# Patient Record
Sex: Male | Born: 1960 | Race: Black or African American | Hispanic: No | Marital: Single | Smoking: Former smoker
Health system: Southern US, Community
[De-identification: ages and names within clinical notes are randomized; demographics above are authoritative.]

## PROBLEM LIST (undated history)

## (undated) DIAGNOSIS — I739 Peripheral vascular disease, unspecified: Secondary | ICD-10-CM

## (undated) DIAGNOSIS — E1151 Type 2 diabetes mellitus with diabetic peripheral angiopathy without gangrene: Secondary | ICD-10-CM

## (undated) DIAGNOSIS — K219 Gastro-esophageal reflux disease without esophagitis: Secondary | ICD-10-CM

## (undated) DIAGNOSIS — E785 Hyperlipidemia, unspecified: Secondary | ICD-10-CM

## (undated) DIAGNOSIS — E119 Type 2 diabetes mellitus without complications: Secondary | ICD-10-CM

## (undated) DIAGNOSIS — E1159 Type 2 diabetes mellitus with other circulatory complications: Secondary | ICD-10-CM

## (undated) DIAGNOSIS — F17200 Nicotine dependence, unspecified, uncomplicated: Secondary | ICD-10-CM

## (undated) DIAGNOSIS — I70219 Atherosclerosis of native arteries of extremities with intermittent claudication, unspecified extremity: Secondary | ICD-10-CM

## (undated) DIAGNOSIS — I10 Essential (primary) hypertension: Secondary | ICD-10-CM

## (undated) DIAGNOSIS — S78111A Complete traumatic amputation at level between right hip and knee, initial encounter: Secondary | ICD-10-CM

## (undated) DIAGNOSIS — J45909 Unspecified asthma, uncomplicated: Secondary | ICD-10-CM

## (undated) DIAGNOSIS — Z8719 Personal history of other diseases of the digestive system: Secondary | ICD-10-CM

## (undated) HISTORY — DX: Atherosclerosis of native arteries of extremities with intermittent claudication, unspecified extremity: I70.219

## (undated) HISTORY — DX: Type 2 diabetes mellitus with diabetic peripheral angiopathy without gangrene: E11.51

## (undated) HISTORY — PX: COLONOSCOPY: SHX5424

## (undated) HISTORY — DX: Nicotine dependence, unspecified, uncomplicated: F17.200

## (undated) HISTORY — DX: Hyperlipidemia, unspecified: E78.5

## (undated) HISTORY — DX: Peripheral vascular disease, unspecified: I73.9

## (undated) HISTORY — DX: Type 2 diabetes mellitus with other circulatory complications: E11.59

## (undated) HISTORY — DX: Essential (primary) hypertension: I10

## (undated) HISTORY — DX: Type 2 diabetes mellitus without complications: E11.9

---

## 1997-10-16 ENCOUNTER — Emergency Department (HOSPITAL_COMMUNITY): Admission: EM | Admit: 1997-10-16 | Discharge: 1997-10-16 | Payer: Self-pay | Admitting: Emergency Medicine

## 2010-09-22 ENCOUNTER — Emergency Department (HOSPITAL_COMMUNITY): Payer: Self-pay

## 2010-09-22 ENCOUNTER — Emergency Department (HOSPITAL_COMMUNITY)
Admission: EM | Admit: 2010-09-22 | Discharge: 2010-09-22 | Disposition: A | Discharge status: Home / Self Care | Payer: Self-pay | Attending: Emergency Medicine | Admitting: Emergency Medicine

## 2010-09-22 DIAGNOSIS — I10 Essential (primary) hypertension: Secondary | ICD-10-CM | POA: Insufficient documentation

## 2010-09-22 DIAGNOSIS — R05 Cough: Secondary | ICD-10-CM | POA: Insufficient documentation

## 2010-09-22 DIAGNOSIS — R059 Cough, unspecified: Secondary | ICD-10-CM | POA: Insufficient documentation

## 2010-09-22 DIAGNOSIS — J189 Pneumonia, unspecified organism: Secondary | ICD-10-CM | POA: Insufficient documentation

## 2010-09-22 LAB — DIFFERENTIAL
Basophils Absolute: 0 10*3/uL (ref 0.0–0.1)
Basophils Relative: 1 % (ref 0–1)
Eosinophils Absolute: 0.1 10*3/uL (ref 0.0–0.7)
Lymphs Abs: 2 10*3/uL (ref 0.7–4.0)
Neutrophils Relative %: 57 % (ref 43–77)

## 2010-09-22 LAB — BASIC METABOLIC PANEL
CO2: 24 mEq/L (ref 19–32)
Chloride: 101 mEq/L (ref 96–112)
GFR calc Af Amer: >60 mL/min (ref >60)
Potassium: 3.7 mEq/L (ref 3.5–5.1)
Sodium: 136 mEq/L (ref 135–145)

## 2010-09-22 LAB — CBC
Platelets: 206 10*3/uL (ref 150–400)
RBC: 5.04 MIL/uL (ref 4.22–5.81)
WBC: 6.3 10*3/uL (ref 4.0–10.5)

## 2012-03-27 ENCOUNTER — Encounter (HOSPITAL_COMMUNITY): Payer: Self-pay | Admitting: *Deleted

## 2012-03-27 ENCOUNTER — Emergency Department (HOSPITAL_COMMUNITY)
Admission: EM | Admit: 2012-03-27 | Discharge: 2012-03-27 | Disposition: A | Discharge status: Home / Self Care | Payer: Self-pay | Attending: Emergency Medicine | Admitting: Emergency Medicine

## 2012-03-27 DIAGNOSIS — IMO0001 Reserved for inherently not codable concepts without codable children: Secondary | ICD-10-CM | POA: Insufficient documentation

## 2012-03-27 DIAGNOSIS — M79609 Pain in unspecified limb: Secondary | ICD-10-CM | POA: Insufficient documentation

## 2012-03-27 DIAGNOSIS — F17200 Nicotine dependence, unspecified, uncomplicated: Secondary | ICD-10-CM | POA: Insufficient documentation

## 2012-03-27 DIAGNOSIS — R7309 Other abnormal glucose: Secondary | ICD-10-CM | POA: Insufficient documentation

## 2012-03-27 DIAGNOSIS — I10 Essential (primary) hypertension: Secondary | ICD-10-CM | POA: Insufficient documentation

## 2012-03-27 HISTORY — DX: Essential (primary) hypertension: I10

## 2012-03-27 LAB — URINALYSIS, ROUTINE W REFLEX MICROSCOPIC
Glucose, UA: 100 mg/dL — AB
Leukocytes, UA: NEGATIVE
Nitrite: NEGATIVE
pH: 5 (ref 5.0–8.0)

## 2012-03-27 LAB — POCT I-STAT, CHEM 8
BUN: 10 mg/dL (ref 6–23)
Chloride: 103 mEq/L (ref 96–112)
HCT: 49 % (ref 39.0–52.0)
Sodium: 138 mEq/L (ref 135–145)
TCO2: 27 mmol/L (ref 0–100)

## 2012-03-27 MED ORDER — ENALAPRIL MALEATE 5 MG PO TABS: 5.0000 mg | ORAL_TABLET | Freq: Every day | ORAL | Status: DC

## 2012-03-27 MED ORDER — HYDROCHLOROTHIAZIDE 25 MG PO TABS: 12.5000 mg | ORAL_TABLET | Freq: Every day | ORAL | Status: DC

## 2012-03-27 NOTE — ED Notes (Addendum)
Reports pain to bilateral lower legs and feet x 3 weeks. Denies injury to legs or feet, denies swelling. No acute distress noted at triage. Pt hypertensive at triage, bp 217/129, reports being told about htn in past but does not take any meds.

## 2012-03-27 NOTE — ED Provider Notes (Signed)
History     CSN: [id number]  Arrival date & time 03/27/12  1009   First MD Initiated Contact with Patient 03/27/12 1124      Chief Complaint  Patient presents with  . Foot Pain  . Leg Pain  . Hypertension    (Consider location/radiation/quality/duration/timing/severity/associated sxs/prior treatment) HPI Complains of bilateral foot pain for 3 weeks. Pain occurs when he stands on his feet for prolonged period of time. An improves when he takes his weight off of his feet. Pain some times radiates to both distal legs. He treats himself with an aspirin with relief. He is presently asymptomatic. He denies chest pain denies shortness of breath denies abdominal pain or headaches no other associated symptoms.    Past Medical History  Diagnosis Date  . Hypertension     History reviewed. No pertinent past surgical history. Surgical history negative History reviewed. No pertinent family history.  History  Substance Use Topics  . Smoking status: Not on file  . Smokeless tobacco: Not on file  . Alcohol Use: Yes     Comment: occ   Positive smoker positive alcohol denies drug use   Review of Systems  Constitutional: Negative.   HENT: Negative.   Respiratory: Negative.   Cardiovascular: Negative.   Gastrointestinal: Negative.   Musculoskeletal: Positive for myalgias and arthralgias.       Bilateral lower extremity pain  Skin: Negative.   Neurological: Negative.   Psychiatric/Behavioral: Negative.   All other systems reviewed and are negative.    Allergies  Review of patient's allergies indicates no known allergies.  Home Medications  No current outpatient prescriptions on file.  BP 217/129  Pulse 85  Temp(Src) 97.4 F (36.3 C) (Oral)  Resp 16  SpO2 97%  Physical Exam  Nursing note and vitals reviewed. Constitutional: He is oriented to person, place, and time. He appears well-developed and well-nourished.  HENT:  Head: Normocephalic and atraumatic.  Eyes:  Conjunctivae are normal. Pupils are equal, round, and reactive to light.  Neck: Neck supple. No tracheal deviation present. No thyromegaly present.  Cardiovascular: Normal rate and regular rhythm.   No murmur heard. Pulmonary/Chest: Effort normal and breath sounds normal.  Abdominal: Soft. Bowel sounds are normal. He exhibits no distension. There is no tenderness.  OBese  Musculoskeletal: Normal range of motion. He exhibits no edema and no tenderness.  All 4 extremities without redness swelling or tenderness neurovascularly intact  Neurological: He is alert and oriented to person, place, and time. No cranial nerve deficit. Coordination normal.  Skin: Skin is warm and dry. No rash noted.  Psychiatric: He has a normal mood and affect.    ED Course  Procedures (including critical care time)  Labs Reviewed - No data to display No results found.   No diagnosis found. Results for orders placed during the hospital encounter of 03/27/12  URINALYSIS, ROUTINE W REFLEX MICROSCOPIC      Result Value Range   Color, Urine YELLOW  YELLOW   APPearance CLEAR  CLEAR   Specific Gravity, Urine 1.019  1.005 - 1.030   pH 5.0  5.0 - 8.0   Glucose, UA 100 (*) NEGATIVE mg/dL   Hgb urine dipstick NEGATIVE  NEGATIVE   Bilirubin Urine NEGATIVE  NEGATIVE   Ketones, ur NEGATIVE  NEGATIVE mg/dL   Protein, ur NEGATIVE  NEGATIVE mg/dL   Urobilinogen, UA 0.2  0.0 - 1.0 mg/dL   Nitrite NEGATIVE  NEGATIVE   Leukocytes, UA NEGATIVE  NEGATIVE  POCT I-STAT, CHEM  8      Result Value Range   Sodium 138  135 - 145 mEq/L   Potassium 4.0  3.5 - 5.1 mEq/L   Chloride 103  96 - 112 mEq/L   BUN 10  6 - 23 mg/dL   Creatinine, Ser 1.61  0.50 - 1.35 mg/dL   Glucose, Bld 096 (*) 70 - 99 mg/dL   Calcium, Ion 0.45  4.09 - 1.23 mmol/L   TCO2 27  0 - 100 mmol/L   Hemoglobin 16.7  13.0 - 17.0 g/dL   HCT 81.1  91.4 - 78.2 %   No results found.   1:30 PM patient resting comfortably. MDM  There is no evidence of  hypertensive emergency and patient is asymptomatic from his hypertension.  History pain and leg pain this seemed musculoskeletal in etiology. Plan suggest to insert some and podiatry referral Dr. Charlsie Merles Referral resource guide to get a primary care physician Smoking cessation encouraged Spent 5 minutes counseling person on smoking cessation Prescription enalapril, HCTZ Diagnosis #1 bilateral foot pain #2 hypertension #3 hyperglycemia #4 tobacco abuse        Doug Sou, MD 03/27/12 1343

## 2012-04-04 ENCOUNTER — Emergency Department (INDEPENDENT_AMBULATORY_CARE_PROVIDER_SITE_OTHER)
Admission: EM | Admit: 2012-04-04 | Discharge: 2012-04-04 | Disposition: A | Discharge status: Home / Self Care | Payer: Self-pay | Source: Home / Self Care

## 2012-04-04 ENCOUNTER — Encounter (HOSPITAL_COMMUNITY): Payer: Self-pay

## 2012-04-04 DIAGNOSIS — I10 Essential (primary) hypertension: Secondary | ICD-10-CM

## 2012-04-04 NOTE — ED Notes (Signed)
Hospital follow up- hypertension

## 2012-04-04 NOTE — ED Provider Notes (Signed)
History     CSN: [id number]  Arrival date & time 04/04/12  1507   First MD Initiated Contact with Patient 04/04/12 1515      Chief Complaint  Patient presents with  . Follow-up    (Consider location/radiation/quality/duration/timing/severity/associated sxs/prior treatment) HPI Patient is 52 year old male who presents for regular followup and would like to have refill on blood pressure medicine. He explains he has been taking medicine as prescribed and reports checking blood pressure regularly every day. He explains it usually blood pressure is lower than 140/90. He denies chest pain or shortness of breath, no headaches or visual changes, no neck stiffness, no other systemic concerns, no specific focal neurological weakness.  Past Medical History  Diagnosis Date  . Hypertension     No past surgical history on file.  No significant family medical history  History  Substance Use Topics  . Smoking status: Not on file  . Smokeless tobacco: Not on file  . Alcohol Use: Yes     Comment: occ      Review of Systems Constitutional: Negative for fever, chills, diaphoresis, activity change, appetite change and fatigue.  HENT: Negative for ear pain, nosebleeds, congestion, facial swelling, rhinorrhea, neck pain, neck stiffness and ear discharge.   Eyes: Negative for pain, discharge, redness, itching and visual disturbance.  Respiratory: Negative for cough, choking, chest tightness, shortness of breath, wheezing and stridor.   Cardiovascular: Negative for chest pain, palpitations and leg swelling.  Gastrointestinal: Negative for abdominal distention.  Genitourinary: Negative for dysuria, urgency, frequency, hematuria, flank pain, decreased urine volume, difficulty urinating and dyspareunia.  Musculoskeletal: Negative for back pain, joint swelling, arthralgias and gait problem.  Neurological: Negative for dizziness, tremors, seizures, syncope, facial asymmetry, speech difficulty,  weakness, light-headedness, numbness and headaches.  Hematological: Negative for adenopathy. Does not bruise/bleed easily.  Psychiatric/Behavioral: Negative for hallucinations, behavioral problems, confusion, dysphoric mood, decreased concentration and agitation.    Allergies  Review of patient's allergies indicates no known allergies.  Home Medications   Current Outpatient Rx  Name  Route  Sig  Dispense  Refill  . aspirin 325 MG tablet   Oral   Take 650 mg by mouth daily.         . Cyanocobalamin (VITAMIN B 12 PO)   Oral   Take 1 tablet by mouth daily.         . enalapril (VASOTEC) 5 MG tablet   Oral   Take 1 tablet (5 mg total) by mouth daily.   30 tablet   0   . hydrochlorothiazide (HYDRODIURIL) 25 MG tablet   Oral   Take 0.5 tablets (12.5 mg total) by mouth daily.   15 tablet   0    BP = 174/94, HR 74, O2 sat 100%  Physical Exam  Constitutional: Appears well-developed and well-nourished. No distress.  HENT: Normocephalic. External right and left ear normal. Oropharynx is clear and moist.  Eyes: Conjunctivae and EOM are normal. PERRLA, no scleral icterus.  Neck: Normal ROM. Neck supple. No JVD. No tracheal deviation. No thyromegaly.  CVS: RRR, S1/S2 +, no murmurs, no gallops, no carotid bruit.  Pulmonary: Effort and breath sounds normal, no stridor, rhonchi, wheezes, rales.  Abdominal: Soft. BS +,  no distension, tenderness, rebound or guarding.  Musculoskeletal: Normal range of motion. No edema and no tenderness.  Lymphadenopathy: No lymphadenopathy noted, cervical, inguinal. Neuro: Alert. Normal reflexes, muscle tone coordination. No cranial nerve deficit. Skin: Skin is warm and dry. No rash noted. Not diaphoretic. No  erythema. No pallor.  Psychiatric: Normal mood and affect. Behavior, judgment, thought content normal.    ED Course  Procedures (including critical care time)  Labs Reviewed - No data to display No results found.   1. HTN (hypertension)      - appears to be improving since last visit - we have discussed medical compliance  - I have advised patient to check blood pressure regularly and to call us back if the numbers are higher than 150/90, he verbalized understanding  MDM  HTN, uncontrolled         Dorothea Ogle, MD 04/04/12 1525

## 2012-04-21 ENCOUNTER — Encounter (HOSPITAL_COMMUNITY): Payer: Self-pay

## 2012-04-21 ENCOUNTER — Emergency Department (HOSPITAL_COMMUNITY)
Admission: EM | Admit: 2012-04-21 | Discharge: 2012-04-21 | Disposition: A | Discharge status: Home / Self Care | Payer: No Typology Code available for payment source | Source: Home / Self Care

## 2012-04-21 DIAGNOSIS — Z720 Tobacco use: Secondary | ICD-10-CM

## 2012-04-21 DIAGNOSIS — I739 Peripheral vascular disease, unspecified: Secondary | ICD-10-CM

## 2012-04-21 DIAGNOSIS — I10 Essential (primary) hypertension: Secondary | ICD-10-CM

## 2012-04-21 DIAGNOSIS — F17200 Nicotine dependence, unspecified, uncomplicated: Secondary | ICD-10-CM

## 2012-04-21 MED ORDER — HYDROCHLOROTHIAZIDE 25 MG PO TABS: 25.0000 mg | ORAL_TABLET | Freq: Every day | ORAL | Status: DC

## 2012-04-21 MED ORDER — ENALAPRIL MALEATE 5 MG PO TABS: 5.0000 mg | ORAL_TABLET | Freq: Every day | ORAL | Status: DC

## 2012-04-21 MED ORDER — ASPIRIN 81 MG PO TABS: 81.0000 mg | ORAL_TABLET | Freq: Every day | ORAL | Status: DC

## 2012-04-21 MED ORDER — POTASSIUM CHLORIDE ER 10 MEQ PO TBCR: 10.0000 meq | EXTENDED_RELEASE_TABLET | Freq: Every day | ORAL | Status: DC

## 2012-04-21 NOTE — Progress Notes (Signed)
Patient Demographics  Javier Quinn, is a 52 y.o. male  ZOX:[id number]  WJX:[id number]  DOB - 1960-11-14  Chief Complaint  Patient presents with  . Foot Pain        Subjective:   Javier Quinn today is here for a follow up vis. He has been experiencing bilateral lower extremity pain for approximately 2 or 3 months. Pain is exclusively upon walking, he then has to sit down and relax and the pain slowly resolves.  Patient has No headache, No chest pain, No abdominal pain - No Nausea, No new weakness tingling or numbness, No Cough - SOB.   Objective:    Filed Vitals:   04/21/12 1558  BP: 156/92  Pulse: 85  Temp: 97 F (36.1 C)  TempSrc: Oral  Resp: 16  SpO2: 98%     ALLERGIES:  No Known Allergies  PAST MEDICAL HISTORY: Past Medical History  Diagnosis Date  . Hypertension     MEDICATIONS AT HOME: Prior to Admission medications   Medication Sig Start Date End Date Taking? Authorizing Provider  aspirin 81 MG tablet Take 1 tablet (81 mg total) by mouth daily. 04/21/12   Kiwanna Spraker Levora Dredge, MD  Cyanocobalamin (VITAMIN B 12 PO) Take 1 tablet by mouth daily.    Historical Provider, MD  enalapril (VASOTEC) 5 MG tablet Take 1 tablet (5 mg total) by mouth daily. 04/21/12   Chiquita Heckert Levora Dredge, MD  hydrochlorothiazide (HYDRODIURIL) 25 MG tablet Take 1 tablet (25 mg total) by mouth daily. 04/21/12   Ephraim Reichel Levora Dredge, MD  potassium chloride (K-DUR) 10 MEQ tablet Take 1 tablet (10 mEq total) by mouth daily. 04/21/12   Celes Dedic Levora Dredge, MD     Exam  General appearance :Awake, alert, not in any distress. Speech Clear. Not toxic Looking HEENT: Atraumatic and Normocephalic, pupils equally reactive to light and accomodation Neck: supple, no JVD. No cervical lymphadenopathy.  Chest:Good air entry bilaterally, no added sounds  CVS: S1 S2 regular, no murmurs.  Abdomen: Bowel sounds present, Non tender and not distended with no gaurding, rigidity or rebound. Extremities: B/L Lower Ext  shows no edema, both legs are warm to touch Neurology: Awake alert, and oriented X 3, CN II-XII intact, Non focal Skin:No Rash Wounds:N/A    Data Review   CBC No results found for this basename: WBC, HGB, HCT, PLT, MCV, MCH, MCHC, RDW, NEUTRABS, LYMPHSABS, MONOABS, EOSABS, BASOSABS, BANDABS, BANDSABD,  in the last 168 hours  Chemistries   No results found for this basename: NA, K, CL, CO2, GLUCOSE, BUN, CREATININE, GFRCGP, CALCIUM, MG, AST, ALT, ALKPHOS, BILITOT,  in the last 168 hours ------------------------------------------------------------------------------------------------------------------ No results found for this basename: HGBA1C,  in the last 72 hours ------------------------------------------------------------------------------------------------------------------ No results found for this basename: CHOL, HDL, LDLCALC, TRIG, CHOLHDL, LDLDIRECT,  in the last 72 hours ------------------------------------------------------------------------------------------------------------------ No results found for this basename: TSH, T4TOTAL, FREET3, T3FREE, THYROIDAB,  in the last 72 hours ------------------------------------------------------------------------------------------------------------------ No results found for this basename: VITAMINB12, FOLATE, FERRITIN, TIBC, IRON, RETICCTPCT,  in the last 72 hours  Coagulation profile  No results found for this basename: INR, PROTIME,  in the last 168 hours    Assessment & Plan   Active Problems: #1 bilateral lower extremity pain -? Claudication-not sure whether this is vascular or neurogenic (he does claim to have some back pain as well) - Will start by getting ABI and x-ray of his lumbar spine - Check A1c, lipid and electrolytes-and return to clinic in 2 weeks  #2 hypertension -  Uncontrolled - Increase HCTZ to 25 mg, add KCl - Continue with lisinopril - Check chemistries- return visit  2 weeks  #3 tobacco use - Counseled  extensively against further use. He claims understanding    Follow-up Information   Follow up with [facility]. Schedule an appointment as soon as possible for a visit in 2 weeks.

## 2012-04-21 NOTE — ED Notes (Signed)
Patient states both feet and legs have been bothering  Him past couple of months.States can only wallk about a half Of block and has to sit down

## 2012-04-22 ENCOUNTER — Ambulatory Visit (HOSPITAL_COMMUNITY)
Admission: RE | Admit: 2012-04-22 | Discharge: 2012-04-22 | Disposition: A | Discharge status: Home / Self Care | Payer: No Typology Code available for payment source | Source: Ambulatory Visit | Attending: Internal Medicine | Admitting: Internal Medicine

## 2012-04-22 ENCOUNTER — Telehealth (HOSPITAL_COMMUNITY): Payer: Self-pay

## 2012-04-22 DIAGNOSIS — M545 Low back pain, unspecified: Secondary | ICD-10-CM | POA: Insufficient documentation

## 2012-04-22 DIAGNOSIS — M25559 Pain in unspecified hip: Secondary | ICD-10-CM | POA: Insufficient documentation

## 2012-04-22 DIAGNOSIS — I700 Atherosclerosis of aorta: Secondary | ICD-10-CM | POA: Insufficient documentation

## 2012-04-22 NOTE — ED Notes (Signed)
Patient has an appt tues 04/26/12 at vascular lab for ABI Patient was notified to be in admitting at 11 am Procedure is scheduled for 11:30 am Spoke with [facility] in vascular

## 2012-04-26 ENCOUNTER — Emergency Department (HOSPITAL_COMMUNITY)
Admission: EM | Admit: 2012-04-26 | Discharge: 2012-04-26 | Disposition: A | Discharge status: Home / Self Care | Payer: No Typology Code available for payment source | Source: Home / Self Care

## 2012-04-26 ENCOUNTER — Ambulatory Visit (HOSPITAL_COMMUNITY)
Admission: RE | Admit: 2012-04-26 | Discharge: 2012-04-26 | Disposition: A | Discharge status: Home / Self Care | Payer: No Typology Code available for payment source | Source: Ambulatory Visit | Attending: Internal Medicine | Admitting: Internal Medicine

## 2012-04-26 DIAGNOSIS — Z87891 Personal history of nicotine dependence: Secondary | ICD-10-CM | POA: Insufficient documentation

## 2012-04-26 DIAGNOSIS — I70219 Atherosclerosis of native arteries of extremities with intermittent claudication, unspecified extremity: Secondary | ICD-10-CM

## 2012-04-26 NOTE — Progress Notes (Signed)
VASCULAR LAB PRELIMINARY  ARTERIAL  ABI completed:    RIGHT    LEFT    PRESSURE WAVEFORM  PRESSURE WAVEFORM  BRACHIAL 171 Triphasic  BRACHIAL 169 Triphasic   DP 82 Severely dampened DP  Absent   AT   AT    PT  Absent  PT  Severely dampened  PER 85 Severely dampened PER 91 Severely dampened  GREAT TOE  NA GREAT TOE  NA    RIGHT LEFT  ABI 0.5 0.61     Alianah Lofton, RVT 04/26/2012, 4:14 PM

## 2012-04-28 ENCOUNTER — Ambulatory Visit (HOSPITAL_COMMUNITY): Payer: No Typology Code available for payment source

## 2012-04-29 ENCOUNTER — Encounter (HOSPITAL_COMMUNITY): Payer: Self-pay

## 2012-04-29 ENCOUNTER — Emergency Department (INDEPENDENT_AMBULATORY_CARE_PROVIDER_SITE_OTHER)
Admission: EM | Admit: 2012-04-29 | Discharge: 2012-04-29 | Disposition: A | Discharge status: Home Health | Payer: No Typology Code available for payment source | Source: Home / Self Care | Attending: Family Medicine | Admitting: Family Medicine

## 2012-04-29 DIAGNOSIS — R7309 Other abnormal glucose: Secondary | ICD-10-CM

## 2012-04-29 DIAGNOSIS — I739 Peripheral vascular disease, unspecified: Secondary | ICD-10-CM

## 2012-04-29 DIAGNOSIS — R739 Hyperglycemia, unspecified: Secondary | ICD-10-CM | POA: Diagnosis present

## 2012-04-29 LAB — COMPREHENSIVE METABOLIC PANEL
ALT: 32 U/L (ref 0–53)
AST: 26 U/L (ref 0–37)
Alkaline Phosphatase: 93 U/L (ref 39–117)
CO2: 28 mEq/L (ref 19–32)
GFR calc Af Amer: >90 mL/min (ref >90)
Glucose, Bld: 175 mg/dL — ABNORMAL HIGH (ref 70–99)
Potassium: 4.2 mEq/L (ref 3.5–5.1)
Sodium: 137 mEq/L (ref 135–145)
Total Protein: 7.7 g/dL (ref 6.0–8.3)

## 2012-04-29 LAB — LIPID PANEL
HDL: 55 mg/dL (ref >39)
LDL Cholesterol: UNDETERMINED mg/dL (ref 0–99)
Total CHOL/HDL Ratio: 7.3 RATIO

## 2012-04-29 LAB — HEMOGLOBIN A1C
Hgb A1c MFr Bld: 9.2 % — ABNORMAL HIGH (ref <5.7)
Mean Plasma Glucose: 217 mg/dL — ABNORMAL HIGH (ref <117)

## 2012-04-29 NOTE — ED Notes (Signed)
Patient states has been having sever leg pain Has gotten worse

## 2012-04-29 NOTE — ED Provider Notes (Signed)
History     CSN: [id number]  Arrival date & time 04/29/12  1024   First MD Initiated Contact with Patient 04/29/12 1148      Chief Complaint  Patient presents with  . Leg Pain   (Consider location/radiation/quality/duration/timing/severity/associated sxs/prior treatment) HPI Pt says that he is still having significant pain in both lower extremities.  His ABI tests reveal that he has severe PVD.  Pt says that he is having some significant pain and not able to ambulate well.  He can't sleep well because of the pain. Pt says that pain is mostly present in right foot and right leg.    Past Medical History  Diagnosis Date  . Hypertension     History reviewed. No pertinent past surgical history.  No family history on file.  History  Substance Use Topics  . Smoking status: Not on file  . Smokeless tobacco: Not on file  . Alcohol Use: Yes     Comment: occ    Review of Systems Constitutional: Negative.  HENT: Negative.  Respiratory: Negative.  Cardiovascular: Negative.  Gastrointestinal: Negative.  Endocrine: Negative.  Genitourinary: Negative.  Musculoskeletal: pain in right foot and leg with any activity  Skin: Negative.  Allergic/Immunologic: Negative.  Neurological: Negative.  Hematological: Negative.  Psychiatric/Behavioral: Negative.  All other systems reviewed and are negative  Allergies  Review of patient's allergies indicates no known allergies.  Home Medications   Current Outpatient Rx  Name  Route  Sig  Dispense  Refill  . aspirin 81 MG tablet   Oral   Take 1 tablet (81 mg total) by mouth daily.         . Cyanocobalamin (VITAMIN B 12 PO)   Oral   Take 1 tablet by mouth daily.         . enalapril (VASOTEC) 5 MG tablet   Oral   Take 1 tablet (5 mg total) by mouth daily.   30 tablet   0   . hydrochlorothiazide (HYDRODIURIL) 25 MG tablet   Oral   Take 1 tablet (25 mg total) by mouth daily.   30 tablet   0   . potassium chloride (K-DUR) 10  MEQ tablet   Oral   Take 1 tablet (10 mEq total) by mouth daily.   30 tablet   0     BP 161/106  Pulse 81  Temp(Src) 97.9 F (36.6 C) (Oral)  Resp 17  SpO2 99%  Physical Exam Nursing note and vitals reviewed.  Constitutional: He is oriented to person, place, and time. He appears well-developed and well-nourished. No distress.  Eyes: Conjunctivae and EOM are normal. Pupils are equal, round, and reactive to light.  Neck: Normal range of motion. Neck supple. No JVD present. No thyromegaly present.  Cardiovascular: Normal rate, regular rhythm and normal heart sounds.  No murmur heard.  Pulmonary/Chest: Effort normal and breath sounds normal. No respiratory distress.  Abdominal: Soft. Bowel sounds are normal.  Musculoskeletal: diminished pulses in LEs, warm feet and legs but no pulse palpated in right foot  Lymphadenopathy:  He has no cervical adenopathy.  Neurological: He is oriented to person, place, and time. Coordination normal.  Skin: Skin is warm and dry. No rash noted. No erythema. No pallor.  Psychiatric: He has a normal mood and affect. His behavior is normal. Judgment and thought content normal.   ED Course  Procedures (including critical care time)  Labs Reviewed  COMPREHENSIVE METABOLIC PANEL  LIPID PANEL  HEMOGLOBIN A1C  TSH  No results found.   No diagnosis found.  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  I called and spoke with the vascular surgeon on call Dr. Hart Rochester about the patient.  He says that the patient needs to be seen in his office next week and he will call patient and get him set up to be seen in the office.  He took pt's information to get him in next week.  He says that patient will likely need angiogram done next week.  I reviewed ABI results with patient and with Dr. Hart Rochester.  I  strongly advised against continued smoking cessation.  Pt says that he will not start smoking again.  Dr. Hart Rochester says that he can use tylenol for pain control.   Continue HCT and enalapril for now but likely will titrate.   I strongly suspect that pt has untreated diabetes and dyslipidemia. I ordered to check A1c, lipids and TSH,  Will try to get the results and decide about further treatment pending the results.    FOLLOW UP 2 weeks for recheck   The patient was given clear instructions to go to ER or return to medical center if symptoms don't improve, worsen or new problems develop.  The patient verbalized understanding.  The patient was told to call to get lab results if they haven't heard anything in the next week.            Cleora Fleet, MD 04/29/12 1228

## 2012-05-01 ENCOUNTER — Encounter: Payer: Self-pay | Admitting: Family Medicine

## 2012-05-01 DIAGNOSIS — Z87891 Personal history of nicotine dependence: Secondary | ICD-10-CM | POA: Insufficient documentation

## 2012-05-01 DIAGNOSIS — E119 Type 2 diabetes mellitus without complications: Secondary | ICD-10-CM | POA: Insufficient documentation

## 2012-05-01 DIAGNOSIS — E1165 Type 2 diabetes mellitus with hyperglycemia: Secondary | ICD-10-CM | POA: Insufficient documentation

## 2012-05-01 NOTE — Progress Notes (Signed)
Quick Note:  VERY IMPORTANT. Please inform patient that he does have uncontrolled diabetes mellitus. He also has uncontrolled high cholesterol. He needs to start medicine for his diabetes and his cholesterol. Please call him in prescription to start Metformin ER 500 mg tabs, take 1 po daily with breakfast for 1 week, then take 1 po BIDWC for 2 weeks, then take 2 tabs po BIDAC as the final dose, Disp: #120 tabs, RF x 3, Also for his cholesterol, Please call in pravastatin 80 mg po every evening, #30, RFx2. I recommend that he get established with the [employer] so that he can start getting Crestor free from the company. This is a much stronger cholesterol medication but is very expensive. For now, take the pravastatin until he can get the Crestor. Have him follow up to see doctor in next 2 weeks (he should already have appt). Also, please have him make sure to follow up with the vascular surgeon this week.   Rodney Langton, MD, CDE, FAAFP [facility] [place], [place]   ______

## 2012-05-03 ENCOUNTER — Encounter: Payer: No Typology Code available for payment source | Admitting: Vascular Surgery

## 2012-05-03 ENCOUNTER — Telehealth (HOSPITAL_COMMUNITY): Payer: Self-pay

## 2012-05-03 NOTE — ED Notes (Signed)
Tried to call patient with labs Not available left message to return our call So i can get pharmacy and call in his medicatioins

## 2012-05-10 ENCOUNTER — Encounter: Payer: Self-pay | Admitting: Vascular Surgery

## 2012-05-11 ENCOUNTER — Ambulatory Visit (INDEPENDENT_AMBULATORY_CARE_PROVIDER_SITE_OTHER): Payer: No Typology Code available for payment source | Admitting: Vascular Surgery

## 2012-05-11 ENCOUNTER — Encounter: Payer: Self-pay | Admitting: Vascular Surgery

## 2012-05-11 ENCOUNTER — Other Ambulatory Visit: Payer: Self-pay

## 2012-05-11 VITALS — BP 144/101 | HR 94 | Resp 18 | Ht 72.0 in | Wt 224.0 lb

## 2012-05-11 DIAGNOSIS — M79671 Pain in right foot: Secondary | ICD-10-CM

## 2012-05-11 DIAGNOSIS — M79672 Pain in left foot: Secondary | ICD-10-CM | POA: Insufficient documentation

## 2012-05-11 DIAGNOSIS — M7989 Other specified soft tissue disorders: Secondary | ICD-10-CM

## 2012-05-11 DIAGNOSIS — M79609 Pain in unspecified limb: Secondary | ICD-10-CM

## 2012-05-11 DIAGNOSIS — M25579 Pain in unspecified ankle and joints of unspecified foot: Secondary | ICD-10-CM | POA: Insufficient documentation

## 2012-05-11 MED ORDER — ATORVASTATIN CALCIUM 10 MG PO TABS: 10.0000 mg | ORAL_TABLET | Freq: Every day | ORAL | Status: DC

## 2012-05-11 NOTE — Progress Notes (Signed)
[facility] [place]  Referred by:  Nyliah Nierenberg L Latorsha Curling, MD [address] [place], [place] [address]  Reason for referral: severe PAD in both legs  History of Present Illness  Javier Quinn is a 51 y.o. (01/25/1960) male who presents with chief complaint: aching in feet with very short distance ambulation.  Onset of symptom occurred one month ago.  Pain is described as aching in both feet, severity 8-10/10, and associated with any ambulation.  Patient has attempted to treat this pain with rest and narcotics.  The patient has rest pain symptoms also and no leg wounds/ulcers.  Atherosclerotic risk factors include: uncontrolled hyperlipidemia, hypertension, diabetes, and history of tobacco dependence (recently quit).  Past Medical History  Diagnosis Date  . Hypertension   . Diabetes mellitus without complication   . Hyperlipidemia   . Tobacco dependence     PSH Denies any procedures  History   Social History  . Marital Status: Single    Spouse Name: N/A    Number of Children: N/A  . Years of Education: N/A   Occupational History  . Not on file.   Social History Main Topics  . Smoking status: Former Smoker    Quit date: 04/11/2012  . Smokeless tobacco: Never Used  . Alcohol Use: Yes     Comment: occ  . Drug Use: No  . Sexually Active: Not on file   Other Topics Concern  . Not on file   Social History Narrative  . No narrative on file    Family History  Problem Relation Age of Onset  . Cancer Mother     pancreas  . Cancer Father     Current Outpatient Prescriptions on File Prior to Visit  Medication Sig Dispense Refill  . aspirin 81 MG tablet Take 1 tablet (81 mg total) by mouth daily.      . enalapril (VASOTEC) 5 MG tablet Take 1 tablet (5 mg total) by mouth daily.  30 tablet  0  . hydrochlorothiazide (HYDRODIURIL) 25 MG tablet Take 1 tablet (25 mg total) by mouth daily.  30 tablet  0  . potassium chloride (K-DUR) 10 MEQ tablet Take 1 tablet (10 mEq  total) by mouth daily.  30 tablet  0   No current facility-administered medications on file prior to visit.    No Known Allergies  REVIEW OF SYSTEMS:  (Positives checked otherwise negative)  CARDIOVASCULAR:  [] chest pain, [] chest pressure, [] palpitations, [] shortness of breath when laying flat, [] shortness of breath with exertion,  [x] pain in feet when walking, [x] pain in feet when laying flat, [] history of blood clot in veins (DVT), [] history of phlebitis, [] swelling in legs, [] varicose veins  PULMONARY:  [] productive cough, [] asthma, [] wheezing  NEUROLOGIC:  [x] weakness in arms or legs, [x] numbness in arms or legs, [] difficulty speaking or slurred speech, [] temporary loss of vision in one eye, [] dizziness  HEMATOLOGIC:  [] bleeding problems, [] problems with blood clotting too easily  MUSCULOSKEL:  [] joint pain, [] joint swelling  GASTROINTEST:  [] vomiting blood, [] blood in stool     GENITOURINARY:  [] burning with urination, [] blood in urine  PSYCHIATRIC:  [] history of major depression  INTEGUMENTARY:  [] rashes, [] ulcers  CONSTITUTIONAL:  [] fever, [] chills  For VQI Use Only   PRE-ADM LIVING: Home  AMB STATUS: Ambulatory  CAD Sx: None  PRIOR CHF:   None  STRESS TEST: [x] No, [ ] Normal, [ ] + ischemia, [ ] + MI, [ ] Both  Physical Examination  Filed Vitals:   05/11/12 1516  BP: 144/101  Pulse: 94  Resp: 18  Height: 6' (1.829 m)  Weight: 224 lb (101.606 kg)    Body mass index is 30.37 kg/(m^2).  General: A&O x 3, WD, mildly obese  Head: [place]/AT  Ear/Nose/Throat: Hearing grossly intact, nares w/o erythema or drainage, oropharynx w/o Erythema/Exudate, Mallampati score: 3  Eyes: PERRLA, EOMI  Neck: Supple, no nuchal rigidity, no palpable LAD  Pulmonary: Sym exp, good air movt, CTAB, no rales, rhonchi, & wheezing  Cardiac: RRR, Nl S1, S2, no Murmurs, rubs or gallops  Vascular: Vessel Right Left  Radial Palpable Palpable   Ulnar Not Palpable notPalpable  Brachial Palpable Palpable  Carotid Palpable, without bruit Palpable, without bruit  Aorta Not palpable N/A  Femoral Not Palpable Faintly Palpable  Popliteal Not palpable Not palpable  PT Not Palpable Not Palpable  DP Not Palpable Not Palpable   Gastrointestinal: soft, NTND, -G/R, - HSM, - masses, - CVAT B  Musculoskeletal: M/S 5/5 throughout , Extremities without ischemic changes , cool feet, no obvious gangrene  Neurologic: CN 2-12 intact , Pain and light touch intact in extremities except toe tips which have decreased sensation, Motor exam as listed above  Psychiatric: Judgment intact, Mood & affect appropriate for pt's clinical situation  Dermatologic: See M/S exam for extremity exam, no rashes otherwise noted  Lymph : No Cervical, Axillary, or Inguinal lymphadenopathy   Non-Invasive Vascular Imaging  Outside ABI (Date: 04/26/12)  R: 048, Peroneal: severely dampened mono, PT: absent, AT: severely dampened mono  L: 0.62, Peroneal: severely dampened mono, PT: severely dampened mono, AT: absent  Outside Studies/Documentation 4 pages of outside documents were reviewed including: recent ED chart.  Laboratory: CBC:    Component Value Date/Time   WBC 6.3 09/22/2010 1107   RBC 5.04 09/22/2010 1107   HGB 16.7 03/27/2012 1233   HCT 49.0 03/27/2012 1233   PLT 206 09/22/2010 1107   MCV 85.9 09/22/2010 1107   MCH 29.6 09/22/2010 1107   MCHC 34.4 09/22/2010 1107   RDW 13.3 09/22/2010 1107   LYMPHSABS 2.0 09/22/2010 1107   MONOABS 0.6 09/22/2010 1107   EOSABS 0.1 09/22/2010 1107   BASOSABS 0.0 09/22/2010 1107    BMP:    Component Value Date/Time   NA 137 04/29/2012 1157   K 4.2 04/29/2012 1157   CL 99 04/29/2012 1157   CO2 28 04/29/2012 1157   GLUCOSE 175* 04/29/2012 1157   BUN 11 04/29/2012 1157   CREATININE 0.84 04/29/2012 1157   CALCIUM 10.3 04/29/2012 1157   GFRNONAA >90 04/29/2012 1157   GFRAA >90 04/29/2012 1157   Lipid Panel     Component Value  Date/Time   CHOL 400* 04/29/2012 1157   TRIG 608* 04/29/2012 1157   HDL 55 04/29/2012 1157   CHOLHDL 7.3 04/29/2012 1157   VLDL UNABLE TO CALCULATE IF TRIGLYCERIDE OVER 400 mg/dL 04/29/2012 1157   LDLCALC UNABLE TO CALCULATE IF TRIGLYCERIDE OVER 400 mg/dL 04/29/2012 1157   Coagulation: No results found for this basename: INR, PROTIME   No results found for this basename: PTT   Medical Decision Making  Javier Quinn is a 51 y.o. male who presents with: BLE critical limb ischemia.   I discussed with the patient the natural history of critical limb ischemia: 25% require amputation in one year, 50% are able to   maintain their limbs in one year, and 25-30% die in one year due to comorbidities.  Given the limb threatening status of this patient, I recommend an aggressive work up including proceeding with an: Aortogram, Bilateral runoff and intervention. I discussed with the patient the nature of angiographic procedures, especially the limited patencies of any endovascular intervention. The patient is aware of that the risks of an angiographic procedure include but are not limited to: bleeding, infection, access site complications, embolization, rupture of treated vessel, dissection, possible need for emergent surgical intervention, and possible need for surgical procedures to treat the patient's pathology. The patient is aware of the risks and agrees to proceed.  The procedure is scheduled for: Aortogram, bilateral runoff, possible intervention, possible brachial access for tomorrow, 05/12/12.  I discussed in depth with the patient the nature of atherosclerosis, and emphasized the importance of maximal medical management including strict control of blood pressure, blood glucose, and lipid levels, antiplatelet agents, obtaining regular exercise, and cessation of smoking.  The patient is aware that without maximal medical management the underlying atherosclerotic disease process will progress, limiting the  benefit of any interventions. The patient is currently not on a statin.  The patient will be started on Lipitor 10 mg PO daily, to be titrated and managed by their primary care physician.   The patient is currently on an anti-platelet: ASA.  Thank you for allowing us to participate in this patient's care.  Audianna Landgren, MD [facility] Office: [phone] Pager: [phone]  05/11/2012, 4:17 PM     

## 2012-05-12 ENCOUNTER — Encounter (HOSPITAL_COMMUNITY)
Admission: RE | Disposition: A | Discharge status: Home / Self Care | Payer: Self-pay | Source: Ambulatory Visit | Attending: Vascular Surgery

## 2012-05-12 ENCOUNTER — Other Ambulatory Visit: Payer: Self-pay | Admitting: *Deleted

## 2012-05-12 ENCOUNTER — Ambulatory Visit (HOSPITAL_COMMUNITY)
Admission: RE | Admit: 2012-05-12 | Discharge: 2012-05-12 | Disposition: A | Discharge status: Home / Self Care | Payer: No Typology Code available for payment source | Source: Ambulatory Visit | Attending: Vascular Surgery | Admitting: Vascular Surgery

## 2012-05-12 DIAGNOSIS — I7092 Chronic total occlusion of artery of the extremities: Secondary | ICD-10-CM | POA: Insufficient documentation

## 2012-05-12 DIAGNOSIS — E119 Type 2 diabetes mellitus without complications: Secondary | ICD-10-CM | POA: Insufficient documentation

## 2012-05-12 DIAGNOSIS — I10 Essential (primary) hypertension: Secondary | ICD-10-CM | POA: Insufficient documentation

## 2012-05-12 DIAGNOSIS — I70229 Atherosclerosis of native arteries of extremities with rest pain, unspecified extremity: Secondary | ICD-10-CM | POA: Insufficient documentation

## 2012-05-12 DIAGNOSIS — I70219 Atherosclerosis of native arteries of extremities with intermittent claudication, unspecified extremity: Secondary | ICD-10-CM

## 2012-05-12 DIAGNOSIS — E785 Hyperlipidemia, unspecified: Secondary | ICD-10-CM | POA: Insufficient documentation

## 2012-05-12 DIAGNOSIS — E669 Obesity, unspecified: Secondary | ICD-10-CM | POA: Insufficient documentation

## 2012-05-12 DIAGNOSIS — Z7982 Long term (current) use of aspirin: Secondary | ICD-10-CM | POA: Insufficient documentation

## 2012-05-12 DIAGNOSIS — Z6830 Body mass index (BMI) 30.0-30.9, adult: Secondary | ICD-10-CM | POA: Insufficient documentation

## 2012-05-12 DIAGNOSIS — F17200 Nicotine dependence, unspecified, uncomplicated: Secondary | ICD-10-CM | POA: Insufficient documentation

## 2012-05-12 DIAGNOSIS — Z79899 Other long term (current) drug therapy: Secondary | ICD-10-CM | POA: Insufficient documentation

## 2012-05-12 DIAGNOSIS — Z01810 Encounter for preprocedural cardiovascular examination: Secondary | ICD-10-CM

## 2012-05-12 DIAGNOSIS — I739 Peripheral vascular disease, unspecified: Secondary | ICD-10-CM

## 2012-05-12 HISTORY — PX: ABDOMINAL AORTAGRAM: SHX5454

## 2012-05-12 HISTORY — PX: LOWER EXTREMITY ANGIOGRAM: SHX5508

## 2012-05-12 LAB — POCT I-STAT, CHEM 8
BUN: 20 mg/dL (ref 6–23)
Chloride: 104 mEq/L (ref 96–112)
Creatinine, Ser: 1 mg/dL (ref 0.50–1.35)
Hemoglobin: 15.6 g/dL (ref 13.0–17.0)
Potassium: 5.1 mEq/L (ref 3.5–5.1)
Sodium: 132 mEq/L — ABNORMAL LOW (ref 135–145)

## 2012-05-12 SURGERY — ABDOMINAL AORTAGRAM
Anesthesia: LOCAL

## 2012-05-12 MED ORDER — OXYCODONE-ACETAMINOPHEN 5-325 MG PO TABS: 1.0000 | ORAL_TABLET | ORAL | Status: DC | PRN

## 2012-05-12 MED ORDER — LIDOCAINE HCL (PF) 1 % IJ SOLN
INTRAMUSCULAR | Status: AC
  Filled 2012-05-12: qty 30

## 2012-05-12 MED ORDER — SODIUM CHLORIDE 0.9 % IV SOLN
INTRAVENOUS | Status: DC
  Administered 2012-05-12: 12:00:00 via INTRAVENOUS

## 2012-05-12 MED ORDER — SODIUM CHLORIDE 0.9 % IV SOLN: 1.0000 mL/kg/h | INTRAVENOUS | Status: DC

## 2012-05-12 MED ORDER — FENTANYL CITRATE 0.05 MG/ML IJ SOLN
INTRAMUSCULAR | Status: AC
  Filled 2012-05-12: qty 2

## 2012-05-12 MED ORDER — MIDAZOLAM HCL 2 MG/2ML IJ SOLN
INTRAMUSCULAR | Status: AC
  Filled 2012-05-12: qty 2

## 2012-05-12 MED ORDER — MORPHINE SULFATE 2 MG/ML IJ SOLN: 2.0000 mg | INTRAMUSCULAR | Status: DC | PRN

## 2012-05-12 MED ORDER — HEPARIN (PORCINE) IN NACL 2-0.9 UNIT/ML-% IJ SOLN
INTRAMUSCULAR | Status: AC
  Filled 2012-05-12: qty 500

## 2012-05-12 NOTE — Interval H&P Note (Signed)
[facility] [place]  History and Physical Update  The patient was interviewed and re-examined.  The patient's previous History and Physical has been reviewed and is unchanged from my consult yesterday.  There is no change in the plan of care: aortogram, bilateral leg runoff, possible intervention, and possible brachial access.  Leonides Sake, MD [facility] [place] Office: [phone] Pager: [phone]  05/12/2012, 10:20 AM

## 2012-05-12 NOTE — H&P (View-Only) (Signed)
[facility] [place]  Referred by:  Fransisco Hertz, MD [address] [place], [place] [address]  Reason for referral: severe PAD in both legs  History of Present Illness  Javier Quinn is a 52 y.o. (18-Apr-1960) male who presents with chief complaint: aching in feet with very short distance ambulation.  Onset of symptom occurred one month ago.  Pain is described as aching in both feet, severity 8-10/10, and associated with any ambulation.  Patient has attempted to treat this pain with rest and narcotics.  The patient has rest pain symptoms also and no leg wounds/ulcers.  Atherosclerotic risk factors include: uncontrolled hyperlipidemia, hypertension, diabetes, and history of tobacco dependence (recently quit).  Past Medical History  Diagnosis Date  . Hypertension   . Diabetes mellitus without complication   . Hyperlipidemia   . Tobacco dependence     PSH Denies any procedures  History   Social History  . Marital Status: Single    Spouse Name: N/A    Number of Children: N/A  . Years of Education: N/A   Occupational History  . Not on file.   Social History Main Topics  . Smoking status: Former Smoker    Quit date: 04/11/2012  . Smokeless tobacco: Never Used  . Alcohol Use: Yes     Comment: occ  . Drug Use: No  . Sexually Active: Not on file   Other Topics Concern  . Not on file   Social History Narrative  . No narrative on file    Family History  Problem Relation Age of Onset  . Cancer Mother     pancreas  . Cancer Father     Current Outpatient Prescriptions on File Prior to Visit  Medication Sig Dispense Refill  . aspirin 81 MG tablet Take 1 tablet (81 mg total) by mouth daily.      . enalapril (VASOTEC) 5 MG tablet Take 1 tablet (5 mg total) by mouth daily.  30 tablet  0  . hydrochlorothiazide (HYDRODIURIL) 25 MG tablet Take 1 tablet (25 mg total) by mouth daily.  30 tablet  0  . potassium chloride (K-DUR) 10 MEQ tablet Take 1 tablet (10 mEq  total) by mouth daily.  30 tablet  0   No current facility-administered medications on file prior to visit.    No Known Allergies  REVIEW OF SYSTEMS:  (Positives checked otherwise negative)  CARDIOVASCULAR:  []  chest pain, []  chest pressure, []  palpitations, []  shortness of breath when laying flat, []  shortness of breath with exertion,  [x]  pain in feet when walking, [x]  pain in feet when laying flat, []  history of blood clot in veins (DVT), []  history of phlebitis, []  swelling in legs, []  varicose veins  PULMONARY:  []  productive cough, []  asthma, []  wheezing  NEUROLOGIC:  [x]  weakness in arms or legs, [x]  numbness in arms or legs, []  difficulty speaking or slurred speech, []  temporary loss of vision in one eye, []  dizziness  HEMATOLOGIC:  []  bleeding problems, []  problems with blood clotting too easily  MUSCULOSKEL:  []  joint pain, []  joint swelling  GASTROINTEST:  []  vomiting blood, []  blood in stool     GENITOURINARY:  []  burning with urination, []  blood in urine  PSYCHIATRIC:  []  history of major depression  INTEGUMENTARY:  []  rashes, []  ulcers  CONSTITUTIONAL:  []  fever, []  chills  For VQI Use Only   PRE-ADM LIVING: Home  AMB STATUS: Ambulatory  CAD Sx: None  PRIOR CHF:  None  STRESS TEST: [x]  No, [ ]  Normal, [ ]  + ischemia, [ ]  + MI, [ ]  Both  Physical Examination  Filed Vitals:   05/11/12 1516  BP: 144/101  Pulse: 94  Resp: 18  Height: 6' (1.829 m)  Weight: 224 lb (101.606 kg)    Body mass index is 30.37 kg/(m^2).  General: A&O x 3, WD, mildly obese  Head: [place]/AT  Ear/Nose/Throat: Hearing grossly intact, nares w/o erythema or drainage, oropharynx w/o Erythema/Exudate, Mallampati score: 3  Eyes: PERRLA, EOMI  Neck: Supple, no nuchal rigidity, no palpable LAD  Pulmonary: Sym exp, good air movt, CTAB, no rales, rhonchi, & wheezing  Cardiac: RRR, Nl S1, S2, no Murmurs, rubs or gallops  Vascular: Vessel Right Left  Radial Palpable Palpable   Ulnar Not Palpable notPalpable  Brachial Palpable Palpable  Carotid Palpable, without bruit Palpable, without bruit  Aorta Not palpable N/A  Femoral Not Palpable Faintly Palpable  Popliteal Not palpable Not palpable  PT Not Palpable Not Palpable  DP Not Palpable Not Palpable   Gastrointestinal: soft, NTND, -G/R, - HSM, - masses, - CVAT B  Musculoskeletal: M/S 5/5 throughout , Extremities without ischemic changes , cool feet, no obvious gangrene  Neurologic: CN 2-12 intact , Pain and light touch intact in extremities except toe tips which have decreased sensation, Motor exam as listed above  Psychiatric: Judgment intact, Mood & affect appropriate for pt's clinical situation  Dermatologic: See M/S exam for extremity exam, no rashes otherwise noted  Lymph : No Cervical, Axillary, or Inguinal lymphadenopathy   Non-Invasive Vascular Imaging  Outside ABI (Date: 04/26/12)  R: 048, Peroneal: severely dampened mono, PT: absent, AT: severely dampened mono  L: 0.62, Peroneal: severely dampened mono, PT: severely dampened mono, AT: absent  Outside Studies/Documentation 4 pages of outside documents were reviewed including: recent ED chart.  Laboratory: CBC:    Component Value Date/Time   WBC 6.3 09/22/2010 1107   RBC 5.04 09/22/2010 1107   HGB 16.7 03/27/2012 1233   HCT 49.0 03/27/2012 1233   PLT 206 09/22/2010 1107   MCV 85.9 09/22/2010 1107   MCH 29.6 09/22/2010 1107   MCHC 34.4 09/22/2010 1107   RDW 13.3 09/22/2010 1107   LYMPHSABS 2.0 09/22/2010 1107   MONOABS 0.6 09/22/2010 1107   EOSABS 0.1 09/22/2010 1107   BASOSABS 0.0 09/22/2010 1107    BMP:    Component Value Date/Time   NA 137 04/29/2012 1157   K 4.2 04/29/2012 1157   CL 99 04/29/2012 1157   CO2 28 04/29/2012 1157   GLUCOSE 175* 04/29/2012 1157   BUN 11 04/29/2012 1157   CREATININE 0.84 04/29/2012 1157   CALCIUM 10.3 04/29/2012 1157   GFRNONAA >90 04/29/2012 1157   GFRAA >90 04/29/2012 1157   Lipid Panel     Component Value  Date/Time   CHOL 400* 04/29/2012 1157   TRIG 608* 04/29/2012 1157   HDL 55 04/29/2012 1157   CHOLHDL 7.3 04/29/2012 1157   VLDL UNABLE TO CALCULATE IF TRIGLYCERIDE OVER 400 mg/dL 07/04/5282 1324   LDLCALC UNABLE TO CALCULATE IF TRIGLYCERIDE OVER 400 mg/dL 04/06/270 5366   Coagulation: No results found for this basename: INR, PROTIME   No results found for this basename: PTT   Medical Decision Making  Javier Quinn is a 52 y.o. male who presents with: BLE critical limb ischemia.   I discussed with the patient the natural history of critical limb ischemia: 25% require amputation in one year, 50% are able to  maintain their limbs in one year, and 25-30% die in one year due to comorbidities.  Given the limb threatening status of this patient, I recommend an aggressive work up including proceeding with an: Aortogram, Bilateral runoff and intervention. I discussed with the patient the nature of angiographic procedures, especially the limited patencies of any endovascular intervention. The patient is aware of that the risks of an angiographic procedure include but are not limited to: bleeding, infection, access site complications, embolization, rupture of treated vessel, dissection, possible need for emergent surgical intervention, and possible need for surgical procedures to treat the patient's pathology. The patient is aware of the risks and agrees to proceed.  The procedure is scheduled for: Aortogram, bilateral runoff, possible intervention, possible brachial access for tomorrow, 05/12/12.  I discussed in depth with the patient the nature of atherosclerosis, and emphasized the importance of maximal medical management including strict control of blood pressure, blood glucose, and lipid levels, antiplatelet agents, obtaining regular exercise, and cessation of smoking.  The patient is aware that without maximal medical management the underlying atherosclerotic disease process will progress, limiting the  benefit of any interventions. The patient is currently not on a statin.  The patient will be started on Lipitor 10 mg PO daily, to be titrated and managed by their primary care physician.   The patient is currently on an anti-platelet: ASA.  Thank you for allowing [place] to participate in this patient's care.  Leonides Sake, MD [facility] [place] Office: [phone] Pager: [phone]  05/11/2012, 4:17 PM

## 2012-05-12 NOTE — Op Note (Signed)
OPERATIVE NOTE   PROCEDURE: 1.  Right common femoral artery cannulation under ultrasound guidance 2.  Aortogram 3.  Bilateral leg runoff  PRE-OPERATIVE DIAGNOSIS: critical limb ischemia   POST-OPERATIVE DIAGNOSIS: same as above   SURGEON: Leonides Sake, MD  ANESTHESIA: conscious sedation  ESTIMATED BLOOD LOSS: 30 cc  CONTRAST: 130 cc  FINDING(S):  Aorta: widely patent  Superior mesenteric artery: patent Celiac artery: patent  Right Left  RA Patent Patent  CIA Patent Patent  EIA Patent Patent  IIA Patent Patent with ~50% stenosis  CFA Patent Patent  SFA Patent Patent  PFA Patent Patent  Pop Occludes at knee level Patent with distal tapering  Trif Occluded Patent  AT Occluded Occluded  Pero Reconstitutes in proximal calf but dwindles distally where its <2 mm distally Patent proximally but occludes in mid-calf, reconstitutes distal for very short segment near ankle  PT Occluded Occluded   SPECIMEN(S):  none  INDICATIONS:   Javier Quinn is a 52 y.o. male who presents with critical limb ischemia with rest pain.  The patient presents for: aortogram, bilateral leg runoff, and possible intervention.  I discussed with the patient the nature of angiographic procedures, especially the limited patencies of any endovascular intervention.  The patient is aware of that the risks of an angiographic procedure include but are not limited to: bleeding, infection, access site complications, renal failure, embolization, rupture of vessel, dissection, possible need for emergent surgical intervention, possible need for surgical procedures to treat the patient's pathology, and stroke and death.  The patient is aware of the risks and agrees to proceed.  DESCRIPTION: After full informed consent was obtained from the patient, the patient was brought back to the angiography suite.  The patient was placed supine upon the angiography table and connected to monitoring equipment.  The patient was then  given conscious sedation, the amounts of which are documented in the patient's chart.  The patient was prepped and drape in the standard fashion for an angiographic procedure.  At this point, attention was turned to the right groin.  Under ultrasound guidance, the right common femoral artery was cannulated with a micropuncture needle.  The microwire was advanced into the iliac arterial system.  The needle was exchanged for a microsheath, which was loaded into the common femoral artery over the wire.  The microwire was exchanged for a [facility] wire which was advanced into the aorta.  The microsheath was then exchanged for a 5-Fr sheath which was loaded into the common femoral artery. The Omniflush catheter was then loaded over the wire up to the level of L1.  The catheter was connected to the power injector circuit.  After de-airring and de-clotting the circuit, a power injector aortogram was completed.  I pulled the catheter down to just proximal to the aortic bifurcation, due to the acute angle of the bifurcation.  An automated right leg runoff was completed.  The findings are listed above.  I also repeated the right tibial injection and a right lateral foot injection to better image that foot.  The findings are listed above.  On digital enhancement, there was minimal perfusion in either feet.  Unfortunately, this patient is not a good candidate for any surgical salvage procedure as the left target is not adequate in my opinion.  The right peroneal artery might me a possible target but the significant disease in the right foot will limit any benefit from that reconstruction.  COMPLICATIONS: none  CONDITION: stable   Leonides Sake, MD  [facility] [place] Office: [phone] Pager: [phone]  05/12/2012, 12:57 PM

## 2012-05-13 ENCOUNTER — Telehealth: Payer: Self-pay | Admitting: Vascular Surgery

## 2012-05-13 NOTE — Telephone Encounter (Addendum)
Message copied by Rosalyn Charters on Fri May 13, 2012 10:29 AM ------      Message from: Melene Plan      Created: Thu May 12, 2012  1:37 PM                   ----- Message -----         From: Fransisco Hertz, MD         Sent: 05/12/2012   1:05 PM           To: Reuel Derby, Melene Plan, RN            QUAVION BOULE      [id number]      [id number]            PROCEDURE:      1.  Right common femoral artery cannulation under ultrasound guidance      2.  Aortogram      3.  Bilateral leg runoff            Follow-up: 2 wk after cardiology evaluation            Orders(s) for follow-up:       1. pt will need ASAP cardiology evaluation for possible R fem-peroneal bypass.      2. BLE GSV mapping ------  notified patient of cardiac appt. with dr.nissan 05-24-12 9:30, then fu with blc on 06-01-12 2:45 also gave patient dr. Tedra [place] number to set up an appt. for primary care

## 2012-05-17 ENCOUNTER — Encounter: Payer: Self-pay | Admitting: Family Medicine

## 2012-05-17 ENCOUNTER — Ambulatory Visit: Payer: No Typology Code available for payment source | Attending: Family Medicine | Admitting: Family Medicine

## 2012-05-17 VITALS — BP 153/103 | HR 91 | Temp 98.3°F | Resp 14 | Wt 201.0 lb

## 2012-05-17 DIAGNOSIS — E1165 Type 2 diabetes mellitus with hyperglycemia: Secondary | ICD-10-CM

## 2012-05-17 DIAGNOSIS — I739 Peripheral vascular disease, unspecified: Secondary | ICD-10-CM

## 2012-05-17 DIAGNOSIS — I70219 Atherosclerosis of native arteries of extremities with intermittent claudication, unspecified extremity: Secondary | ICD-10-CM

## 2012-05-17 DIAGNOSIS — I10 Essential (primary) hypertension: Secondary | ICD-10-CM

## 2012-05-17 MED ORDER — METFORMIN HCL ER 500 MG PO TB24: ORAL_TABLET | ORAL | Status: DC

## 2012-05-17 MED ORDER — PRAVASTATIN SODIUM 80 MG PO TABS: 80.0000 mg | ORAL_TABLET | Freq: Every day | ORAL | Status: DC

## 2012-05-17 MED ORDER — LISINOPRIL-HYDROCHLOROTHIAZIDE 20-12.5 MG PO TABS: 1.0000 | ORAL_TABLET | Freq: Every day | ORAL | Status: DC

## 2012-05-17 NOTE — Patient Instructions (Addendum)
Diabetes and Foot Care Diabetes may cause you to have a poor blood supply (circulation) to your legs and feet. Because of this, the skin may be thinner, break easier, and heal more slowly. You also may have nerve damage in your legs and feet causing decreased feeling. You may not notice minor injuries to your feet that could lead to serious problems or infections. Taking care of your feet is one of the most important things you can do for yourself.  HOME CARE INSTRUCTIONS  Do not go barefoot. Bare feet are easily injured.  Check your feet daily for blisters, cuts, and redness.  Wash your feet with warm water (not hot) and mild soap. Pat your feet and between your toes until completely dry.  Apply a moisturizing lotion that does not contain alcohol or petroleum jelly to the dry skin on your feet and to dry brittle toenails. Do not put it between your toes.  Trim your toenails straight across. Do not dig under them or around the cuticle.  Do not cut corns or calluses, or try to remove them with medicine.  Wear clean cotton socks or stockings every day. Make sure they are not too tight. Do not wear knee high stockings since they may decrease blood flow to your legs.  Wear leather shoes that fit properly and have enough cushioning. To break in new shoes, wear them just a few hours a day to avoid injuring your feet.  Wear shoes at all times, even in the house.  Do not cross your legs. This may decrease the blood flow to your feet.  If you find a minor scrape, cut, or break in the skin on your feet, keep it and the skin around it clean and dry. These areas may be cleansed with mild soap and water. Do not use peroxide, alcohol, iodine or Merthiolate.  When you remove an adhesive bandage, be sure not to harm the skin around it.  If you have a wound, look at it several times a day to make sure it is healing.  Do not use heating pads or hot water bottles. Burns can occur. If you have lost feeling  in your feet or legs, you may not know it is happening until it is too late.  Report any cuts, sores or bruises to your caregiver. Do not wait! SEEK MEDICAL CARE IF:   You have an injury that is not healing or you notice redness, numbness, burning, or tingling.  Your feet always feel cold.  You have pain or cramps in your legs and feet. SEEK IMMEDIATE MEDICAL CARE IF:   There is increasing redness, swelling, or increasing pain in the wound.  There is a red line that goes up your leg.  Pus is coming from a wound.  You develop an unexplained oral temperature above 102 F (38.9 C), or as your caregiver suggests.  You notice a bad smell coming from an ulcer or wound. MAKE SURE YOU:   Understand these instructions.  Will watch your condition.  Will get help right away if you are not doing well or get worse. Document Released: 12/20/1999 Document Revised: 03/16/2011 Document Reviewed: 06/27/2008 [facility] Patient Information 2013 [place], [place]. Diabetes Meal Planning Guide The diabetes meal planning guide is a tool to help you plan your meals and snacks. It is important for people with diabetes to manage their blood glucose (sugar) levels. Choosing the right foods and the right amounts throughout your day will help control your blood glucose.  Eating right can even help you improve your blood pressure and reach or maintain a healthy weight. CARBOHYDRATE COUNTING MADE EASY When you eat carbohydrates, they turn to sugar. This raises your blood glucose level. Counting carbohydrates can help you control this level so you feel better. When you plan your meals by counting carbohydrates, you can have more flexibility in what you eat and balance your medicine with your food intake. Carbohydrate counting simply means adding up the total amount of carbohydrate grams in your meals and snacks. Try to eat about the same amount at each meal. Foods with carbohydrates are listed below. Each portion  below is 1 carbohydrate serving or 15 grams of carbohydrates. Ask your dietician how many grams of carbohydrates you should eat at each meal or snack. Grains and Starches  1 slice bread.   English muffin or hotdog/hamburger bun.   cup cold cereal (unsweetened).   cup cooked pasta or rice.   cup starchy vegetables (corn, potatoes, peas, beans, winter squash).  1 tortilla (6 inches).   bagel.  1 waffle or pancake (size of a CD).   cup cooked cereal.  4 to 6 small crackers. *Whole grain is recommended. Fruit  1 cup fresh unsweetened berries, melon, papaya, pineapple.  1 small fresh fruit.   banana or mango.   cup fruit juice (4 oz unsweetened).   cup canned fruit in natural juice or water.  2 tbs dried fruit.  12 to 15 grapes or cherries. Milk and Yogurt  1 cup fat-free or 1% milk.  1 cup soy milk.  6 oz light yogurt with sugar-free sweetener.  6 oz low-fat soy yogurt.  6 oz plain yogurt. Vegetables  1 cup raw or  cup cooked is counted as 0 carbohydrates or a "free" food.  If you eat 3 or more servings at 1 meal, count them as 1 carbohydrate serving. Other Carbohydrates   oz chips or pretzels.   cup ice cream or frozen yogurt.   cup sherbet or sorbet.  2 inch square cake, no frosting.  1 tbs honey, sugar, jam, jelly, or syrup.  2 small cookies.  3 squares of graham crackers.  3 cups popcorn.  6 crackers.  1 cup broth-based soup.  Count 1 cup casserole or other mixed foods as 2 carbohydrate servings.  Foods with less than 20 calories in a serving may be counted as 0 carbohydrates or a "free" food. You may want to purchase a book or computer software that lists the carbohydrate gram counts of different foods. In addition, the nutrition facts panel on the labels of the foods you eat are a good source of this information. The label will tell you how big the serving size is and the total number of carbohydrate grams you will be eating  per serving. Divide this number by 15 to obtain the number of carbohydrate servings in a portion. Remember, 1 carbohydrate serving equals 15 grams of carbohydrate. SERVING SIZES Measuring foods and serving sizes helps you make sure you are getting the right amount of food. The list below tells how big or small some common serving sizes are.  1 oz.........4 stacked dice.  3 oz........Marland KitchenDeck of cards.  1 tsp.......Marland KitchenTip of little finger.  1 tbs......Marland KitchenMarland KitchenThumb.  2 tbs.......Marland KitchenGolf ball.   cup......Marland KitchenHalf of a fist.  1 cup.......Marland KitchenA fist. SAMPLE DIABETES MEAL PLAN Below is a sample meal plan that includes foods from the grain and starches, dairy, vegetable, fruit, and meat groups. A dietician can individualize a meal  plan to fit your calorie needs and tell you the number of servings needed from each food group. However, controlling the total amount of carbohydrates in your meal or snack is more important than making sure you include all of the food groups at every meal. You may interchange carbohydrate containing foods (dairy, starches, and fruits). The meal plan below is an example of a 2000 calorie diet using carbohydrate counting. This meal plan has 17 carbohydrate servings. Breakfast  1 cup oatmeal (2 carb servings).   cup light yogurt (1 carb serving).  1 cup blueberries (1 carb serving).   cup almonds. Snack  1 large apple (2 carb servings).  1 low-fat string cheese stick. Lunch  Chicken breast salad.  1 cup spinach.   cup chopped tomatoes.  2 oz chicken breast, sliced.  2 tbs low-fat [place] dressing.  12 whole-wheat crackers (2 carb servings).  12 to 15 grapes (1 carb serving).  1 cup low-fat milk (1 carb serving). Snack  1 cup carrots.   cup hummus (1 carb serving). Dinner  3 oz broiled salmon.  1 cup brown rice (3 carb servings). Snack  1  cups steamed broccoli (1 carb serving) drizzled with 1 tsp olive oil and lemon juice.  1 cup light pudding (2  carb servings). DIABETES MEAL PLANNING WORKSHEET Your dietician can use this worksheet to help you decide how many servings of foods and what types of foods are right for you.  BREAKFAST Food Group and Servings / Carb Servings Grain/Starches __________________________________ Dairy __________________________________________ Vegetable ______________________________________ Fruit ___________________________________________ Meat __________________________________________ Fat ____________________________________________ LUNCH Food Group and Servings / Carb Servings Grain/Starches ___________________________________ Dairy ___________________________________________ Fruit ____________________________________________ Meat ___________________________________________ Fat _____________________________________________ Laural Golden Food Group and Servings / Carb Servings Grain/Starches ___________________________________ Dairy ___________________________________________ Fruit ____________________________________________ Meat ___________________________________________ Fat _____________________________________________ SNACKS Food Group and Servings / Carb Servings Grain/Starches ___________________________________ Dairy ___________________________________________ Vegetable _______________________________________ Fruit ____________________________________________ Meat ___________________________________________ Fat _____________________________________________ DAILY TOTALS Starches _________________________ Vegetable ________________________ Fruit ____________________________ Dairy ____________________________ Meat ____________________________ Fat ______________________________ Document Released: 09/18/2004 Document Revised: 03/16/2011 Document Reviewed: 07/30/2008 [employer] Patient Information 2013 [place], [place]. Diabetes and Foot Care Diabetes may cause you to have a poor blood supply  (circulation) to your legs and feet. Because of this, the skin may be thinner, break easier, and heal more slowly. You also may have nerve damage in your legs and feet causing decreased feeling. You may not notice minor injuries to your feet that could lead to serious problems or infections. Taking care of your feet is one of the most important things you can do for yourself.  HOME CARE INSTRUCTIONS  Do not go barefoot. Bare feet are easily injured.  Check your feet daily for blisters, cuts, and redness.  Wash your feet with warm water (not hot) and mild soap. Pat your feet and between your toes until completely dry.  Apply a moisturizing lotion that does not contain alcohol or petroleum jelly to the dry skin on your feet and to dry brittle toenails. Do not put it between your toes.  Trim your toenails straight across. Do not dig under them or around the cuticle.  Do not cut corns or calluses, or try to remove them with medicine.  Wear clean cotton socks or stockings every day. Make sure they are not too tight. Do not wear knee high stockings since they may decrease blood flow to your legs.  Wear leather shoes that fit properly and have enough cushioning. To break in new shoes, wear them just a few hours a  day to avoid injuring your feet.  Wear shoes at all times, even in the house.  Do not cross your legs. This may decrease the blood flow to your feet.  If you find a minor scrape, cut, or break in the skin on your feet, keep it and the skin around it clean and dry. These areas may be cleansed with mild soap and water. Do not use peroxide, alcohol, iodine or Merthiolate.  When you remove an adhesive bandage, be sure not to harm the skin around it.  If you have a wound, look at it several times a day to make sure it is healing.  Do not use heating pads or hot water bottles. Burns can occur. If you have lost feeling in your feet or legs, you may not know it is happening until it is too  late.  Report any cuts, sores or bruises to your caregiver. Do not wait! SEEK MEDICAL CARE IF:   You have an injury that is not healing or you notice redness, numbness, burning, or tingling.  Your feet always feel cold.  You have pain or cramps in your legs and feet. SEEK IMMEDIATE MEDICAL CARE IF:   There is increasing redness, swelling, or increasing pain in the wound.  There is a red line that goes up your leg.  Pus is coming from a wound.  You develop an unexplained oral temperature above 102 F (38.9 C), or as your caregiver suggests.  You notice a bad smell coming from an ulcer or wound. MAKE SURE YOU:   Understand these instructions.  Will watch your condition.  Will get help right away if you are not doing well or get worse. Document Released: 12/20/1999 Document Revised: 03/16/2011 Document Reviewed: 06/27/2008 [facility] Patient Information 2013 [place], [place].

## 2012-05-17 NOTE — Progress Notes (Signed)
Patient complains of bilateral foot pain while walking. States terrible pain in calves while ambulating. Patient states that he was told he may have to have amputation due to lack of circulation.

## 2012-05-17 NOTE — Progress Notes (Signed)
Subjective:     Patient ID: Javier Quinn, male   DOB: [birth year], 52 y.o.   MRN: [id number]  HPI Pt presenting today for follow up.  Pt is reporting that he has seen his vascular surgeon.   He is still having significant pain with ambulation.  He is going to be having a procedure done this week.  Pt has been taking lipitor but is reporting that he has no money and that he is not working consistently.  Pt says that the cost of the medications is too much.  Pt never started any treatment for his diabetes.  He has stopped smoking and drinking all alcohol.   Review of Systems No chest pain, No SOB, no fever or chills, no NVD, all other systems reviewed and reported as negative    Objective:   Physical Exam Filed Vitals:   05/17/12 1803  BP: 153/103  Pulse: 91  Temp: 98.3 F (36.8 C)  Resp: 14   Nursing note and vitals reviewed.  Constitutional: He is oriented to person, place, and time. He appears well-developed and well-nourished. No distress.  Eyes: Conjunctivae and EOM are normal. Pupils are equal, round, and reactive to light.  Neck: Normal range of motion. Neck supple. No JVD present. No thyromegaly present.  Cardiovascular: Normal rate, regular rhythm and normal heart sounds.  No murmur heard.  Pulmonary/Chest: Effort normal and breath sounds normal. No respiratory distress.  Abdominal: Soft. Bowel sounds are normal.  Musculoskeletal: Normal range of motion. He exhibits no edema.  Lymphadenopathy:  He has no cervical adenopathy.  Neurological: He is oriented to person, place, and time. Coordination normal.  Skin: Skin is warm and dry. No rash noted. No erythema. No pallor.  Psychiatric: He has a normal mood and affect. His behavior is normal. Judgment and thought content normal.      Assessment:     Patient Active Problem List   Diagnosis Date Noted  . Pain in joint, ankle and foot 05/11/2012  . Pain in both feet 05/11/2012  . Bilateral swelling of feet 05/11/2012  .  Uncontrolled diabetes mellitus 05/01/2012  . Type 2 diabetes mellitus 05/01/2012  . Dyslipidemia associated with type 2 diabetes mellitus 05/01/2012  . History of tobacco abuse 05/01/2012  . Claudication 04/29/2012  . Peripheral vascular disease 04/29/2012  . Hyperglycemia 04/29/2012       Plan:     Type 2 diabetes mellitus, uncontrolled Hypertension, suboptimally controlled  Severe PAD Hyperglycemia   Start glucophage XR 500 mg take 1 po daily with supper with instructions to titrate  DC lipitor and start pravastatin 80 mg po daily (less expensive) DC HCT and DC enalapril, change to lisinopril HCT 20/12.5, take 1 po daily Referal for diabetes education ordered Follow up in 3 weeks     Results for orders placed during the hospital encounter of 05/12/12  POCT I-STAT, CHEM 8      Result Value Range   Sodium 132 (*) 135 - 145 mEq/L   Potassium 5.1  3.5 - 5.1 mEq/L   Chloride 104  96 - 112 mEq/L   BUN 20  6 - 23 mg/dL   Creatinine, Ser 4.09  0.50 - 1.35 mg/dL   Glucose, Bld 811 (*) 70 - 99 mg/dL   Calcium, Ion 9.14 (*) 1.12 - 1.23 mmol/L   TCO2 25  0 - 100 mmol/L   Hemoglobin 15.6  13.0 - 17.0 g/dL   HCT 78.2  95.6 - 21.3 %    The  patient was given clear instructions to go to ER or return to medical center if symptoms don't improve, worsen or new problems develop.  The patient verbalized understanding.  The patient was told to call to get lab results if they haven't heard anything in the next week.     Rodney Langton, MD, CDE, FAAFP [facility] [place], [place]

## 2012-05-18 DIAGNOSIS — I70219 Atherosclerosis of native arteries of extremities with intermittent claudication, unspecified extremity: Secondary | ICD-10-CM

## 2012-05-18 DIAGNOSIS — I10 Essential (primary) hypertension: Secondary | ICD-10-CM

## 2012-05-18 DIAGNOSIS — I739 Peripheral vascular disease, unspecified: Secondary | ICD-10-CM | POA: Insufficient documentation

## 2012-05-18 HISTORY — DX: Essential (primary) hypertension: I10

## 2012-05-18 HISTORY — DX: Atherosclerosis of native arteries of extremities with intermittent claudication, unspecified extremity: I70.219

## 2012-05-18 HISTORY — DX: Peripheral vascular disease, unspecified: I73.9

## 2012-05-24 ENCOUNTER — Ambulatory Visit (INDEPENDENT_AMBULATORY_CARE_PROVIDER_SITE_OTHER): Payer: No Typology Code available for payment source | Admitting: Cardiovascular Disease

## 2012-05-24 ENCOUNTER — Encounter: Payer: Self-pay | Admitting: Cardiovascular Disease

## 2012-05-24 VITALS — BP 145/102 | HR 90 | Ht 72.0 in | Wt 226.8 lb

## 2012-05-24 DIAGNOSIS — Z01810 Encounter for preprocedural cardiovascular examination: Secondary | ICD-10-CM

## 2012-05-24 DIAGNOSIS — I739 Peripheral vascular disease, unspecified: Secondary | ICD-10-CM

## 2012-05-24 DIAGNOSIS — Z01818 Encounter for other preprocedural examination: Secondary | ICD-10-CM

## 2012-05-24 DIAGNOSIS — I10 Essential (primary) hypertension: Secondary | ICD-10-CM

## 2012-05-24 NOTE — Progress Notes (Signed)
Patient ID: Javier Quinn, male   DOB: 10/09/1960, 52 y.o.   MRN: [id number] 52 yo smoker who quit a month ago with PVD.  Needs RLE surgery with Dr Imogene Burn.  Has 1 block claudication right worse than left with resting ABI:s in the .5 to  .6 range.  No nonhealing ulcers. Lots of pain and swelling in feet.  No history of CAD.  Still works detailing cars.  CRF;s poorly controlled DM and HTN quit smoking a month ago. No chest pain but limited activity.  Denies dyspnea palpitations or syncope  ROS: Denies fever, malais, weight loss, blurry vision, decreased visual acuity, cough, sputum, SOB, hemoptysis, pleuritic pain, palpitaitons, heartburn, abdominal pain, melena, lower extremity edema,, or rash.  All other systems reviewed and negative   General: Affect appropriate Healthy:  appears stated age HEENT: normal Neck supple with no adenopathy JVP normal no bruits no thyromegaly Lungs clear with no wheezing and good diaphragmatic motion Heart:  S1/S2 no murmur,rub, gallop or click PMI normal Abdomen: benighn, BS positve, no tenderness, no AAA   No HSM or HJR Distal pulses difficult to palpate  Right radial also hard to palpate No edema Neuro non-focal Skin warm and dry No muscular weakness  Medications Current Outpatient Prescriptions  Medication Sig Dispense Refill  . aspirin 81 MG tablet Take 1 tablet (81 mg total) by mouth daily.      [name] atorvastatin (LIPITOR) 10 MG tablet Take 10 mg by mouth daily.      . enalapril (VASOTEC) 5 MG tablet Take 5 mg by mouth daily.      . hydrochlorothiazide (HYDRODIURIL) 25 MG tablet Take 25 mg by mouth daily.      . metFORMIN (GLUCOPHAGE-XR) 500 MG 24 hr tablet Take 1 po every day with supper for 1 week, then take 1 po with breakfast and 1 tablet po with supper for 2 weeks, then take 2 tabs po bid AC  120 tablet  3  . potassium chloride (K-DUR) 10 MEQ tablet Take 10 mEq by mouth 2 (two) times daily.       No current facility-administered medications for this  visit.    Allergies Review of patient's allergies indicates no known allergies.  Family History: Family History  Problem Relation Age of Onset  . Cancer Mother     pancreas  . Cancer Father     Social History: History   Social History  . Marital Status: Single    Spouse Name: N/A    Number of Children: N/A  . Years of Education: N/A   Occupational History  . Not on file.   Social History Main Topics  . Smoking status: Former Smoker    Quit date: 04/11/2012  . Smokeless tobacco: Never Used  . Alcohol Use: Yes     Comment: occ  . Drug Use: No  . Sexually Active: Not on file   Other Topics Concern  . Not on file   Social History Narrative  . No narrative on file    Electrocardiogram:  5/18  SR rate 76 Q in 3,F   Assessment and Plan

## 2012-05-24 NOTE — Assessment & Plan Note (Signed)
Well controlled.  Continue current medications and low sodium Dash type diet.    

## 2012-05-24 NOTE — Assessment & Plan Note (Signed)
Smoker and DM with PVD  Needs lexiscan myovue to clear for vascular surgery

## 2012-05-24 NOTE — Assessment & Plan Note (Signed)
Discussed good foot care and proper shoes. F/U with Dr Imogene Burn for surgery if myovue ok

## 2012-05-24 NOTE — Patient Instructions (Signed)
Your physician recommends that you schedule a follow-up appointment in:   3 MONTHS WITH  DR NISHAN  Your physician recommends that you continue on your current medications as directed. Please refer to the Current Medication list given to you today.  Your physician has requested that you have a lexiscan myoview. For further information please visit [email or web address]. Please follow instruction sheet, as given.  

## 2012-05-26 ENCOUNTER — Ambulatory Visit (HOSPITAL_COMMUNITY): Payer: No Typology Code available for payment source | Attending: Cardiology | Admitting: Radiology

## 2012-05-26 VITALS — BP 144/97 | HR 75 | Ht 72.0 in | Wt 226.0 lb

## 2012-05-26 DIAGNOSIS — E785 Hyperlipidemia, unspecified: Secondary | ICD-10-CM | POA: Insufficient documentation

## 2012-05-26 DIAGNOSIS — R55 Syncope and collapse: Secondary | ICD-10-CM | POA: Insufficient documentation

## 2012-05-26 DIAGNOSIS — Z01810 Encounter for preprocedural cardiovascular examination: Secondary | ICD-10-CM | POA: Insufficient documentation

## 2012-05-26 DIAGNOSIS — I739 Peripheral vascular disease, unspecified: Secondary | ICD-10-CM | POA: Insufficient documentation

## 2012-05-26 DIAGNOSIS — I10 Essential (primary) hypertension: Secondary | ICD-10-CM | POA: Insufficient documentation

## 2012-05-26 DIAGNOSIS — Z87891 Personal history of nicotine dependence: Secondary | ICD-10-CM | POA: Insufficient documentation

## 2012-05-26 DIAGNOSIS — Z01818 Encounter for other preprocedural examination: Secondary | ICD-10-CM

## 2012-05-26 DIAGNOSIS — R9431 Abnormal electrocardiogram [ECG] [EKG]: Secondary | ICD-10-CM

## 2012-05-26 DIAGNOSIS — E119 Type 2 diabetes mellitus without complications: Secondary | ICD-10-CM | POA: Insufficient documentation

## 2012-05-26 DIAGNOSIS — R42 Dizziness and giddiness: Secondary | ICD-10-CM | POA: Insufficient documentation

## 2012-05-26 MED ORDER — REGADENOSON 0.4 MG/5ML IV SOLN
0.4000 mg | Freq: Once | INTRAVENOUS | Status: AC
  Administered 2012-05-26: 0.4 mg via INTRAVENOUS

## 2012-05-26 MED ORDER — TECHNETIUM TC 99M SESTAMIBI GENERIC - CARDIOLITE
11.0000 | Freq: Once | INTRAVENOUS | Status: AC | PRN
  Administered 2012-05-26: 11 via INTRAVENOUS

## 2012-05-26 MED ORDER — TECHNETIUM TC 99M SESTAMIBI GENERIC - CARDIOLITE
33.0000 | Freq: Once | INTRAVENOUS | Status: AC | PRN
  Administered 2012-05-26: 33 via INTRAVENOUS

## 2012-05-26 NOTE — Progress Notes (Signed)
[facility] SITE 3 NUCLEAR MED [address] [place], [place] [address] [phone]    Cardiology Nuclear Med Study  LEVOY GEISEN is a 52 y.o. male     MRN : [id number]     DOB: 1960/12/31  Procedure Date: 05/26/2012  Nuclear Med Background Indication for Stress Test:  Evaluation for Ischemia and Clearance for Pending RLE Surgery by Dr. Imogene Burn History:  No previously documented CAD Cardiac Risk Factors: Claudication, History of Smoking, Hypertension, Lipids, NIDDM and PVD  Symptoms:  Dizziness and Near Syncope   Nuclear Pre-Procedure Caffeine/Decaff Intake:  None NPO After: 5:00am   Lungs:  clear O2 Sat: 96% on room air. IV 0.9% NS with Angio Cath:  20g  IV Site: R Forearm  IV Started by:  Stanton Kidney, EMT-P  Chest Size (in):  46 Cup Size: n/a  Height: 6' (1.829 m)  Weight:  226 lb (102.513 kg)  BMI:  Body mass index is 30.64 kg/(m^2). Tech Comments:  Med's were taken as directed, per patient.    Nuclear Med Study 1 or 2 day study: 1 day  Stress Test Type:  Eugenie Birks  Reading MD: Dietrich Pates, MD  Order Authorizing Provider:  Charlton Haws, MD  Resting Radionuclide: Technetium 69m Sestamibi  Resting Radionuclide Dose: 11.0 mCi   Stress Radionuclide:  Technetium 74m Sestamibi  Stress Radionuclide Dose: 33.0 mCi           Stress Protocol Rest HR: 75 Stress HR: 107  Rest BP: 144/97 Stress BP: 136/97  Exercise Time (min): n/a METS: n/a   Predicted Max HR: 167 bpm % Max HR: 64.07 bpm Rate Pressure Product: 36644   Dose of Adenosine (mg):  n/a Dose of Lexiscan: 0.4 mg  Dose of Atropine (mg): n/a Dose of Dobutamine: n/a mcg/kg/min (at max HR)  Stress Test Technologist: Smiley Houseman, CMA-N  Nuclear Technologist:  Domenic Polite, CNMT     Rest Procedure:  Myocardial perfusion imaging was performed at rest 45 minutes following the intravenous administration of Technetium 49m Sestamibi.  Rest ECG: NSR - Normal EKG and NSR with non-specific ST-T wave  changes  Stress Procedure:  The patient received IV Lexiscan 0.4 mg over 15-seconds.  Technetium 88m Sestamibi injected at 30-seconds.  Quantitative spect images were obtained after a 45 minute delay.  Stress ECG: No significant change from baseline ECG  QPS Raw Data Images:  Soft tissue (diaphragm) underlies the heart. Stress Images:  Mild thinning in the inferior wall and apex.  Otherwise normal perfusion.   Rest Images:  No significant change from the stress images. Subtraction (SDS):   No ischemia. Transient Ischemic Dilatation (Normal <1.22):  1.21 Lung/Heart Ratio (Normal <0.45):  0.33  Quantitative Gated Spect Images QGS EDV:  119 ml QGS ESV:  59 ml  Impression Exercise Capacity:  Lexiscan with no exercise. BP Response:  Normal blood pressure response. Clinical Symptoms:  No symptoms. ECG Impression:  No significant ST segment change suggestive of ischemia. Comparison with Prior Nuclear Study: No previous nuclear study performed  Overall Impression:  Probable normal perfusion and mild soft tissue attenuation.    LV Ejection Fraction: 50%.  LV Wall Motion:  Overall normal wall thickening.  LVEF appears greater than calculated.  Dietrich Pates

## 2012-05-31 ENCOUNTER — Encounter: Payer: Self-pay | Admitting: Vascular Surgery

## 2012-06-01 ENCOUNTER — Encounter: Payer: Self-pay | Admitting: Vascular Surgery

## 2012-06-01 ENCOUNTER — Encounter (INDEPENDENT_AMBULATORY_CARE_PROVIDER_SITE_OTHER): Payer: No Typology Code available for payment source | Admitting: Vascular Surgery

## 2012-06-01 ENCOUNTER — Ambulatory Visit (INDEPENDENT_AMBULATORY_CARE_PROVIDER_SITE_OTHER): Payer: No Typology Code available for payment source | Admitting: Vascular Surgery

## 2012-06-01 VITALS — BP 128/82 | HR 94 | Resp 18 | Ht 72.0 in | Wt 224.0 lb

## 2012-06-01 DIAGNOSIS — I998 Other disorder of circulatory system: Secondary | ICD-10-CM | POA: Insufficient documentation

## 2012-06-01 DIAGNOSIS — I70219 Atherosclerosis of native arteries of extremities with intermittent claudication, unspecified extremity: Secondary | ICD-10-CM

## 2012-06-01 DIAGNOSIS — Z01810 Encounter for preprocedural cardiovascular examination: Secondary | ICD-10-CM

## 2012-06-01 DIAGNOSIS — I739 Peripheral vascular disease, unspecified: Secondary | ICD-10-CM

## 2012-06-01 DIAGNOSIS — I999 Unspecified disorder of circulatory system: Secondary | ICD-10-CM

## 2012-06-01 NOTE — Progress Notes (Signed)
[facility] [place]  History of Present Illness  Javier Quinn is a 52 y.o. male who presents for postoperative follow-up from procedure on Date: dx angiogram: 05/12/12.  On that study, he has R popliteal obliteration from knee down and collateral reconstitution of primarily a peroneal artery.  On the left, he has tibial obliteration with only a residual peroneal distally.  He continues to have severe rest pain: R>>L.  He has been seen by Cardiology and nuclear stress testing is complete: normal.  Past Medical History  Diagnosis Date  . Hypertension   . Diabetes mellitus without complication   . Hyperlipidemia   . Tobacco dependence     History reviewed. No pertinent past surgical history.  History   Social History  . Marital Status: Single    Spouse Name: N/A    Number of Children: N/A  . Years of Education: N/A   Occupational History  . Not on file.   Social History Main Topics  . Smoking status: Former Smoker    Types: Cigarettes    Quit date: 04/11/2012  . Smokeless tobacco: Never Used  . Alcohol Use: Yes     Comment: occ  . Drug Use: No  . Sexually Active: Not on file   Other Topics Concern  . Not on file   Social History Narrative  . No narrative on file    Family History  Problem Relation Age of Onset  . Cancer Mother     pancreas  . Cancer Father     Current Outpatient Prescriptions on File Prior to Visit  Medication Sig Dispense Refill  . aspirin 81 MG tablet Take 1 tablet (81 mg total) by mouth daily.      . atorvastatin (LIPITOR) 10 MG tablet Take 10 mg by mouth daily.      . enalapril (VASOTEC) 5 MG tablet Take 5 mg by mouth daily.      . hydrochlorothiazide (HYDRODIURIL) 25 MG tablet Take 25 mg by mouth daily.      . metFORMIN (GLUCOPHAGE-XR) 500 MG 24 hr tablet Take 1 po every day with supper for 1 week, then take 1 po with breakfast and 1 tablet po with supper for 2 weeks, then take 2 tabs po bid AC  120 tablet  3  .  potassium chloride (K-DUR) 10 MEQ tablet Take 10 mEq by mouth 2 (two) times daily.       No current facility-administered medications on file prior to visit.    No Known Allergies  REVIEW OF SYSTEMS: (Positives checked otherwise negative)  CARDIOVASCULAR: [] chest pain, [] chest pressure, [] palpitations, [] shortness of breath when laying flat, [] shortness of breath with exertion, [x] pain in feet when walking, [x] pain in feet when laying flat, [] history of blood clot in veins (DVT), [] history of phlebitis, [] swelling in legs, [] varicose veins  PULMONARY: [] productive cough, [] asthma, [] wheezing  NEUROLOGIC: [x] weakness in arms or legs, [x] numbness in arms or legs, [] difficulty speaking or slurred speech, [] temporary loss of vision in one eye, [] dizziness  HEMATOLOGIC: [] bleeding problems, [] problems with blood clotting too easily  MUSCULOSKEL: [] joint pain, [] joint swelling  GASTROINTEST: [] vomiting blood, [] blood in stool  GENITOURINARY: [] burning with urination, [] blood in urine  PSYCHIATRIC: [] history of major depression  INTEGUMENTARY: [] rashes, [] ulcers  CONSTITUTIONAL: [] fever, [] chills   For VQI Use Only    PRE-ADM LIVING: Home  AMB STATUS: Ambulatory  CAD Sx: None  PRIOR CHF: None  STRESS TEST: [] No, [x] Normal, [ ] + ischemia, [ ] + MI, [ ] Both  Physical Examination  Filed Vitals:   06/01/12 1453  BP: 128/82  Pulse: 94  Resp: 18  Height: 6' (1.829 m)  Weight: 224 lb (101.606 kg)   Body mass index is 30.37 kg/(m^2).  General: A&O x 3, WD, obese  Pulmonary: Sym exp, good air movt, CTAB, no rales, rhonchi, & wheezing  Cardiac: RRR, Nl S1, S2, no Murmurs, rubs or gallops  Vascular: Vessel Right Left  Radial Palpable Palpable  Ulnar Palpable Palpable  Brachial Palpable Palpable  Carotid Palpable, without bruit Palpable, without bruit  Aorta Not palpable N/A  Femoral Palpable Palpable  Popliteal Not palpable Not palpable  PT Not  Palpable Not Palpable  DP Not Palpable Not Palpable   Gastrointestinal: soft, NTND, -G/R, - HSM, - masses, - CVAT B, obese, cannot palpate AAA  Musculoskeletal: M/S 5/5 throughout , Extremities without ischemic changes , cool feet, no obvious gangrene   Neurologic: Pain and light touch intact in extremities except toe tips which have decreased sensation, Motor exam as listed above  B GSV Mapping (Date: 06/01/12)  R  GSV: 2.1-8.2 mm (mid thigh 2.1 mm)  L GSV: 2.1-9.2 mm (at calf 2.1 mm)  Medical Decision Making  Javier Quinn is a 52 y.o. male who presents s/p dx angiogram with R>>L rest pain. 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 His only chance at limb salvage on right is R SFA to peroneal bypass.  On the left, he will probably need a L pop to distal peroneal bypass. Unfortunately, his vein conduit isn't optimal bilaterally, so some composite conduit may need to be fashioned. I appears he will be cleared by cardiology based on his stress testing. The risk, benefits, and alternative for bypass operations were discussed with the patient.  The patient is aware the risks include but are not limited to: bleeding, infection, myocardial infarction, stroke, limb loss, nerve damage, need for additional procedures in the future, wound complications, and inability to complete the bypass.   I discussed in depth the importance of his medical management of his multiple high grade co-morbidities. The patient is also aware of the low probability of salvage in his case.   The patient is aware of these risks and agreed to proceed.  He is scheduled for the 11 JUN 14. I discussed in depth with the patient the nature of atherosclerosis, and emphasized the importance of maximal medical management including strict control of blood pressure, blood glucose, and lipid levels, obtaining regular exercise, and cessation of smoking.  The patient is aware that  without maximal medical management the underlying atherosclerotic disease process will progress, limiting the benefit of any interventions.  Thank you for allowing us to participate in this patient's care.  Jarek Longton, MD [facility] [place] Office: [phone] Pager: [phone]   

## 2012-06-02 ENCOUNTER — Other Ambulatory Visit: Payer: Self-pay | Admitting: *Deleted

## 2012-06-07 ENCOUNTER — Ambulatory Visit: Payer: No Typology Code available for payment source

## 2012-06-08 ENCOUNTER — Encounter (HOSPITAL_COMMUNITY)
Admission: RE | Admit: 2012-06-08 | Discharge: 2012-06-08 | Disposition: A | Discharge status: Home / Self Care | Payer: No Typology Code available for payment source | Source: Ambulatory Visit | Attending: Vascular Surgery | Admitting: Vascular Surgery

## 2012-06-08 ENCOUNTER — Encounter (HOSPITAL_COMMUNITY): Payer: Self-pay

## 2012-06-08 ENCOUNTER — Ambulatory Visit (HOSPITAL_COMMUNITY)
Admission: RE | Admit: 2012-06-08 | Discharge: 2012-06-08 | Disposition: A | Discharge status: Home / Self Care | Payer: No Typology Code available for payment source | Source: Ambulatory Visit | Attending: Anesthesiology | Admitting: Anesthesiology

## 2012-06-08 ENCOUNTER — Encounter (HOSPITAL_COMMUNITY): Payer: Self-pay | Admitting: Pharmacy Technician

## 2012-06-08 DIAGNOSIS — Z01818 Encounter for other preprocedural examination: Secondary | ICD-10-CM | POA: Insufficient documentation

## 2012-06-08 DIAGNOSIS — Z01812 Encounter for preprocedural laboratory examination: Secondary | ICD-10-CM | POA: Insufficient documentation

## 2012-06-08 DIAGNOSIS — I10 Essential (primary) hypertension: Secondary | ICD-10-CM | POA: Insufficient documentation

## 2012-06-08 DIAGNOSIS — E119 Type 2 diabetes mellitus without complications: Secondary | ICD-10-CM | POA: Insufficient documentation

## 2012-06-08 HISTORY — DX: Gastro-esophageal reflux disease without esophagitis: K21.9

## 2012-06-08 HISTORY — DX: Personal history of other diseases of the digestive system: Z87.19

## 2012-06-08 LAB — COMPREHENSIVE METABOLIC PANEL
ALT: 28 U/L (ref 0–53)
BUN: 26 mg/dL — ABNORMAL HIGH (ref 6–23)
CO2: 21 mEq/L (ref 19–32)
Calcium: 9.7 mg/dL (ref 8.4–10.5)
Creatinine, Ser: 1.11 mg/dL (ref 0.50–1.35)
GFR calc Af Amer: 85 mL/min — ABNORMAL LOW (ref >90)
GFR calc non Af Amer: 74 mL/min — ABNORMAL LOW (ref >90)
Glucose, Bld: 223 mg/dL — ABNORMAL HIGH (ref 70–99)
Sodium: 134 mEq/L — ABNORMAL LOW (ref 135–145)

## 2012-06-08 LAB — ABO/RH: ABO/RH(D): A POS

## 2012-06-08 LAB — CBC
HCT: 39 % (ref 39.0–52.0)
Hemoglobin: 13.9 g/dL (ref 13.0–17.0)
MCH: 28.8 pg (ref 26.0–34.0)
MCV: 80.9 fL (ref 78.0–100.0)
RBC: 4.82 MIL/uL (ref 4.22–5.81)

## 2012-06-08 LAB — URINALYSIS, ROUTINE W REFLEX MICROSCOPIC
Glucose, UA: 500 mg/dL — AB
Hgb urine dipstick: NEGATIVE
Protein, ur: NEGATIVE mg/dL

## 2012-06-08 LAB — TYPE AND SCREEN: ABO/RH(D): A POS

## 2012-06-08 NOTE — Progress Notes (Signed)
06/08/12 1328  OBSTRUCTIVE SLEEP APNEA  Have you ever been diagnosed with sleep apnea through a sleep study? No  Do you snore loudly (loud enough to be heard through closed doors)?  1  Do you often feel tired, fatigued, or sleepy during the daytime? 1  Has anyone observed you stop breathing during your sleep? 0  Do you have, or are you being treated for high blood pressure? 1  BMI more than 35 kg/m2? 0  Age over 54 years old? 1  Neck circumference greater than 40 cm/18 inches? 0  Gender: 1  Obstructive Sleep Apnea Score 5  Score 4 or greater  Results sent to PCP

## 2012-06-08 NOTE — Pre-Procedure Instructions (Signed)
YOSIEL THIEME  06/08/2012   Your procedure is scheduled on:  Wednesday, June 11th  Report to [facility] at 0630 AM. Come to main entrance "A" and go to east elevators up to 3rd floor. Check in at short stay desk.  Call this number if you have problems the morning of surgery: [phone]   Remember:   Do not eat food or drink liquids after midnight.   Take these medicines the morning of surgery with A SIP OF WATER: none   Do not wear jewelry.  Do not wear lotions, powders, or perfume,deodorant.  Do not shave 48 hours prior to surgery. Men may shave face and neck.  Do not bring valuables to the hospital.  [facility] is not responsible  for any belongings or valuables.  Contacts, dentures or bridgework may not be worn into surgery.  Leave suitcase in the car. After surgery it may be brought to your room.  For patients admitted to the hospital, checkout time is 11:00 AM the day of discharge.   Patients discharged the day of surgery will not be allowed to drive home.    Special Instructions: Shower using CHG 2 nights before surgery and the night before surgery.  If you shower the day of surgery use CHG.  Use special wash - you have one bottle of CHG for all showers.  You should use approximately 1/3 of the bottle for each shower.   Please read over the following fact sheets that you were given: Pain Booklet, Coughing and Deep Breathing, Blood Transfusion Information, MRSA Information and Surgical Site Infection Prevention

## 2012-06-08 NOTE — Progress Notes (Signed)
No cardiologist.  Pt. Stating he doesn't have a cbg meter and did not get any information from his primary md concerning diabetes.  Order for diabetic consult when pt. Arrives for surgery in epic. Also, talked to [place], diabetes coordinator. She came and gave pt. Information about diabetes and told him he can buy a meter at [employer]. Pt. Stated he would.

## 2012-06-09 NOTE — Progress Notes (Signed)
Anesthesia chart review: Patient is a 52 year old male scheduled for right superficial femoral artery to peroneal bypass graft on 06/15/2012 by Dr. Imogene Burn. History includes recent former smoker, hypertension, diabetes mellitus type 2, hyperlipidemia, GERD, hiatal hernia.  BMI 30. OSA screening score was 5.  Patient was seen by cardiologist Dr. Eden Emms on 05/24/12 for a preoperative evaluation.  He recommended a stress test (see below).  Nuclear stress test on 05/26/12 showed: Probable normal perfusion and mild soft tissue attenuation.  LV Ejection Fraction: 50%. LV Wall Motion: Overall normal wall thickening. LVEF appears greater than calculated. By notes, Dr. Eden Emms felt it was a "Normal myovue study with no evidence of ischemia or infarction."  EKG on 05/12/12 showed NSR, possible inferior infarct (age undermined), lateral T wave abnormality.  Chest x-ray on 06/08/2012 to no acute cardiopulmonary abnormalities.  Preoperative labs noted. He will get a fasting CBG on arrival.  His EKG is abnormal, but patient had a recent stress test showing no ischemia/infarct.  If glucose is reasonable and no acute changes then I would anticipate that he could proceed as planned.  Velna Ochs [facility]/Anesthesiology Phone [phone] 06/09/2012 1:51 PM

## 2012-06-14 MED ORDER — DEXTROSE 5 % IV SOLN
1.5000 g | INTRAVENOUS | Status: AC
  Administered 2012-06-15: 1.5 g via INTRAVENOUS
  Filled 2012-06-14: qty 1.5

## 2012-06-15 ENCOUNTER — Inpatient Hospital Stay (HOSPITAL_COMMUNITY): Payer: Medicaid Other

## 2012-06-15 ENCOUNTER — Encounter (HOSPITAL_COMMUNITY)
Admission: RE | Disposition: A | Discharge status: Skilled Nursing Facility | Payer: Self-pay | Source: Ambulatory Visit | Attending: Vascular Surgery

## 2012-06-15 ENCOUNTER — Inpatient Hospital Stay (HOSPITAL_COMMUNITY): Payer: Medicaid Other | Admitting: Anesthesiology

## 2012-06-15 ENCOUNTER — Inpatient Hospital Stay (HOSPITAL_COMMUNITY)
Admission: RE | Admit: 2012-06-15 | Discharge: 2012-06-24 | DRG: 254 | Disposition: A | Discharge status: Skilled Nursing Facility | Payer: Medicaid Other | Source: Ambulatory Visit | Attending: Vascular Surgery | Admitting: Vascular Surgery

## 2012-06-15 ENCOUNTER — Encounter (HOSPITAL_COMMUNITY): Payer: Self-pay | Admitting: Surgery

## 2012-06-15 ENCOUNTER — Encounter (HOSPITAL_COMMUNITY): Payer: Self-pay | Admitting: Vascular Surgery

## 2012-06-15 DIAGNOSIS — I70229 Atherosclerosis of native arteries of extremities with rest pain, unspecified extremity: Secondary | ICD-10-CM

## 2012-06-15 DIAGNOSIS — I10 Essential (primary) hypertension: Secondary | ICD-10-CM | POA: Diagnosis present

## 2012-06-15 DIAGNOSIS — I798 Other disorders of arteries, arterioles and capillaries in diseases classified elsewhere: Secondary | ICD-10-CM | POA: Diagnosis present

## 2012-06-15 DIAGNOSIS — K219 Gastro-esophageal reflux disease without esophagitis: Secondary | ICD-10-CM | POA: Diagnosis present

## 2012-06-15 DIAGNOSIS — Z87891 Personal history of nicotine dependence: Secondary | ICD-10-CM

## 2012-06-15 DIAGNOSIS — I70219 Atherosclerosis of native arteries of extremities with intermittent claudication, unspecified extremity: Principal | ICD-10-CM

## 2012-06-15 DIAGNOSIS — E785 Hyperlipidemia, unspecified: Secondary | ICD-10-CM | POA: Diagnosis present

## 2012-06-15 DIAGNOSIS — Z7982 Long term (current) use of aspirin: Secondary | ICD-10-CM

## 2012-06-15 DIAGNOSIS — Z01810 Encounter for preprocedural cardiovascular examination: Secondary | ICD-10-CM

## 2012-06-15 DIAGNOSIS — I771 Stricture of artery: Secondary | ICD-10-CM | POA: Diagnosis present

## 2012-06-15 DIAGNOSIS — E1159 Type 2 diabetes mellitus with other circulatory complications: Secondary | ICD-10-CM | POA: Diagnosis present

## 2012-06-15 DIAGNOSIS — I739 Peripheral vascular disease, unspecified: Secondary | ICD-10-CM

## 2012-06-15 DIAGNOSIS — Z794 Long term (current) use of insulin: Secondary | ICD-10-CM

## 2012-06-15 HISTORY — PX: LOWER EXTREMITY ANGIOGRAM: SHX5508

## 2012-06-15 HISTORY — PX: BYPASS GRAFT FEMORAL-PERONEAL: SHX5762

## 2012-06-15 LAB — CBC
HCT: 35.7 % — ABNORMAL LOW (ref 39.0–52.0)
MCV: 83 fL (ref 78.0–100.0)
RDW: 13.9 % (ref 11.5–15.5)
WBC: 10.3 10*3/uL (ref 4.0–10.5)

## 2012-06-15 LAB — GLUCOSE, CAPILLARY
Glucose-Capillary: 270 mg/dL — ABNORMAL HIGH (ref 70–99)
Glucose-Capillary: 264 mg/dL — ABNORMAL HIGH (ref 70–99)
Glucose-Capillary: 196 mg/dL — ABNORMAL HIGH (ref 70–99)

## 2012-06-15 LAB — CREATININE, SERUM: GFR calc Af Amer: 86 mL/min — ABNORMAL LOW (ref >90)

## 2012-06-15 SURGERY — CREATION, BYPASS, ARTERIAL, FEMORAL TO PERONEAL, USING GRAFT
Anesthesia: General | Site: Leg Upper | Laterality: Right | Wound class: Clean

## 2012-06-15 MED ORDER — LIDOCAINE HCL (CARDIAC) 20 MG/ML IV SOLN
INTRAVENOUS | Status: DC | PRN
  Administered 2012-06-15: 70 mg via INTRAVENOUS

## 2012-06-15 MED ORDER — METOPROLOL TARTRATE 1 MG/ML IV SOLN: 2.0000 mg | INTRAVENOUS | Status: DC | PRN

## 2012-06-15 MED ORDER — ALBUMIN HUMAN 5 % IV SOLN
INTRAVENOUS | Status: DC | PRN
  Administered 2012-06-15: 11:00:00 via INTRAVENOUS

## 2012-06-15 MED ORDER — SIMVASTATIN 40 MG PO TABS
40.0000 mg | ORAL_TABLET | Freq: Every day | ORAL | Status: DC
  Administered 2012-06-15 – 2012-06-23 (×9): 40 mg via ORAL
  Filled 2012-06-15 (×10): qty 1

## 2012-06-15 MED ORDER — MIDAZOLAM HCL 5 MG/5ML IJ SOLN
INTRAMUSCULAR | Status: DC | PRN
  Administered 2012-06-15 (×2): 1 mg via INTRAVENOUS

## 2012-06-15 MED ORDER — HYDRALAZINE HCL 20 MG/ML IJ SOLN: 10.0000 mg | INTRAMUSCULAR | Status: DC | PRN

## 2012-06-15 MED ORDER — ACETAMINOPHEN 325 MG PO TABS: 325.0000 mg | ORAL_TABLET | ORAL | Status: DC | PRN

## 2012-06-15 MED ORDER — 0.9 % SODIUM CHLORIDE (POUR BTL) OPTIME
TOPICAL | Status: DC | PRN
  Administered 2012-06-15: 2000 mL

## 2012-06-15 MED ORDER — PHENYLEPHRINE HCL 10 MG/ML IJ SOLN
INTRAMUSCULAR | Status: DC | PRN
  Administered 2012-06-15: 40 ug via INTRAVENOUS
  Administered 2012-06-15: 80 ug via INTRAVENOUS
  Administered 2012-06-15: 40 ug via INTRAVENOUS
  Administered 2012-06-15: 80 ug via INTRAVENOUS
  Administered 2012-06-15 (×2): 40 ug via INTRAVENOUS
  Administered 2012-06-15: 80 ug via INTRAVENOUS
  Administered 2012-06-15 (×3): 40 ug via INTRAVENOUS

## 2012-06-15 MED ORDER — INSULIN ASPART 100 UNIT/ML ~~LOC~~ SOLN
SUBCUTANEOUS | Status: AC
  Administered 2012-06-15: 8 [IU] via SUBCUTANEOUS
  Filled 2012-06-15: qty 1

## 2012-06-15 MED ORDER — PHENOL 1.4 % MT LIQD: 1.0000 | OROMUCOSAL | Status: DC | PRN

## 2012-06-15 MED ORDER — SODIUM CHLORIDE 0.9 % IV SOLN: 500.0000 mL | Freq: Once | INTRAVENOUS | Status: AC | PRN

## 2012-06-15 MED ORDER — CEFUROXIME SODIUM 1.5 G IJ SOLR
1.5000 g | Freq: Two times a day (BID) | INTRAMUSCULAR | Status: AC
  Administered 2012-06-15 – 2012-06-16 (×2): 1.5 g via INTRAVENOUS
  Filled 2012-06-15 (×2): qty 1.5

## 2012-06-15 MED ORDER — HYDROCHLOROTHIAZIDE 12.5 MG PO CAPS
12.5000 mg | ORAL_CAPSULE | Freq: Every day | ORAL | Status: DC
  Administered 2012-06-16 – 2012-06-24 (×8): 12.5 mg via ORAL
  Filled 2012-06-15 (×9): qty 1

## 2012-06-15 MED ORDER — ACETAMINOPHEN 650 MG RE SUPP: 325.0000 mg | RECTAL | Status: DC | PRN

## 2012-06-15 MED ORDER — INSULIN ASPART 100 UNIT/ML ~~LOC~~ SOLN
0.0000 [IU] | Freq: Three times a day (TID) | SUBCUTANEOUS | Status: DC
  Administered 2012-06-16 (×2): 5 [IU] via SUBCUTANEOUS

## 2012-06-15 MED ORDER — METFORMIN HCL ER 500 MG PO TB24
500.0000 mg | ORAL_TABLET | Freq: Two times a day (BID) | ORAL | Status: DC
  Administered 2012-06-16 – 2012-06-24 (×17): 500 mg via ORAL
  Filled 2012-06-15 (×19): qty 1

## 2012-06-15 MED ORDER — LIDOCAINE HCL 4 % MT SOLN
OROMUCOSAL | Status: DC | PRN
  Administered 2012-06-15: 4 mL via TOPICAL

## 2012-06-15 MED ORDER — FENTANYL CITRATE 0.05 MG/ML IJ SOLN
INTRAMUSCULAR | Status: DC | PRN
  Administered 2012-06-15 (×4): 50 ug via INTRAVENOUS
  Administered 2012-06-15: 100 ug via INTRAVENOUS
  Administered 2012-06-15 (×2): 50 ug via INTRAVENOUS

## 2012-06-15 MED ORDER — OXYCODONE HCL 5 MG PO TABS
5.0000 mg | ORAL_TABLET | Freq: Once | ORAL | Status: DC | PRN
Start: 2012-06-15 — End: 2012-06-15

## 2012-06-15 MED ORDER — POTASSIUM CHLORIDE CRYS ER 20 MEQ PO TBCR: 20.0000 meq | EXTENDED_RELEASE_TABLET | Freq: Every day | ORAL | Status: DC | PRN

## 2012-06-15 MED ORDER — ENOXAPARIN SODIUM 40 MG/0.4ML ~~LOC~~ SOLN
40.0000 mg | SUBCUTANEOUS | Status: DC
  Administered 2012-06-16 – 2012-06-24 (×7): 40 mg via SUBCUTANEOUS
  Filled 2012-06-15 (×9): qty 0.4

## 2012-06-15 MED ORDER — HEPARIN SODIUM (PORCINE) 1000 UNIT/ML IJ SOLN
INTRAMUSCULAR | Status: DC | PRN
  Administered 2012-06-15: 8000 [IU] via INTRAVENOUS

## 2012-06-15 MED ORDER — MORPHINE SULFATE 2 MG/ML IJ SOLN
2.0000 mg | INTRAMUSCULAR | Status: DC | PRN
  Administered 2012-06-15 (×4): 2 mg via INTRAVENOUS
  Administered 2012-06-16: 4 mg via INTRAVENOUS
  Administered 2012-06-16: 2 mg via INTRAVENOUS
  Administered 2012-06-16: 4 mg via INTRAVENOUS
  Filled 2012-06-15 (×2): qty 2
  Filled 2012-06-15 (×4): qty 1

## 2012-06-15 MED ORDER — HYDROMORPHONE HCL PF 1 MG/ML IJ SOLN
INTRAMUSCULAR | Status: AC
  Filled 2012-06-15: qty 1

## 2012-06-15 MED ORDER — LISINOPRIL 20 MG PO TABS
20.0000 mg | ORAL_TABLET | Freq: Every day | ORAL | Status: DC
  Administered 2012-06-16 – 2012-06-24 (×8): 20 mg via ORAL
  Filled 2012-06-15 (×9): qty 1

## 2012-06-15 MED ORDER — DEXTROSE 5 % IV SOLN
INTRAVENOUS | Status: DC | PRN
  Administered 2012-06-15: 09:00:00 via INTRAVENOUS

## 2012-06-15 MED ORDER — LACTATED RINGERS IV SOLN
INTRAVENOUS | Status: DC | PRN
  Administered 2012-06-15 (×2): via INTRAVENOUS

## 2012-06-15 MED ORDER — GUAIFENESIN-DM 100-10 MG/5ML PO SYRP: 15.0000 mL | ORAL_SOLUTION | ORAL | Status: DC | PRN

## 2012-06-15 MED ORDER — ROCURONIUM BROMIDE 100 MG/10ML IV SOLN
INTRAVENOUS | Status: DC | PRN
  Administered 2012-06-15: 50 mg via INTRAVENOUS

## 2012-06-15 MED ORDER — NEOSTIGMINE METHYLSULFATE 1 MG/ML IJ SOLN
INTRAMUSCULAR | Status: DC | PRN
  Administered 2012-06-15: 3 mg via INTRAVENOUS

## 2012-06-15 MED ORDER — LABETALOL HCL 5 MG/ML IV SOLN
10.0000 mg | INTRAVENOUS | Status: DC | PRN
  Filled 2012-06-15: qty 4

## 2012-06-15 MED ORDER — GLYCOPYRROLATE 0.2 MG/ML IJ SOLN
INTRAMUSCULAR | Status: DC | PRN
  Administered 2012-06-15: 0.4 mg via INTRAVENOUS

## 2012-06-15 MED ORDER — SODIUM CHLORIDE 0.9 % IR SOLN
Status: DC | PRN
  Administered 2012-06-15: 09:00:00

## 2012-06-15 MED ORDER — SODIUM CHLORIDE 0.9 % IV SOLN: INTRAVENOUS | Status: DC

## 2012-06-15 MED ORDER — PROPOFOL 10 MG/ML IV BOLUS
INTRAVENOUS | Status: DC | PRN
  Administered 2012-06-15: 20 mg via INTRAVENOUS
  Administered 2012-06-15: 120 mg via INTRAVENOUS
  Administered 2012-06-15: 20 mg via INTRAVENOUS

## 2012-06-15 MED ORDER — OXYCODONE-ACETAMINOPHEN 5-325 MG PO TABS
1.0000 | ORAL_TABLET | ORAL | Status: DC | PRN
  Administered 2012-06-16 – 2012-06-18 (×9): 2 via ORAL
  Administered 2012-06-19: 1 via ORAL
  Administered 2012-06-19: 2 via ORAL
  Administered 2012-06-20 (×2): 1 via ORAL
  Administered 2012-06-21 – 2012-06-24 (×9): 2 via ORAL
  Filled 2012-06-15 (×8): qty 2
  Filled 2012-06-15: qty 1
  Filled 2012-06-15 (×12): qty 2

## 2012-06-15 MED ORDER — ONDANSETRON HCL 4 MG/2ML IJ SOLN: 4.0000 mg | Freq: Four times a day (QID) | INTRAMUSCULAR | Status: DC | PRN

## 2012-06-15 MED ORDER — IODIXANOL 320 MG/ML IV SOLN
INTRAVENOUS | Status: DC | PRN
  Administered 2012-06-15: 100 mL via INTRA_ARTERIAL

## 2012-06-15 MED ORDER — METFORMIN HCL ER 500 MG PO TB24
500.0000 mg | ORAL_TABLET | Freq: Two times a day (BID) | ORAL | Status: DC
  Filled 2012-06-15: qty 1

## 2012-06-15 MED ORDER — THROMBIN 20000 UNITS EX KIT
PACK | CUTANEOUS | Status: DC | PRN
  Administered 2012-06-15: 12:00:00 via TOPICAL

## 2012-06-15 MED ORDER — PANTOPRAZOLE SODIUM 40 MG PO TBEC
40.0000 mg | DELAYED_RELEASE_TABLET | Freq: Every day | ORAL | Status: DC
  Administered 2012-06-15 – 2012-06-16 (×2): 40 mg via ORAL
  Filled 2012-06-15 (×2): qty 1

## 2012-06-15 MED ORDER — OXYCODONE HCL 5 MG/5ML PO SOLN: 5.0000 mg | Freq: Once | ORAL | Status: DC | PRN

## 2012-06-15 MED ORDER — PROTAMINE SULFATE 10 MG/ML IV SOLN
INTRAVENOUS | Status: DC | PRN
  Administered 2012-06-15 (×3): 10 mg via INTRAVENOUS

## 2012-06-15 MED ORDER — HYDROMORPHONE HCL PF 1 MG/ML IJ SOLN
0.2500 mg | INTRAMUSCULAR | Status: DC | PRN
  Administered 2012-06-15 (×2): 0.5 mg via INTRAVENOUS

## 2012-06-15 MED ORDER — LISINOPRIL-HYDROCHLOROTHIAZIDE 20-12.5 MG PO TABS: 1.0000 | ORAL_TABLET | Freq: Every day | ORAL | Status: DC

## 2012-06-15 MED ORDER — SODIUM CHLORIDE 0.9 % IV SOLN
INTRAVENOUS | Status: DC | PRN
  Administered 2012-06-15 (×2): via INTRAVENOUS

## 2012-06-15 MED ORDER — ONDANSETRON HCL 4 MG/2ML IJ SOLN
INTRAMUSCULAR | Status: DC | PRN
  Administered 2012-06-15: 4 mg via INTRAVENOUS

## 2012-06-15 MED ORDER — THROMBIN 20000 UNITS EX SOLR
CUTANEOUS | Status: AC
  Filled 2012-06-15: qty 20000

## 2012-06-15 MED ORDER — SODIUM CHLORIDE 0.9 % IV SOLN
INTRAVENOUS | Status: DC
  Administered 2012-06-15: 15:00:00 via INTRAVENOUS

## 2012-06-15 MED ORDER — DOCUSATE SODIUM 100 MG PO CAPS
100.0000 mg | ORAL_CAPSULE | Freq: Every day | ORAL | Status: DC
  Administered 2012-06-16 – 2012-06-24 (×8): 100 mg via ORAL
  Filled 2012-06-15 (×10): qty 1

## 2012-06-15 SURGICAL SUPPLY — 77 items
ADH SKN CLS LQ APL DERMABOND (GAUZE/BANDAGES/DRESSINGS) ×6
BANDAGE ELASTIC 4 VELCRO ST LF (GAUZE/BANDAGES/DRESSINGS) IMPLANT
BANDAGE ESMARK 6X9 LF (GAUZE/BANDAGES/DRESSINGS) IMPLANT
BNDG CMPR 9X6 STRL LF SNTH (GAUZE/BANDAGES/DRESSINGS)
BNDG ESMARK 6X9 LF (GAUZE/BANDAGES/DRESSINGS)
CANISTER SUCTION 2500CC (MISCELLANEOUS) ×3 IMPLANT
CATH EMB 3FR 40CM (CATHETERS) ×1 IMPLANT
CATH EMB 3FR 80CM (CATHETERS) ×1 IMPLANT
CLIP TI MEDIUM 24 (CLIP) ×3 IMPLANT
CLIP TI WIDE RED SMALL 24 (CLIP) ×3 IMPLANT
CLOTH BEACON ORANGE TIMEOUT ST (SAFETY) ×3 IMPLANT
COVER PROBE W GEL 5X96 (DRAPES) ×3 IMPLANT
COVER SURGICAL LIGHT HANDLE (MISCELLANEOUS) ×3 IMPLANT
CUFF TOURNIQUET SINGLE 18IN (TOURNIQUET CUFF) IMPLANT
CUFF TOURNIQUET SINGLE 24IN (TOURNIQUET CUFF) IMPLANT
CUFF TOURNIQUET SINGLE 34IN LL (TOURNIQUET CUFF) IMPLANT
CUFF TOURNIQUET SINGLE 44IN (TOURNIQUET CUFF) IMPLANT
DERMABOND ADHESIVE PROPEN (GAUZE/BANDAGES/DRESSINGS) ×3
DERMABOND ADVANCED .7 DNX6 (GAUZE/BANDAGES/DRESSINGS) IMPLANT
DRAIN CHANNEL 15F RND FF W/TCR (WOUND CARE) IMPLANT
DRAPE C-ARM 42X72 X-RAY (DRAPES) ×3 IMPLANT
DRAPE WARM FLUID 44X44 (DRAPE) ×3 IMPLANT
DRSG COVADERM 4X10 (GAUZE/BANDAGES/DRESSINGS) IMPLANT
DRSG COVADERM 4X8 (GAUZE/BANDAGES/DRESSINGS) IMPLANT
ELECT REM PT RETURN 9FT ADLT (ELECTROSURGICAL) ×3
ELECTRODE REM PT RTRN 9FT ADLT (ELECTROSURGICAL) ×2 IMPLANT
EVACUATOR SILICONE 100CC (DRAIN) IMPLANT
GAUZE SPONGE 4X4 16PLY XRAY LF (GAUZE/BANDAGES/DRESSINGS) ×2 IMPLANT
GLOVE BIO SURGEON STRL SZ7 (GLOVE) ×6 IMPLANT
GLOVE BIOGEL PI IND STRL 6.5 (GLOVE) IMPLANT
GLOVE BIOGEL PI IND STRL 7.0 (GLOVE) IMPLANT
GLOVE BIOGEL PI IND STRL 7.5 (GLOVE) ×2 IMPLANT
GLOVE BIOGEL PI INDICATOR 6.5 (GLOVE) ×4
GLOVE BIOGEL PI INDICATOR 7.0 (GLOVE) ×2
GLOVE BIOGEL PI INDICATOR 7.5 (GLOVE) ×3
GLOVE ECLIPSE 6.5 STRL STRAW (GLOVE) ×2 IMPLANT
GLOVE ECLIPSE 7.0 STRL STRAW (GLOVE) ×1 IMPLANT
GLOVE SS BIOGEL STRL SZ 6.5 (GLOVE) IMPLANT
GLOVE SUPERSENSE BIOGEL SZ 6.5 (GLOVE) ×1
GOWN STRL NON-REIN LRG LVL3 (GOWN DISPOSABLE) ×14 IMPLANT
INSERT FOGARTY SM (MISCELLANEOUS) IMPLANT
KIT BASIN OR (CUSTOM PROCEDURE TRAY) ×3 IMPLANT
KIT ROOM TURNOVER OR (KITS) ×3 IMPLANT
MARKER GRAFT CORONARY BYPASS (MISCELLANEOUS) IMPLANT
NS IRRIG 1000ML POUR BTL (IV SOLUTION) ×6 IMPLANT
PACK PERIPHERAL VASCULAR (CUSTOM PROCEDURE TRAY) ×3 IMPLANT
PAD ARMBOARD 7.5X6 YLW CONV (MISCELLANEOUS) ×6 IMPLANT
PADDING CAST COTTON 6X4 STRL (CAST SUPPLIES) IMPLANT
SLEEVE SURGEON STRL (DRAPES) ×1 IMPLANT
SPONGE SURGIFOAM ABS GEL 100 (HEMOSTASIS) ×1 IMPLANT
STAPLER VISISTAT 35W (STAPLE) IMPLANT
STOPCOCK MORSE 400PSI 3WAY (MISCELLANEOUS) ×1 IMPLANT
SUT GORETEX 5 0 TT13 24 (SUTURE) IMPLANT
SUT GORETEX 6.0 TT13 (SUTURE) IMPLANT
SUT MNCRL AB 4-0 PS2 18 (SUTURE) ×8 IMPLANT
SUT PROLENE 5 0 C 1 24 (SUTURE) ×4 IMPLANT
SUT PROLENE 6 0 BV (SUTURE) ×4 IMPLANT
SUT PROLENE 7 0 BV 1 (SUTURE) IMPLANT
SUT SILK 2 0 FS (SUTURE) IMPLANT
SUT SILK 2 0 SH (SUTURE) ×3 IMPLANT
SUT SILK 3 0 (SUTURE)
SUT SILK 3-0 18XBRD TIE 12 (SUTURE) IMPLANT
SUT SILK 4 0 (SUTURE) ×3
SUT SILK 4-0 18XBRD TIE 12 (SUTURE) IMPLANT
SUT VIC AB 2-0 CT1 27 (SUTURE) ×6
SUT VIC AB 2-0 CT1 TAPERPNT 27 (SUTURE) ×4 IMPLANT
SUT VIC AB 3-0 SH 27 (SUTURE) ×15
SUT VIC AB 3-0 SH 27X BRD (SUTURE) ×4 IMPLANT
SYR 3ML LL SCALE MARK (SYRINGE) ×1 IMPLANT
TOWEL OR 17X24 6PK STRL BLUE (TOWEL DISPOSABLE) ×6 IMPLANT
TOWEL OR 17X26 10 PK STRL BLUE (TOWEL DISPOSABLE) ×7 IMPLANT
TRAY FOLEY CATH 14FRSI W/METER (CATHETERS) ×3 IMPLANT
TUBING EXTENTION W/L.L. (IV SETS) ×3 IMPLANT
UNDERPAD 30X30 INCONTINENT (UNDERPADS AND DIAPERS) ×3 IMPLANT
WATER STERILE IRR 1000ML POUR (IV SOLUTION) ×3 IMPLANT
WIRE BENTSON .035X145CM (WIRE) ×1 IMPLANT
WIRE MICRO SET SILHO 5FR 7 (SHEATH) ×1 IMPLANT

## 2012-06-15 NOTE — Anesthesia Preprocedure Evaluation (Addendum)
Anesthesia Evaluation  Patient identified by MRN, date of birth, ID band Patient awake    Reviewed: Allergy & Precautions, H&P , NPO status , Patient's Chart, lab work & pertinent test results, reviewed documented beta blocker date and time   Airway Mallampati: II TM Distance: >3 FB Neck ROM: Full    Dental no notable dental hx. (+) Dental Advisory Given, Missing, Poor Dentition and Partial Lower   Pulmonary former smoker,  breath sounds clear to auscultation  Pulmonary exam normal       Cardiovascular hypertension, On Medications + Peripheral Vascular Disease Rhythm:Regular Rate:Normal     Neuro/Psych negative neurological ROS  negative psych ROS   GI/Hepatic Neg liver ROS, hiatal hernia, GERD-  Controlled,  Endo/Other  diabetes, Poorly Controlled, Type 2, Oral Hypoglycemic AgentsPt states he was just dx'd with DM a month ago; thirsty this morning so drank a bottle of water at 4 a.m.  Renal/GU negative Renal ROS  negative genitourinary   Musculoskeletal   Abdominal   Peds  Hematology negative hematology ROS (+)   Anesthesia Other Findings Upper teeth are present but are worn and broken at the gum  Reproductive/Obstetrics negative OB ROS                       Anesthesia Physical Anesthesia Plan  ASA: III  Anesthesia Plan: General   Post-op Pain Management:    Induction: Intravenous  Airway Management Planned: Oral ETT  Additional Equipment:   Intra-op Plan:   Post-operative Plan: Extubation in OR  Informed Consent: I have reviewed the patients History and Physical, chart, labs and discussed the procedure including the risks, benefits and alternatives for the proposed anesthesia with the patient or authorized representative who has indicated his/her understanding and acceptance.   Dental advisory given  Plan Discussed with: CRNA  Anesthesia Plan Comments:         Anesthesia  Quick Evaluation

## 2012-06-15 NOTE — H&P (View-Only) (Signed)
[facility] [place]  History of Present Illness  Javier Quinn is a 52 y.o. male who presents for postoperative follow-up from procedure on Date: dx angiogram: 05/12/12.  On that study, he has R popliteal obliteration from knee down and collateral reconstitution of primarily a peroneal artery.  On the left, he has tibial obliteration with only a residual peroneal distally.  He continues to have severe rest pain: R>>L.  He has been seen by Cardiology and nuclear stress testing is complete: normal.  Past Medical History  Diagnosis Date  . Hypertension   . Diabetes mellitus without complication   . Hyperlipidemia   . Tobacco dependence     History reviewed. No pertinent past surgical history.  History   Social History  . Marital Status: Single    Spouse Name: N/A    Number of Children: N/A  . Years of Education: N/A   Occupational History  . Not on file.   Social History Main Topics  . Smoking status: Former Smoker    Types: Cigarettes    Quit date: 04/11/2012  . Smokeless tobacco: Never Used  . Alcohol Use: Yes     Comment: occ  . Drug Use: No  . Sexually Active: Not on file   Other Topics Concern  . Not on file   Social History Narrative  . No narrative on file    Family History  Problem Relation Age of Onset  . Cancer Mother     pancreas  . Cancer Father     Current Outpatient Prescriptions on File Prior to Visit  Medication Sig Dispense Refill  . aspirin 81 MG tablet Take 1 tablet (81 mg total) by mouth daily.      [name] atorvastatin (LIPITOR) 10 MG tablet Take 10 mg by mouth daily.      . enalapril (VASOTEC) 5 MG tablet Take 5 mg by mouth daily.      . hydrochlorothiazide (HYDRODIURIL) 25 MG tablet Take 25 mg by mouth daily.      . metFORMIN (GLUCOPHAGE-XR) 500 MG 24 hr tablet Take 1 po every day with supper for 1 week, then take 1 po with breakfast and 1 tablet po with supper for 2 weeks, then take 2 tabs po bid AC  120 tablet  3  .  potassium chloride (K-DUR) 10 MEQ tablet Take 10 mEq by mouth 2 (two) times daily.       No current facility-administered medications on file prior to visit.    No Known Allergies  REVIEW OF SYSTEMS: (Positives checked otherwise negative)  CARDIOVASCULAR: []  chest pain, []  chest pressure, []  palpitations, []  shortness of breath when laying flat, []  shortness of breath with exertion, [x]  pain in feet when walking, [x]  pain in feet when laying flat, []  history of blood clot in veins (DVT), []  history of phlebitis, []  swelling in legs, []  varicose veins  PULMONARY: []  productive cough, []  asthma, []  wheezing  NEUROLOGIC: [x]  weakness in arms or legs, [x]  numbness in arms or legs, []  difficulty speaking or slurred speech, []  temporary loss of vision in one eye, []  dizziness  HEMATOLOGIC: []  bleeding problems, []  problems with blood clotting too easily  MUSCULOSKEL: []  joint pain, []  joint swelling  GASTROINTEST: []  vomiting blood, []  blood in stool  GENITOURINARY: []  burning with urination, []  blood in urine  PSYCHIATRIC: []  history of major depression  INTEGUMENTARY: []  rashes, []  ulcers  CONSTITUTIONAL: []  fever, []  chills   For VQI Use Only  PRE-ADM LIVING: Home  AMB STATUS: Ambulatory  CAD Sx: None  PRIOR CHF: None  STRESS TEST: []  No, [x]  Normal, [ ]  + ischemia, [ ]  + MI, [ ]  Both  Physical Examination  Filed Vitals:   06/01/12 1453  BP: 128/82  Pulse: 94  Resp: 18  Height: 6' (1.829 m)  Weight: 224 lb (101.606 kg)   Body mass index is 30.37 kg/(m^2).  General: A&O x 3, WD, obese  Pulmonary: Sym exp, good air movt, CTAB, no rales, rhonchi, & wheezing  Cardiac: RRR, Nl S1, S2, no Murmurs, rubs or gallops  Vascular: Vessel Right Left  Radial Palpable Palpable  Ulnar Palpable Palpable  Brachial Palpable Palpable  Carotid Palpable, without bruit Palpable, without bruit  Aorta Not palpable N/A  Femoral Palpable Palpable  Popliteal Not palpable Not palpable  PT Not  Palpable Not Palpable  DP Not Palpable Not Palpable   Gastrointestinal: soft, NTND, -G/R, - HSM, - masses, - CVAT B, obese, cannot palpate AAA  Musculoskeletal: M/S 5/5 throughout , Extremities without ischemic changes , cool feet, no obvious gangrene   Neurologic: Pain and light touch intact in extremities except toe tips which have decreased sensation, Motor exam as listed above  B GSV Mapping (Date: 06/01/12)  R  GSV: 2.1-8.2 mm (mid thigh 2.1 mm)  L GSV: 2.1-9.2 mm (at calf 2.1 mm)  Medical Decision Making  Javier Quinn is a 52 y.o. male who presents s/p dx angiogram with R>>L rest pain. 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 His only chance at limb salvage on right is R SFA to peroneal bypass.  On the left, he will probably need a L pop to distal peroneal bypass. Unfortunately, his vein conduit isn't optimal bilaterally, so some composite conduit may need to be fashioned. I appears he will be cleared by cardiology based on his stress testing. The risk, benefits, and alternative for bypass operations were discussed with the patient.  The patient is aware the risks include but are not limited to: bleeding, infection, myocardial infarction, stroke, limb loss, nerve damage, need for additional procedures in the future, wound complications, and inability to complete the bypass.   I discussed in depth the importance of his medical management of his multiple high grade co-morbidities. The patient is also aware of the low probability of salvage in his case.   The patient is aware of these risks and agreed to proceed.  He is scheduled for the 11 JUN 14. I discussed in depth with the patient the nature of atherosclerosis, and emphasized the importance of maximal medical management including strict control of blood pressure, blood glucose, and lipid levels, obtaining regular exercise, and cessation of smoking.  The patient is aware that  without maximal medical management the underlying atherosclerotic disease process will progress, limiting the benefit of any interventions.  Thank you for allowing [place] to participate in this patient's care.  Leonides Sake, MD [facility] [place] Office: [phone] Pager: [phone]

## 2012-06-15 NOTE — Transfer of Care (Signed)
Immediate Anesthesia Transfer of Care Note  Patient: Javier Quinn  Procedure(s) Performed: Procedure(s) with comments: BYPASS GRAFT FEMORAL-PERONEAL (Right) - using right non-reversed saphenous vein LOWER EXTREMITY ANGIOGRAM (Right)  Patient Location: PACU  Anesthesia Type:General  Level of Consciousness: awake, alert , oriented and patient cooperative  Airway & Oxygen Therapy: Patient Spontanous Breathing and Patient connected to nasal cannula oxygen  Post-op Assessment: Report given to PACU RN, Post -op Vital signs reviewed and stable and Patient moving all extremities  Post vital signs: Reviewed and stable  Complications: No apparent anesthesia complications

## 2012-06-15 NOTE — Anesthesia Procedure Notes (Signed)
Procedure Name: Intubation Date/Time: 06/15/2012 8:41 AM Performed by: Darcey Nora B Pre-anesthesia Checklist: Patient identified, Patient being monitored, Emergency Drugs available and Suction available Patient Re-evaluated:Patient Re-evaluated prior to inductionOxygen Delivery Method: Circle system utilized Preoxygenation: Pre-oxygenation with 100% oxygen Intubation Type: IV induction Ventilation: Mask ventilation without difficulty Laryngoscope Size: Mac and 4 Tube type: Oral Tube size: 7.5 mm Number of attempts: 1 Airway Equipment and Method: Stylet Placement Confirmation: ETT inserted through vocal cords under direct vision,  breath sounds checked- equal and bilateral and positive ETCO2 Secured at: 21 (cm at gum) cm Tube secured with: Tape Dental Injury: Teeth and Oropharynx as per pre-operative assessment

## 2012-06-15 NOTE — Interval H&P Note (Signed)
[facility] [place]  History and Physical Update  The patient was interviewed and re-examined.  The patient's previous History and Physical has been reviewed and is unchanged.  There is no change in the plan of care: R SFA to peroneal bypass.  The risk, benefits, and alternative for bypass operations were discussed with the patient.  The patient is aware the risks include but are not limited to: bleeding, infection, myocardial infarction, stroke, limb loss, nerve damage, need for additional procedures in the future, wound complications, and inability to complete the bypass.  The patient is aware of these risks and agreed to proceed.  Leonides Sake, MD [facility] [place] Office: [phone] Pager: [phone]  06/15/2012, 8:09 AM

## 2012-06-15 NOTE — Op Note (Signed)
OPERATIVE NOTE   PROCEDURE: 1. Right superficial femoral artery to peroneal artery bypass with ipsilateral non-reverse greater saphenous vein  2. Open superficial femoral artery cannulation 3. Right leg runoff   PRE-OPERATIVE DIAGNOSIS: Bilateral rest pain (right greater than left)  POST-OPERATIVE DIAGNOSIS: same as above   SURGEON: Leonides Sake, MD  ASSISTANT(S): Della Goo, [facility]   ANESTHESIA: general  ESTIMATED BLOOD LOSS: 100 cc  FINDING(S): 1.  Obliterated below-the-knee popliteal artery lumen 2.  Obliterated proximal anterior tibial artery lumen 3.  Diseased distal superficial femoral artery with intact lumen 4.  In-line widely patent bypass from superficial femoral artery to peroneal with runoff via anterior tibial artery and posterior tibial artery  5.  High resistance in tibial vascular bed as evident by reflux from bypass into native superficial femoral artery  6.  Dopplerable anterior tibial artery signal  SPECIMEN(S):  none  INDICATIONS:   Javier Quinn is a 52 y.o. male who presents with bilateral rest pain, right greater left.  The patient's diagnostic angiogram demonstrated right popliteal artery obliteration with patent peroneal artery.  I discussed proceed with a right superficial femoral artery to peroneal bypass with his right greater saphenous vein as an attempt to improve his rest pain.  The risk, benefits, and alternative for bypass operations were discussed with the patient.  The patient is aware the risks include but are not limited to: bleeding, infection, myocardial infarction, stroke, limb loss, nerve damage, need for additional procedures in the future, wound complications, and inability to complete the bypass.  The patient is aware that even with a patent bypass his significant distal tibial disease might limit the response the bypass.  The patient is aware of these risks and agreed to proceed.   DESCRIPTION: After full informed written consent was  obtained, the patient was brought back to the operating room and placed supine upon the operating table.  Prior to induction, the patient was given intravenous antibiotics.  After obtaining adequate anesthesia, the patient was prepped and draped in the standard fashion for a femoral to tibial bypass operation.  Attention was turned to the right calf.  An longitudinal incision was made one finger-width posterior to the tibia.  Using blunt dissection and electrocautery, a plane was developed through the subcutaneous tissue and fascia down to the popliteal space.  The popliteal vein was dissected out and retracted medially and posteriorly.  The below-the-knee popliteal artery was dissected away from lateral popliteal vein.  This popliteal artery was found on exam to be firm but not calcified.  I took down the soleus muscle with blunt dissection and electrocautery to reveal the tibioperoneal trunk and anterior tibial artery.  I dissected out the trifurcation and there was a very short tibioperoneal trunk with attenuated posterior tibial artery.  The peroneal artery felt soft but the anterior tibial artery was quite calcified.  To interrogate the below-the-knee popliteal artery, I first made a transverse arteriotomy.  There was no lumen, so I transected the artery.  This verified there was no lumen and that the entire below-the-knee popliteal artery's lumen was obliterated.  I transected the popliteal near the bifurcation into the anterior tibial artery and tibioperoneal trunk.  I did a limited endarterectomy of this segment of the popliteal artery, everting out calcified plaque from the anterior tibial artery.  There continued to be no lumen in the anterior tibial artery.  I tried to passed a 2 mm dilator and its would not pass and there was no back bleeding.  I tied of the proximal anterior tibial artery and transected it off the bifurcation into the tibioperoneal trunk.  I sharply opened the tibioperoneal trunk into  the peroneal artery with a Potts scissor.  I did a limited endarterectomy of this segment.  There was good back bleeding and a 2 mm dilator passed through this segment easily.  I could not pass the dilator down the posterior tibial artery office due to the small size, though there was some limited backbleeding.  I injected heparinized saline into the peroneal artery and clamp it.    At this point, the patient's right greater saphenous vein was identified under Sonosite guidance.  Skip incisions was made over the greater saphenous vein from the saphenofemoral junction down to mid-thigh.  The vein conduit was found to be adequate with size: 3 mm.  The vein was somewhat thickened distally  Side branches of greater saphenous vein were tied off with 4-0 silk or clipped with small titanium clips.  The saphenofemoral junction was clamped and the the greater saphenous vein was transected.  The saphenofemoral junction was oversewn in a double layer with a running stitch of 5-0 prolene.  The distal extent of this conduit was tied off at the level of mid-thigh and the conduit transected distally.  The harvest vein conduit was soaked in a heparinized saline solution.  A vessel cannula was tied to the distal end of the vein and the vein was tested by hydrodistension.  Leaks in the conduit were repaired with 4-0 silk ties and 7-0 Prolene stitches.  At the end of this process, I felt the conduit to be:  adequate quality with limited distensibility.  At this point, I turned my attention to the distal thigh.  I made a longitudinal incision over Hunter's canal and dissected through the subcutaneous tissue and fascia until I reached the superficial femoral artery just proximal to Hunter's canal.  I dissected out the superficial femoral artery proximally and distally.  The patient was given 8000 units of Heparin intravenously, which was a therapeutic bolus.  In total, 8000 units of Heparin was administrated to achieve and maintain  a therapeutic level of anticoagulation.  After three minutes, I clamped the superficial femoral artery proximally and distally and made an arteriotomy with a 11-blade.  I extended the arteriotomy proximally and distally.  I sharply spatulated the proximal end of the vein conduit to meet the geometry of the arteriotomy.  I sewed the non-reversed vein to the artery in an end-to-side configuration with 5-0 Prolene.  I then lysed the valves with a valvuotomy.  There was a good pulse in the vein conduit at this point with pulsatile bleeding.  I clamped the vein near the anastomosis.    At this point, I bluntly dissected a space between the femoral condyles adjacent to the below-the-knee popliteal vessels.  I bluntly passed the short Gore metal tunneler between the femoral condyles in a subsartorial fashion to the distal superficial femoral artery exposure.  The bullet on the tunneler was removed and then the vein conduit was sewn to the inner cannula with a 2-0 Silk.  I passed the conduit through the metal tunnel, taking care to maintain the orientation of the conduit.    Attention was then turned to the popliteal exposure.   I reset the exposure of the popliteal space.  I let the posterior tibial artery and peroneal arteries backbleed and bleeding continued to be present.  I pulled the conduit to appropriate tension and length,  taking into account straightening out the leg.  I adjusted the length of the conduit sharply.  I spatulated the distal end of the vein conduit sharply to meet the arteriotomy.  I sewed the vein to the combined tibioperoneal trunk and peroneal orifice in an end-to-end fashion with a 6-0 Prolene.  Prior to completing this anastomosis, I backbled both the peroneal and posterior tibial arteries.  I also allowed the graft to bleed antegrade.  I completed this anastomosis in the usual fashion.  At this point, all incisions were washed out and thrombin and gelfoam was placed into both incisions.   The continuous doppler exam demonstrated distally: no signals.  Subsequently, I felt that a completion angiogram was going to be necessary.  I turned my attention to the superficial femoral artery exposure.  I cannulated the vein bypass with a micropuncture needle and pass the microwire into the superficial femoral artery.  The needle was exchanged for the microsheath, which was advanced into the superficial femoral artery.  I removed the dilator and wire and connected the sheath to the IV extension tubing.  A completion angiogram was completed in station with a C-arm.  There appeared to be considerable resistance in the tibial arteries, leading the native superficial femoral artery to fill before the bypass.  I pulled the sheath back into just the bypass conduit and repeated the images.  This demonstrated no technical problems with the bypass graft and in-line flow into the peroneal artery.  The tibial filling was also improved on this injection, suggesting some spasm in the tibial arteries.  Distally on the runoff there was runoff into the foot via the anterior tibial artery and posterior tibial artery.  At this point, I place a Z-stitch with 6-0 Prolene around the sheath and tied the stitch while removing the sheath.     At this point, bleeding all incisions was controlled with electrocautery and suture ligature.  After another round of thrombin and gelfoam, no further active bleeding was present.  I washed out both incisions.  The calf was repaired with interrupted deep sutures to reapproximate the deep muscles and a running stitch of 3-0 Vicryl in the subcutaneous tissue.  The skin was reapproximated with a running subcuticular of 4-0 Monocryl.  After cleaning and drying the skin, the skin was reinforced with Dermabond.  Attention was turned to the superficial femoral artery exposure.  The superficial subcutaneous tissue was reapproximated with a layer of 3-0 Vicryl.  The skin was reapproximated with a  running subcuticular of 4-0 Monocryl.  The skin was cleaned, dried, and reinforced with Dermabond.  The vein harvest incisions were closed with a double layer of 3-0 Vicryl in the subcutaneous tissue.  The skin was then reapproximated with running subcuticular stitches of 4-0 Monocryl.  The skin was cleaned, dried, and sterile bandages applied.  At the end of the case, I was able to obtain an anterior tibial artery signal with a new continuous doppler.   COMPLICATIONS: none  CONDITION: stable   Leonides Sake, MD [facility] Office: [phone] Pager: [phone]  06/15/2012, 1:27 PM

## 2012-06-15 NOTE — Anesthesia Postprocedure Evaluation (Signed)
  Anesthesia Post-op Note  Patient: Javier Quinn  Procedure(s) Performed: Procedure(s) with comments: BYPASS GRAFT FEMORAL-PERONEAL (Right) - using right non-reversed saphenous vein LOWER EXTREMITY ANGIOGRAM (Right)  Patient Location: PACU  Anesthesia Type:General  Level of Consciousness: awake and alert   Airway and Oxygen Therapy: Patient Spontanous Breathing and Patient connected to nasal cannula oxygen  Post-op Pain: mild  Post-op Assessment: Post-op Vital signs reviewed, Patient's Cardiovascular Status Stable, Respiratory Function Stable, Patent Airway and No signs of Nausea or vomiting  Post-op Vital Signs: Reviewed and stable  Complications: No apparent anesthesia complications

## 2012-06-15 NOTE — Progress Notes (Signed)
Patient informed Nurse that he had a bottle of water at 0400. Nurse called Dr. Michelle Piper and notified him of this and Dr. Michelle Piper stated "will we decide when we see him." Will inform patient and brother in law of this.

## 2012-06-15 NOTE — Progress Notes (Signed)
Utilization review completed.  

## 2012-06-15 NOTE — Progress Notes (Signed)
Pt. Admitted to rm 3307 from PACU; vss; pt. In pain- Morphine given; pt. arousable and oriented; Right Groin level 0 incisions clean dry and intact; call light within reach; belongings brought up from PACU; family at bedside; will continue to monitor.  Vivi Martens RN

## 2012-06-15 NOTE — Preoperative (Signed)
Beta Blockers   Reason not to administer Beta Blockers:Not Applicable 

## 2012-06-16 ENCOUNTER — Encounter (HOSPITAL_COMMUNITY): Payer: Self-pay | Admitting: Vascular Surgery

## 2012-06-16 LAB — BASIC METABOLIC PANEL
BUN: 13 mg/dL (ref 6–23)
GFR calc non Af Amer: >90 mL/min (ref >90)
Glucose, Bld: 197 mg/dL — ABNORMAL HIGH (ref 70–99)
Potassium: 4.3 mEq/L (ref 3.5–5.1)

## 2012-06-16 LAB — CBC
HCT: 34.9 % — ABNORMAL LOW (ref 39.0–52.0)
Hemoglobin: 12.1 g/dL — ABNORMAL LOW (ref 13.0–17.0)
MCH: 29.2 pg (ref 26.0–34.0)
MCHC: 34.7 g/dL (ref 30.0–36.0)

## 2012-06-16 LAB — GLUCOSE, CAPILLARY
Glucose-Capillary: 188 mg/dL — ABNORMAL HIGH (ref 70–99)
Glucose-Capillary: 216 mg/dL — ABNORMAL HIGH (ref 70–99)
Glucose-Capillary: 202 mg/dL — ABNORMAL HIGH (ref 70–99)

## 2012-06-16 MED ORDER — CLOPIDOGREL BISULFATE 75 MG PO TABS
75.0000 mg | ORAL_TABLET | Freq: Every day | ORAL | Status: DC
  Administered 2012-06-17 – 2012-06-24 (×8): 75 mg via ORAL
  Filled 2012-06-16 (×10): qty 1

## 2012-06-16 MED ORDER — SODIUM CHLORIDE 0.9 % IV SOLN: INTRAVENOUS | Status: DC

## 2012-06-16 MED ORDER — INSULIN ASPART 100 UNIT/ML ~~LOC~~ SOLN
0.0000 [IU] | Freq: Every day | SUBCUTANEOUS | Status: DC
  Administered 2012-06-16 – 2012-06-17 (×2): 2 [IU] via SUBCUTANEOUS

## 2012-06-16 MED ORDER — FAMOTIDINE 20 MG PO TABS
20.0000 mg | ORAL_TABLET | Freq: Two times a day (BID) | ORAL | Status: DC
  Administered 2012-06-16 – 2012-06-24 (×16): 20 mg via ORAL
  Filled 2012-06-16 (×18): qty 1

## 2012-06-16 MED ORDER — INSULIN ASPART 100 UNIT/ML ~~LOC~~ SOLN
0.0000 [IU] | Freq: Three times a day (TID) | SUBCUTANEOUS | Status: DC
  Administered 2012-06-16: 7 [IU] via SUBCUTANEOUS
  Administered 2012-06-17 (×2): 4 [IU] via SUBCUTANEOUS
  Administered 2012-06-17: 7 [IU] via SUBCUTANEOUS
  Administered 2012-06-18: 4 [IU] via SUBCUTANEOUS
  Administered 2012-06-19: 3 [IU] via SUBCUTANEOUS
  Administered 2012-06-19 – 2012-06-20 (×4): 4 [IU] via SUBCUTANEOUS
  Administered 2012-06-21: 3 [IU] via SUBCUTANEOUS
  Administered 2012-06-21 – 2012-06-22 (×4): 4 [IU] via SUBCUTANEOUS
  Administered 2012-06-22: 3 [IU] via SUBCUTANEOUS
  Administered 2012-06-23 (×2): 4 [IU] via SUBCUTANEOUS
  Administered 2012-06-24 (×2): 3 [IU] via SUBCUTANEOUS

## 2012-06-16 NOTE — Evaluation (Signed)
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  He presents with surgical pain limiting strength and ROM in right LE and decreased tolerance to weight bearing.  He will benefit from skilled PT in the acute setting to maximize independence and allow return home with family assist and HHPT.    PT Assessment  Patient needs continued PT services    Follow Up Recommendations  Home health PT;Supervision/Assistance - 24 hour       Barriers to Discharge None      Equipment Recommendations  Rolling walker with 5" wheels    Recommendations for Other Services   None  Frequency Min 3X/week    Precautions / Restrictions Precautions Precautions: Fall Restrictions Weight Bearing Restrictions: No   Pertinent Vitals/Pain 5/10 in right LE with mobility      Mobility  Bed Mobility Bed Mobility: Not assessed Details for Bed Mobility Assistance: pt up in recliner upon entry Transfers Transfers: Sit to Stand;Stand to Sit Sit to Stand: 4: Min assist Stand to Sit: 4: Min assist Details for Transfer Assistance: cues for technique and hand placement Ambulation/Gait Ambulation/Gait Assistance: 4: Min assist Ambulation Distance (Feet): 10 Feet Assistive device: Rolling walker Ambulation/Gait Assistance Details: very limited weight tolerance on right LE.  Needed cues for UE weight bearing, demonstrated approprate walker spacing Gait Pattern: Step-to pattern;Antalgic;Decreased hip/knee flexion - right;Decreased stance time - right    Exercises General Exercises - Lower Extremity Ankle Circles/Pumps: AAROM;Right;5 reps;Seated Heel Slides: AAROM;Right;5 reps;Seated   PT  Diagnosis: Difficulty walking;Acute pain  PT Problem List: Decreased strength;Decreased range of motion;Decreased activity tolerance;Decreased mobility;Decreased knowledge of use of DME;Pain PT Treatment Interventions: DME instruction;Gait training;Stair training;Balance training;Patient/family education;Functional mobility training;Therapeutic activities;Therapeutic exercise   PT Goals Acute Rehab PT Goals PT Goal Formulation: With patient Time For Goal Achievement: 06/23/12 Potential to Achieve Goals: Good Pt will go Sit to Supine/Side: with modified independence PT Goal: Sit to Supine/Side - Progress: Goal set today Pt will go Sit to Stand: with modified independence PT Goal: Sit to Stand - Progress: Goal set today Pt will go Stand to Sit: with modified independence PT Goal: Stand to Sit - Progress: Goal set today Pt will Ambulate: >150 feet;with modified independence;with least restrictive assistive device PT Goal: Ambulate - Progress: Goal set today Pt will Go Up / Down Stairs: 3-5 stairs;with rail(s);with supervision PT Goal: Up/Down Stairs - Progress: Goal set today Pt will Perform Home Exercise Program: with supervision, verbal cues required/provided PT Goal: Perform Home Exercise Program - Progress: Goal set today  Visit Information  Last PT Received On: 06/16/12 Assistance Needed: +1 PT/OT Co-Evaluation/Treatment: Yes    Subjective Data  Subjective: My foot is swollen under the toes. Patient Stated Goal: To return home with friend or sister to assist   Prior Functioning  Home Living Lives With: Friend(s) Available Help at Discharge: Friend(s) Type of Home: House Home Access: Stairs to enter [employer] of Steps: 4 Entrance Stairs-Rails: Right;Left;Can reach both Home Layout: One level Bathroom Shower/Tub: Forensic scientist: Standard Home Adaptive Equipment: None Additional Comments: states may go stay with sister if needed Prior  Function Level of Independence: Independent Able to Take Stairs?: Yes Vocation:  (mows grass) Communication Communication:  No difficulties    Cognition  Cognition Arousal/Alertness: Awake/alert Behavior During Therapy: WFL for tasks assessed/performed Overall Cognitive Status: Within Functional Limits for tasks assessed    Extremity/Trunk Assessment Right Lower Extremity Assessment RLE ROM/Strength/Tone: Deficits;Due to pain RLE ROM/Strength/Tone Deficits: stiff through ankle DF lacking approx 30 degrees; knee flexion approx 60 degrees seated edge of bed, hip flexion aprox 70-80 Left Lower Extremity Assessment LLE ROM/Strength/Tone: WFL for tasks assessed   Balance Balance Balance Assessed: Yes Static Standing Balance Static Standing - Balance Support: Bilateral upper extremity supported;During functional activity Static Standing - Level of Assistance: 4: Min assist  End of Session PT - End of Session Equipment Utilized During Treatment: Gait belt Activity Tolerance: Patient limited by pain Patient left: in chair;with nursing in room (to wheelchair for transport to floor)  GP     [facility] 06/16/2012, 10:54 AM Sheran Lawless, PT [phone] 06/16/2012

## 2012-06-16 NOTE — Evaluation (Signed)
Occupational Therapy Evaluation Patient Details Name: Javier Quinn MRN: [id number] DOB: Jan 31, 1960 Today's Date: 06/16/2012 Time: 4098-1191 OT Time Calculation (min): 18 min  OT Assessment / Plan / Recommendation Clinical Impression  Pt is recovering from R femoral-peroneal bypass graft.  Mobility and ADL impeded by increased post surgical pain in R LE.  Expect pt will progress well to be able to return home with assistance and DME.  Will follow acutely.    OT Assessment  Patient needs continued OT Services    Follow Up Recommendations  Home health OT    Barriers to Discharge      Equipment Recommendations  3 in 1 bedside comode    Recommendations for Other Services    Frequency  Min 2X/week    Precautions / Restrictions Precautions Precautions: Fall Restrictions Weight Bearing Restrictions: No   Pertinent Vitals/Pain 5/10 R LE, repositioned, premedicated    ADL  Eating/Feeding: Independent Where Assessed - Eating/Feeding: Chair Grooming: Wash/dry hands;Wash/dry face;Set up Where Assessed - Grooming: Unsupported sitting Upper Body Bathing: Minimal assistance Where Assessed - Upper Body Bathing: Unsupported sitting Lower Body Bathing: Moderate assistance Where Assessed - Lower Body Bathing: Unsupported sitting;Supported sit to stand Upper Body Dressing: Set up Where Assessed - Upper Body Dressing: Unsupported sitting Lower Body Dressing: Moderate assistance Where Assessed - Lower Body Dressing: Unsupported sitting;Supported sit to stand Equipment Used: Rolling walker;Gait belt Transfers/Ambulation Related to ADLs: min assist with RW, verbal cues for technique ADL Comments: Pt unable to reach R foot in sitting or release walker for standing activities due to pain.    OT Diagnosis: Generalized weakness;Acute pain  OT Problem List: Decreased activity tolerance;Impaired balance (sitting and/or standing);Decreased knowledge of use of DME or AE;Pain OT Treatment  Interventions: Self-care/ADL training;DME and/or AE instruction;Patient/family education   OT Goals Acute Rehab OT Goals OT Goal Formulation: With patient Time For Goal Achievement: 06/23/12 Potential to Achieve Goals: Good ADL Goals Pt Will Perform Grooming: with supervision;Standing at sink ADL Goal: Grooming - Progress: Goal set today Pt Will Perform Lower Body Bathing: with supervision;Sitting at sink;Standing at sink ADL Goal: Lower Body Bathing - Progress: Goal set today Pt Will Perform Lower Body Dressing: with supervision;Sit to stand from chair ADL Goal: Lower Body Dressing - Progress: Goal set today Pt Will Transfer to Toilet: with supervision;Ambulation;with DME;3-in-1 ADL Goal: Toilet Transfer - Progress: Goal set today Pt Will Perform Toileting - Clothing Manipulation: with supervision;Standing ADL Goal: Toileting - Clothing Manipulation - Progress: Goal set today Pt Will Perform Toileting - Hygiene: with modified independence;Sitting on 3-in-1 or toilet ADL Goal: Toileting - Hygiene - Progress: Goal set today Pt Will Perform Tub/Shower Transfer: Tub transfer;with supervision;Ambulation;with DME (determine optimal DME) ADL Goal: Tub/Shower Transfer - Progress: Goal set today  Visit Information  Last OT Received On: 06/16/12 Assistance Needed: +1 PT/OT Co-Evaluation/Treatment: Yes    Subjective Data  Subjective: "I don't know what my sister has planned for when I leave here." Patient Stated Goal: Return to independence.   Prior Functioning     Home Living Lives With: Friend(s) Available Help at Discharge: Friend(s) Type of Home: House Home Access: Stairs to enter [employer] of Steps: 4 Entrance Stairs-Rails: Right;Left;Can reach both Home Layout: One level Bathroom Shower/Tub: Forensic scientist: Standard Home Adaptive Equipment: None Additional Comments: states may go stay with sister if needed Prior Function Level of  Independence: Independent Able to Take Stairs?: Yes Vocation:  (mows grass) Communication Communication: No difficulties Dominant Hand: Left  Vision/Perception Vision - History Patient Visual Report: No change from baseline   Cognition  Cognition Arousal/Alertness: Awake/alert Behavior During Therapy: WFL for tasks assessed/performed Overall Cognitive Status: Within Functional Limits for tasks assessed    Extremity/Trunk Assessment Right Upper Extremity Assessment RUE ROM/Strength/Tone: WFL for tasks assessed RUE Coordination: WFL - gross/fine motor Left Upper Extremity Assessment LUE ROM/Strength/Tone: WFL for tasks assessed LUE Coordination: WFL - gross/fine motor     Mobility Bed Mobility Bed Mobility: Not assessed Details for Bed Mobility Assistance: pt up in recliner upon entry Transfers Sit to Stand: 4: Min assist Stand to Sit: 4: Min assist Details for Transfer Assistance: cues for technique and hand placement     Exercise General Exercises - Lower Extremity Ankle Circles/Pumps: AAROM;Right;5 reps;Seated Heel Slides: AAROM;Right;5 reps;Seated   Balance Balance Balance Assessed: Yes Static Standing Balance Static Standing - Balance Support: Bilateral upper extremity supported;During functional activity Static Standing - Level of Assistance: 4: Min assist   End of Session OT - End of Session Activity Tolerance: Patient limited by pain Patient left:  (wheelchair) Nurse Communication: Mobility status  GO     Evern Bio 06/16/2012, 11:24 AM [phone]

## 2012-06-16 NOTE — Progress Notes (Signed)
Pt tx 2000 per MD order, pt VSS, pt need post void per foley removal, pt verbalized understanding of tx, pt up to [facility] with PT tol well, report called to receiving RN, all questions answered

## 2012-06-16 NOTE — Progress Notes (Addendum)
Inpatient Diabetes Program Recommendations  AACE/ADA: New Consensus Statement on Inpatient Glycemic Control (2013)  Target Ranges:  Prepandial:   less than 140 mg/dL      Peak postprandial:   less than 180 mg/dL (1-2 hours)      Critically ill patients:  140 - 180 mg/dL   Results for AYAZ, SONDGEROTH (MRN [id number]) as of 06/16/2012 10:24  Ref. Range 06/08/2012 14:01 06/15/2012 16:50 06/16/2012 06:30  Hemoglobin A1C Latest Range: <5.7 %  10.8 (H)   Glucose Latest Range: 70-99 mg/dL 409 (H)  811 (H)  Results for RASHEE, MARSCHALL (MRN [id number]) as of 06/16/2012 10:24  Ref. Range 06/15/2012 06:38 06/15/2012 11:41 06/15/2012 13:38 06/15/2012 16:53 06/15/2012 22:16 06/16/2012 07:56  Glucose-Capillary Latest Range: 70-99 mg/dL 213 (H) 086 (H) 578 (H) 196 (H) 188 (H) 237 (H)    Inpatient Diabetes Program Recommendations Correction (SSI): Please consider increasing Novolog correction to resistant scale and adding Novolog bedtime correction.  Note: Patient has a history of diabetes and takes Metformin 500 mg BID as an outpatient for diabetes management.  Currently, patient is ordered to receive Novolog 0-15 units AC and Metformin 500 mg BID for inpatient glycemic control.  Blood glucose has ranged from 188-270 mg/dl over the past 24 hours.  Please consider increasing Novolog correction to resistant scale and adding Novolog bedtime correction to improve inpatient glycemic control.  Will continue to follow.  06/16/12@11 :43 - Spoke with pt about diabetes. Discussed A1C results (10.8%) with him and explained what an A1C is, basic pathophysiology of DM Type 2, basic home care, importance of checking CBGs and maintaining good CBG control to prevent long-term and short-term complications.  According to the patient he was just diagnosed with diabetes about one month ago and has followed up with Dr. Laural Benes at the [facility] and [facility].  In talking with the patient, he was educated at the time of diagnosis and received  the Living Well with Diabetes booklet and a lot more literature on diabetes management and nutrition.  Patient reports that he drinks sugary drinks (juices and regular soda) daily.  Discussed the importance of following a diabetic diet and cutting out sugars/carbohydrates from sugary drinks.  Requested that the patient allow a dietician to talk with him and he agreed.  Patient is able to verbalize understanding of information discussed and he has no further questions at this time.     Thanks, Orlando Penner, RN, MSN, CCRN Diabetes Coordinator Inpatient Diabetes Program [phone]

## 2012-06-16 NOTE — Progress Notes (Addendum)
[facility] [place]  Progress Note Bypass Surgery  Date of Surgery: 06/15/2012  Procedure(s): right Distal SFA to -PERONEAL bypass with ipsilateral NRGSV graft LOWER EXTREMITY ANGIOGRAM Surgeon: Surgeon(s): Fransisco Hertz, MD  1 Day Post-Op  History of Present Illness  Javier Quinn is a 52 y.o. male who is S/P Right  BYPASS GRAFT FEMORAL-PERONEAL LOWER EXTREMITY ANGIOGRAM. The patient's pre-op symptoms of rest pain are Improved . Patients pain is well controlled.    VASC. LAB Studies: pend       Significant Diagnostic Studies: CBC Lab Results  Component Value Date   WBC PENDING 06/16/2012   HGB 12.1* 06/16/2012   HCT 34.9* 06/16/2012   MCV 84.1 06/16/2012   PLT 179 06/16/2012    Physical Examination  BP Readings from Last 3 Encounters:  06/16/12 131/80  06/16/12 131/80  06/08/12 121/82   Temp Readings from Last 3 Encounters:  06/16/12 98.3 F (36.8 C) Oral  06/16/12 98.3 F (36.8 C) Oral  06/08/12 97.6 F (36.4 C) Oral   SpO2 Readings from Last 3 Encounters:  06/16/12 94%  06/16/12 94%  06/08/12 96%   Pulse Readings from Last 3 Encounters:  06/16/12 79  06/16/12 79  06/08/12 91    Pt is A&O x 3 right lower extremity: Incision/s is/are clean,dry.intact, and  healing without hematoma, erythema or drainage Limb is warm; with good color  Right Ant Tib pulse is monophasic by Doppler Right Peroneal pulse is  monophasic by Doppler  Assessment/Plan: Pt. Doing well Post-op pain is controlled Wounds are healing well PT/OT for ambulation Continue wound care as ordered Transfer to [address]  [name]  06/16/2012 7:33 AM  Addendum  I have independently interviewed and examined the patient, and I agree with the physician assistant's findings.  Pt's incision are all c/d/i.  Right foot is warm with dopplerable AT and PT.  Awaiting ABI.  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  Unfortunately there is no further intervention available until he heals.  Further peroneal OA and PTA might be possible once he has healed his bypass.  Other significant issues:  1.  HTN: further titration as outpatient likely to be needed.  Current control acceptable in perioperative period. 2.  Uncontrolled diabetes with complications (PAD): will make the recommended changes suggested by diabetes coordinator. 3.  Hyperlipidemia: continue Zocor while hosp.  Suspect titration of Lipitor as outpatient will be needed. 4.  Tobacco dependence: pt has quit at this point and states no desire to resume smoking 5.  GERD: change to H2 block to avoid interaction with Plavix 6.  PAD: add Plavix at this point to aid with long-term patency of bypass 7.  DVT prophlaxis: on Lovenox 40 mg [name] qd 8.  Rehab: home PT/OT will be needed per PT/OT  Leonides Sake, MD [facility]: [phone] Pager: [phone]  06/16/2012, 12:54 PM

## 2012-06-17 LAB — GLUCOSE, CAPILLARY
Glucose-Capillary: 163 mg/dL — ABNORMAL HIGH (ref 70–99)
Glucose-Capillary: 226 mg/dL — ABNORMAL HIGH (ref 70–99)

## 2012-06-17 LAB — BASIC METABOLIC PANEL
Calcium: 8.8 mg/dL (ref 8.4–10.5)
Creatinine, Ser: 1.12 mg/dL (ref 0.50–1.35)
GFR calc non Af Amer: 73 mL/min — ABNORMAL LOW (ref >90)
Glucose, Bld: 198 mg/dL — ABNORMAL HIGH (ref 70–99)
Sodium: 134 mEq/L — ABNORMAL LOW (ref 135–145)

## 2012-06-17 LAB — CBC
MCH: 29.3 pg (ref 26.0–34.0)
MCHC: 34.9 g/dL (ref 30.0–36.0)
MCV: 84.1 fL (ref 78.0–100.0)
Platelets: 168 10*3/uL (ref 150–400)

## 2012-06-17 MED ORDER — BISACODYL 5 MG PO TBEC
5.0000 mg | DELAYED_RELEASE_TABLET | Freq: Every day | ORAL | Status: DC | PRN
  Administered 2012-06-17 – 2012-06-20 (×3): 5 mg via ORAL
  Filled 2012-06-17 (×3): qty 1

## 2012-06-17 MED ORDER — INSULIN DETEMIR 100 UNIT/ML ~~LOC~~ SOLN
10.0000 [IU] | Freq: Every day | SUBCUTANEOUS | Status: DC
  Administered 2012-06-17 – 2012-06-24 (×7): 10 [IU] via SUBCUTANEOUS
  Filled 2012-06-17 (×8): qty 0.1

## 2012-06-17 NOTE — Progress Notes (Addendum)
[facility] [place]  Progress Note Bypass Surgery  Date of Surgery: 06/15/2012  Procedure(s): BYPASS GRAFT FEMORAL-PERONEAL LOWER EXTREMITY ANGIOGRAM Surgeon: Surgeon(s): Fransisco Hertz, MD  2 Days Post-Op  History of Present Illness  Javier Quinn is a 52 y.o. male who is S/P Procedure(s): BYPASS GRAFT FEMORAL-PERONEAL LOWER EXTREMITY ANGIOGRAM right.  The patient's pre-op symptoms of pain are Improved . Patients pain is well controlled.    VASC. LAB Studies:        ABI: pending   Imaging: Dg Ang/ext/uni/or Right  06/15/2012   *RADIOLOGY REPORT*  Clinical data:  Bypass graft  INTRAOPERATIVE ARTERIOGRAM  Findings:  A series of six subtracted intraoperative spot images are submitted.  These document occlusion of the popliteal artery at the knee joint.  There is a patent graft to the proximal peroneal artery.  Peroneal appears patent distally with anterior and posterior communicating arteries providing flow across the ankle to the foot.  IMPRESSION: 1.  Mid popliteal artery occlusion. 2.  Patent graft to the   proximal peroneal artery, with contiguous distal runoff.   Original Report Authenticated By: D. Andria Rhein, MD    Significant Diagnostic Studies: CBC Lab Results  Component Value Date   WBC 9.4 06/17/2012   HGB 12.0* 06/17/2012   HCT 34.4* 06/17/2012   MCV 84.1 06/17/2012   PLT 168 06/17/2012    BMET     Component Value Date/Time   NA 134* 06/17/2012 0435   K 4.0 06/17/2012 0435   CL 100 06/17/2012 0435   CO2 26 06/17/2012 0435   GLUCOSE 198* 06/17/2012 0435   BUN 17 06/17/2012 0435   CREATININE 1.12 06/17/2012 0435   CALCIUM 8.8 06/17/2012 0435   GFRNONAA 73* 06/17/2012 0435   GFRAA 84* 06/17/2012 0435    COAG Lab Results  Component Value Date   INR 0.94 06/08/2012   No results found for this basename: PTT    Physical Examination  BP Readings from Last 3 Encounters:  06/17/12 121/80  06/17/12 121/80  06/08/12 121/82   Temp Readings from  Last 3 Encounters:  06/17/12 98.7 F (37.1 C) Oral  06/17/12 98.7 F (37.1 C) Oral  06/08/12 97.6 F (36.4 C) Oral   SpO2 Readings from Last 3 Encounters:  06/17/12 95%  06/17/12 95%  06/08/12 96%   Pulse Readings from Last 3 Encounters:  06/17/12 97  06/17/12 97  06/08/12 91    Pt is A&O x 3 right lower extremity: Incision/s is/are clean,dry.intact, and  healing without hematoma, erythema or drainage Limb is warm; with good color   Assessment/Plan: Pt. Doing well Post-op pain is controlled Wounds are healing well PT/OT for ambulation   [name] [facility]  06/17/2012 7:44 AM  Addendum  I have independently interviewed and examined the patient, and I agree with the physician assistant's findings.  Warm R foot.  Somewhat edematous R leg as expected.  ABI pending today.  Likely home over the weekend or early next week depending on pain control.  Home services being set up.  Leonides Sake, MD [facility] [place] Office: [phone] Pager: [phone]  06/17/2012, 8:05 AM

## 2012-06-17 NOTE — Care Management Note (Signed)
    Page 1 of 2   06/24/2012     4:58:37 PM   CARE MANAGEMENT NOTE 06/24/2012  Patient:  Javier Quinn, Javier Quinn   Account Number:  [id number]  Date Initiated:  06/16/2012  Documentation initiated by:  Vonte Rossin  Subjective/Objective Assessment:   PT S/P FEMORAL PERONEAL BYPASS GRAFT.  PTA, PT INDEPENDENT, LIVES ALONE.     Action/Plan:   PT STATES HOPES TO HAVE ASSISTANCE FROM SISTER AT DC, BUT THIS IS NOT FINALIZED YET.  WILL FOLLOW UP WITH PT ON DC PLAN.   Anticipated DC Date:  06/22/2012   Anticipated DC Plan:  SKILLED NURSING FACILITY  In-house referral  Clinical Social Worker      DC Planning Services  CM consult      Choice offered to / List presented to:             Status of service:  Completed, signed off Medicare Important Message given?   (If response is "NO", the following Medicare IM given date fields will be blank) Date Medicare IM given:   Date Additional Medicare IM given:    Discharge Disposition:  SKILLED NURSING FACILITY  Per UR Regulation:  Reviewed for med. necessity/level of care/duration of stay  If discussed at Long Length of Stay Meetings, dates discussed:    Comments:  06/24/12 Javier Deason,RN,BSN [phone] PT DISCHARGED TO SNF TODAY, PER CSW ARRANGEMENTS.  06/20/12 Javier Gaige,RN,BSN [phone] MET WITH PT TO FINALIZE DC PLANS.  PT STATES HE DOES NOT HAVE 24HR ASSISTANCE AT HOME, AS RECOMMENDED BY PT/OT.  PT STATES LANDLORD HAS CANCER, AND HIS SISTER HAS "HER OWN FAMILY TO TAKE CARE OF."  HE STATES "I NEED TO GO SOMEWHERE FOR A WHILE".  PT STATES HE FEELS HE NEEDS ST-SNF PLACEMENT FOR ASSISTANCE.  WILL CONSULT CSW TO LOOK INTO OPTIONS FOR PT.  SPOKE WITH PHYSICAL THERAPIST, WHO AGREES WITH THIS DC PLAN.  06/17/12 Javier Sligar,RN,BSN [phone] CM REFERRAL FOR HHPT/OT.  PT HAS NO INSURANCE, AND THEREFORE WOULD BE BILLED THE SAME AS MEDICAID.  PT DOES NOT HAVE QUALIFYING DX FOR HOME HEALTH PT/OT, AND THEREFORE IS NOT ELIGIBLE FOR HOME THERAPIES.  PT WOULD  BE ELIGIBLE FOR HHRN, IF DESIRED BY MD.  MD, PLEASE ORDER IF YOU FEEL PT REQUIRES SKILLED NURSING SERVICES AS DC.  WILL ARRANGE.

## 2012-06-17 NOTE — Progress Notes (Signed)
Physical Therapy Treatment Patient Details Name: Javier Quinn MRN: [id number] DOB: 30-Apr-1960 Today's Date: 06/17/2012 Time: 2130-8657 PT Time Calculation (min): 25 min  PT Assessment / Plan / Recommendation Comments on Treatment Session  Pt cont's to be limited by RLE pain.  Did not tolerate weight bearing through RLE with ambulation therefore he opts to do NWBing.      Follow Up Recommendations  Home health PT;Supervision/Assistance - 24 hour     Does the patient have the potential to tolerate intense rehabilitation     Barriers to Discharge        Equipment Recommendations  Rolling walker with 5" wheels    Recommendations for Other Services    Frequency Min 3X/week   Plan Discharge plan remains appropriate    Precautions / Restrictions Precautions Precautions: Fall Restrictions Weight Bearing Restrictions: No   Pertinent Vitals/Pain 10/10 RLE.  RN notified & pain medication administered.      Mobility  Bed Mobility Bed Mobility: Not assessed Transfers Transfers: Sit to Stand;Stand to Sit Sit to Stand: 4: Min assist Stand to Sit: 4: Min guard Details for Transfer Assistance: (A) to achieve standing Ambulation/Gait Ambulation/Gait Assistance: 4: Min assist Ambulation Distance (Feet): 28 Feet (20' + 8' ) Assistive device: Rolling walker Ambulation/Gait Assistance Details: Pt unable to tolerate weight bearing through RLE due to pain therefore opts to use NWBing technique.   (A) for balance & safety as pt tends to be slightly unsteady.   Gait Pattern: Step-to pattern Gait velocity: slow Stairs: No Wheelchair Mobility Wheelchair Mobility: No    Exercises General Exercises - Lower Extremity Ankle Circles/Pumps: AAROM;Right;10 reps Long Arc Quad: AAROM;Strengthening;Right;10 reps Hip Flexion/Marching: AAROM;Strengthening;Right;10 reps Other Exercises Other Exercises: AAROM Rt knee flexion x 10     PT Goals Acute Rehab PT Goals Time For Goal Achievement:  06/23/12 Potential to Achieve Goals: Good Pt will go Sit to Supine/Side: with modified independence Pt will go Sit to Stand: with modified independence PT Goal: Sit to Stand - Progress: Progressing toward goal Pt will go Stand to Sit: with modified independence PT Goal: Stand to Sit - Progress: Progressing toward goal Pt will Ambulate: >150 feet;with modified independence;with least restrictive assistive device PT Goal: Ambulate - Progress: Progressing toward goal Pt will Go Up / Down Stairs: 3-5 stairs;with rail(s);with supervision Pt will Perform Home Exercise Program: with supervision, verbal cues required/provided PT Goal: Perform Home Exercise Program - Progress: Progressing toward goal  Visit Information  Last PT Received On: 06/17/12 Assistance Needed: +1    Subjective Data      Cognition  Cognition Arousal/Alertness: Awake/alert Behavior During Therapy: WFL for tasks assessed/performed Overall Cognitive Status: Within Functional Limits for tasks assessed    Balance     End of Session PT - End of Session Equipment Utilized During Treatment: Gait belt Activity Tolerance: Patient tolerated treatment well     Verdell Face, PTA [phone] 06/17/2012

## 2012-06-17 NOTE — Progress Notes (Signed)
Pt walked with rolling walker x2 assist to room door and back. Pt tolerated ambulation fairly well with a rest break. Back to chair after ambulation. Javier Quinn

## 2012-06-17 NOTE — Progress Notes (Signed)
Inpatient Diabetes Program Recommendations  AACE/ADA: New Consensus Statement on Inpatient Glycemic Control (2013)  Target Ranges:  Prepandial:   less than 140 mg/dL      Peak postprandial:   less than 180 mg/dL (1-2 hours)      Critically ill patients:  140 - 180 mg/dL  Results for TRENELL, CONCANNON (MRN [id number]) as of 06/17/2012 10:24  Ref. Range 06/16/2012 07:56 06/16/2012 11:20 06/16/2012 16:38 06/16/2012 21:05 06/17/2012 06:35  Glucose-Capillary Latest Range: 70-99 mg/dL 295 (H) 284 (H) 132 (H) 202 (H) 204 (H)   Inpatient Diabetes Program Recommendations Insulin - Basal: Add Levemir 10 units Correction (SSI): [name] Thank you  Piedad Climes BSN, RN,CDE Inpatient Diabetes Coordinator [phone] (team pager)

## 2012-06-17 NOTE — Plan of Care (Addendum)
Problem: Food- and Nutrition-Related Knowledge Deficit (NB-1.1) Goal: Nutrition education Formal process to instruct or train a patient/client in a skill or to impart knowledge to help patients/clients voluntarily manage or modify food choices and eating behavior to maintain or improve health.  Outcome: Completed/Met Date Met:  06/17/12  RD consulted for nutrition education regarding diabetes.     Lab Results  Component Value Date    HGBA1C 10.8* 06/15/2012    RD provided "Carbohydrate Counting for People with Diabetes" handout from the Academy of Nutrition and Dietetics. Discussed different food groups and their effects on blood sugar, emphasizing carbohydrate-containing foods. Provided list of carbohydrates and recommended serving sizes of common foods.  Discussed importance of controlled and consistent carbohydrate intake throughout the day. Provided examples of ways to balance meals/snacks and encouraged intake of high-fiber, whole grain complex carbohydrates. Teach back method used.  Expect fair compliance.  Body mass index is 30.82 kg/(m^2). Pt meets criteria for Obesity Class I based on current BMI.  Current diet order is Carbohydrate Modified Medium Calorie, patient is consuming approximately 100% of meals at this time. Labs and medications reviewed. No further nutrition interventions warranted at this time. If additional nutrition issues arise, please re-consult RD.  Maureen Chatters, RD, LDN Pager #: [phone] After-Hours Pager #: [phone]

## 2012-06-17 NOTE — Progress Notes (Signed)
Occupational Therapy Treatment Patient Details Name: Javier Quinn MRN: [id number] DOB: January 14, 1960 Today's Date: 06/17/2012 Time: 4098-1191 OT Time Calculation (min): 13 min  OT Assessment / Plan / Recommendation Comments on Treatment Session Pt progressing toward goals.  Not tolerating weight through RLE due to pain, therefore needing min assist for steadying.    Follow Up Recommendations  Home health OT    Barriers to Discharge       Equipment Recommendations  3 in 1 bedside comode;Tub/shower bench    Recommendations for Other Services    Frequency Min 2X/week   Plan Discharge plan remains appropriate    Precautions / Restrictions Precautions Precautions: Fall Restrictions Weight Bearing Restrictions: No   Pertinent Vitals/Pain See vitals    ADL  Grooming: Performed;Min guard Where Assessed - Grooming: Supported standing Toilet Transfer: Performed;Minimal Dentist Method: Sit to Barista: Comfort height toilet Toileting - Clothing Manipulation and Hygiene: Performed;Min guard Where Assessed - Engineer, mining and Hygiene: Standing Equipment Used: Rolling walker;Gait belt Transfers/Ambulation Related to ADLs: Min assist with RW ~62ft to/from bathroom.  ADL Comments: Pt demo'd toilet transfer at min assist level with min guard for safety to adjust gown before sitting on toilet.    OT Diagnosis:    OT Problem List:   OT Treatment Interventions:     OT Goals ADL Goals Pt Will Perform Grooming: with supervision;Standing at sink ADL Goal: Grooming - Progress: Progressing toward goals Pt Will Transfer to Toilet: with supervision;Ambulation;with DME;3-in-1 ADL Goal: Toilet Transfer - Progress: Progressing toward goals Pt Will Perform Toileting - Clothing Manipulation: with supervision;Standing ADL Goal: Toileting - Clothing Manipulation - Progress: Progressing toward goals  Visit Information  Last OT Received On:  06/17/12 Assistance Needed: +1    Subjective Data      Prior Functioning       Cognition  Cognition Arousal/Alertness: Awake/alert Behavior During Therapy: WFL for tasks assessed/performed Overall Cognitive Status: Within Functional Limits for tasks assessed    Mobility  Bed Mobility Bed Mobility: Not assessed (pt up in chair) Transfers Transfers: Sit to Stand;Stand to Sit Sit to Stand: 4: Min assist;From chair/3-in-1;With armrests;With upper extremity assist;From toilet Stand to Sit: 4: Min assist;To chair/3-in-1;To toilet;With upper extremity assist;With armrests Details for Transfer Assistance: Assist for power up and steadying/balance,    Exercises     Balance     End of Session OT - End of Session Equipment Utilized During Treatment: Gait belt Activity Tolerance: Patient tolerated treatment well Patient left: in chair;with call bell/phone within reach  GO    06/17/2012 Cipriano Mile OTR/L Pager [phone] Office [phone]  Cipriano Mile 06/17/2012, 12:58 PM

## 2012-06-18 LAB — GLUCOSE, CAPILLARY
Glucose-Capillary: 165 mg/dL — ABNORMAL HIGH (ref 70–99)
Glucose-Capillary: 156 mg/dL — ABNORMAL HIGH (ref 70–99)

## 2012-06-18 NOTE — Progress Notes (Signed)
Physical Therapy Treatment Patient Details Name: Javier Quinn MRN: [id number] DOB: August 02, 1960 Today's Date: 06/18/2012 Time: 1478-2956 PT Time Calculation (min): 24 min  PT Assessment / Plan / Recommendation Comments on Treatment Session  Pt continues to be very limited due to 10/10 right LE pain however wanted to get OOB.  Pt reported not having any pain medicine since the AM.  Educated pt on the importance of taking pain medication when needed for pain control to mobilize.  Plan to make sure pt is premedicated prior to next PT session.  Pt still unsure of d/c plans and where pt will be staying.  Pt to ask sister this evening and to inform PT to better plan for d/c and further education need to navigate in pt's environment.     Follow Up Recommendations  Home health PT;Supervision/Assistance - 24 hour (depending if insurance will pay)     Equipment Recommendations  Rolling walker with 5" wheels    Frequency Min 3X/week   Plan Discharge plan remains appropriate    Precautions / Restrictions Precautions Precautions: Fall Restrictions Weight Bearing Restrictions: No   Pertinent Vitals/Pain 10/10 right knee pain    Mobility  Bed Mobility Bed Mobility: Supine to Sit Supine to Sit: 5: Supervision Details for Bed Mobility Assistance: Supervision for safety.  Pt needed extra time to complete OOB with use of rail. Transfers Transfers: Sit to Stand;Stand to Sit Sit to Stand: 4: Min assist;From bed Stand to Sit: 4: Min assist;To chair/3-in-1 Details for Transfer Assistance: (A) to initiate transfer and slowly descend to recliner with cues for hand placement.  Max cues for hand placement.  Pt continues to keep hands on RW. Ambulation/Gait Ambulation/Gait Assistance: 4: Min assist Ambulation Distance (Feet): 8 Feet Assistive device: Rolling walker Ambulation/Gait Assistance Details: Very limited ambulation due to 10/10 right LE pain.  Pt unable to weight bear through right LE at this  time.  Gait Pattern: Step-to pattern Gait velocity: slow Stairs: No Wheelchair Mobility Wheelchair Mobility: No    Exercises     PT Diagnosis:    PT Problem List:   PT Treatment Interventions:     PT Goals Acute Rehab PT Goals PT Goal Formulation: With patient Time For Goal Achievement: 06/23/12 Potential to Achieve Goals: Good Pt will go Sit to Supine/Side: with modified independence PT Goal: Sit to Supine/Side - Progress: Progressing toward goal Pt will go Sit to Stand: with modified independence PT Goal: Sit to Stand - Progress: Progressing toward goal Pt will go Stand to Sit: with modified independence PT Goal: Stand to Sit - Progress: Progressing toward goal Pt will Ambulate: >150 feet;with modified independence;with least restrictive assistive device PT Goal: Ambulate - Progress: Not met  Visit Information  Last PT Received On: 06/18/12 Assistance Needed: +1    Subjective Data  Subjective: "I'm in too much pain. I'm going to have to sit down." Patient Stated Goal: To get pain under control   Cognition  Cognition Arousal/Alertness: Awake/alert Behavior During Therapy: [facility] for tasks assessed/performed Overall Cognitive Status: Within Functional Limits for tasks assessed    Balance     End of Session     GP     Javier Quinn 06/18/2012, 4:37 PM  Javier Quinn, PT DPT [phone]

## 2012-06-18 NOTE — Progress Notes (Addendum)
[facility] Progress Note  06/18/2012 7:30 AM 3 Days Post-Op  Subjective:  No complaints  Afebrile VSS 96% RA  Filed Vitals:   06/18/12 0528  BP: 140/81  Pulse: 92  Temp: 97.5 F (36.4 C)  Resp: 18    Physical Exam: Incisions:  All are c/d/i Extremities:  Right foot is warm and well perfused.  + doppler signals right PT/AT/peroneal;  + edema RLE  CBC    Component Value Date/Time   WBC 9.4 06/17/2012 0435   RBC 4.09* 06/17/2012 0435   HGB 12.0* 06/17/2012 0435   HCT 34.4* 06/17/2012 0435   PLT 168 06/17/2012 0435   MCV 84.1 06/17/2012 0435   MCH 29.3 06/17/2012 0435   MCHC 34.9 06/17/2012 0435   RDW 14.0 06/17/2012 0435   LYMPHSABS 2.0 09/22/2010 1107   MONOABS 0.6 09/22/2010 1107   EOSABS 0.1 09/22/2010 1107   BASOSABS 0.0 09/22/2010 1107    BMET    Component Value Date/Time   NA 134* 06/17/2012 0435   K 4.0 06/17/2012 0435   CL 100 06/17/2012 0435   CO2 26 06/17/2012 0435   GLUCOSE 198* 06/17/2012 0435   BUN 17 06/17/2012 0435   CREATININE 1.12 06/17/2012 0435   CALCIUM 8.8 06/17/2012 0435   GFRNONAA 73* 06/17/2012 0435   GFRAA 84* 06/17/2012 0435    INR    Component Value Date/Time   INR 0.94 06/08/2012 1401     Intake/Output Summary (Last 24 hours) at 06/18/12 0730 Last data filed at 06/18/12 0531  Gross per 24 hour  Intake    840 ml  Output    575 ml  Net    265 ml     Assessment/Plan:  52 y.o. male is s/p:  1. Right superficial femoral artery to peroneal artery bypass with ipsilateral non-reverse greater saphenous vein  2. Open superficial femoral artery cannulation  3. Right leg runoff    3 Days Post-Op  -doing well this am-ambulated 3x yesterday-continue mobilization -elevate RLE -ABI's still pending -DVT prophylaxis:  lovenox   Doreatha Massed, PA-C [facility] [phone] 06/18/2012 7:30 AM    Agree with above assessment Lower extremity bypass graft patent Patient beginning to ambulate-slowly  We'll  work on ambulation over weekend he see him on Monday

## 2012-06-19 DIAGNOSIS — I739 Peripheral vascular disease, unspecified: Secondary | ICD-10-CM

## 2012-06-19 DIAGNOSIS — I70219 Atherosclerosis of native arteries of extremities with intermittent claudication, unspecified extremity: Secondary | ICD-10-CM

## 2012-06-19 LAB — GLUCOSE, CAPILLARY
Glucose-Capillary: 163 mg/dL — ABNORMAL HIGH (ref 70–99)
Glucose-Capillary: 183 mg/dL — ABNORMAL HIGH (ref 70–99)

## 2012-06-19 MED ORDER — BISACODYL 10 MG RE SUPP: 10.0000 mg | Freq: Once | RECTAL | Status: DC

## 2012-06-19 NOTE — Progress Notes (Addendum)
[facility] Progress Note  06/19/2012 7:41 AM 4 Days Post-Op  Subjective:  "I just don't feel good today" States he hasn't had a BM-denies N/V  Afebrile x 24 hrs VSS 98% RA  Filed Vitals:   06/19/12 0420  BP: 158/94  Pulse: 91  Temp: 98.1 F (36.7 C)  Resp: 18    Physical Exam: Incisions:  C/d/i Extremities:  Right foot warm with + doppler signal DP/AT/peroneal; more edema this am   CBC    Component Value Date/Time   WBC 9.4 06/17/2012 0435   RBC 4.09* 06/17/2012 0435   HGB 12.0* 06/17/2012 0435   HCT 34.4* 06/17/2012 0435   PLT 168 06/17/2012 0435   MCV 84.1 06/17/2012 0435   MCH 29.3 06/17/2012 0435   MCHC 34.9 06/17/2012 0435   RDW 14.0 06/17/2012 0435   LYMPHSABS 2.0 09/22/2010 1107   MONOABS 0.6 09/22/2010 1107   EOSABS 0.1 09/22/2010 1107   BASOSABS 0.0 09/22/2010 1107    BMET    Component Value Date/Time   NA 134* 06/17/2012 0435   K 4.0 06/17/2012 0435   CL 100 06/17/2012 0435   CO2 26 06/17/2012 0435   GLUCOSE 198* 06/17/2012 0435   BUN 17 06/17/2012 0435   CREATININE 1.12 06/17/2012 0435   CALCIUM 8.8 06/17/2012 0435   GFRNONAA 73* 06/17/2012 0435   GFRAA 84* 06/17/2012 0435    INR    Component Value Date/Time   INR 0.94 06/08/2012 1401     Intake/Output Summary (Last 24 hours) at 06/19/12 0741 Last data filed at 06/19/12 0300  Gross per 24 hour  Intake    240 ml  Output    400 ml  Net   -160 ml     Assessment/Plan:  52 y.o. male is s/p:  1. Right superficial femoral artery to peroneal artery bypass with ipsilateral non-reverse greater saphenous vein  2. Open superficial femoral artery cannulation  3. Right leg runoff    4 Days Post-Op  -bypass graft is patent -will give dulcolax this am - if this doesn't work, will give mag citrate -ck BMP in am -pt needs to have leg elevated when not ambulating-knees above hips and feet above knees; discussed with RN -did not ambulate yesterday-needs to mobilize more today -DVT prophylaxis:   lovenox   Doreatha Massed, PA-C [facility] [phone] 06/19/2012 7:41 AM    Agree with above assessment We'll continue to work on ambulation and hope to DC home in a.m.

## 2012-06-19 NOTE — Progress Notes (Signed)
VASCULAR LAB PRELIMINARY  ARTERIAL  ABI completed:    RIGHT    LEFT    PRESSURE WAVEFORM  PRESSURE WAVEFORM  BRACHIAL 146 Triphasic BRACHIAL 155 Triphasic  DP  Unable to insonate. DP 45 Dampened monophasic  AT   AT    PT 65 Dampened monophasic PT 61 Dampened monophasic  PER   PER    GREAT TOE  NA GREAT TOE  NA    RIGHT LEFT  ABI 0.42 0.39   Bilateral ABIs are suggestive of severe arterial insufficiency.   06/19/2012 12:52 PM Gertie Fey, RVT, RDCS, RDMS

## 2012-06-19 NOTE — Progress Notes (Signed)
Physical Therapy Treatment Patient Details Name: Javier Quinn MRN: [id number] DOB: 15-Jul-1960 Today's Date: 06/19/2012 Time: 8756-4332 PT Time Calculation (min): 24 min  PT Assessment / Plan / Recommendation Comments on Treatment Session  Patient with improved mobility today, however remains limited by pain    Follow Up Recommendations  Home health PT;Supervision/Assistance - 24 hour     Does the patient have the potential to tolerate intense rehabilitation     Barriers to Discharge        Equipment Recommendations  Rolling walker with 5" wheels    Recommendations for Other Services    Frequency Min 3X/week   Plan Discharge plan remains appropriate    Precautions / Restrictions Precautions Precautions: Fall Restrictions Weight Bearing Restrictions: No   Pertinent Vitals/Pain Pain continues to limit mobility    Mobility  Bed Mobility Bed Mobility: Supine to Sit;Sitting - Scoot to Edge of Bed Supine to Sit: 6: Modified independent (Device/Increase time);With rails Sitting - Scoot to Edge of Bed: 7: Independent Transfers Transfers: Sit to Stand;Stand to Sit Sit to Stand: 4: Min guard;With upper extremity assist;From bed Stand to Sit: 4: Min guard;With upper extremity assist;To bed Details for Transfer Assistance: Verbal cues for hand placement.  Assist for safety/balance Ambulation/Gait Ambulation/Gait Assistance: 4: Min assist Ambulation Distance (Feet): 64 Feet Assistive device: Rolling walker Ambulation/Gait Assistance Details: Verbal cues to try to put weight on RLE - patient maintaining NWB position with RLE due to pain.  Encouraged heel-toe stance with RLE.  Only able to touch foot to floor.  Using UE's on RW to ambulate NWB RLE.  Gait Pattern: Step-to pattern;Decreased stance time - right;Decreased step length - left;Antalgic Gait velocity: slow    Exercises General Exercises - Lower Extremity Ankle Circles/Pumps: AROM;Right;10 reps;Seated Long Arc Quad:  AROM;Right;10 reps;Seated Heel Slides: AROM;Right;5 reps;Seated (Knee flexion to 90 degrees)     PT Goals Acute Rehab PT Goals Pt will go Sit to Stand: with modified independence PT Goal: Sit to Stand - Progress: Progressing toward goal Pt will go Stand to Sit: with modified independence PT Goal: Stand to Sit - Progress: Progressing toward goal Pt will Ambulate: >150 feet;with modified independence;with least restrictive assistive device PT Goal: Ambulate - Progress: Progressing toward goal Pt will Perform Home Exercise Program: with supervision, verbal cues required/provided PT Goal: Perform Home Exercise Program - Progress: Progressing toward goal  Visit Information  Last PT Received On: 06/19/12 Assistance Needed: +1    Subjective Data  Subjective: "My leg is still swollen"   Cognition  Cognition Arousal/Alertness: Awake/alert Behavior During Therapy: WFL for tasks assessed/performed Overall Cognitive Status: Within Functional Limits for tasks assessed    Balance     End of Session PT - End of Session Equipment Utilized During Treatment: Gait belt Activity Tolerance: Patient limited by pain Patient left: in bed;with call bell/phone within reach (sitting at EOB) Nurse Communication: Mobility status;Patient requests pain meds   GP     Vena Austria 06/19/2012, 1:08 PM Durenda Hurt. Renaldo Fiddler, [facility] Pager [phone]

## 2012-06-20 LAB — GLUCOSE, CAPILLARY
Glucose-Capillary: 175 mg/dL — ABNORMAL HIGH (ref 70–99)
Glucose-Capillary: 188 mg/dL — ABNORMAL HIGH (ref 70–99)

## 2012-06-20 LAB — BASIC METABOLIC PANEL
GFR calc Af Amer: >90 mL/min (ref >90)
GFR calc non Af Amer: >90 mL/min (ref >90)
Potassium: 3.7 mEq/L (ref 3.5–5.1)
Sodium: 136 mEq/L (ref 135–145)

## 2012-06-20 MED ORDER — MAGNESIUM CITRATE PO SOLN
300.0000 mL | Freq: Once | ORAL | Status: AC
  Administered 2012-06-20: 300 mL via ORAL
  Filled 2012-06-20: qty 592

## 2012-06-20 NOTE — Progress Notes (Addendum)
[facility] [place]  Progress Note Bypass Surgery  Date of Surgery: 06/15/2012  Procedure(s): BYPASS GRAFT FEMORAL-PERONEAL LOWER EXTREMITY ANGIOGRAM Surgeon: Surgeon(s): Fransisco Hertz, MD  5 Days Post-Op  History of Present Illness  Javier Quinn is a 52 y.o. male who is S/P Procedure(s): BYPASS GRAFT FEMORAL-PERONEAL LOWER EXTREMITY ANGIOGRAM right.  The patient's pre-op symptoms of pain are Improved . Patients pain is well controlled.  No BM, but has passed flatus.  VASC. LAB Studies:        ABI: Right 0.42;  Left 0.39;   Imaging: No results found.  Significant Diagnostic Studies: CBC Lab Results  Component Value Date   WBC 9.4 06/17/2012   HGB 12.0* 06/17/2012   HCT 34.4* 06/17/2012   MCV 84.1 06/17/2012   PLT 168 06/17/2012    BMET     Component Value Date/Time   NA 136 06/20/2012 0500   K 3.7 06/20/2012 0500   CL 99 06/20/2012 0500   CO2 27 06/20/2012 0500   GLUCOSE 163* 06/20/2012 0500   BUN 15 06/20/2012 0500   CREATININE 0.74 06/20/2012 0500   CALCIUM 9.8 06/20/2012 0500   GFRNONAA >90 06/20/2012 0500   GFRAA >90 06/20/2012 0500    COAG Lab Results  Component Value Date   INR 0.94 06/08/2012   No results found for this basename: PTT    Physical Examination  BP Readings from Last 3 Encounters:  06/20/12 148/93  06/20/12 148/93  06/08/12 121/82   Temp Readings from Last 3 Encounters:  06/20/12 97.9 F (36.6 C) Oral  06/20/12 97.9 F (36.6 C) Oral  06/08/12 97.6 F (36.4 C) Oral   SpO2 Readings from Last 3 Encounters:  06/20/12 95%  06/20/12 95%  06/08/12 96%   Pulse Readings from Last 3 Encounters:  06/20/12 87  06/20/12 87  06/08/12 91    Pt is A&O x 3 right lower extremity: Incision/s is/are clean,dry.intact, and  healing without hematoma, erythema or drainage Limb is warm; with good color Doppler signal peroneal, PT, DP right    Assessment/Plan: Pt. Doing well Post-op pain is controlled Wounds are clean,  dry, intact or healing well PT/OT for ambulation Care manager consult, the patient lives alone.  [name] [facility]  06/20/2012 8:03 AM   Addendum  I have independently interviewed and examined the patient, and I agree with the physician assistant's findings.  R foot is warm and swollen.  Incisions c/d/i.  1. HTN: persistent poor BP control.  Further titration as outpatient will be needed 2. Uncontrolled diabetes with complications (PAD): improved gylcemic control with insulin addition, will need titration as outpatient 3. Hyperlipidemia: continue Zocor while hosp. Suspect titration of Lipitor as outpatient will be needed.  4. Tobacco dependence: pt has quit at this point and states no desire to resume smoking  5. GERD: on Pepcid PO 6. PAD: cont Plavix.  Continued leg swelling expected with patent bypass and GSV harvest.  Pt instructed to elevated leg when in bed.  In hospital ABI not too surprising.  Peroneal not listed in ABI, which is the main target so the R ABI is somewhat misleading.  With the severe tibial disease in this patient, there is nothing more to offer immediately.  After 1-2 months, could consider endovascular intervention on R peroneal artery, but not an option currently with fresh surgical bypass. 7. DVT prophlaxis: on Lovenox 40 mg [place] qd  8. Rehab: home PT/OT.  Pt not very motivated to ambulate.  Overall ok to d/c home once home care situation verified and ambulation acceptable.  Leonides Sake, MD [facility] [place] Office: [phone] Pager: [phone]  06/20/2012, 10:01 AM

## 2012-06-20 NOTE — Progress Notes (Signed)
Patient referred to CSW today for ?SNF- spoke with patient who states he lives alone- has a sister he is hoping to get some help from at d/c- discussed possible alternate options (SNF) and he wants to speak with Britta Mccreedy- I have given him my # to have sister call me- Full assessment and further d/c planning to follow-  Reece Levy, MSW, [facility] [phone]

## 2012-06-20 NOTE — Progress Notes (Signed)
Physical Therapy Treatment Patient Details Name: Javier Quinn MRN: [id number] DOB: 1960/04/01 Today's Date: 06/20/2012 Time: 1010-1035 PT Time Calculation (min): 25 min  PT Assessment / Plan / Recommendation Comments on Treatment Session   Patient still limited by pain, although premedicated. Patient states he doesn't have assistance at home and may not be able to stay at his sisters. Will need to continue to follow up to ensure patient has adequate assistance. Suppose to be speaking with his sister today    Follow Up Recommendations  Home health PT;Supervision/Assistance - 24 hour     Does the patient have the potential to tolerate intense rehabilitation     Barriers to Discharge        Equipment Recommendations  Rolling walker with 5" wheels    Recommendations for Other Services    Frequency Min 3X/week   Plan Discharge plan remains appropriate;Frequency remains appropriate    Precautions / Restrictions Precautions Precautions: Fall   Pertinent Vitals/Pain     Mobility  Bed Mobility Bed Mobility: Sit to Supine Sit to Supine: 6: Modified independent (Device/Increase time) Transfers Sit to Stand: 4: Min guard;With upper extremity assist;From chair/3-in-1 Stand to Sit: With upper extremity assist;To bed;4: Min guard Details for Transfer Assistance: Cues for hand placement. Patient tends to fall back to where he is sitting to.  Ambulation/Gait Ambulation/Gait Assistance: 4: Min assist Ambulation Distance (Feet): 80 Feet Assistive device: Rolling walker Ambulation/Gait Assistance Details: Patient continues to not put weight through R LE due to increased pain. Patient with posterior LOB x2 with min A to correct Gait Pattern: Step-to pattern;Decreased stance time - right;Decreased step length - left;Antalgic Gait velocity: slow    Exercises     PT Diagnosis:    PT Problem List:   PT Treatment Interventions:     PT Goals Acute Rehab PT Goals PT Goal: Sit to Stand -  Progress: Progressing toward goal PT Goal: Stand to Sit - Progress: Progressing toward goal PT Goal: Ambulate - Progress: Progressing toward goal  Visit Information  Last PT Received On: 06/20/12 Assistance Needed: +1    Subjective Data      Cognition  Cognition Arousal/Alertness: Awake/alert Behavior During Therapy: WFL for tasks assessed/performed Overall Cognitive Status: Within Functional Limits for tasks assessed    Balance     End of Session PT - End of Session Equipment Utilized During Treatment: Gait belt Activity Tolerance: Patient limited by pain Patient left: in bed;with call bell/phone within reach Nurse Communication: Mobility status   GP     Fredrich Birks 06/20/2012, 12:50 PM 06/20/2012 Fredrich Birks PTA [phone] pager [phone] office

## 2012-06-21 LAB — GLUCOSE, CAPILLARY: Glucose-Capillary: 169 mg/dL — ABNORMAL HIGH (ref 70–99)

## 2012-06-21 MED ORDER — LIVING WELL WITH DIABETES BOOK
Freq: Once | Status: AC
  Administered 2012-06-21: 13:00:00
  Filled 2012-06-21: qty 1

## 2012-06-21 NOTE — Progress Notes (Signed)
Physical Therapy Treatment Patient Details Name: Javier Quinn MRN: [id number] DOB: [birth year] Today's Date: 06/21/2012 Time: 1478-2956 PT Time Calculation (min): 41 min  PT Assessment / Plan / Recommendation Comments on Treatment Session  Pt s/p Right fem to peroneal BPG and progressing with gait and transfers with continued pain in RLE despite premedication. Pt states he does not have assist at home and does not feel he can function independently. Pt would benefit from sT-SNF prior to home to maximize safety and independence will continue to follow and recommended additional ambulation during day with nursing both pt and nursing advised.     Follow Up Recommendations  SNF;Supervision for mobility/OOB (SNF if unable to meet mod I level)     Does the patient have the potential to tolerate intense rehabilitation     Barriers to Discharge        Equipment Recommendations       Recommendations for Other Services    Frequency     Plan Discharge plan needs to be updated;Frequency remains appropriate    Precautions / Restrictions Precautions Precautions: Fall   Pertinent Vitals/Pain 6/10 pain RLE with gait calf and bottom of foot 3/10 at rest    Mobility  Bed Mobility Bed Mobility: Not assessed Transfers Sit to Stand: 5: Supervision;From chair/3-in-1 Stand to Sit: 5: Supervision;To chair/3-in-1 Details for Transfer Assistance: x 3 trials with cueing for hand placement and safety Ambulation/Gait Ambulation/Gait Assistance: 4: Min guard Ambulation Distance (Feet): 90 Feet (35, 45, 90' respectively over 3 trials with seated rest) Assistive device: Rolling walker Ambulation/Gait Assistance Details: cueing for posture, position in RW and increased heel strike. Pt placing right foot flat throughout gait and putting weight through foot with cueing Gait Pattern: Decreased dorsiflexion - right;Step-to pattern Gait velocity: decreased Stairs: Yes Stairs Assistance: 5:  Supervision Stairs Assistance Details (indicate cue type and reason): cueing for sequence Stair Management Technique: One rail Right;Sideways;Step to pattern Number of Stairs: 4    Exercises General Exercises - Lower Extremity Long Arc Quad: AROM;Right;10 reps;Seated Hip Flexion/Marching: AROM;10 reps;Right Toe Raises: AROM;Right;10 reps;Seated   PT Diagnosis:    PT Problem List:   PT Treatment Interventions:     PT Goals Acute Rehab PT Goals PT Goal: Sit to Stand - Progress: Progressing toward goal PT Goal: Stand to Sit - Progress: Progressing toward goal PT Goal: Ambulate - Progress: Progressing toward goal Pt will Go Up / Down Stairs: 3-5 stairs;with rail(s);with modified independence PT Goal: Up/Down Stairs - Progress: Updated due to goal met Pt will Perform Home Exercise Program: Independently PT Goal: Perform Home Exercise Program - Progress: Updated due to goal met  Visit Information  Last PT Received On: 06/21/12 Assistance Needed: +1    Subjective Data  Subjective: I don't know what I'm going to do   Cognition  Cognition Arousal/Alertness: Awake/alert Behavior During Therapy: WFL for tasks assessed/performed Overall Cognitive Status: Within Functional Limits for tasks assessed    Balance     End of Session PT - End of Session Equipment Utilized During Treatment: Gait belt Activity Tolerance: Patient tolerated treatment well Patient left: in chair;with call bell/phone within reach Nurse Communication: Mobility status   GP     Delorse Lek 06/21/2012, 1:10 PM Delaney Meigs, PT [phone]

## 2012-06-21 NOTE — Progress Notes (Signed)
Clinical Social Work Department BRIEF PSYCHOSOCIAL ASSESSMENT 06/21/2012  Patient:  Javier Quinn, Javier Quinn     Account Number:  [id number]     Admit date:  06/15/2012  Clinical Social Worker:  Robin Searing  Date/Time:  06/21/2012 12:56 PM  Referred by:  Physician  Date Referred:  06/21/2012 Referred for  SNF Placement   Other Referral:   Interview type:  Patient Other interview type:   spoke with family members as well- sister and a brother in law    PSYCHOSOCIAL DATA Living Status:  ALONE Admitted from facility:   Level of care:   Primary support name:   Primary support relationship to patient:  FAMILY Degree of support available:   minimal    CURRENT CONCERNS Current Concerns  Post-Acute Placement   Other Concerns:    SOCIAL WORK ASSESSMENT / PLAN Patient admitted from home where he lives alone- he has family but they are not able to give him the assistance/support he will need post hospitalization. Discussed SNF with them and they are agreeable to this-he has no insurance at this time however his family is assisting him with Medicaid application-   Assessment/plan status:  Other - See comment Other assessment/ plan:   FL2 and [place] to be completed for SNF placement   Information/referral to community resources:   SNF  EMS  [facility]  Medicaid    PATIENT'S/FAMILY'S RESPONSE TO PLAN OF CARE: Patient and his family are agreeable to SNF placement at d/c - they understand finding a Medicaid pending SNF bed will be limited- will proceed and advise.       Reece Levy, MSW, [facility] [phone]

## 2012-06-21 NOTE — Progress Notes (Addendum)
[facility] [place]  Progress Note Bypass Surgery  Date of Surgery: 06/15/2012  Procedure(s): BYPASS GRAFT FEMORAL-PERONEAL LOWER EXTREMITY ANGIOGRAM Surgeon: Surgeon(s): Fransisco Hertz, MD  6 Days Post-Op  History of Present Illness  Javier Quinn is a 52 y.o. male who is S/P Procedure(s): BYPASS GRAFT FEMORAL-PERONEAL LOWER EXTREMITY ANGIOGRAM right.  The patient's pre-op symptoms of pain are Improved . Patients pain is well controlled.  He is very slow to progress.  VASC. LAB Studies:        ABI: Right 0.42;  Left 0.39;   Imaging: No results found.  Significant Diagnostic Studies: CBC Lab Results  Component Value Date   WBC 9.4 06/17/2012   HGB 12.0* 06/17/2012   HCT 34.4* 06/17/2012   MCV 84.1 06/17/2012   PLT 168 06/17/2012    BMET     Component Value Date/Time   NA 136 06/20/2012 0500   K 3.7 06/20/2012 0500   CL 99 06/20/2012 0500   CO2 27 06/20/2012 0500   GLUCOSE 163* 06/20/2012 0500   BUN 15 06/20/2012 0500   CREATININE 0.74 06/20/2012 0500   CALCIUM 9.8 06/20/2012 0500   GFRNONAA >90 06/20/2012 0500   GFRAA >90 06/20/2012 0500    COAG Lab Results  Component Value Date   INR 0.94 06/08/2012   No results found for this basename: PTT    Physical Examination  BP Readings from Last 3 Encounters:  06/21/12 122/79  06/21/12 122/79  06/08/12 121/82   Temp Readings from Last 3 Encounters:  06/21/12 97.8 F (36.6 C) Oral  06/21/12 97.8 F (36.6 C) Oral  06/08/12 97.6 F (36.4 C) Oral   SpO2 Readings from Last 3 Encounters:  06/21/12 95%  06/21/12 95%  06/08/12 96%   Pulse Readings from Last 3 Encounters:  06/21/12 83  06/21/12 83  06/08/12 91    Pt is A&O x 3 right lower extremity: Incision/s is/are clean,dry.intact, and  healing without hematoma, erythema or drainage Limb is warm; with good color Edema in right foot instructed AROM    Assessment/Plan: Pt. Doing well Post-op pain is controlled Wounds are healing  well PT/OT for ambulation Home verses SNF  [name] [facility]  06/21/2012 7:34 AM  Addendum  I have independently interviewed and examined the patient, and I agree with the physician assistant's findings.  Exam unchg.  Pt still unmotivated to ambulate.  Home situation has changed, so now needs SNF placement as no help at home.  Awaiting placement  Leonides Sake, MD [facility] [place] Office: [phone] Pager: [phone]  06/21/2012, 8:09 AM

## 2012-06-21 NOTE — Progress Notes (Signed)
Occupational Therapy Treatment Patient Details Name: Javier Quinn MRN: [id number] DOB: [birth year] Today's Date: 06/21/2012 Time: 4098-1191 OT Time Calculation (min): 25 min  OT Assessment / Plan / Recommendation Comments on Treatment Session Pt making good progress. Tolerating at least 50% of weight through R foot at this time. Pt is not at modI level at this time and [facility] at mod I level. Will continue to follow    Follow Up Recommendations  SNF    Barriers to Discharge       Equipment Recommendations  3 in 1 bedside comode;Tub/shower bench    Recommendations for Other Services    Frequency Min 2X/week   Plan Discharge plan needs to be updated    Precautions / Restrictions Precautions Precautions: Fall   Pertinent Vitals/Pain 5. R foot    ADL  Grooming: Performed;Supervision/safety Where Assessed - Grooming: Supported standing (LOB x1) Lower Body Bathing: Minimal assistance Where Assessed - Lower Body Bathing: Supported sit to stand Lower Body Dressing: Minimal assistance Where Assessed - Lower Body Dressing: Unsupported sit to stand Toilet Transfer: Performed;Minimal assistance Toilet Transfer Method: Sit to stand Toilet Transfer Equipment: Bedside commode;Other (comment) (over toilet) Toileting - Clothing Manipulation and Hygiene: Modified independent Where Assessed - Toileting Clothing Manipulation and Hygiene: Standing Equipment Used: Rolling walker;Gait belt Transfers/Ambulation Related to ADLs: minguard ADL Comments: Pt demonstrating improvement    OT Diagnosis:    OT Problem List:   OT Treatment Interventions:     OT Goals Acute Rehab OT Goals OT Goal Formulation: With patient Time For Goal Achievement: 06/23/12 Potential to Achieve Goals: Good ADL Goals Pt Will Perform Grooming: with supervision;Standing at sink ADL Goal: Grooming - Progress: Met Pt Will Perform Lower Body Bathing: with supervision;Sitting at  sink;Standing at sink ADL Goal: Lower Body Bathing - Progress: Progressing toward goals Pt Will Perform Lower Body Dressing: with supervision;Sit to stand from chair ADL Goal: Lower Body Dressing - Progress: Progressing toward goals Pt Will Transfer to Toilet: with supervision;Ambulation;with DME;3-in-1 ADL Goal: Toilet Transfer - Progress: Progressing toward goals Pt Will Perform Toileting - Clothing Manipulation: with supervision;Standing ADL Goal: Toileting - Clothing Manipulation - Progress: Met Pt Will Perform Toileting - Hygiene: with modified independence;Sitting on 3-in-1 or toilet ADL Goal: Toileting - Hygiene - Progress: Met Pt Will Perform Tub/Shower Transfer: Tub transfer;with supervision;Ambulation;with DME ADL Goal: Tub/Shower Transfer - Progress: Progressing toward goals  Visit Information  Last OT Received On: 06/21/12 Assistance Needed: +1    Subjective Data      Prior Functioning       Cognition  Cognition Arousal/Alertness: Awake/alert Behavior During Therapy: WFL for tasks assessed/performed Overall Cognitive Status: Within Functional Limits for tasks assessed    Mobility  Bed Mobility Bed Mobility: Supine to Sit (pt up in chair) Supine to Sit: 6: Modified independent (Device/Increase time) Sitting - Scoot to Edge of Bed: 6: Modified independent (Device/Increase time) Transfers Transfers: Sit to Stand;Stand to Sit Sit to Stand: 4: Min guard Stand to Sit: 4: Min guard Details for Transfer Assistance: good Clinical research associate Exercises - Lower Extremity Long Arc Quad: AROM;Right;10 reps;Seated Hip Flexion/Marching: AROM;10 reps;Right Toe Raises: AROM;Right;10 reps;Seated   Balance Static Standing Balance Static Standing - Balance Support: During functional activity;No upper extremity supported Static Standing - Level of Assistance: 5: Stand by assistance;4: Min assist   End of Session OT - End of Session Equipment Utilized During  Treatment: Gait belt Activity Tolerance: Patient  tolerated treatment well Patient left: in chair;with call bell/phone within reach;with family/visitor present Nurse Communication: Mobility status  GO     Heraclio Seidman,HILLARY 06/21/2012, 3:22 PM [facility], OTR/L  [phone] 06/21/2012

## 2012-06-21 NOTE — Progress Notes (Signed)
Clinical Social Work Department CLINICAL SOCIAL WORK PLACEMENT NOTE 06/21/2012  Patient:  [name], [name]  Account Number:  [id number] Admit date:  06/15/2012  Clinical Social Worker:  Robin Searing  Date/time:  06/21/2012 01:12 PM  Clinical Social Work is seeking post-discharge placement for this patient at the following level of care:   SKILLED NURSING   (*CSW will update this form in Epic as items are completed)   06/21/2012  Patient/family provided with [facility] Department of Clinical Social Work's list of facilities offering this level of care within the geographic area requested by the patient (or if unable, by the patient's family).  06/21/2012  Patient/family informed of their freedom to choose among providers that offer the needed level of care, that participate in Medicare, Medicaid or managed care program needed by the patient, have an available bed and are willing to accept the patient.  06/21/2012  Patient/family informed of [facility]' ownership interest in [facility], as well as of the fact that they are under no obligation to receive care at this facility.  PASARR submitted to EDS on 06/21/2012 PASARR number received from EDS on 06/21/2012  FL2 transmitted to all facilities in geographic area requested by pt/family on  06/21/2012 FL2 transmitted to all facilities within larger geographic area on   Patient informed that his/her managed care company has contracts with or will negotiate with  certain facilities, including the following:   Medicaid pending/ ?LOG     Patient/family informed of bed offers received:   Patient chooses bed at  Physician recommends and patient chooses bed at    Patient to be transferred to  on   Patient to be transferred to facility by   The following physician request were entered in Epic:   Additional Comments: Reece Levy, MSW, Theresia Majors [phone]

## 2012-06-22 LAB — CREATININE, SERUM
Creatinine, Ser: 0.84 mg/dL (ref 0.50–1.35)
GFR calc Af Amer: >90 mL/min (ref >90)
GFR calc non Af Amer: >90 mL/min (ref >90)

## 2012-06-22 LAB — GLUCOSE, CAPILLARY
Glucose-Capillary: 156 mg/dL — ABNORMAL HIGH (ref 70–99)
Glucose-Capillary: 164 mg/dL — ABNORMAL HIGH (ref 70–99)
Glucose-Capillary: 135 mg/dL — ABNORMAL HIGH (ref 70–99)

## 2012-06-22 NOTE — Discharge Summary (Signed)
Duplicated discharge summary.  See original.

## 2012-06-22 NOTE — Progress Notes (Signed)
Physical Therapy Treatment Patient Details Name: Javier Quinn MRN: [id number] DOB: 05/28/1960 Today's Date: 06/22/2012 Time: 4098-1191 PT Time Calculation (min): 30 min  PT Assessment / Plan / Recommendation Comments on Treatment Session  Pt making steady progress wtih mobility and WB with R LE.  Plan is for pt to go SNF.    Follow Up Recommendations  SNF;Supervision for mobility/OOB     Does the patient have the potential to tolerate intense rehabilitation     Barriers to Discharge        Equipment Recommendations       Recommendations for Other Services    Frequency Min 3X/week   Plan Discharge plan needs to be updated;Frequency remains appropriate    Precautions / Restrictions Restrictions Weight Bearing Restrictions: No   Pertinent Vitals/Pain 3/10 R Leg    Mobility  Transfers Sit to Stand: 4: Min guard;5: Supervision Stand to Sit: 4: Min guard;5: Supervision Ambulation/Gait Ambulation/Gait Assistance: 4: Min guard;5: Supervision Ambulation Distance (Feet): 110 Feet (60, 90, 40, 110 feet with sitting rest breaks) Assistive device: Rolling walker Ambulation/Gait Assistance Details: cues for heel to toe pattern Gait Pattern: Step-to pattern;Decreased dorsiflexion - right Stairs Assistance: 5: Supervision Stair Management Technique: One rail Right;Sideways;Step to pattern Number of Stairs: 4    Exercises General Exercises - Lower Extremity Long Arc Quad: AROM;Right;10 reps;Seated Hip ABduction/ADduction: AROM;Right;10 reps;Seated Hip Flexion/Marching: AROM;10 reps;Right Toe Raises: AROM;Right;10 reps;Seated   PT Diagnosis:    PT Problem List:   PT Treatment Interventions:     PT Goals Acute Rehab PT Goals PT Goal: Sit to Stand - Progress: Progressing toward goal PT Goal: Stand to Sit - Progress: Progressing toward goal PT Goal: Ambulate - Progress: Progressing toward goal PT Goal: Up/Down Stairs - Progress: Progressing toward goal  Visit Information  Last PT Received On: 06/22/12 Assistance Needed: +1    Subjective Data  Subjective: You always come right when I get comfortable   Cognition  Cognition Arousal/Alertness: Awake/alert Behavior During Therapy: WFL for tasks assessed/performed Overall Cognitive Status: Within Functional Limits for tasks assessed    Balance     End of Session PT - End of Session Equipment Utilized During Treatment: Gait belt Activity Tolerance: Patient tolerated treatment well Patient left: in chair;with nursing in room Nurse Communication: Mobility status   GP     Javier Quinn 06/22/2012, 11:41 AM

## 2012-06-22 NOTE — Progress Notes (Addendum)
[facility] [place]  Progress Note Bypass Surgery  Date of Surgery: 06/15/2012  Procedure(s): BYPASS GRAFT FEMORAL-PERONEAL LOWER EXTREMITY ANGIOGRAM Surgeon: Surgeon(s): Fransisco Hertz, MD  7 Days Post-Op  History of Present Illness  Javier Quinn is a 52 y.o. male who is S/P Procedure(s): BYPASS GRAFT FEMORAL-PERONEAL LOWER EXTREMITY ANGIOGRAM right.  The patient's pre-op symptoms of pain are Improved . Patients pain is well controlled.    VASC. LAB Studies:        ABI: Right 0.42;  Left 0.39;   Imaging: No results found.  Significant Diagnostic Studies: CBC Lab Results  Component Value Date   WBC 9.4 06/17/2012   HGB 12.0* 06/17/2012   HCT 34.4* 06/17/2012   MCV 84.1 06/17/2012   PLT 168 06/17/2012    BMET     Component Value Date/Time   NA 136 06/20/2012 0500   K 3.7 06/20/2012 0500   CL 99 06/20/2012 0500   CO2 27 06/20/2012 0500   GLUCOSE 163* 06/20/2012 0500   BUN 15 06/20/2012 0500   CREATININE 0.84 06/22/2012 0610   CALCIUM 9.8 06/20/2012 0500   GFRNONAA >90 06/22/2012 0610   GFRAA >90 06/22/2012 0610    COAG Lab Results  Component Value Date   INR 0.94 06/08/2012   No results found for this basename: PTT    Physical Examination  BP Readings from Last 3 Encounters:  06/22/12 118/95  06/22/12 118/95  06/08/12 121/82   Temp Readings from Last 3 Encounters:  06/22/12 97.6 F (36.4 C) Oral  06/22/12 97.6 F (36.4 C) Oral  06/08/12 97.6 F (36.4 C) Oral   SpO2 Readings from Last 3 Encounters:  06/22/12 99%  06/22/12 99%  06/08/12 96%   Pulse Readings from Last 3 Encounters:  06/22/12 87  06/22/12 87  06/08/12 91    Pt is A&O x 3 right lower extremity: Incision/s is/are clean,dry.intact, and  healing without hematoma, erythema or drainage Limb is warm; with good color Doppler right DP/PT Foot edema present  Assessment/Plan: Pt. Doing well Post-op pain is controlled Wounds are healing well PT/OT for  ambulation Pending SNF verses home NPO past MN will check lipid panel in the am he has been on Lipitor/Zocor for > than 4 weeks now.  [name] [facility]  06/22/2012 7:40 AM   Addendum  I have independently interviewed and examined the patient, and I agree with the physician assistant's findings.  Exam unchanged from previous except somewhat improved RLE swelling.  Ambulation better.  Active medical problems included:  1.  HTN: adequate control on current regimen 2.  Uncontrolled DM with complications (PAD):   3.  Uncontrolled Hyperlipidemia: will recheck lipid profile tomorrow AM 4.  Tobacco dependence: pt has stopped at this point 5.  GERD: on H2 block to avoid competion with PPI 6.  PAD: s/p R SFA to peroneal bypass, on Plavix.  There is no further surgical option at this point.  Possible PTA of R peroneal artery in the future, 1-2 months after bypass has healed.  Would repeat ABI in outpatient to see if any interval improvement as the healing progresses.  Leonides Sake, MD [facility] [place] Office: [phone] Pager: [phone]  06/22/2012, 10:20 AM

## 2012-06-23 LAB — GLUCOSE, CAPILLARY
Glucose-Capillary: 121 mg/dL — ABNORMAL HIGH (ref 70–99)
Glucose-Capillary: 188 mg/dL — ABNORMAL HIGH (ref 70–99)

## 2012-06-23 LAB — LIPID PANEL
HDL: 38 mg/dL — ABNORMAL LOW (ref >39)
LDL Cholesterol: 69 mg/dL (ref 0–99)
Triglycerides: 181 mg/dL — ABNORMAL HIGH (ref <150)
VLDL: 36 mg/dL (ref 0–40)

## 2012-06-23 NOTE — Progress Notes (Signed)
[facility] [place]  Daily Progress Note  Assessment/Planning: POD #8 s/p R SFA to peroneal bypass  Active medical problems included:  1. HTN: adequate control on current regimen  2. Uncontrolled DM with complications (PAD): improved control with addition of insulin 3. Uncontrolled Hyperlipidemia: significant improvement, continue Lipitor 10 mg as outpt 4. Tobacco dependence: pt has stopped at this point  5. GERD: on H2 block to avoid competion with PPI  6. PAD: s/p R SFA to peroneal bypass, on Plavix. There is no further surgical option at this point. Possible PTA of R peroneal artery in the future, 1-2 months after bypass has healed. Would repeat ABI in outpatient to see if any interval improvement as the healing progresses.  Overall, continue PT/OT.  Awaiting SNF placement  Subjective  - 8 Days Post-Op  No complaints, walking more  Objective Filed Vitals:   06/22/12 0441 06/22/12 1529 06/22/12 2017 06/23/12 0505  BP: 118/95 132/83 132/88 104/69  Pulse: 87 87 77 74  Temp: 97.6 F (36.4 C) 98.1 F (36.7 C) 97.9 F (36.6 C) 97.5 F (36.4 C)  TempSrc: Oral Oral Oral Oral  Resp: 18 18 18 18   Height:      Weight:      SpO2: 99% 99% 100% 96%    Intake/Output Summary (Last 24 hours) at 06/23/12 0726 Last data filed at 06/23/12 0500  Gross per 24 hour  Intake    480 ml  Output    751 ml  Net   -271 ml    PULM  CTAB CV  RRR GI  soft, NTND VASC  RLE incision c/d/i, warm R foot, swollen 2+ edena  Laboratory CBC    Component Value Date/Time   WBC 9.4 06/17/2012 0435   HGB 12.0* 06/17/2012 0435   HCT 34.4* 06/17/2012 0435   PLT 168 06/17/2012 0435    BMET    Component Value Date/Time   NA 136 06/20/2012 0500   K 3.7 06/20/2012 0500   CL 99 06/20/2012 0500   CO2 27 06/20/2012 0500   GLUCOSE 163* 06/20/2012 0500   BUN 15 06/20/2012 0500   CREATININE 0.84 06/22/2012 0610   CALCIUM 9.8 06/20/2012 0500   GFRNONAA >90 06/22/2012 0610   GFRAA >90  06/22/2012 0610   Lipid Panel     Component Value Date/Time   CHOL 143 06/23/2012 0600   TRIG 181* 06/23/2012 0600   HDL 38* 06/23/2012 0600   CHOLHDL 3.8 06/23/2012 0600   VLDL 36 06/23/2012 0600   LDLCALC 69 06/23/2012 0600     Leonides Sake, MD [facility] [place] Office: [phone] Pager: [phone]  06/23/2012, 7:26 AM

## 2012-06-23 NOTE — Progress Notes (Signed)
Occupational Therapy Treatment Patient Details Name: Javier Quinn MRN: [id number] DOB: Nov 14, 1960 Today's Date: 06/23/2012 Time: 1914-7829 OT Time Calculation (min): 15 min  OT Assessment / Plan / Recommendation Comments on Treatment Session Pt progress toward goals but would benefit from SNF for d/c as he is not at mod I level yet.    Follow Up Recommendations  SNF    Barriers to Discharge       Equipment Recommendations  3 in 1 bedside comode;Tub/shower bench    Recommendations for Other Services    Frequency Min 2X/week   Plan Discharge plan remains appropriate    Precautions / Restrictions     Pertinent Vitals/Pain See vitals    ADL  Grooming: Performed;Wash/dry face;Teeth care;Supervision/safety Where Assessed - Grooming: Supported standing Toilet Transfer: Simulated;Min Pension scheme manager Method:  (ambulating) Acupuncturist:  (chair) Transfers/Ambulation Related to ADLs: min guard with RW    OT Diagnosis:    OT Problem List:   OT Treatment Interventions:     OT Goals Acute Rehab OT Goals OT Goal Formulation: With patient Time For Goal Achievement: 06/30/12 Potential to Achieve Goals: Good ADL Goals Pt Will Perform Grooming: with supervision;Standing at sink ADL Goal: Grooming - Progress: Met Pt Will Transfer to Toilet: with supervision;Ambulation;with DME;3-in-1 ADL Goal: Toilet Transfer - Progress: Progressing toward goals  Visit Information  Last OT Received On: 06/23/12 Assistance Needed: +1    Subjective Data      Prior Functioning       Cognition  Cognition Arousal/Alertness: Awake/alert Behavior During Therapy: WFL for tasks assessed/performed Overall Cognitive Status: Within Functional Limits for tasks assessed    Mobility  Transfers Transfers: Sit to Stand;Stand to Sit Sit to Stand: 4: Min guard;From chair/3-in-1 Stand to Sit: 4: Min guard;To chair/3-in-1    Exercises      Balance     End of Session OT - End of  Session Equipment Utilized During Treatment: Gait belt Activity Tolerance: Patient limited by fatigue Patient left: in chair;with call bell/phone within reach  GO    06/23/2012 Cipriano Mile OTR/L Pager [phone] Office [phone]  Cipriano Mile 06/23/2012, 4:42 PM

## 2012-06-23 NOTE — Progress Notes (Signed)
Continue to work on SNF options for patient- he is Medicaid pending and this will limit SNF options- will advise as progress is made-  Reece Levy, MSW, [employer] [phone]

## 2012-06-24 ENCOUNTER — Other Ambulatory Visit: Payer: Self-pay | Admitting: *Deleted

## 2012-06-24 ENCOUNTER — Telehealth: Payer: Self-pay | Admitting: Vascular Surgery

## 2012-06-24 DIAGNOSIS — I739 Peripheral vascular disease, unspecified: Secondary | ICD-10-CM

## 2012-06-24 DIAGNOSIS — Z48812 Encounter for surgical aftercare following surgery on the circulatory system: Secondary | ICD-10-CM

## 2012-06-24 LAB — GLUCOSE, CAPILLARY
Glucose-Capillary: 131 mg/dL — ABNORMAL HIGH (ref 70–99)
Glucose-Capillary: 140 mg/dL — ABNORMAL HIGH (ref 70–99)

## 2012-06-24 MED ORDER — INSULIN DETEMIR 100 UNIT/ML ~~LOC~~ SOLN: 10.0000 [IU] | Freq: Every day | SUBCUTANEOUS | Status: DC

## 2012-06-24 MED ORDER — OXYCODONE HCL 5 MG PO TABS: 5.0000 mg | ORAL_TABLET | Freq: Four times a day (QID) | ORAL | Status: DC | PRN

## 2012-06-24 MED ORDER — CLOPIDOGREL BISULFATE 75 MG PO TABS: 75.0000 mg | ORAL_TABLET | Freq: Every day | ORAL | Status: AC

## 2012-06-24 NOTE — Telephone Encounter (Addendum)
Message copied by Rosalyn Charters on Fri Jun 24, 2012  9:44 AM ------      Message from: Sharee Pimple      Created: Fri Jun 24, 2012  8:59 AM      Regarding: schedule                   ----- Message -----         From: Dara Lords, PA-C         Sent: 06/24/2012   8:42 AM           To: Sharee Pimple, CMA            S/p      1. Right superficial femoral artery to peroneal artery bypass with ipsilateral non-reverse greater saphenous vein        2. Open superficial femoral artery cannulation      3. Right leg runoff             On 06/15/12.            F/u with Dr. Imogene Burn in 2 weeks.            Thanks,      Lelon Mast ------  notified patient of fu appt. 07-15-12 4pm with dr. Imogene Burn

## 2012-06-24 NOTE — Progress Notes (Signed)
Clinical Social Work Department CLINICAL SOCIAL WORK PLACEMENT NOTE 06/24/2012  Patient:  IDO, WOLLMAN  Account Number:  [id number] Admit date:  06/15/2012  Clinical Social Worker:  Robin Searing  Date/time:  06/21/2012 01:12 PM  Clinical Social Work is seeking post-discharge placement for this patient at the following level of care:   SKILLED NURSING   (*CSW will update this form in Epic as items are completed)   06/21/2012  Patient/family provided with [facility] Department of Clinical Social Work's list of facilities offering this level of care within the geographic area requested by the patient (or if unable, by the patient's family).  06/21/2012  Patient/family informed of their freedom to choose among providers that offer the needed level of care, that participate in Medicare, Medicaid or managed care program needed by the patient, have an available bed and are willing to accept the patient.  06/21/2012  Patient/family informed of [facility]' ownership interest in [facility], as well as of the fact that they are under no obligation to receive care at this facility.  PASARR submitted to EDS on 06/21/2012 PASARR number received from EDS on 06/21/2012  FL2 transmitted to all facilities in geographic area requested by pt/family on  06/21/2012 FL2 transmitted to all facilities within larger geographic area on   Patient informed that his/her managed care company has contracts with or will negotiate with  certain facilities, including the following:   Medicaid pending/ ?LOG     Patient/family informed of bed offers received:  06/24/2012 Patient chooses bed at [facility] Physician recommends and patient chooses bed at    Patient to be transferred to [facility] on  06/24/2012 Patient to be transferred to facility by [facility]  The following physician request were entered in Epic:   Additional Comments: Reece Levy, MSW, Theresia Majors [phone]

## 2012-06-24 NOTE — Progress Notes (Signed)
[facility] Progress Note  06/24/2012 8:03 AM 9 Days Post-Op  Subjective:  Pt states he is doing better; swelling is better  Afebrile VSS 96% RA Glucose 110's-180's past 24 hrs.  131 this am.  Filed Vitals:   06/24/12 0616  BP: 150/64  Pulse: 67  Temp: 98.9 F (37.2 C)  Resp: 18    Physical Exam: Incisions:  All incisions are c/d/i and healing nicely Extremities:  Right foot is warm; still with edema, but better from this past weekend.  CBC    Component Value Date/Time   WBC 9.4 06/17/2012 0435   RBC 4.09* 06/17/2012 0435   HGB 12.0* 06/17/2012 0435   HCT 34.4* 06/17/2012 0435   PLT 168 06/17/2012 0435   MCV 84.1 06/17/2012 0435   MCH 29.3 06/17/2012 0435   MCHC 34.9 06/17/2012 0435   RDW 14.0 06/17/2012 0435   LYMPHSABS 2.0 09/22/2010 1107   MONOABS 0.6 09/22/2010 1107   EOSABS 0.1 09/22/2010 1107   BASOSABS 0.0 09/22/2010 1107    BMET    Component Value Date/Time   NA 136 06/20/2012 0500   K 3.7 06/20/2012 0500   CL 99 06/20/2012 0500   CO2 27 06/20/2012 0500   GLUCOSE 163* 06/20/2012 0500   BUN 15 06/20/2012 0500   CREATININE 0.84 06/22/2012 0610   CALCIUM 9.8 06/20/2012 0500   GFRNONAA >90 06/22/2012 0610   GFRAA >90 06/22/2012 0610    INR    Component Value Date/Time   INR 0.94 06/08/2012 1401     Intake/Output Summary (Last 24 hours) at 06/24/12 0803 Last data filed at 06/24/12 0100  Gross per 24 hour  Intake    720 ml  Output    550 ml  Net    170 ml     Assessment/Plan:  52 y.o. male is s/p:  R SFA to peroneal bypass   9 Days Post-Op  -PAD s/p bypass on Plavix with no further surgical option at this point.  Possible PTA of R peroneal artery in the future, 1-2 months after bypass has healed. Would repeat ABI in outpatient to see if any interval improvement as the healing progresses.  -DM better control with addition of insulin -continue lipitor --DVT prophylaxis:  lovenox -SNF pending as pt is medicaid pending, which makes placement  more difficult. -continue to mobilize-ambulated 3x yesterday   Doreatha Massed, PA-C [facility] [phone] 06/24/2012 8:03 AM

## 2012-06-24 NOTE — Discharge Summary (Signed)
[facility] Discharge Summary  Javier Quinn [birth year] 52 y.o. male  [id number]  Admission Date: 06/15/2012  Discharge Date: 06/24/12  Physician: Fransisco Hertz, MD  Admission Diagnosis: claudication   HPI:   This is a 52 y.o. male who presents for postoperative follow-up from procedure on Date: dx angiogram: 05/12/12. On that study, he has R popliteal obliteration from knee down and collateral reconstitution of primarily a peroneal artery. On the left, he has tibial obliteration with only a residual peroneal distally. He continues to have severe rest pain: R>>L. He has been seen by Cardiology and nuclear stress testing is complete: normal.  Hospital Course:  The patient was admitted to the hospital and taken to the operating room on 06/15/2012 and underwent: 1. Right superficial femoral artery to peroneal artery bypass with ipsilateral non-reverse greater saphenous vein  2. Open superficial femoral artery cannulation  3. Right leg runoff     The pt tolerated the procedure well and was transported to the PACU in good condition.   A diabetic consult was obtained as the pt's A1C was 10.8%.  Pt was just diagnosed with diabetes about a month ago and has followed up with Dr. Laural Benes at the [facility].   A nutrition consult with a dietitian was also obtained for education on diet.  Pt did have levemir added to his regimen.  Pt did have edema to the RLE post operatively.  He was instructed to keep leg elevated when he was not mobilizing.  Post operative ABI's are as follows:  RIGHT    LEFT     PRESSURE  WAVEFORM   PRESSURE  WAVEFORM   BRACHIAL  146  Triphasic  BRACHIAL  155  Triphasic   DP   Unable to insonate.  DP  45  Dampened monophasic   AT    AT     PT  65  Dampened monophasic  PT  61  Dampened monophasic   PER    PER     GREAT TOE   NA  GREAT TOE   NA     RIGHT  LEFT   ABI  0.42  0.39   PAD: s/p R SFA to peroneal bypass, on Plavix. There is no  further surgical option at this point. Possible PTA of R peroneal artery in the future, 1-2 months after bypass has healed. Would repeat ABI in outpatient to see if any interval improvement as the healing progresses.  Pt does not have anyone to stay with him at home and is requiring SNF placement for rehab.  The remainder of the hospital course consisted of increasing mobilization and increasing intake of solids without difficulty.  CBC    Component Value Date/Time   WBC 9.4 06/17/2012 0435   RBC 4.09* 06/17/2012 0435   HGB 12.0* 06/17/2012 0435   HCT 34.4* 06/17/2012 0435   PLT 168 06/17/2012 0435   MCV 84.1 06/17/2012 0435   MCH 29.3 06/17/2012 0435   MCHC 34.9 06/17/2012 0435   RDW 14.0 06/17/2012 0435   LYMPHSABS 2.0 09/22/2010 1107   MONOABS 0.6 09/22/2010 1107   EOSABS 0.1 09/22/2010 1107   BASOSABS 0.0 09/22/2010 1107    BMET    Component Value Date/Time   NA 136 06/20/2012 0500   K 3.7 06/20/2012 0500   CL 99 06/20/2012 0500   CO2 27 06/20/2012 0500   GLUCOSE 163* 06/20/2012 0500   BUN 15 06/20/2012 0500   CREATININE 0.84 06/22/2012 0610  CALCIUM 9.8 06/20/2012 0500   GFRNONAA >90 06/22/2012 0610   GFRAA >90 06/22/2012 0610     Discharge Instructions:   The patient is discharged to home with extensive instructions on wound care and progressive ambulation.  They are instructed not to drive or perform any heavy lifting until returning to see the physician in his office.  Discharge Orders   Future Appointments Provider Department Dept Phone   08/22/2012 10:00 AM Wendall Stade, MD [facility] Main Office [place]) [phone]   Future Orders Complete By Expires     Call MD for:  redness, tenderness, or signs of infection (pain, swelling, bleeding, redness, odor or green/yellow discharge around incision site)  As directed     Call MD for:  severe or increased pain, loss or decreased feeling  in affected limb(s)  As directed     Call MD for:  temperature >100.5  As directed      Discharge wound care:  As directed     Comments:      Shower daily with soap and water starting 06/25/12    Driving Restrictions  As directed     Comments:      No driving for 2 weeks    Lifting restrictions  As directed     Comments:      No heavy lifting for 4 weeks    Resume previous diet  As directed        Discharge Diagnosis:  claudication  Secondary Diagnosis: Patient Active Problem List   Diagnosis Date Noted  . Peripheral vascular disease, unspecified 06/01/2012  . Critical lower limb ischemia 06/01/2012  . Preop cardiovascular exam 05/24/2012  . PVD (peripheral vascular disease) with claudication 05/18/2012  . Atherosclerotic peripheral vascular disease with intermittent claudication 05/18/2012  . Unspecified essential hypertension 05/18/2012  . Pain in joint, ankle and foot 05/11/2012  . Pain in both feet 05/11/2012  . Bilateral swelling of feet 05/11/2012  . Uncontrolled diabetes mellitus 05/01/2012  . Type 2 diabetes mellitus 05/01/2012  . Dyslipidemia associated with type 2 diabetes mellitus 05/01/2012  . History of tobacco abuse 05/01/2012  . Claudication 04/29/2012  . Peripheral vascular disease 04/29/2012  . Hyperglycemia 04/29/2012   Past Medical History  Diagnosis Date  . Hypertension   . Diabetes mellitus without complication   . Hyperlipidemia   . Tobacco dependence   . GERD (gastroesophageal reflux disease)   . H/O hiatal hernia        Medication List    TAKE these medications       acetaminophen 325 MG tablet  Commonly known as:  TYLENOL  Take 650 mg by mouth every 6 (six) hours as needed for pain.     clopidogrel 75 MG tablet  Commonly known as:  PLAVIX  Take 1 tablet (75 mg total) by mouth daily with breakfast.     insulin detemir 100 UNIT/ML injection  Commonly known as:  LEVEMIR  Inject 0.1 mLs (10 Units total) into the skin daily.     lisinopril-hydrochlorothiazide 20-12.5 MG per tablet  Commonly known as:   PRINZIDE,ZESTORETIC  Take 1 tablet by mouth daily.     metFORMIN 500 MG 24 hr tablet  Commonly known as:  GLUCOPHAGE-XR  Take 500 mg by mouth 2 (two) times daily.     oxyCODONE 5 MG immediate release tablet  Commonly known as:  ROXICODONE  Take 1 tablet (5 mg total) by mouth every 6 (six) hours as needed for pain.  pravastatin 80 MG tablet  Commonly known as:  PRAVACHOL  Take 80 mg by mouth daily.      Sliding scale novoLOG:  0-20 Units, Subcutaneous, 3 times daily with meals, Correction coverage: Resistant (obese, steroids) CBG < 70: implement hypoglycemia protocol CBG 70 - 120: 0 units CBG 121 - 150: 3 units CBG 151 - 200: 4 units CBG 201 - 250: 7 units CBG 251 - 300: 11 units CBG 301 - 350: 15 units CBG 351 - 400: 20 units CBG > 400: call MD and obtain STAT lab verification  -5 Units, Subcutaneous, Daily at bedtime, Correction coverage: HS scale CBG < 70: implement hypoglycemia protocol CBG 70 - 120: 0 units CBG 121 - 150: 0 units CBG 151 - 200: 0 units CBG 201 - 250: 2 units CBG 251 - 300: 3 units CBG 301 - 350: 4 units CBG 351 - 400: 5 units CBG > 400: call MD and obtain STAT lab verification   Roxicodone #30 No Refill Levimer Rx given Plavix Rx given 30  6RF  Disposition: SNF  Patient's condition: is Good  Follow up: 1. Dr. Imogene Burn in 2 weeks with ABI's   Doreatha Massed, PA-C [facility] [phone] 06/24/2012  8:43 AM   Addendum  I have independently interviewed and examined the patient, and I agree with the physician assistant's discharge summary.  This patient underwent a right superficial femoral artery to peroneal artery bypass with non-reversed greater saphenous vein.  Unfortunately, his vein conduit was not long enough to jump from the common femoral artery.  His below-the-knee popliteal artery and anterior tibial artery were noted to be obliterated by atherosclerosis intraoperatively.  The only option was a bypass to the  disease peroneal artery / tibioperoneal trunk.  Post-operatively, this patient's multiple co-morbidities have been titrated in the hospital, including his poorly controlled diabetes and hyperlipidemia.  The patient clinical demonstrates evidence of continued improved perfusion in the right foot despite the post-operative ABI.  As the peroneal artery was not insonate, I suspect the post-operative ABI are inaccurate.  Regardless, there are no further options in this patient's right leg.  Unfortunately, the patient's lack of insurance has resulted in an extended hospitalization as his family is unable to care for him at home, subsequent, SNF placement was deemed to be necessary.  The additional period of hospitalization reflects the search for a SNF willing to take a patient with Medicaid pending.  The patient can follow up in two weeks in the office for re-evaluation of his incisions and repeat ABI.  Leonides Sake, MD [facility] [place] Office: [phone] Pager: [phone]  06/24/2012, 5:25 PM     - For VQI Registry use --- Instructions: Press F2 to tab through selections.  Delete question if not applicable.   Post-op:  Wound infection: No  Graft infection: No  Transfusion: No  If yes, n/a units given New Arrhythmia: No Ipsilateral amputation: [x]  no, [ ]  Minor, [ ]  BKA, [ ]  AKA Discharge patency: [ x] Primary, [ ]  Primary assisted, [ ]  Secondary, [ ]  Occluded Patency judged by: [ x] Dopper only, [ ]  Palpable graft pulse, [ ]  Palpable distal pulse, [ ]  ABI inc. > 0.15, [ ]  Duplex Discharge ABI: R 0.42 (likely inaccurate as peroneal artery not insonated), L 0.39 Discharge TBI: R n/a, L n/a D/C Ambulatory Status: Ambulatory with Assistance  Complications: MI: [x ] No, [ ]  Troponin only, [ ]  EKG or Clinical CHF: No Resp failure: [ ]   none, [ ]  Pneumonia, [ ]  Ventilator Chg in renal function: [ ]  none, [ ]  Inc. Cr > 0.5, [ ]  Temp. Dialysis, [ ]  Permanent  dialysis Stroke: [x ] None, [ ]  Minor, [ ]  Major Return to OR: No  Reason for return to OR: [ ]  Bleeding, [ ]  Infection, [ ]  Thrombosis, [ ]  Revision  Discharge medications: Statin use:  Yes ASA use: No Plavix use:  Yes Beta blocker use: No Coumadin use: No

## 2012-06-24 NOTE — Progress Notes (Signed)
Report called to [facility] at [place]

## 2012-06-24 NOTE — Progress Notes (Signed)
Medicaid pending SNF bed offered at [facility] for this patient - he and his family are agreeable to the plan and his brother in law has provided transport to SNF. Reece Levy, MSW, [facility] Majors [phone]

## 2012-06-29 ENCOUNTER — Encounter: Payer: Self-pay | Admitting: Nurse Practitioner

## 2012-06-29 ENCOUNTER — Non-Acute Institutional Stay (SKILLED_NURSING_FACILITY): Payer: Medicaid Other | Admitting: Nurse Practitioner

## 2012-06-29 DIAGNOSIS — E1169 Type 2 diabetes mellitus with other specified complication: Secondary | ICD-10-CM

## 2012-06-29 DIAGNOSIS — IMO0002 Reserved for concepts with insufficient information to code with codable children: Secondary | ICD-10-CM

## 2012-06-29 DIAGNOSIS — I739 Peripheral vascular disease, unspecified: Secondary | ICD-10-CM

## 2012-06-29 DIAGNOSIS — E1165 Type 2 diabetes mellitus with hyperglycemia: Secondary | ICD-10-CM

## 2012-06-29 DIAGNOSIS — E785 Hyperlipidemia, unspecified: Secondary | ICD-10-CM

## 2012-06-29 DIAGNOSIS — IMO0001 Reserved for inherently not codable concepts without codable children: Secondary | ICD-10-CM

## 2012-06-29 DIAGNOSIS — I10 Essential (primary) hypertension: Secondary | ICD-10-CM

## 2012-06-29 NOTE — Progress Notes (Signed)
Patient ID: Javier Quinn, male   DOB: [birth year], 53 y.o.   MRN: [id number]   PCP: Standley Dakins, MD   No Known Allergies  Chief Complaint: follow up hospitalization  HPI:  Patient is a 52 y/o male admitted 06/15/2012 with severe bilateral tibial occlusive disease right worse than left now On 06/15/2012 Dr. Leonides Sake did right superficial femoral artery to peroneal artery bypass with ipsilateral non-reverse greater saphenous vein Open superficial femoral artery cannulation . Right leg runoff He presents with surgical pain limiting strength and ROM in right LE and decreased tolerance to weight bearing. He will benefit from skilled PT in the acute setting to maximize independence and allow return home with family assist and HHPT. On 06/24/2012 patient was discharged to [facility] for rehab. Overall doing well with rehab. Leg is still edemaous which he reports has not gotten worse and there is minimal pain to leg.   Review of Systems:  Review of Systems  Constitutional: Negative for fever and chills.  Respiratory: Negative for cough, sputum production, shortness of breath and wheezing.   Cardiovascular: Positive for leg swelling (to right leg after surgery). Negative for chest pain, palpitations and orthopnea.  Gastrointestinal: Negative for abdominal pain, diarrhea and constipation.  Genitourinary: Negative for dysuria, urgency and frequency.  Musculoskeletal: Negative for myalgias, joint pain and falls.  Skin: Negative.   Neurological: Positive for tingling (numbness and tingling in right leg). Negative for dizziness and headaches.  Psychiatric/Behavioral: Negative for depression and memory loss. The patient does not have insomnia.      Past Medical History  Diagnosis Date  . Hypertension   . Diabetes mellitus without complication   . Hyperlipidemia   . Tobacco dependence   . GERD (gastroesophageal reflux disease)   . H/O hiatal hernia    Past Surgical History  Procedure  Laterality Date  . Bypass graft femoral-peroneal Right 06/15/2012    Procedure: BYPASS GRAFT [place];  Surgeon: Fransisco Hertz, MD;  Location: [facility] OR;  Service: Vascular;  Laterality: Right;  using right non-reversed saphenous vein  . Lower extremity angiogram Right 06/15/2012    Procedure: LOWER EXTREMITY ANGIOGRAM;  Surgeon: Fransisco Hertz, MD;  Location: [facility] OR;  Service: Vascular;  Laterality: Right;   Social History:   reports that he quit smoking about 2 months ago. His smoking use included Cigarettes. He has a 10 pack-year smoking history. He has quit using smokeless tobacco. He reports that  drinks alcohol. He reports that he does not use illicit drugs.  Family History  Problem Relation Age of Onset  . Cancer Mother     pancreas  . Cancer Father     Medications: Patient's Medications  New Prescriptions   No medications on file  Previous Medications   ACETAMINOPHEN (TYLENOL) 325 MG TABLET    Take 650 mg by mouth every 6 (six) hours as needed for pain.   CLOPIDOGREL (PLAVIX) 75 MG TABLET    Take 1 tablet (75 mg total) by mouth daily with breakfast.   INSULIN DETEMIR (LEVEMIR) 100 UNIT/ML INJECTION    Inject 0.1 mLs (10 Units total) into the skin daily.   LISINOPRIL-HYDROCHLOROTHIAZIDE (PRINZIDE,ZESTORETIC) 20-12.5 MG PER TABLET    Take 1 tablet by mouth daily.   METFORMIN (GLUCOPHAGE-XR) 500 MG 24 HR TABLET    Take 500 mg by mouth 2 (two) times daily.   OXYCODONE (ROXICODONE) 5 MG IMMEDIATE RELEASE TABLET    Take 1 tablet (5 mg total) by mouth every 6 (six) hours  as needed for pain.   PRAVASTATIN (PRAVACHOL) 80 MG TABLET    Take 80 mg by mouth daily.  Modified Medications   No medications on file  Discontinued Medications   No medications on file     Physical Exam:  Filed Vitals:   06/29/12 1315  BP: 119/79  Pulse: 80  Temp: 98.1 F (36.7 C)  Resp: 18   Physical Exam  Nursing note and vitals reviewed. Constitutional: He is oriented to person, place, and time and  well-developed, well-nourished, and in no distress. No distress.  HENT:  Head: Normocephalic and atraumatic.  Eyes: Conjunctivae and EOM are normal. Pupils are equal, round, and reactive to light.  Neck: Normal range of motion. Neck supple. No thyromegaly present.  Cardiovascular: Normal rate, regular rhythm and normal heart sounds.   Pulmonary/Chest: Effort normal and breath sounds normal. No respiratory distress.  Abdominal: Soft. Bowel sounds are normal.  Musculoskeletal: He exhibits edema.  Plus 3 edema on right; right leg incision- no signs of drainage or heat  Neurological: He is alert and oriented to person, place, and time.  Skin: Skin is warm and dry. He is not diaphoretic.  Psychiatric: Affect normal.      Labs reviewed: Basic Metabolic Panel:  Recent Labs  47/82/95 0630 06/17/12 0435 06/20/12 0500 06/22/12 0610  NA 136 134* 136  --   K 4.3 4.0 3.7  --   CL 101 100 99  --   CO2 27 26 27   --   GLUCOSE 197* 198* 163*  --   BUN 13 17 15   --   CREATININE 0.88 1.12 0.74 0.84  CALCIUM 8.4 8.8 9.8  --    Liver Function Tests:  Recent Labs  04/29/12 1157 06/08/12 1401  AST 26 27  ALT 32 28  ALKPHOS 93 90  BILITOT 0.3 0.2*  PROT 7.7 7.3  ALBUMIN 4.3 4.1   No results found for this basename: LIPASE, AMYLASE,  in the last 8760 hours No results found for this basename: AMMONIA,  in the last 8760 hours CBC:  Recent Labs  06/15/12 1650 06/16/12 0630 06/17/12 0435  WBC 10.3 9.5 9.4  HGB 12.4* 12.1* 12.0*  HCT 35.7* 34.9* 34.4*  MCV 83.0 84.1 84.1  PLT 176 179 168   Cardiac Enzymes: No results found for this basename: CKTOTAL, CKMB, CKMBINDEX, TROPONINI,  in the last 8760 hours BNP: No components found with this basename: POCBNP,  CBG:  Recent Labs  06/23/12 2046 06/24/12 0623 06/24/12 1105  GLUCAP 115* 131* 140*    Consultants:  Dr. Leonides Sake Vascular   Assessment/Plan  1.   Peripheral vascular disease 443.9     S/p R SFA to peroneal  bypass- cont follow up with vein and vascular  Cont plavix   2.   Uncontrolled diabetes mellitus 250.02     Cont levemir and metformin- will follow CBGs   3.   Unspecified essential hypertension 401.9     Cont lisinopril and hctz. Will follow blood pressure   4.   Dyslipidemia associated with type 2 diabetes mellitus   To cont pravastatin   Pt is currently homeless with no family support- at this time in rehab for strength and wound care of right leg

## 2012-07-01 NOTE — Progress Notes (Signed)
Date: 07/01/2012  MRN:  [id number] Name:  Javier Quinn Sex:  male Age:  52 y.o. DOB:[birth year]                       Facility/Room; [facility] Level Of Care:SNF Provider: Dr. Murray Hodgkins   Emergency Contacts: Contact Information   Name Relation Home Work Mobile   Seminara,Phylicia Sister [phone]     Melton,William Other [phone]        Code Status: Full Code MOST Form:  Allergies:No Known Allergies   Chief Complaint  Patient presents with  . 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 Leonides Sake did right superficial femoral artery to peroneal artery bypass with ipsilateral non-reverse greater saphenous vein  Open superficial femoral artery cannulation . Right leg runoff  He presents with surgical pain limiting strength and ROM in right LE and decreased tolerance to weight bearing. He will benefit from skilled PT in the acute setting to maximize independence and allow return home with family assist and HHPT. On 06/24/2012 patient was discharged to [facility] for rehab.    Past Medical History  Diagnosis Date  . Hypertension   . Diabetes mellitus without complication   . Hyperlipidemia   . Tobacco dependence   . GERD (gastroesophageal reflux disease)   . H/O hiatal hernia     Past Surgical History  Procedure Laterality Date  . Bypass graft femoral-peroneal Right 06/15/2012    Procedure: BYPASS GRAFT [place];  Surgeon: Fransisco Hertz, MD;  Location: [facility] OR;  Service: Vascular;  Laterality: Right;  using right non-reversed saphenous vein  . Lower extremity angiogram Right 06/15/2012    Procedure: LOWER EXTREMITY ANGIOGRAM;  Surgeon: Fransisco Hertz, MD;  Location: [facility] OR;  Service: Vascular;  Laterality: Right;     Procedures:  06/15/2012 Intraoperative  artereogram Mid popliteal artery occlusion.  Patent graft to the proximal peroneal artery, with contiguous   distal runoff  Consultants:  Dr. Leonides Sake Vascular  Current Outpatient Prescriptions  Medication Sig Dispense Refill  . acetaminophen (TYLENOL) 325 MG tablet Take 650 mg by mouth every 6 (six) hours as needed for pain.      [name] clopidogrel (PLAVIX) 75 MG tablet Take 1 tablet (75 mg total) by mouth daily with breakfast.  30 tablet  6  . insulin detemir (LEVEMIR) 100 UNIT/ML injection Inject 0.1 mLs (10 Units total) into the skin daily.  10 mL  12  . lisinopril-hydrochlorothiazide (PRINZIDE,ZESTORETIC) 20-12.5 MG per tablet Take 1 tablet by mouth daily.      . metFORMIN (GLUCOPHAGE-XR) 500 MG 24 hr tablet Take 500 mg by mouth 2 (two) times daily.      [name] oxyCODONE (ROXICODONE) 5 MG immediate release tablet Take 1 tablet (5 mg total) by mouth every 6 (six) hours as needed for pain.  30 tablet  0  . pravastatin (PRAVACHOL) 80 MG tablet Take 80 mg by mouth daily.       No current facility-administered medications for this visit.     There is no immunization history on file for this patient.   Diet:  History  Substance Use Topics  . Smoking status: Former Smoker -- 0.50 packs/day for 20 years    Types: Cigarettes    Quit date: 04/11/2012  .  Smokeless tobacco: Former Neurosurgeon  . Alcohol Use: Yes     Comment: occ    Family History  Problem Relation Age of Onset  . Cancer Mother     pancreas  . Cancer Father        Vital signs: BP 143/82  Pulse 89  Resp 18  Ht 6' (1.829 m)  Wt 220 lb 12.8 oz (100.154 kg)  BMI 29.94 kg/m2    General Appearance:    Alert, cooperative, no distress, appears stated age  Head:    Normocephalic, without obvious abnormality, atraumatic  Eyes:    PERRL, conjunctiva/corneas clear, EOM's intact, fundi    benign, both eyes       Ears:    Normal TM's and external ear canals, both ears  Nose:   Nares normal, septum midline, mucosa normal, no  drainage   or sinus tenderness  Throat:   Lips, mucosa, and tongue normal; teeth and gums normal  Neck:   Supple, symmetrical, trachea midline, no adenopathy;       thyroid:  No enlargement/tenderness/nodules; no carotid   bruit or JVD  Back:     Symmetric, no curvature, ROM normal, no CVA tenderness  Lungs:     Clear to auscultation bilaterally, respirations unlabored  Chest wall:    No tenderness or deformity  Heart:    Regular rate and rhythm, S1 and S2 normal, no murmur, rub   or gallop  Abdomen:     Soft, non-tender, bowel sounds active all four quadrants,    no masses, no organomegaly  Genitalia:    Normal male without lesion, discharge or tenderness  Rectal:    Normal tone, normal prostate, no masses or tenderness;   guaiac negative stool  Extremities:   Extremities normal, atraumatic, no cyanosis or edema  Pulses:   2+ and symmetric all extremities  Skin:   Skin color, texture, turgor normal, no rashes or lesions  Lymph nodes:   Cervical, supraclavicular, and axillary nodes normal  Neurologic:   CNII-XII intact. Normal strength, sensation and reflexes      throughout    Screening Score  MMS    PHQ2    PHQ9     Fall Risk    BIMS    Admission on 06/15/2012, Discharged on 06/24/2012  Component Date Value Range Status  . Glucose-Capillary 06/15/2012 270* 70 - 99 mg/dL Final  . Comment 1 36/64/4034 Notify RN   Final  . Glucose-Capillary 06/15/2012 264* 70 - 99 mg/dL Final  . Comment 1 74/25/9563 Notify RN   Final  . WBC 06/16/2012 9.5  4.0 - 10.5 K/uL Final   WHITE COUNT CONFIRMED ON SMEAR  . RBC 06/16/2012 4.15* 4.22 - 5.81 MIL/uL Final  . Hemoglobin 06/16/2012 12.1* 13.0 - 17.0 g/dL Final  . HCT 87/56/4332 34.9* 39.0 - 52.0 % Final  . MCV 06/16/2012 84.1  78.0 - 100.0 fL Final  . MCH 06/16/2012 29.2  26.0 - 34.0 pg Final  . MCHC 06/16/2012 34.7  30.0 - 36.0 g/dL Final  . RDW 95/18/8416 14.0  11.5 - 15.5 % Final  . Platelets 06/16/2012 179  150 - 400 K/uL Final  .  Sodium 06/16/2012 136  135 - 145 mEq/L Final  . Potassium 06/16/2012 4.3  3.5 - 5.1 mEq/L Final  . Chloride 06/16/2012 101  96 - 112 mEq/L Final  . CO2 06/16/2012 27  19 - 32 mEq/L Final  . Glucose, Bld 06/16/2012 197* 70 - 99 mg/dL Final  .  BUN 06/16/2012 13  6 - 23 mg/dL Final  . Creatinine, Ser 06/16/2012 0.88  0.50 - 1.35 mg/dL Final  . Calcium 54/09/8117 8.4  8.4 - 10.5 mg/dL Final  . GFR calc non Af Amer 06/16/2012 >90  >90 mL/min Final  . GFR calc Af Amer 06/16/2012 >90  >90 mL/min Final   Comment:                                 The eGFR has been calculated                          using the CKD EPI equation.                          This calculation has not been                          validated in all clinical                          situations.                          eGFR's persistently                          <90 mL/min signify                          possible Chronic Kidney Disease.  [name] Hemoglobin A1C 06/15/2012 10.8* <5.7 % Final   Comment: (NOTE)                                                                                                                         According to the ADA Clinical Practice Recommendations for 2011, when                          HbA1c is used as a screening test:                           >=6.5%   Diagnostic of Diabetes Mellitus                                    (if abnormal result is confirmed)                          5.7-6.4%   Increased risk of developing Diabetes Mellitus  References:Diagnosis and Classification of Diabetes Mellitus,Diabetes                          Care,2011,34(Suppl 1):S62-S69 and Standards of Medical Care in                                  Diabetes - 2011,Diabetes Care,2011,34 (Suppl 1):S11-S61.  . Mean Plasma Glucose 06/15/2012 263* <117 mg/dL Final  . WBC 16/10/9602 10.3  4.0 - 10.5 K/uL Final  . RBC 06/15/2012 4.30  4.22 - 5.81 MIL/uL Final  . Hemoglobin 06/15/2012 12.4* 13.0 - 17.0  g/dL Final  . HCT 54/09/8117 35.7* 39.0 - 52.0 % Final  . MCV 06/15/2012 83.0  78.0 - 100.0 fL Final  . MCH 06/15/2012 28.8  26.0 - 34.0 pg Final  . MCHC 06/15/2012 34.7  30.0 - 36.0 g/dL Final  . RDW 14/78/2956 13.9  11.5 - 15.5 % Final  . Platelets 06/15/2012 176  150 - 400 K/uL Final  . Creatinine, Ser 06/15/2012 1.10  0.50 - 1.35 mg/dL Final  . GFR calc non Af Amer 06/15/2012 74* >90 mL/min Final  . GFR calc Af Amer 06/15/2012 86* >90 mL/min Final   Comment:                                 The eGFR has been calculated                          using the CKD EPI equation.                          This calculation has not been                          validated in all clinical                          situations.                          eGFR's persistently                          <90 mL/min signify                          possible Chronic Kidney Disease.  . Glucose-Capillary 06/15/2012 196* 70 - 99 mg/dL Final  . Comment 1 21/30/8657 Notify RN   Final  . Comment 2 06/15/2012 Documented in Chart   Final  . Glucose-Capillary 06/15/2012 188* 70 - 99 mg/dL Final  . Comment 1 84/69/6295 Notify RN   Final  . Comment 2 06/15/2012 Documented in Chart   Final  . Glucose-Capillary 06/15/2012 251* 70 - 99 mg/dL Final  . Glucose-Capillary 06/16/2012 237* 70 - 99 mg/dL Final  . Glucose-Capillary 06/16/2012 246* 70 - 99 mg/dL Final  . Comment 1 28/41/3244 Notify RN   Final  . Glucose-Capillary 06/16/2012 216* 70 - 99 mg/dL Final  . Comment 1 01/07/7251 Documented in Chart   Final  . Comment 2 06/16/2012 Notify RN   Final  . WBC 06/17/2012 9.4  4.0 -  10.5 K/uL Final  . RBC 06/17/2012 4.09* 4.22 - 5.81 MIL/uL Final  . Hemoglobin 06/17/2012 12.0* 13.0 - 17.0 g/dL Final  . HCT 40/98/1191 34.4* 39.0 - 52.0 % Final  . MCV 06/17/2012 84.1  78.0 - 100.0 fL Final  . MCH 06/17/2012 29.3  26.0 - 34.0 pg Final  . MCHC 06/17/2012 34.9  30.0 - 36.0 g/dL Final  . RDW 47/82/9562 14.0  11.5 - 15.5 %  Final  . Platelets 06/17/2012 168  150 - 400 K/uL Final  . Sodium 06/17/2012 134* 135 - 145 mEq/L Final  . Potassium 06/17/2012 4.0  3.5 - 5.1 mEq/L Final  . Chloride 06/17/2012 100  96 - 112 mEq/L Final  . CO2 06/17/2012 26  19 - 32 mEq/L Final  . Glucose, Bld 06/17/2012 198* 70 - 99 mg/dL Final  . BUN 13/08/6576 17  6 - 23 mg/dL Final  . Creatinine, Ser 06/17/2012 1.12  0.50 - 1.35 mg/dL Final  . Calcium 46/96/2952 8.8  8.4 - 10.5 mg/dL Final  . GFR calc non Af Amer 06/17/2012 73* >90 mL/min Final  . GFR calc Af Amer 06/17/2012 84* >90 mL/min Final   Comment:                                 The eGFR has been calculated                          using the CKD EPI equation.                          This calculation has not been                          validated in all clinical                          situations.                          eGFR's persistently                          <90 mL/min signify                          possible Chronic Kidney Disease.  . Glucose-Capillary 06/16/2012 202* 70 - 99 mg/dL Final  . Comment 1 84/13/2440 Documented in Chart   Final  . Comment 2 06/16/2012 Notify RN   Final  . Glucose-Capillary 06/17/2012 204* 70 - 99 mg/dL Final  . Glucose-Capillary 06/17/2012 151* 70 - 99 mg/dL Final  . Comment 1 11/01/2534 Documented in Chart   Final  . Comment 2 06/17/2012 Notify RN   Final  . Glucose-Capillary 06/17/2012 163* 70 - 99 mg/dL Final  . Comment 1 64/40/3474 Documented in Chart   Final  . Comment 2 06/17/2012 Notify RN   Final  . Glucose-Capillary 06/17/2012 220* 70 - 99 mg/dL Final  . Comment 1 25/95/6387 Documented in Chart   Final  . Comment 2 06/17/2012 Notify RN   Final  . Glucose-Capillary 06/17/2012 173* 70 - 99 mg/dL Final  . Comment 1 56/43/3295 Documented in Chart   Final  . Comment 2 06/17/2012 Notify  RN   Final  . Glucose-Capillary 06/17/2012 226* 70 - 99 mg/dL Final  . Glucose-Capillary 06/18/2012 200* 70 - 99 mg/dL Final  .  Glucose-Capillary 06/18/2012 165* 70 - 99 mg/dL Final  . Glucose-Capillary 06/18/2012 156* 70 - 99 mg/dL Final  . Glucose-Capillary 06/18/2012 172* 70 - 99 mg/dL Final  . Glucose-Capillary 06/19/2012 163* 70 - 99 mg/dL Final  . Glucose-Capillary 06/19/2012 183* 70 - 99 mg/dL Final  . Glucose-Capillary 06/19/2012 139* 70 - 99 mg/dL Final  . Sodium 45/40/9811 136  135 - 145 mEq/L Final  . Potassium 06/20/2012 3.7  3.5 - 5.1 mEq/L Final  . Chloride 06/20/2012 99  96 - 112 mEq/L Final  . CO2 06/20/2012 27  19 - 32 mEq/L Final  . Glucose, Bld 06/20/2012 163* 70 - 99 mg/dL Final  . BUN 91/47/8295 15  6 - 23 mg/dL Final  . Creatinine, Ser 06/20/2012 0.74  0.50 - 1.35 mg/dL Final  . Calcium 62/13/0865 9.8  8.4 - 10.5 mg/dL Final  . GFR calc non Af Amer 06/20/2012 >90  >90 mL/min Final  . GFR calc Af Amer 06/20/2012 >90  >90 mL/min Final   Comment:                                 The eGFR has been calculated                          using the CKD EPI equation.                          This calculation has not been                          validated in all clinical                          situations.                          eGFR's persistently                          <90 mL/min signify                          possible Chronic Kidney Disease.  . Glucose-Capillary 06/19/2012 128* 70 - 99 mg/dL Final  . Glucose-Capillary 06/20/2012 175* 70 - 99 mg/dL Final  . Glucose-Capillary 06/20/2012 188* 70 - 99 mg/dL Final  . Glucose-Capillary 06/20/2012 182* 70 - 99 mg/dL Final  . Glucose-Capillary 06/20/2012 148* 70 - 99 mg/dL Final  . Glucose-Capillary 06/21/2012 165* 70 - 99 mg/dL Final  . Glucose-Capillary 06/21/2012 169* 70 - 99 mg/dL Final  . Glucose-Capillary 06/21/2012 137* 70 - 99 mg/dL Final  . Creatinine, Ser 06/22/2012 0.84  0.50 - 1.35 mg/dL Final  . GFR calc non Af Amer 06/22/2012 >90  >90 mL/min Final  . GFR calc Af Amer 06/22/2012 >90  >90 mL/min Final   Comment:                                  The eGFR has been calculated  using the CKD EPI equation.                          This calculation has not been                          validated in all clinical                          situations.                          eGFR's persistently                          <90 mL/min signify                          possible Chronic Kidney Disease.  . Glucose-Capillary 06/21/2012 200* 70 - 99 mg/dL Final  . Glucose-Capillary 06/22/2012 156* 70 - 99 mg/dL Final  . Glucose-Capillary 06/22/2012 164* 70 - 99 mg/dL Final  . Glucose-Capillary 06/22/2012 123* 70 - 99 mg/dL Final  . Cholesterol 16/10/9602 143  0 - 200 mg/dL Final  . Triglycerides 06/23/2012 181* <150 mg/dL Final  . HDL 54/09/8117 38* >39 mg/dL Final  . Total CHOL/HDL Ratio 06/23/2012 3.8   Final  . VLDL 06/23/2012 36  0 - 40 mg/dL Final  . LDL Cholesterol 06/23/2012 69  0 - 99 mg/dL Final   Comment:                                 Total Cholesterol/HDL:CHD Risk                          Coronary Heart Disease Risk Table                                              Men   Women                           1/2 Average Risk   3.4   3.3                           Average Risk       5.0   4.4                           2 X Average Risk   9.6   7.1                           3 X Average Risk  23.4   11.0                                                          Use the calculated Patient Ratio  above and the CHD Risk Table                          to determine the patient's CHD Risk.                                                          ATP III CLASSIFICATION (LDL):                           <100     mg/dL   Optimal                           100-129  mg/dL   Near or Above                                             Optimal                           130-159  mg/dL   Borderline                           160-189  mg/dL   High                           >190     mg/dL   Very High   . Glucose-Capillary 06/22/2012 135* 70 - 99 mg/dL Final  . Glucose-Capillary 06/23/2012 153* 70 - 99 mg/dL Final  . Glucose-Capillary 06/23/2012 121* 70 - 99 mg/dL Final  . Glucose-Capillary 06/23/2012 188* 70 - 99 mg/dL Final  . Comment 1 04/54/0981 Notify RN   Final  . Glucose-Capillary 06/23/2012 115* 70 - 99 mg/dL Final  . Glucose-Capillary 06/24/2012 131* 70 - 99 mg/dL Final  . Comment 1 19/14/7829 Documented in Chart   Final  . Comment 2 06/24/2012 Notify RN   Final  . Glucose-Capillary 06/24/2012 140* 70 - 99 mg/dL Final  . Comment 1 56/21/3086 Notify RN   Final  Hospital Outpatient Visit on 06/08/2012  Component Date Value Range Status  . MRSA, PCR 06/08/2012 NEGATIVE  NEGATIVE Final  . Staphylococcus aureus 06/08/2012 NEGATIVE  NEGATIVE Final   Comment:                                 The Xpert SA Assay (FDA                          approved for NASAL specimens                          in patients over 9 years of age),                          is one component of  a comprehensive surveillance                          program.  Test performance has                          been validated by [facility] for patients greater                          than or equal to 57 year old.                          It is not intended                          to diagnose infection nor to                          guide or monitor treatment.  [name] aPTT 06/08/2012 29  24 - 37 seconds Final  . WBC 06/08/2012 8.8  4.0 - 10.5 K/uL Final  . RBC 06/08/2012 4.82  4.22 - 5.81 MIL/uL Final  . Hemoglobin 06/08/2012 13.9  13.0 - 17.0 g/dL Final  . HCT 81/19/1478 39.0  39.0 - 52.0 % Final  . MCV 06/08/2012 80.9  78.0 - 100.0 fL Final  . MCH 06/08/2012 28.8  26.0 - 34.0 pg Final  . MCHC 06/08/2012 35.6  30.0 - 36.0 g/dL Final  . RDW 29/56/2130 13.3  11.5 - 15.5 % Final  . Platelets 06/08/2012 221  150 - 400 K/uL Final  . Sodium 06/08/2012 134* 135 -  145 mEq/L Final  . Potassium 06/08/2012 4.3  3.5 - 5.1 mEq/L Final   HEMOLYSIS AT THIS LEVEL MAY AFFECT RESULT  . Chloride 06/08/2012 101  96 - 112 mEq/L Final  . CO2 06/08/2012 21  19 - 32 mEq/L Final  . Glucose, Bld 06/08/2012 223* 70 - 99 mg/dL Final  . BUN 86/57/8469 26* 6 - 23 mg/dL Final  . Creatinine, Ser 06/08/2012 1.11  0.50 - 1.35 mg/dL Final  . Calcium 62/95/2841 9.7  8.4 - 10.5 mg/dL Final  . Total Protein 06/08/2012 7.3  6.0 - 8.3 g/dL Final  . Albumin 32/44/0102 4.1  3.5 - 5.2 g/dL Final  . AST 72/53/6644 27  0 - 37 U/L Final  . ALT 06/08/2012 28  0 - 53 U/L Final  . Alkaline Phosphatase 06/08/2012 90  39 - 117 U/L Final  . Total Bilirubin 06/08/2012 0.2* 0.3 - 1.2 mg/dL Final  . GFR calc non Af Amer 06/08/2012 74* >90 mL/min Final  . GFR calc Af Amer 06/08/2012 85* >90 mL/min Final   Comment:                                 The eGFR has been calculated                          using the CKD EPI equation.  This calculation has not been                          validated in all clinical                          situations.                          eGFR's persistently                          <90 mL/min signify                          possible Chronic Kidney Disease.  [name] Prothrombin Time 06/08/2012 12.5  11.6 - 15.2 seconds Final  . INR 06/08/2012 0.94  0.00 - 1.49 Final  . ABO/RH(D) 06/08/2012 A POS   Final  . Antibody Screen 06/08/2012 NEG   Final  . Sample Expiration 06/08/2012 06/22/2012   Final  . Color, Urine 06/08/2012 YELLOW  YELLOW Final  . APPearance 06/08/2012 CLEAR  CLEAR Final  . Specific Gravity, Urine 06/08/2012 1.026  1.005 - 1.030 Final  . pH 06/08/2012 5.0  5.0 - 8.0 Final  . Glucose, UA 06/08/2012 500* NEGATIVE mg/dL Final  . Hgb urine dipstick 06/08/2012 NEGATIVE  NEGATIVE Final  . Bilirubin Urine 06/08/2012 NEGATIVE  NEGATIVE Final  . Ketones, ur 06/08/2012 NEGATIVE  NEGATIVE mg/dL Final  . Protein, ur 16/10/9602 NEGATIVE   NEGATIVE mg/dL Final  . Urobilinogen, UA 06/08/2012 0.2  0.0 - 1.0 mg/dL Final  . Nitrite 54/09/8117 NEGATIVE  NEGATIVE Final  . Leukocytes, UA 06/08/2012 NEGATIVE  NEGATIVE Final   MICROSCOPIC NOT DONE ON URINES WITH NEGATIVE PROTEIN, BLOOD, LEUKOCYTES, NITRITE, OR GLUCOSE <1000 mg/dL.  . ABO/RH(D) 06/08/2012 A POS   Final     Annual summary: Hospitalizations:   Infection History:  Functional assessment: Areas of potential improvement: Rehabilitation Potential: Prognosis for survival: Plan: This encounter was created in error - please disregard.

## 2012-07-05 ENCOUNTER — Other Ambulatory Visit: Payer: Self-pay | Admitting: *Deleted

## 2012-07-05 MED ORDER — OXYCODONE HCL 5 MG PO TABS: ORAL_TABLET | ORAL | Status: DC

## 2012-07-14 ENCOUNTER — Encounter: Payer: Self-pay | Admitting: Vascular Surgery

## 2012-07-15 ENCOUNTER — Encounter: Payer: Self-pay | Admitting: Vascular Surgery

## 2012-07-15 ENCOUNTER — Ambulatory Visit (INDEPENDENT_AMBULATORY_CARE_PROVIDER_SITE_OTHER): Payer: No Typology Code available for payment source | Admitting: Vascular Surgery

## 2012-07-15 VITALS — BP 131/86 | HR 79 | Resp 18 | Ht 72.0 in | Wt 220.0 lb

## 2012-07-15 DIAGNOSIS — Z48812 Encounter for surgical aftercare following surgery on the circulatory system: Secondary | ICD-10-CM

## 2012-07-15 DIAGNOSIS — I998 Other disorder of circulatory system: Secondary | ICD-10-CM

## 2012-07-15 DIAGNOSIS — I739 Peripheral vascular disease, unspecified: Secondary | ICD-10-CM

## 2012-07-15 DIAGNOSIS — I999 Unspecified disorder of circulatory system: Secondary | ICD-10-CM

## 2012-07-15 NOTE — Progress Notes (Signed)
[facility] [place]  Postoperative Visit  History of Present Illness  Javier Quinn is a 52 y.o. year old male who presents for postoperative follow-up for:  PROCEDURE:  1. Right superficial femoral artery to peroneal artery bypass with ipsilateral non-reverse greater saphenous vein  2. Open superficial femoral artery cannulation  3. Right leg runoff  (Date: 06/15/12).  The patient's wounds are healing.  The patient notes improvement in lower extremity symptoms.  The patient is able to complete their activities of daily living with some sx.  The patient's current symptoms are: some numbness in lower leg and radicular sx radiating down his posterior leg and swelling lower leg.  For VQI Use Only  PRE-ADM LIVING: Home  AMB STATUS: Ambulatory with Assistance  Physical Examination  Filed Vitals:   07/15/12 1619  BP: 131/86  Pulse: 79  Resp: 18   RLE: Incisions are healing, no wound present, pedal pulses are non-palpable, L foot is edematous 2+  Medical Decision Making  Javier Quinn is a 52 y.o. year old male who presents s/p R SFA to peroneal bypass with ips NR GSV.  The patient's bypass incisions are healing appropriately with improvement of pre-operative symptoms.  He now has some radicular sx that are troubling him.  He will follow up in 2-3 weeks and we will repeat the ABI in R leg. I discussed in depth with the patient the nature of atherosclerosis, and emphasized the importance of maximal medical management including strict control of blood pressure, blood glucose, and lipid levels, obtaining regular exercise, and cessation of smoking.  The patient is aware that without maximal medical management the underlying atherosclerotic disease process will progress, limiting the benefit of any interventions. The patient is aware of the severe tibial artery disease he had on exploration of the right leg.  He is not a candidate for any further surgical  revascularization in the R leg. We have discussed possibility an OA/PTA of peroneal artery in the future.  Thank you for allowing [place] to participate in this patient's care.  Leonides Sake, MD [facility] [place] Office: [phone] Pager: [phone]  07/15/2012, 5:17 PM

## 2012-07-18 NOTE — Addendum Note (Signed)
Addended by: Adria Dill L on: 07/18/2012 09:54 AM   Modules accepted: Orders

## 2012-07-19 ENCOUNTER — Other Ambulatory Visit: Payer: Self-pay | Admitting: Geriatric Medicine

## 2012-07-19 MED ORDER — OXYCODONE HCL 5 MG PO TABS: ORAL_TABLET | ORAL | Status: DC

## 2012-07-22 ENCOUNTER — Non-Acute Institutional Stay (SKILLED_NURSING_FACILITY): Payer: Medicaid Other | Admitting: Nurse Practitioner

## 2012-07-22 ENCOUNTER — Encounter: Payer: Self-pay | Admitting: Nurse Practitioner

## 2012-07-22 DIAGNOSIS — I10 Essential (primary) hypertension: Secondary | ICD-10-CM

## 2012-07-22 DIAGNOSIS — E119 Type 2 diabetes mellitus without complications: Secondary | ICD-10-CM

## 2012-07-22 DIAGNOSIS — E1169 Type 2 diabetes mellitus with other specified complication: Secondary | ICD-10-CM

## 2012-07-22 DIAGNOSIS — E785 Hyperlipidemia, unspecified: Secondary | ICD-10-CM

## 2012-07-22 DIAGNOSIS — I739 Peripheral vascular disease, unspecified: Secondary | ICD-10-CM

## 2012-07-22 DIAGNOSIS — I70219 Atherosclerosis of native arteries of extremities with intermittent claudication, unspecified extremity: Secondary | ICD-10-CM

## 2012-07-22 NOTE — Progress Notes (Signed)
Patient ID: Javier Quinn, male   DOB: 1960/02/07, 52 y.o.   MRN: [id number]  Nursing Home Location:  [facility]   Place of Service: SNF (31)  Chief Complaint  Patient presents with  . 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 Leonides Sake did right superficial femoral artery to peroneal artery bypass with ipsilateral non-reverse greater saphenous vein Open superficial femoral artery cannulation . Right leg runoff He presents with surgical pain limiting strength and ROM in right LE and decreased tolerance to weight bearing. On 06/24/2012 patient was discharged to [facility] for rehab. Overall doing well with rehab and is now ready to be discharge home.    Review of Systems:  Review of Systems  Constitutional: Negative for fever and chills.  Respiratory: Negative for cough, sputum production, shortness of breath and wheezing.   Cardiovascular: Negative for chest pain, palpitations, orthopnea and leg swelling.  Gastrointestinal: Negative for abdominal pain, diarrhea and constipation.  Genitourinary: Negative for dysuria, urgency and frequency.  Musculoskeletal: Positive for joint pain (pain in leg and ankle). Negative for myalgias and falls.  Skin: Negative.   Neurological: Positive for tingling (numbness and tingling in LE). Negative for dizziness and headaches.  Psychiatric/Behavioral: Negative for depression and memory loss. The patient does not have insomnia.      Medications: Patient's Medications  New Prescriptions   No medications on file  Previous Medications   ACETAMINOPHEN (TYLENOL) 325 MG TABLET    Take 650 mg by mouth every 6 (six) hours as needed for pain.   CLOPIDOGREL (PLAVIX) 75 MG TABLET    Take 1 tablet (75 mg total) by mouth daily with breakfast.   INSULIN DETEMIR (LEVEMIR) 100 UNIT/ML INJECTION    Inject 0.1 mLs (10 Units  total) into the skin daily.   LISINOPRIL-HYDROCHLOROTHIAZIDE (PRINZIDE,ZESTORETIC) 20-12.5 MG PER TABLET    Take 1 tablet by mouth daily.   METFORMIN (GLUCOPHAGE-XR) 500 MG 24 HR TABLET    Take 500 mg by mouth 2 (two) times daily.   OXYCODONE (ROXICODONE) 5 MG IMMEDIATE RELEASE TABLET    Take 1 tablet every 4 hours as needed for pain   PRAVASTATIN (PRAVACHOL) 80 MG TABLET    Take 80 mg by mouth daily.  Modified Medications   No medications on file  Discontinued Medications   No medications on file     Physical Exam:  Filed Vitals:   07/22/12 1516  BP: 137/77  Pulse: 70  Temp: 96.6 F (35.9 C)  Resp: 20    Physical Exam  Constitutional: He is oriented to person, place, and time and well-developed, well-nourished, and in no distress. No distress.  HENT:  Head: Normocephalic and atraumatic.  Eyes: Conjunctivae and EOM are normal. Pupils are equal, round, and reactive to light.  Neck: Normal range of motion. Neck supple.  Cardiovascular: Normal rate and regular rhythm.   Pulmonary/Chest: Effort normal and breath sounds normal.  Abdominal: Soft. Bowel sounds are normal. He exhibits no distension. There is no tenderness.  Musculoskeletal: Normal range of motion. He exhibits no edema and no tenderness.  Neurological: He is alert and oriented to person, place, and time.  Skin: Skin is warm and dry. He is not diaphoretic.     Labs reviewed/Significant Diagnostic Results: CBC with Diff       Result: 07/13/2012 3:00 PM    (  Status: F )       C     WBC  7.4        4.0-10.5  K/uL  SLN       RBC  4.16     L  4.22-5.81  MIL/uL  SLN       Hemoglobin  11.7     L  13.0-17.0  g/dL  SLN       Hematocrit  33.9     L  39.0-52.0  %  SLN       MCV  81.5        78.0-100.0  fL  SLN       MCH  28.1        26.0-34.0  pg  SLN       MCHC  34.5        30.0-36.0  g/dL  SLN       RDW  16.1        11.5-15.5  %  SLN       Platelet Count  284        150-400  K/uL  SLN       Granulocyte %  44        43-77   %  SLN       Absolute Gran  3.2        1.7-7.7  K/uL  SLN       Lymph %  45        12-46  %  SLN       Absolute Lymph  3.3        0.7-4.0  K/uL  SLN       Mono %  8        3-12  %  SLN       Absolute Mono  0.6        0.1-1.0  K/uL  SLN       Eos %  3        0-5  %  SLN       Absolute Eos  0.2        0.0-0.7  K/uL  SLN       Baso %  0        0-1  %  SLN       Absolute Baso  0.0        0.0-0.1  K/uL  SLN       Smear Review  Criteria for review not met   SLN      Basic Metabolic Panel       Result: 07/13/2012 2:43 PM    ( Status: F )            Sodium  139        135-145  mEq/L  SLN       Potassium  4.1        3.5-5.3  mEq/L  SLN       Chloride  103        96-112  mEq/L  SLN       CO2  25        19-32  mEq/L  SLN       Glucose  162     H  70-99  mg/dL  SLN       BUN  14        6-23  mg/dL  SLN       Creatinine  0.88  0.50-1.35  mg/dL  SLN       Calcium  9.4        8.4-10.5  mg/dL  SLN      Lipid Profile       Result: 07/13/2012 2:43 PM    ( Status: F )            Cholesterol  166        0-200  mg/dL  SLN  C     Triglyceride  275     H  <150  mg/dL  SLN       HDL Cholesterol  36     L  >39  mg/dL  SLN       Total Chol/HDL Ratio  4.6         Ratio  SLN       VLDL Cholesterol (Calc)  55     H  0-40  mg/dL  SLN       LDL Cholesterol (Calc)  75        0-99  mg/dL  SLN  C    Hepatitis Acute Panel       Result: 07/14/2012 3:55 PM    ( Status: F )            Hepatitis B Surface Antigen  NEGATIVE        NEGATIVE   SLN       Hepatitis B Core Ab, IgM  NEG        NEGATIVE   SLN  C     Hepatitis A Antibody, IgM  NEG        NEGATIVE   SLN       Hepatitis C Antibody  NEGATIVE        NEGATIVE   SLN      Hemoglobin A1C       Result: 07/13/2012 4:32 PM    ( Status: F )            Hemoglobin A1C  9.2     H  <5.7  %  SLN  C     Estimated Average Glucose  217     H  <117  mg/dL  SLN       Assessment/Plan 1. Peripheral vascular disease-- S/p R SFA to peroneal bypass- healing well- cont follow up  with vein and vascular to cont plavix  2. Uncontrolled diabetes mellitus-- to cont levemir and metformin on discharge; education provided on diabetes and for nursing to provide insulin administration educaton; will need follow up with PCP on blood sugars and A1C 3. Unspecified essential hypertension-- stable cont lisinopril and hctz.  4. Dyslipidemia associated with type 2 diabetes mellitus-- worsen by uncontrolled diabetes- lifestyle modifications and to cont pravastatin  pt is stable for discharge-will need PT/OT/Nursing per home health. No DME needed. Rx written.  will need to follow up with PCP next week.

## 2012-08-04 ENCOUNTER — Encounter: Payer: Self-pay | Admitting: Vascular Surgery

## 2012-08-04 ENCOUNTER — Emergency Department (HOSPITAL_COMMUNITY)
Admission: EM | Admit: 2012-08-04 | Discharge: 2012-08-04 | Disposition: A | Discharge status: Home / Self Care | Payer: No Typology Code available for payment source | Attending: Emergency Medicine | Admitting: Emergency Medicine

## 2012-08-04 ENCOUNTER — Emergency Department (HOSPITAL_COMMUNITY): Payer: No Typology Code available for payment source

## 2012-08-04 ENCOUNTER — Encounter (HOSPITAL_COMMUNITY): Payer: Self-pay | Admitting: *Deleted

## 2012-08-04 DIAGNOSIS — R071 Chest pain on breathing: Secondary | ICD-10-CM | POA: Insufficient documentation

## 2012-08-04 DIAGNOSIS — E785 Hyperlipidemia, unspecified: Secondary | ICD-10-CM | POA: Insufficient documentation

## 2012-08-04 DIAGNOSIS — Z79899 Other long term (current) drug therapy: Secondary | ICD-10-CM | POA: Insufficient documentation

## 2012-08-04 DIAGNOSIS — M7989 Other specified soft tissue disorders: Secondary | ICD-10-CM | POA: Insufficient documentation

## 2012-08-04 DIAGNOSIS — R0602 Shortness of breath: Secondary | ICD-10-CM

## 2012-08-04 DIAGNOSIS — R609 Edema, unspecified: Secondary | ICD-10-CM | POA: Insufficient documentation

## 2012-08-04 DIAGNOSIS — E119 Type 2 diabetes mellitus without complications: Secondary | ICD-10-CM | POA: Insufficient documentation

## 2012-08-04 DIAGNOSIS — I10 Essential (primary) hypertension: Secondary | ICD-10-CM | POA: Insufficient documentation

## 2012-08-04 DIAGNOSIS — R0789 Other chest pain: Secondary | ICD-10-CM | POA: Insufficient documentation

## 2012-08-04 DIAGNOSIS — J40 Bronchitis, not specified as acute or chronic: Secondary | ICD-10-CM | POA: Insufficient documentation

## 2012-08-04 DIAGNOSIS — Z794 Long term (current) use of insulin: Secondary | ICD-10-CM | POA: Insufficient documentation

## 2012-08-04 DIAGNOSIS — Z8719 Personal history of other diseases of the digestive system: Secondary | ICD-10-CM | POA: Insufficient documentation

## 2012-08-04 DIAGNOSIS — Z87891 Personal history of nicotine dependence: Secondary | ICD-10-CM | POA: Insufficient documentation

## 2012-08-04 DIAGNOSIS — R197 Diarrhea, unspecified: Secondary | ICD-10-CM | POA: Insufficient documentation

## 2012-08-04 LAB — CBC WITH DIFFERENTIAL/PLATELET
Eosinophils Relative: 4 % (ref 0–5)
HCT: 38.6 % — ABNORMAL LOW (ref 39.0–52.0)
Hemoglobin: 13.7 g/dL (ref 13.0–17.0)
Lymphocytes Relative: 39 % (ref 12–46)
MCHC: 35.5 g/dL (ref 30.0–36.0)
MCV: 84.1 fL (ref 78.0–100.0)
Monocytes Absolute: 0.5 10*3/uL (ref 0.1–1.0)
Monocytes Relative: 7 % (ref 3–12)
Neutro Abs: 3.5 10*3/uL (ref 1.7–7.7)
WBC: 7.1 10*3/uL (ref 4.0–10.5)

## 2012-08-04 LAB — COMPREHENSIVE METABOLIC PANEL
BUN: 17 mg/dL (ref 6–23)
CO2: 23 mEq/L (ref 19–32)
Calcium: 10.3 mg/dL (ref 8.4–10.5)
Chloride: 100 mEq/L (ref 96–112)
Creatinine, Ser: 0.79 mg/dL (ref 0.50–1.35)
GFR calc Af Amer: >90 mL/min (ref >90)
GFR calc non Af Amer: >90 mL/min (ref >90)
Total Bilirubin: 0.4 mg/dL (ref 0.3–1.2)

## 2012-08-04 LAB — TROPONIN I: Troponin I: <0.3 ng/mL (ref <0.30)

## 2012-08-04 LAB — LIPASE, BLOOD: Lipase: 38 U/L (ref 11–59)

## 2012-08-04 MED ORDER — ALBUTEROL SULFATE HFA 108 (90 BASE) MCG/ACT IN AERS: 2.0000 | INHALATION_SPRAY | RESPIRATORY_TRACT | Status: DC | PRN

## 2012-08-04 MED ORDER — DOXYCYCLINE HYCLATE 100 MG PO CAPS: 100.0000 mg | ORAL_CAPSULE | Freq: Two times a day (BID) | ORAL | Status: DC

## 2012-08-04 NOTE — ED Notes (Signed)
Pt has returned from being out of the department; pt placed back on monitor, continuous pulse oximetry and blood pressure cuff; family at bedside 

## 2012-08-04 NOTE — ED Notes (Signed)
Patient states increasingly sob over past few days, patient states dry/hacking cough with chest pain associated when coughing, patient also states diarrhea x 1 week

## 2012-08-04 NOTE — ED Notes (Signed)
Pt resting at this time watching tv

## 2012-08-04 NOTE — ED Notes (Signed)
Pt ambulated from room to nurses' station and back stating 97%-98% with a heartrate of 97-101

## 2012-08-04 NOTE — Progress Notes (Signed)
VASCULAR LAB PRELIMINARY  PRELIMINARY  PRELIMINARY  PRELIMINARY  Right lower extremity venous duplex completed.    Preliminary report:  Right:  No evidence of DVT, superficial thrombosis, or Baker's cyst.  Athira Janowicz, RVT 08/04/2012, 1:55 PM

## 2012-08-04 NOTE — ED Provider Notes (Signed)
CSN: [id number]     Arrival date & time 08/04/12  4401 History     First MD Initiated Contact with Patient 08/04/12 0945     Chief Complaint  Patient presents with  . Cough  . Diarrhea  . Chest Pain   (Consider location/radiation/quality/duration/timing/severity/associated sxs/prior Treatment) HPI Comments: Presents with a history of dry nonproductive cough an associated left-sided chest pain with coughing. Denies any fevers. Denies any abdominal pain or vomiting. He endorses loose stools over the past week having once daily sometimes twice. Denies any blood in stools. Denies any appetite change, dysuria hematuria. He had a bypass graft of his right leg June 11. He is due to see Dr. Imogene Burn again tomorrow.  The history is provided by the patient.    Past Medical History  Diagnosis Date  . Hypertension   . Diabetes mellitus without complication   . Hyperlipidemia   . Tobacco dependence   . GERD (gastroesophageal reflux disease)   . H/O hiatal hernia    Past Surgical History  Procedure Laterality Date  . Bypass graft femoral-peroneal Right 06/15/2012    Procedure: BYPASS GRAFT [place];  Surgeon: Fransisco Hertz, MD;  Location: [facility] OR;  Service: Vascular;  Laterality: Right;  using right non-reversed saphenous vein  . Lower extremity angiogram Right 06/15/2012    Procedure: LOWER EXTREMITY ANGIOGRAM;  Surgeon: Fransisco Hertz, MD;  Location: [facility] OR;  Service: Vascular;  Laterality: Right;   Family History  Problem Relation Age of Onset  . Cancer Mother     pancreas  . Cancer Father    History  Substance Use Topics  . Smoking status: Former Smoker -- 0.50 packs/day for 20 years    Types: Cigarettes    Quit date: 04/11/2012  . Smokeless tobacco: Former Neurosurgeon  . Alcohol Use: No     Comment: occ    Review of Systems  Constitutional: Negative for fever, activity change and appetite change.  HENT: Negative for congestion and rhinorrhea.   Eyes: Negative for visual disturbance.   Respiratory: Positive for cough, chest tightness and shortness of breath.   Cardiovascular: Positive for chest pain and leg swelling.  Gastrointestinal: Positive for diarrhea. Negative for nausea, vomiting and abdominal pain.  Genitourinary: Negative for dysuria, urgency and hematuria.  Musculoskeletal: Negative for back pain.  Skin: Negative for rash.  Neurological: Negative for dizziness, weakness and headaches.  A complete 10 system review of systems was obtained and all systems are negative except as noted in the HPI and PMH.    Allergies  Review of patient's allergies indicates no known allergies.  Home Medications   Current Outpatient Rx  Name  Route  Sig  Dispense  Refill  . acetaminophen (TYLENOL) 325 MG tablet   Oral   Take 650 mg by mouth every 6 (six) hours as needed for pain.         [name] clopidogrel (PLAVIX) 75 MG tablet   Oral   Take 1 tablet (75 mg total) by mouth daily with breakfast.   30 tablet   6   . insulin detemir (LEVEMIR) 100 UNIT/ML injection   Subcutaneous   Inject 20 Units into the skin every morning.         [name] lisinopril-hydrochlorothiazide (PRINZIDE,ZESTORETIC) 20-12.5 MG per tablet   Oral   Take 1 tablet by mouth daily.         . metFORMIN (GLUCOPHAGE) 500 MG tablet   Oral   Take 1,000 mg by mouth 2 (two) times  daily with a meal.         . pravastatin (PRAVACHOL) 80 MG tablet   Oral   Take 80 mg by mouth daily.         [name] albuterol (PROVENTIL HFA;VENTOLIN HFA) 108 (90 BASE) MCG/ACT inhaler   Inhalation   Inhale 2 puffs into the lungs every 4 (four) hours as needed for wheezing.   1 each   0   . doxycycline (VIBRAMYCIN) 100 MG capsule   Oral   Take 1 capsule (100 mg total) by mouth 2 (two) times daily.   20 capsule   0    BP 135/86  Pulse 91  Temp(Src) 97.6 F (36.4 C) (Oral)  Resp 20  Ht 6' (1.829 m)  Wt 220 lb (99.791 kg)  BMI 29.83 kg/m2  SpO2 100% Physical Exam  Constitutional: He is oriented to person, place,  and time. He appears well-developed. No distress.  HENT:  Head: Normocephalic and atraumatic.  Mouth/Throat: Oropharynx is clear and moist. No oropharyngeal exudate.  Eyes: Conjunctivae and EOM are normal. Pupils are equal, round, and reactive to light.  Neck: Normal range of motion. Neck supple.  Cardiovascular: Normal rate, regular rhythm and normal heart sounds.   No murmur heard. Pulmonary/Chest: Effort normal and breath sounds normal. No respiratory distress. He has no wheezes.  Abdominal: Soft. There is no tenderness. There is no rebound and no guarding.  Musculoskeletal: Normal range of motion. He exhibits edema.  +1 edema to R foot and ankle.  Unable to palpate DP pulses, similar to Dr. Nicky Pugh last note.  Neurological: He is alert and oriented to person, place, and time. No cranial nerve deficit. He exhibits normal muscle tone. Coordination normal.  Skin: Skin is warm.    ED Course   Procedures (including critical care time)  Labs Reviewed  CBC WITH DIFFERENTIAL - Abnormal; Notable for the following:    HCT 38.6 (*)    All other components within normal limits  COMPREHENSIVE METABOLIC PANEL - Abnormal; Notable for the following:    Glucose, Bld 258 (*)    All other components within normal limits  LIPASE, BLOOD  TROPONIN I  PRO B NATRIURETIC PEPTIDE  D-DIMER, QUANTITATIVE  URINALYSIS, ROUTINE W REFLEX MICROSCOPIC   Dg Chest 2 View  08/04/2012   *RADIOLOGY REPORT*  Clinical Data: Cough, diarrhea.  CHEST - 2 VIEW  Comparison: 06/08/2012  Findings: Heart and mediastinal contours are within normal limits. No focal opacities or effusions.  No acute bony abnormality.  IMPRESSION: No active cardiopulmonary disease.   Original Report Authenticated By: Charlett Nose, M.D.   1. Bronchitis     MDM  2 day history of dry nonproductive cough with chest pain with coughing. Intermittent loose stools over the past week. EKG nonischemic unchanged.  CXR negative. No  infiltrate. Troponin negative, d-dimer negative. Doppler of RLE negative for DVT. Ambulatory in the ED without desaturation. Will treat for bronchitis given smoking history.   Date: 08/04/2012  Rate: 91  Rhythm: normal sinus rhythm  QRS Axis: normal  Intervals: normal  ST/T Wave abnormalities: normal  Conduction Disutrbances:none  Narrative Interpretation:   Old EKG Reviewed: unchanged      Glynn Octave, MD 08/04/12 1701

## 2012-08-04 NOTE — ED Notes (Signed)
Pt undressed, in gown, on continuous pulse oximetry and blood pressure cuff; family at bedside 

## 2012-08-05 ENCOUNTER — Ambulatory Visit (INDEPENDENT_AMBULATORY_CARE_PROVIDER_SITE_OTHER): Payer: No Typology Code available for payment source | Admitting: Vascular Surgery

## 2012-08-05 ENCOUNTER — Encounter (INDEPENDENT_AMBULATORY_CARE_PROVIDER_SITE_OTHER): Payer: No Typology Code available for payment source | Admitting: *Deleted

## 2012-08-05 ENCOUNTER — Encounter: Payer: Self-pay | Admitting: Vascular Surgery

## 2012-08-05 VITALS — BP 150/105 | HR 64 | Temp 97.5°F | Resp 16 | Ht 72.0 in | Wt 222.0 lb

## 2012-08-05 DIAGNOSIS — I998 Other disorder of circulatory system: Secondary | ICD-10-CM

## 2012-08-05 DIAGNOSIS — I999 Unspecified disorder of circulatory system: Secondary | ICD-10-CM

## 2012-08-05 DIAGNOSIS — I739 Peripheral vascular disease, unspecified: Secondary | ICD-10-CM

## 2012-08-05 DIAGNOSIS — Z48812 Encounter for surgical aftercare following surgery on the circulatory system: Secondary | ICD-10-CM | POA: Insufficient documentation

## 2012-08-05 DIAGNOSIS — M79609 Pain in unspecified limb: Secondary | ICD-10-CM | POA: Insufficient documentation

## 2012-08-05 NOTE — Progress Notes (Signed)
[facility] [place]  Established Critical Limb Ischemia Patient  History of Present Illness  Javier Quinn is a 52 y.o. (11-18-1960) male s/p Right superficial femoral artery to peroneal artery bypass with ipsilateral non-reverse greater saphenous vein (Date: 06/15/12) who presents with chief complaint: R leg pain and swelling.  The patient has no rest pain and wounds include: none.  The patient notes symptoms have improved 100% per pt.  The pt has some anterior anesthesia and swelling in both leg with extended standing.  The patient's treatment regimen currently included: maximal medical management.  The patient notes successful tobacco cessation.  The patient's PMH, PSH, SH, FamHx, Med, and Allergies are unchanged from 07/15/12.  On ROS today: no rest pain, no gangrene or ulcers  Physical Examination  Filed Vitals:   08/05/12 1024  BP: 150/105  Pulse: 64  Temp: 97.5 F (36.4 C)  TempSrc: Oral  Resp: 16  Height: 6' (1.829 m)  Weight: 222 lb (100.699 kg)  SpO2: 98%   Body mass index is 30.1 kg/(m^2).  General: A&O x 3, WDWN  Eyes: PERRLA, EOMI  Pulmonary: Sym exp, good air movt, CTAB, no rales, rhonchi, & wheezing  Cardiac: RRR, Nl S1, S2, no Murmurs, rubs or gallops  Vascular: Vessel Right Left  Radial Palpable Palpable  Brachial Palpable Palpable  Carotid Palpable, without bruit Palpable, without bruit  Aorta Not palpable N/A  Femoral Palpable Palpable  Popliteal Not palpable Not palpable  PT Not Palpable Not Palpable  DP Not Palpable Not Palpable   Gastrointestinal: soft, NTND, -G/R, - HSM, - masses, - CVAT B  Musculoskeletal: M/S 5/5 throughout , Extremities without ischemic changes   Neurologic: Pain and light touch intact in extremities , Motor exam as listed above  Non-Invasive Vascular Imaging ABI (Date: 08/05/2012)  R: 0.79 (0.50), DP: mono, PT: mono, peroneal: mono, TBI: 0  L: 0.57 (0.62), DP: mono, PT: mono, TBI: 0.35  Medical  Decision Making  Javier Quinn is a 52 y.o. male who presents with: BLE critical limb ischemia with no wounds.   Based on the patient's vascular studies and examination, I have offered the patient: continued surveillance in 3 months (BLE ABI, RLE arterial duplex).  I discussed in depth with the patient the nature of atherosclerosis, and emphasized the importance of maximal medical management including strict control of blood pressure, blood glucose, and lipid levels, antiplatelet agents, obtaining regular exercise, and cessation of smoking.    The patient is aware that without maximal medical management the underlying atherosclerotic disease process will progress, limiting the benefit of any interventions. The patient is currently on a statin: Pravachol.    The patient is currently on an anti-platelet: Plavix.    Thank you for allowing [place] to participate in this patient's care.  Leonides Sake, MD [facility] [place] Office: [phone] Pager: [phone]  08/05/2012, 11:01 AM

## 2012-08-10 ENCOUNTER — Ambulatory Visit: Payer: Medicaid Other | Attending: Family Medicine | Admitting: Family Medicine

## 2012-08-10 VITALS — BP 128/87 | HR 98 | Temp 98.0°F | Resp 16 | Ht 71.0 in | Wt 223.0 lb

## 2012-08-10 DIAGNOSIS — I739 Peripheral vascular disease, unspecified: Secondary | ICD-10-CM

## 2012-08-10 DIAGNOSIS — E785 Hyperlipidemia, unspecified: Secondary | ICD-10-CM

## 2012-08-10 DIAGNOSIS — Z1211 Encounter for screening for malignant neoplasm of colon: Secondary | ICD-10-CM

## 2012-08-10 DIAGNOSIS — E119 Type 2 diabetes mellitus without complications: Secondary | ICD-10-CM | POA: Insufficient documentation

## 2012-08-10 DIAGNOSIS — I10 Essential (primary) hypertension: Secondary | ICD-10-CM | POA: Insufficient documentation

## 2012-08-10 HISTORY — DX: Hyperlipidemia, unspecified: E78.5

## 2012-08-10 MED ORDER — METFORMIN HCL ER 500 MG PO TB24: 1000.0000 mg | ORAL_TABLET | Freq: Every day | ORAL | Status: DC

## 2012-08-10 MED ORDER — PRAVASTATIN SODIUM 80 MG PO TABS: 80.0000 mg | ORAL_TABLET | Freq: Every day | ORAL | Status: DC

## 2012-08-10 MED ORDER — INSULIN DETEMIR 100 UNIT/ML ~~LOC~~ SOLN: 20.0000 [IU] | Freq: Every morning | SUBCUTANEOUS | Status: DC

## 2012-08-10 MED ORDER — LISINOPRIL-HYDROCHLOROTHIAZIDE 20-12.5 MG PO TABS: 1.0000 | ORAL_TABLET | Freq: Every day | ORAL | Status: DC

## 2012-08-10 NOTE — Progress Notes (Signed)
Patient presents for hospital follow up from 1 week ago for bronchitis; also follow up for Diabetes and hypertension.

## 2012-08-10 NOTE — Patient Instructions (Addendum)
Peripheral Vascular Disease  Peripheral vascular disease (PVD) is caused by cholesterol buildup in the arteries. The arteries become narrow or clogged. This makes it hard for blood to flow. It happens most in the legs, but it can occur in other areas of your body. HOME CARE   Quit smoking, if you smoke.  Exercise as told by your doctor.  Follow a low-fat, low-cholesterol diet as told by your doctor.  Control your diabetes, if you have diabetes.  Care for your feet to prevent infection.  Only take medicine as told by your doctor. GET HELP RIGHT AWAY IF:   You have pain or lose feeling (numbness) in your arms or legs.  Your arms or legs turn cold or blue.  You have redness, warmth, and puffiness (swelling) in your arms or legs. MAKE SURE YOU:   Understand these instructions.  Will watch your condition.  Will get help right away if you are not doing well or get worse. Document Released: 03/18/2009 Document Revised: 03/16/2011 Document Reviewed: 03/18/2009 [facility] Patient Information 2014 [place], [place]. Hypertension As your heart beats, it forces blood through your arteries. This force is your blood pressure. If the pressure is too high, it is called hypertension (HTN) or high blood pressure. HTN is dangerous because you may have it and not know it. High blood pressure may mean that your heart has to work harder to pump blood. Your arteries may be narrow or stiff. The extra work puts you at risk for heart disease, stroke, and other problems.  Blood pressure consists of two numbers, a higher number over a lower, 110/72, for example. It is stated as "110 over 72." The ideal is below 120 for the top number (systolic) and under 80 for the bottom (diastolic). Write down your blood pressure today. You should pay close attention to your blood pressure if you have certain conditions such as:  Heart failure.  Prior heart attack.  Diabetes  Chronic kidney disease.  Prior  stroke.  Multiple risk factors for heart disease. To see if you have HTN, your blood pressure should be measured while you are seated with your arm held at the level of the heart. It should be measured at least twice. A one-time elevated blood pressure reading (especially in the Emergency Department) does not mean that you need treatment. There may be conditions in which the blood pressure is different between your right and left arms. It is important to see your caregiver soon for a recheck. Most people have essential hypertension which means that there is not a specific cause. This type of high blood pressure may be lowered by changing lifestyle factors such as:  Stress.  Smoking.  Lack of exercise.  Excessive weight.  Drug/tobacco/alcohol use.  Eating less salt. Most people do not have symptoms from high blood pressure until it has caused damage to the body. Effective treatment can often prevent, delay or reduce that damage. TREATMENT  When a cause has been identified, treatment for high blood pressure is directed at the cause. There are a large number of medications to treat HTN. These fall into several categories, and your caregiver will help you select the medicines that are best for you. Medications may have side effects. You should review side effects with your caregiver. If your blood pressure stays high after you have made lifestyle changes or started on medicines,   Your medication(s) may need to be changed.  Other problems may need to be addressed.  Be certain you understand  your prescriptions, and know how and when to take your medicine.  Be sure to follow up with your caregiver within the time frame advised (usually within two weeks) to have your blood pressure rechecked and to review your medications.  If you are taking more than one medicine to lower your blood pressure, make sure you know how and at what times they should be taken. Taking two medicines at the same time  can result in blood pressure that is too low. SEEK IMMEDIATE MEDICAL CARE IF:  You develop a severe headache, blurred or changing vision, or confusion.  You have unusual weakness or numbness, or a faint feeling.  You have severe chest or abdominal pain, vomiting, or breathing problems. MAKE SURE YOU:   Understand these instructions.  Will watch your condition.  Will get help right away if you are not doing well or get worse. Document Released: 12/22/2004 Document Revised: 03/16/2011 Document Reviewed: 08/12/2007 [facility] Patient Information 2014 [place], [place]. Blood Sugar Monitoring, Adult GLUCOSE METERS FOR SELF-MONITORING OF BLOOD GLUCOSE  It is important to be able to correctly measure your blood sugar (glucose). You can use a blood glucose monitor (a small battery-operated device) to check your glucose level at any time. This allows you and your caregiver to monitor your diabetes and to determine how well your treatment plan is working. The process of monitoring your blood glucose with a glucose meter is called self-monitoring of blood glucose (SMBG). When people with diabetes control their blood sugar, they have better health. To test for glucose with a typical glucose meter, place the disposable strip in the meter. Then place a small sample of blood on the "test strip." The test strip is coated with chemicals that combine with glucose in blood. The meter measures how much glucose is present. The meter displays the glucose level as a number. Several new models can record and store a number of test results. Some models can connect to personal computers to store test results or print them out.  Newer meters are often easier to use than older models. Some meters allow you to get blood from places other than your fingertip. Some new models have automatic timing, error codes, signals, or barcode readers to help with proper adjustment (calibration). Some meters have a large display screen or  spoken instructions for people with visual impairments.  INSTRUCTIONS FOR USING GLUCOSE METERS  Wash your hands with soap and warm water, or clean the area with alcohol. Dry your hands completely.  Prick the side of your fingertip with a lancet (a sharp-pointed tool used by hand).  Hold the hand down and gently milk the finger until a small drop of blood appears. Catch the blood with the test strip.  Follow the instructions for inserting the test strip and using the SMBG meter. Most meters require the meter to be turned on and the test strip to be inserted before applying the blood sample.  Record the test result.  Read the instructions carefully for both the meter and the test strips that go with it. Meter instructions are found in the user manual. Keep this manual to help you solve any problems that may arise. Many meters use "error codes" when there is a problem with the meter, the test strip, or the blood sample on the strip. You will need the manual to understand these error codes and fix the problem.  New devices are available such as laser lancets and meters that can test blood taken from "alternative  sites" of the body, other than fingertips. However, you should use standard fingertip testing if your glucose changes rapidly. Also, use standard testing if:  You have eaten, exercised, or taken insulin in the past 2 hours.  You think your glucose is low.  You tend to not feel symptoms of low blood glucose (hypoglycemia).  You are ill or under stress.  Clean the meter as directed by the manufacturer.  Test the meter for accuracy as directed by the manufacturer.  Take your meter with you to your caregiver's office. This way, you can test your glucose in front of your caregiver to make sure you are using the meter correctly. Your caregiver can also take a sample of blood to test using a routine lab method. If values on the glucose meter are close to the lab results, you and your  caregiver will see that your meter is working well and you are using good technique. Your caregiver will advise you about what to do if the results do not match. FREQUENCY OF TESTING  Your caregiver will tell you how often you should check your blood glucose. This will depend on your type of diabetes, your current level of diabetes control, and your types of medicines. The following are general guidelines, but your care plan may be different. Record all your readings and the time of day you took them for review with your caregiver.   Diabetes type 1.  When you are using insulin with good diabetic control (either multiple daily injections or via a pump), you should check your glucose 4 times a day.  If your diabetes is not well controlled, you may need to monitor more frequently, including before meals and 2 hours after meals, at bedtime, and occasionally between 2 a.m. and 3 a.m.  You should always check your glucose before a dose of insulin or before changing the rate on your insulin pump.  Diabetes type 2.  Guidelines for SMBG in diabetes type 2 are not as well defined.  If you are on insulin, follow the guidelines above.  If you are on medicines, but not insulin, and your glucose is not well controlled, you should test at least twice daily.  If you are not on insulin, and your diabetes is controlled with medicines or diet alone, you should test at least once daily, usually before breakfast.  A weekly profile will help your caregiver advise you on your care plan. The week before your visit, check your glucose before a meal and 2 hours after a meal at least daily. You may want to test before and after a different meal each day so you and your caregiver can tell how well controlled your blood sugars are throughout the course of a 24 hour period.  Gestational diabetes (diabetes during pregnancy).  Frequent testing is often necessary. Accurate timing is important.  If you are not on  insulin, check your glucose 4 times a day. Check it before breakfast and 1 hour after the start of each meal.  If you are on insulin, check your glucose 6 times a day. Check it before each meal and 1 hour after the first bite of each meal.  General guidelines.  More frequent testing is required at the start of insulin treatment. Your caregiver will instruct you.  Test your glucose any time you suspect you have low blood sugar (hypoglycemia).  You should test more often when you change medicines, when you have unusual stress or illness, or in other unusual  circumstances. OTHER THINGS TO KNOW ABOUT GLUCOSE METERS  Measurement Range. Most glucose meters are able to read glucose levels over a broad range of values from as low as 0 to as high as 600 mg/dL. If you get an extremely high or low reading from your meter, you should first confirm it with another reading. Report very high or very low readings to your caregiver.  Whole Blood Glucose versus Plasma Glucose. Some older home glucose meters measure glucose in your whole blood. In a lab or when using some newer home glucose meters, the glucose is measured in your plasma (one component of blood). The difference can be important. It is important for you and your caregiver to know whether your meter gives its results as "whole blood equivalent" or "plasma equivalent."  Display of High and Low Glucose Values. Part of learning how to operate a meter is understanding what the meter results mean. Know how high and low glucose concentrations are displayed on your meter.  Factors that Affect Glucose Meter Performance. The accuracy of your test results depends on many factors and varies depending on the brand and type of meter. These factors include:  Low red blood cell count (anemia).  Substances in your blood (such as uric acid, vitamin C, and others).  Environmental factors (temperature, humidity, altitude).  Name-brand versus generic test  strips.  Calibration. Make sure your meter is set up properly. It is a good idea to do a calibration test with a control solution recommended by the manufacturer of your meter whenever you begin using a fresh bottle of test strips. This will help verify the accuracy of your meter.  Improperly stored, expired, or defective test strips. Keep your strips in a dry place with the lid on.  Soiled meter.  Inadequate blood sample. NEW TECHNOLOGIES FOR GLUCOSE TESTING Alternative site testing Some glucose meters allow testing blood from alternative sites. These include the:  Upper arm.  Forearm.  Base of the thumb.  Thigh. Sampling blood from alternative sites may be desirable. However, it may have some limitations. Blood in the fingertips show changes in glucose levels more quickly than blood in other parts of the body. This means that alternative site test results may be different from fingertip test results, not because of the meter's ability to test accurately, but because the actual glucose concentration can be different.  Continuous Glucose Monitoring Devices to measure your blood glucose continuously are available, and others are in development. These methods can be more expensive than self-monitoring with a glucose meter. However, it is uncertain how effective and reliable these devices are. Your caregiver will advise you if this approach makes sense for you. IF BLOOD SUGARS ARE CONTROLLED, PEOPLE WITH DIABETES REMAIN HEALTHIER.  SMBG is an important part of the treatment plan of patients with diabetes mellitus. Below are reasons for using SMBG:   It confirms that your glucose is at a specific, healthy level.  It detects hypoglycemia and severe hyperglycemia.  It allows you and your caregiver to make adjustments in response to changes in lifestyle for individuals requiring medicine.  It determines the need for starting insulin therapy in temporary diabetes that happens during pregnancy  (gestational diabetes). Document Released: 12/25/2002 Document Revised: 03/16/2011 Document Reviewed: 04/17/2010 [facility] Patient Information 2014 [place], [place].

## 2012-08-11 NOTE — Progress Notes (Signed)
Patient ID: Javier Quinn, male   DOB: 12-02-1960, 52 y.o.   MRN: [id number]  CC:  Chief Complaint  Patient presents with  . Hospitalization Follow-up  . Diabetes  . Hypertension   HPI: Pt has made significant changes in his life.  He has stopped smoking. He is able to ambulate better now.  He is following closely with vascular surgery.  He is reporting that he was recently treated for bronchitis and his symptoms are much better.  He is completing a course of antibiotics.  His blood sugars are improving now that he is taking his insulin.  He is reporting no blood sugars less than 100.   No Known Allergies Past Medical History  Diagnosis Date  . Hypertension   . Diabetes mellitus without complication   . Hyperlipidemia   . Tobacco dependence   . GERD (gastroesophageal reflux disease)   . H/O hiatal hernia   . Peripheral vascular disease    Current Outpatient Prescriptions on File Prior to Visit  Medication Sig Dispense Refill  . acetaminophen (TYLENOL) 325 MG tablet Take 650 mg by mouth every 6 (six) hours as needed for pain.      [name] albuterol (PROVENTIL HFA;VENTOLIN HFA) 108 (90 BASE) MCG/ACT inhaler Inhale 2 puffs into the lungs every 4 (four) hours as needed for wheezing.  1 each  0  . clopidogrel (PLAVIX) 75 MG tablet Take 1 tablet (75 mg total) by mouth daily with breakfast.  30 tablet  6  . doxycycline (VIBRAMYCIN) 100 MG capsule Take 1 capsule (100 mg total) by mouth 2 (two) times daily.  20 capsule  0   No current facility-administered medications on file prior to visit.   Family History  Problem Relation Age of Onset  . Cancer Mother     pancreas  . Cancer Father    History   Social History  . Marital Status: Single    Spouse Name: N/A    Number of Children: N/A  . Years of Education: N/A   Occupational History  . Not on file.   Social History Main Topics  . Smoking status: Former Smoker -- 0.50 packs/day for 20 years    Types: Cigarettes    Quit date:  04/11/2012  . Smokeless tobacco: Former Neurosurgeon  . Alcohol Use: No     Comment: occ  . Drug Use: No  . Sexually Active: Not on file   Other Topics Concern  . Not on file   Social History Narrative  . No narrative on file    Review of Systems  Constitutional: Negative for fever, chills, diaphoresis, activity change, appetite change and fatigue.  HENT: Negative for ear pain, nosebleeds, congestion, facial swelling, rhinorrhea, neck pain, neck stiffness and ear discharge.   Eyes: Negative for pain, discharge, redness, itching and visual disturbance.  Respiratory: Negative for cough, choking, chest tightness, shortness of breath, wheezing and stridor.   Cardiovascular: Negative for chest pain, palpitations and leg swelling.  Gastrointestinal: Negative for abdominal distention.  Genitourinary: Negative for dysuria, urgency, frequency, hematuria, flank pain, decreased urine volume, difficulty urinating and dyspareunia.  Musculoskeletal: Negative for back pain, joint swelling, arthralgias and gait problem. claudication  Neurological: Negative for dizziness, tremors, seizures, syncope, facial asymmetry, speech difficulty, weakness, light-headedness, numbness and headaches.  Hematological: Negative for adenopathy. Does not bruise/bleed easily.  Psychiatric/Behavioral: Negative for hallucinations, behavioral problems, confusion, dysphoric mood, decreased concentration and agitation.    Objective:   Filed Vitals:   08/10/12 1236  BP: 128/87  Pulse: 98  Temp: 98 F (36.7 C)  Resp: 16    Physical Exam  Constitutional: Appears well-developed and well-nourished. No distress.  HENT: Normocephalic. External right and left ear normal. Oropharynx is clear and moist.  Eyes: Conjunctivae and EOM are normal. PERRLA, no scleral icterus.  Neck: Normal ROM. Neck supple. No JVD. No tracheal deviation. No thyromegaly.  CVS: RRR, S1/S2 +, no murmurs, no gallops, no carotid bruit.  Pulmonary: Effort and  breath sounds normal, no stridor, rhonchi, wheezes, rales.  Abdominal: Soft. BS +,  no distension, tenderness, rebound or guarding.  Musculoskeletal: Normal range of motion. No edema and no tenderness. poor LE peripheral pulses Lymphadenopathy: No lymphadenopathy noted, cervical, inguinal. Neuro: Alert. Normal reflexes, muscle tone coordination. No cranial nerve deficit. Skin: Skin is warm and dry. No rash noted. Not diaphoretic. No erythema. No pallor.  Psychiatric: Normal mood and affect. Behavior, judgment, thought content normal.   Lab Results  Component Value Date   WBC 7.1 08/04/2012   HGB 13.7 08/04/2012   HCT 38.6* 08/04/2012   MCV 84.1 08/04/2012   PLT 282 08/04/2012   Lab Results  Component Value Date   CREATININE 0.79 08/04/2012   BUN 17 08/04/2012   NA 138 08/04/2012   K 3.9 08/04/2012   CL 100 08/04/2012   CO2 23 08/04/2012    Lab Results  Component Value Date   HGBA1C 10.8* 06/15/2012   Lipid Panel     Component Value Date/Time   CHOL 143 06/23/2012 0600   TRIG 181* 06/23/2012 0600   HDL 38* 06/23/2012 0600   CHOLHDL 3.8 06/23/2012 0600   VLDL 36 06/23/2012 0600   LDLCALC 69 06/23/2012 0600       Assessment and plan:   Patient Active Problem List   Diagnosis Date Noted  . Screening for colon cancer 08/10/2012  . Type II or unspecified type diabetes mellitus without mention of complication, uncontrolled 08/10/2012  . Dyslipidemia 08/10/2012  . PVD (peripheral vascular disease) 08/10/2012  . Aftercare following surgery of the circulatory system, NEC 08/05/2012  . Pain in limb-Bilateral leg 08/05/2012  . Peripheral vascular disease, unspecified 06/01/2012  . Critical lower limb ischemia 06/01/2012  . Preop cardiovascular exam 05/24/2012  . PVD (peripheral vascular disease) with claudication 05/18/2012  . Atherosclerotic peripheral vascular disease with intermittent claudication 05/18/2012  . Unspecified essential hypertension 05/18/2012  . Pain in joint, ankle and  foot 05/11/2012  . Pain in both feet 05/11/2012  . Bilateral swelling of feet 05/11/2012  . Uncontrolled diabetes mellitus 05/01/2012  . Type 2 diabetes mellitus 05/01/2012  . Dyslipidemia associated with type 2 diabetes mellitus 05/01/2012  . History of tobacco abuse 05/01/2012  . Claudication 04/29/2012  . Peripheral vascular disease 04/29/2012  . Hyperglycemia 04/29/2012   Screening for colon cancer - Plan: Ambulatory referral to Podiatry, Ambulatory referral to Ophthalmology, Ambulatory referral to Gastroenterology  Type II or unspecified type diabetes mellitus without mention of complication, uncontrolled - Plan: Ambulatory referral to Podiatry, Ambulatory referral to Ophthalmology, Ambulatory referral to Gastroenterology  Unspecified essential hypertension - Plan: Ambulatory referral to Podiatry, Ambulatory referral to Ophthalmology, Ambulatory referral to Gastroenterology  Dyslipidemia - Plan: Ambulatory referral to Podiatry, Ambulatory referral to Ophthalmology, Ambulatory referral to Gastroenterology  PVD (peripheral vascular disease) - Plan: Ambulatory referral to Podiatry, Ambulatory referral to Ophthalmology, Ambulatory referral to Gastroenterology  Continue current levemir dose, continue to monitor BS closely  Encouraged continued cessation of cigarettes and tobacco  Discussed hypoglycemia precautions  Check labs on follow  up appointment  The patient was given clear instructions to go to ER or return to medical center if symptoms don't improve, worsen or new problems develop.  The patient verbalized understanding.  The patient was told to call to get any lab results if not heard anything in the next week.    Follow up scheduled  Rodney Langton, MD, CDE, FAAFP [facility] [place], [place]

## 2012-08-12 ENCOUNTER — Other Ambulatory Visit: Payer: Self-pay | Admitting: *Deleted

## 2012-08-12 ENCOUNTER — Telehealth: Payer: Self-pay | Admitting: Family Medicine

## 2012-08-12 ENCOUNTER — Telehealth: Payer: Self-pay | Admitting: *Deleted

## 2012-08-12 DIAGNOSIS — I739 Peripheral vascular disease, unspecified: Secondary | ICD-10-CM

## 2012-08-12 DIAGNOSIS — Z48812 Encounter for surgical aftercare following surgery on the circulatory system: Secondary | ICD-10-CM

## 2012-08-12 NOTE — Telephone Encounter (Signed)
Patient would like a flex pen prescribed

## 2012-08-12 NOTE — Telephone Encounter (Signed)
Pt does not like needles, and would like script fro flex pen 10 units instead of what he received which was for syringe.

## 2012-08-12 NOTE — Telephone Encounter (Signed)
Raynelle Fanning, [facility] nurse called to inform Dr Imogene Burn that she had tried multiple times today to see patient and he was not at home.

## 2012-08-15 ENCOUNTER — Ambulatory Visit: Payer: Medicaid Other | Attending: Family Medicine | Admitting: Internal Medicine

## 2012-08-15 ENCOUNTER — Encounter: Payer: Self-pay | Admitting: Internal Medicine

## 2012-08-15 VITALS — BP 123/87 | HR 94 | Temp 97.8°F | Ht 71.0 in | Wt 225.0 lb

## 2012-08-15 DIAGNOSIS — I739 Peripheral vascular disease, unspecified: Secondary | ICD-10-CM | POA: Insufficient documentation

## 2012-08-15 DIAGNOSIS — E119 Type 2 diabetes mellitus without complications: Secondary | ICD-10-CM

## 2012-08-15 DIAGNOSIS — M79661 Pain in right lower leg: Secondary | ICD-10-CM | POA: Insufficient documentation

## 2012-08-15 DIAGNOSIS — M79609 Pain in unspecified limb: Secondary | ICD-10-CM

## 2012-08-15 MED ORDER — INSULIN DETEMIR 100 UNIT/ML FLEXPEN: 10.0000 [IU] | Freq: Every day | SUBCUTANEOUS | Status: DC

## 2012-08-15 MED ORDER — NAPROXEN 500 MG PO TABS: 500.0000 mg | ORAL_TABLET | Freq: Three times a day (TID) | ORAL | Status: DC

## 2012-08-15 NOTE — Progress Notes (Signed)
Patient is here today for request of pain medication and flexpen.

## 2012-08-15 NOTE — Progress Notes (Signed)
Patient ID: Javier Quinn, male   DOB: 20-Jan-1960, 52 y.o.   MRN: [id number] Patient Demographics  Javier Quinn, is a 52 y.o. male  XBJ:[id number]  HYQ:[id number]  DOB - 02-19-1960  Chief Complaint  Patient presents with  . medication request        Subjective:   Javier Quinn today is here for followup. Javier Quinn had vascular surgery on his right leg recently for peripheral vascular disease. Javier Quinn now has some pain around the surgical site . Javier Quinn also wants a prescription for Flex pen. Javier Quinn claims to be compliant with medication. Javier Quinn has quit smoking, claudication is better on his right leg, awaiting surgery also on the left. Patient has No headache, No chest pain, No abdominal pain - No Nausea, No new weakness tingling or numbness, No Cough - SOB.   Objective:    Filed Vitals:   08/15/12 1051  BP: 123/87  Pulse: 94  Temp: 97.8 F (36.6 C)  TempSrc: Oral  Height: 5\' 11"  (1.803 m)  Weight: 225 lb (102.059 kg)     ALLERGIES:  No Known Allergies  PAST MEDICAL HISTORY: Past Medical History  Diagnosis Date  . Hypertension   . Diabetes mellitus without complication   . Hyperlipidemia   . Tobacco dependence   . GERD (gastroesophageal reflux disease)   . H/O hiatal hernia   . Peripheral vascular disease     PAST SURGICAL HISTORY: Past Surgical History  Procedure Laterality Date  . Bypass graft femoral-peroneal Right 06/15/2012    Procedure: BYPASS GRAFT Javier Quinn;  Surgeon: Fransisco Hertz, MD;  Location: [facility] OR;  Service: Vascular;  Laterality: Right;  using right non-reversed saphenous vein  . Lower extremity angiogram Right 06/15/2012    Procedure: LOWER EXTREMITY ANGIOGRAM;  Surgeon: Fransisco Hertz, MD;  Location: [facility] OR;  Service: Vascular;  Laterality: Right;    FAMILY HISTORY: Family History  Problem Relation Age of Onset  . Cancer Mother     pancreas  . Cancer Father     MEDICATIONS AT HOME: Prior to Admission medications   Medication Sig Start Date End Date Taking?  Authorizing Provider  albuterol (PROVENTIL HFA;VENTOLIN HFA) 108 (90 BASE) MCG/ACT inhaler Inhale 2 puffs into the lungs every 4 (four) hours as needed for wheezing. 08/04/12  Yes Glynn Octave, MD  clopidogrel (PLAVIX) 75 MG tablet Take 1 tablet (75 mg total) by mouth daily with breakfast. 06/24/12  Yes Samantha J Rhyne, PA-C  doxycycline (VIBRAMYCIN) 100 MG capsule Take 1 capsule (100 mg total) by mouth 2 (two) times daily. 08/04/12  Yes Glynn Octave, MD  lisinopril-hydrochlorothiazide (PRINZIDE,ZESTORETIC) 20-12.5 MG per tablet Take 1 tablet by mouth daily. 08/10/12  Yes Clanford Cyndie Mull, MD  metFORMIN (GLUCOPHAGE XR) 500 MG 24 hr tablet Take 2 tablets (1,000 mg total) by mouth daily with breakfast. 08/10/12  Yes Clanford Cyndie Mull, MD  pravastatin (PRAVACHOL) 80 MG tablet Take 1 tablet (80 mg total) by mouth daily. 08/10/12  Yes Clanford Cyndie Mull, MD  acetaminophen (TYLENOL) 325 MG tablet Take 650 mg by mouth every 6 (six) hours as needed for pain.    Historical Provider, MD  insulin detemir (LEVEMIR) 100 unit/ml SOLN Inject 10 Units into the skin daily at 10 pm. 08/15/12   Jeanann Lewandowsky, MD  naproxen (NAPROSYN) 500 MG tablet Take 1 tablet (500 mg total) by mouth 3 (three) times daily with meals. 08/15/12   Jeanann Lewandowsky, MD    REVIEW OF SYSTEMS:  Constitutional:   No   Fevers,  chills, fatigue.  HEENT:    No headaches, Sore throat,   Cardio-vascular: No chest pain,  Orthopnea, swelling in lower extremities, anasarca, palpitations  GI:  No abdominal pain, nausea, vomiting, diarrhea  Resp: No shortness of breath,  No coughing up of blood.No cough.No wheezing.  Skin:  no rash or lesions.  GU:  no dysuria, change in color of urine, no urgency or frequency.  No flank pain.  Musculoskeletal: Pain on vascular surgical site Rt leg, No decreased range of motion.  No back pain.  Psych: No change in mood or affect. No depression or anxiety.  No memory loss.   Exam  General  appearance :Awake, alert, NAD, Speech Clear. HEENT: Atraumatic and Normocephalic, PERLA Neck: supple, no JVD. No cervical lymphadenopathy.  Chest: clear to auscultation bilaterally, no wheezing, rales or rhonchi CVS: S1 S2 regular, no murmurs.  Abdomen: soft, NBS, NT, ND, no gaurding, rigidity or rebound. Extremities: Rt leg Post surgical changes noted, dorsalis pedis pulse, feeble Neurology: Awake alert, and oriented X 3, CN II-XII intact, Non focal Skin:No Rash or lesions Wounds: N/A     Assessment & Plan   For his leg pain, naproxen 500 mg by mouth 3 times a day with meal prescribed  Flex pen was prescribed for diabetes to use as initially instructed  Patient was counseled about pain, encouraged to do minimal exercise  To follow up with vascular surgery  Return to clinic in 4 weeks when necessary for followup    Javier Quinn was given clear instructions to go to ER or return to the clinic if symptoms don't improve, worsen or new problems develop.  Javier Quinn verbalized understanding.  Javier Quinn was told to call to get lab results if hasn't heard anything in the next week.     Follow-up in 4 weeks   Jeanann Lewandowsky M.D. 08/15/2012, 11:22 AM

## 2012-08-22 ENCOUNTER — Other Ambulatory Visit: Payer: Self-pay | Admitting: *Deleted

## 2012-08-22 ENCOUNTER — Ambulatory Visit (INDEPENDENT_AMBULATORY_CARE_PROVIDER_SITE_OTHER): Payer: Medicaid Other | Admitting: Cardiovascular Disease

## 2012-08-22 ENCOUNTER — Telehealth: Payer: Self-pay | Admitting: *Deleted

## 2012-08-22 VITALS — BP 160/98 | HR 92 | Ht 72.0 in | Wt 228.0 lb

## 2012-08-22 DIAGNOSIS — I739 Peripheral vascular disease, unspecified: Secondary | ICD-10-CM

## 2012-08-22 DIAGNOSIS — I10 Essential (primary) hypertension: Secondary | ICD-10-CM

## 2012-08-22 DIAGNOSIS — Z0279 Encounter for issue of other medical certificate: Secondary | ICD-10-CM

## 2012-08-22 NOTE — Telephone Encounter (Signed)
I called Javier Quinn to tell him, we needed to see him prior to giving him pain medicine per Dr Imogene Burn.I will have schedulers call to make him an office vist with duplex of his right leg.

## 2012-08-22 NOTE — Progress Notes (Signed)
Patient ID: Javier Quinn, male   DOB: 1960/06/16, 52 y.o.   MRN: [id number] 52 yo smoker who quit a month ago with PVD. Needs RLE surgery with Dr Imogene Burn. Has 1 block claudication right worse than left with resting ABI:s in the .5 to .6 range. No nonhealing ulcers. Lots of pain and swelling in feet. No history of CAD. Still works detailing cars. CRF;s poorly controlled DM and HTN quit smoking a month ago. No chest pain but limited activity. Denies dyspnea palpitations or syncope  05/27/12 myovue normal EF 50%  Surgery done 6/14 with no complications  1. Right superficial femoral artery to peroneal artery bypass with ipsilateral non-reverse greater saphenous vein  2. Open superficial femoral artery cannulation  3. Right leg runoff   Has not had good result Still with lots of pain and LE swelling  ABI .79 on right and .56 on left.  Venous duplex 08/04/12 no DVT Only has single vessel runoff below right knee  ROS: Denies fever, malais, weight loss, blurry vision, decreased visual acuity, cough, sputum, SOB, hemoptysis, pleuritic pain, palpitaitons, heartburn, abdominal pain, melena, lower extremity edema, claudication, or rash.  All other systems reviewed and negative  General: Affect appropriate Obese black male HEENT: normal Neck supple with no adenopathy JVP normal no bruits no thyromegaly Lungs clear with no wheezing and good diaphragmatic motion Heart:  S1/S2 no murmur, no rub, gallop or click PMI normal Abdomen: benighn, BS positve, no tenderness, no AAA no bruit.  No HSM or HJR Distal pulses diminished S/P vascular surgery RLE with plus one edema and paresthesias No edema Neuro non-focal Skin warm and dry No muscular weakness   Current Outpatient Prescriptions  Medication Sig Dispense Refill  . acetaminophen (TYLENOL) 325 MG tablet Take 650 mg by mouth every 6 (six) hours as needed for pain.      [name] albuterol (PROVENTIL HFA;VENTOLIN HFA) 108 (90 BASE) MCG/ACT inhaler Inhale 2 puffs  into the lungs every 4 (four) hours as needed for wheezing.  1 each  0  . clopidogrel (PLAVIX) 75 MG tablet Take 1 tablet (75 mg total) by mouth daily with breakfast.  30 tablet  6  . doxycycline (VIBRAMYCIN) 100 MG capsule Take 1 capsule (100 mg total) by mouth 2 (two) times daily.  20 capsule  0  . lisinopril-hydrochlorothiazide (PRINZIDE,ZESTORETIC) 20-12.5 MG per tablet Take 1 tablet by mouth daily.  30 tablet  4  . metFORMIN (GLUCOPHAGE XR) 500 MG 24 hr tablet Take 2 tablets (1,000 mg total) by mouth daily with breakfast.      . pravastatin (PRAVACHOL) 80 MG tablet Take 1 tablet (80 mg total) by mouth daily.  30 tablet  4   No current facility-administered medications for this visit.    Allergies  Review of patient's allergies indicates no known allergies.  Electrocardiogram:  Assessment and Plan

## 2012-08-22 NOTE — Patient Instructions (Signed)
Your physician wants you to follow-up in:  6 MONTHS WITH DR NISHAN  You will receive a reminder letter in the mail two months in advance. If you don't receive a letter, please call our office to schedule the follow-up appointment. Your physician recommends that you continue on your current medications as directed. Please refer to the Current Medication list given to you today. 

## 2012-08-22 NOTE — Telephone Encounter (Signed)
Javier Quinn walked in and told the front desk he was hurting in his right groin and needed some pain medicine. I went to talk to him and he said he had been having the pain since the surgery. I asked him if he had told Dr Eden Emms who he had just seen. He said yes and was told to come get medicine here. When I asked if he had this pain when he saw Dr Imogene Burn 08/05/12. He said no,it just started last night. I told him that was a conflict with what he said earlier. He said well I meant it just started last night and I can't stand it. I asked him on a pain scale of 1-20; he said a 5. He said there was no knot or heat. The incision is closed, no drainage. I told him I would have to contact Dr Imogene Burn but I doubted we would call in any medicine without seeing him again. He gave me his phone number to let him know.

## 2012-08-22 NOTE — Telephone Encounter (Signed)
Javier Hertz, MD      Javier Quinn     MRN: [id number] DOB: 1960-07-30   Sent: Sheral Flow August 22, 2012 3:45 PM   Pt Home: [phone]     To: Melene Plan, RN                          Message    Javier Quinn   [phone]   12-Jan-1960      Needs follow-up appt for re-evaluation prior to any pain medications.

## 2012-08-22 NOTE — Assessment & Plan Note (Signed)
Taking BP meds Will monitor at home may need further escalation of meds

## 2012-08-22 NOTE — Assessment & Plan Note (Signed)
F/U Dr Imogene Burn Situation does not seem ideal  Elevate legs for edema Continue antiplatlet Rx Consider adding Pletal

## 2012-08-24 ENCOUNTER — Telehealth: Payer: Self-pay | Admitting: Vascular Surgery

## 2012-08-24 NOTE — Telephone Encounter (Signed)
Message copied by Jena Gauss on Wed Aug 24, 2012 11:33 AM ------      Message from: Phillips Odor      Created: Tue Aug 23, 2012  1:34 PM      Regarding: place on a cancellation list       Please place pt. on cancellation list/ scheduled to see [facility] on 09/09/12 and would like appt. moved-up, if there is a cancellation.  ------

## 2012-08-30 ENCOUNTER — Other Ambulatory Visit: Payer: Self-pay | Admitting: *Deleted

## 2012-08-30 DIAGNOSIS — I739 Peripheral vascular disease, unspecified: Secondary | ICD-10-CM

## 2012-08-30 DIAGNOSIS — M79609 Pain in unspecified limb: Secondary | ICD-10-CM

## 2012-09-08 ENCOUNTER — Encounter: Payer: Self-pay | Admitting: Vascular Surgery

## 2012-09-09 ENCOUNTER — Inpatient Hospital Stay (HOSPITAL_COMMUNITY): Payer: Medicaid Other | Admitting: Anesthesiology

## 2012-09-09 ENCOUNTER — Other Ambulatory Visit: Payer: Self-pay | Admitting: *Deleted

## 2012-09-09 ENCOUNTER — Encounter (HOSPITAL_COMMUNITY)
Admission: AD | Disposition: A | Discharge status: Home / Self Care | Payer: Self-pay | Source: Ambulatory Visit | Attending: Vascular Surgery

## 2012-09-09 ENCOUNTER — Encounter (INDEPENDENT_AMBULATORY_CARE_PROVIDER_SITE_OTHER): Payer: Medicaid Other | Admitting: Vascular Surgery

## 2012-09-09 ENCOUNTER — Ambulatory Visit (INDEPENDENT_AMBULATORY_CARE_PROVIDER_SITE_OTHER): Payer: Self-pay | Admitting: Vascular Surgery

## 2012-09-09 ENCOUNTER — Encounter (HOSPITAL_COMMUNITY): Payer: Self-pay | Admitting: *Deleted

## 2012-09-09 ENCOUNTER — Inpatient Hospital Stay (HOSPITAL_COMMUNITY)
Admission: AD | Admit: 2012-09-09 | Discharge: 2012-09-11 | DRG: 254 | Disposition: A | Discharge status: Home / Self Care | Payer: Medicaid Other | Source: Ambulatory Visit | Attending: Vascular Surgery | Admitting: Vascular Surgery

## 2012-09-09 ENCOUNTER — Encounter (HOSPITAL_COMMUNITY): Payer: Self-pay | Admitting: Anesthesiology

## 2012-09-09 ENCOUNTER — Encounter (HOSPITAL_COMMUNITY): Payer: Self-pay | Admitting: Pharmacy Technician

## 2012-09-09 ENCOUNTER — Encounter: Payer: Self-pay | Admitting: Vascular Surgery

## 2012-09-09 VITALS — BP 123/79 | HR 83 | Resp 18 | Ht 72.0 in | Wt 228.0 lb

## 2012-09-09 DIAGNOSIS — E785 Hyperlipidemia, unspecified: Secondary | ICD-10-CM | POA: Diagnosis present

## 2012-09-09 DIAGNOSIS — IMO0002 Reserved for concepts with insufficient information to code with codable children: Secondary | ICD-10-CM

## 2012-09-09 DIAGNOSIS — M79609 Pain in unspecified limb: Secondary | ICD-10-CM

## 2012-09-09 DIAGNOSIS — I998 Other disorder of circulatory system: Secondary | ICD-10-CM

## 2012-09-09 DIAGNOSIS — I739 Peripheral vascular disease, unspecified: Secondary | ICD-10-CM

## 2012-09-09 DIAGNOSIS — Z5900 Homelessness unspecified: Secondary | ICD-10-CM

## 2012-09-09 DIAGNOSIS — K219 Gastro-esophageal reflux disease without esophagitis: Secondary | ICD-10-CM | POA: Diagnosis present

## 2012-09-09 DIAGNOSIS — E119 Type 2 diabetes mellitus without complications: Secondary | ICD-10-CM | POA: Diagnosis present

## 2012-09-09 DIAGNOSIS — Z48812 Encounter for surgical aftercare following surgery on the circulatory system: Secondary | ICD-10-CM

## 2012-09-09 DIAGNOSIS — I743 Embolism and thrombosis of arteries of the lower extremities: Principal | ICD-10-CM | POA: Diagnosis present

## 2012-09-09 DIAGNOSIS — I999 Unspecified disorder of circulatory system: Secondary | ICD-10-CM

## 2012-09-09 DIAGNOSIS — Z87891 Personal history of nicotine dependence: Secondary | ICD-10-CM

## 2012-09-09 DIAGNOSIS — I770 Arteriovenous fistula, acquired: Secondary | ICD-10-CM | POA: Diagnosis present

## 2012-09-09 DIAGNOSIS — T82898A Other specified complication of vascular prosthetic devices, implants and grafts, initial encounter: Secondary | ICD-10-CM

## 2012-09-09 DIAGNOSIS — I10 Essential (primary) hypertension: Secondary | ICD-10-CM | POA: Diagnosis present

## 2012-09-09 DIAGNOSIS — I70219 Atherosclerosis of native arteries of extremities with intermittent claudication, unspecified extremity: Secondary | ICD-10-CM

## 2012-09-09 HISTORY — PX: LOWER EXTREMITY ANGIOGRAM: SHX5508

## 2012-09-09 LAB — COMPREHENSIVE METABOLIC PANEL
Alkaline Phosphatase: 91 U/L (ref 39–117)
BUN: 20 mg/dL (ref 6–23)
CO2: 21 mEq/L (ref 19–32)
Chloride: 102 mEq/L (ref 96–112)
Creatinine, Ser: 0.92 mg/dL (ref 0.50–1.35)
GFR calc non Af Amer: >90 mL/min (ref >90)
Glucose, Bld: 148 mg/dL — ABNORMAL HIGH (ref 70–99)
Potassium: 4.1 mEq/L (ref 3.5–5.1)
Total Bilirubin: 0.2 mg/dL — ABNORMAL LOW (ref 0.3–1.2)

## 2012-09-09 LAB — TROPONIN I: Troponin I: <0.3 ng/mL (ref <0.30)

## 2012-09-09 LAB — PROTIME-INR
INR: 0.93 (ref 0.00–1.49)
Prothrombin Time: 12.3 seconds (ref 11.6–15.2)

## 2012-09-09 LAB — CK TOTAL AND CKMB (NOT AT ARMC)
CK, MB: 2.5 ng/mL (ref 0.3–4.0)
Total CK: 113 U/L (ref 7–232)

## 2012-09-09 LAB — CBC
HCT: 39.3 % (ref 39.0–52.0)
Hemoglobin: 13.9 g/dL (ref 13.0–17.0)
MCV: 84.7 fL (ref 78.0–100.0)
RBC: 4.64 MIL/uL (ref 4.22–5.81)
WBC: 7.4 10*3/uL (ref 4.0–10.5)

## 2012-09-09 LAB — GLUCOSE, CAPILLARY: Glucose-Capillary: 135 mg/dL — ABNORMAL HIGH (ref 70–99)

## 2012-09-09 LAB — APTT: aPTT: 31 seconds (ref 24–37)

## 2012-09-09 SURGERY — ANGIOGRAM, LOWER EXTREMITY
Anesthesia: General | Site: Leg Upper | Laterality: Right | Wound class: Clean

## 2012-09-09 MED ORDER — LABETALOL HCL 5 MG/ML IV SOLN
10.0000 mg | INTRAVENOUS | Status: DC | PRN
  Filled 2012-09-09: qty 4

## 2012-09-09 MED ORDER — PHENYLEPHRINE HCL 10 MG/ML IJ SOLN
10.0000 mg | INTRAVENOUS | Status: DC | PRN
  Administered 2012-09-09: 15 ug/min via INTRAVENOUS

## 2012-09-09 MED ORDER — DEXTROSE 5 % IV SOLN
1.5000 g | Freq: Two times a day (BID) | INTRAVENOUS | Status: AC
  Administered 2012-09-09 – 2012-09-10 (×2): 1.5 g via INTRAVENOUS
  Filled 2012-09-09 (×2): qty 1.5

## 2012-09-09 MED ORDER — FENTANYL CITRATE 0.05 MG/ML IJ SOLN
INTRAMUSCULAR | Status: DC | PRN
  Administered 2012-09-09: 50 ug via INTRAVENOUS
  Administered 2012-09-09: 100 ug via INTRAVENOUS

## 2012-09-09 MED ORDER — ONDANSETRON HCL 4 MG/2ML IJ SOLN: 4.0000 mg | Freq: Four times a day (QID) | INTRAMUSCULAR | Status: DC | PRN

## 2012-09-09 MED ORDER — IODIXANOL 320 MG/ML IV SOLN
INTRAVENOUS | Status: DC | PRN
  Administered 2012-09-09: 30 mL via INTRAVENOUS
  Administered 2012-09-09: 40.5 mL via INTRAVENOUS

## 2012-09-09 MED ORDER — SODIUM CHLORIDE 0.9 % IV SOLN: INTRAVENOUS | Status: DC

## 2012-09-09 MED ORDER — SODIUM CHLORIDE 0.9 % IV SOLN
INTRAVENOUS | Status: DC
  Administered 2012-09-09 – 2012-09-10 (×2): via INTRAVENOUS

## 2012-09-09 MED ORDER — HEPARIN SODIUM (PORCINE) 1000 UNIT/ML IJ SOLN
INTRAMUSCULAR | Status: DC | PRN
  Administered 2012-09-09: 5000 [IU] via INTRAVENOUS
  Administered 2012-09-09: 3000 [IU] via INTRAVENOUS

## 2012-09-09 MED ORDER — PHENOL 1.4 % MT LIQD: 1.0000 | OROMUCOSAL | Status: DC | PRN

## 2012-09-09 MED ORDER — POTASSIUM CHLORIDE CRYS ER 20 MEQ PO TBCR: 20.0000 meq | EXTENDED_RELEASE_TABLET | Freq: Once | ORAL | Status: AC | PRN

## 2012-09-09 MED ORDER — LISINOPRIL-HYDROCHLOROTHIAZIDE 20-12.5 MG PO TABS
1.0000 | ORAL_TABLET | Freq: Every day | ORAL | Status: DC
Start: 2012-09-09 — End: 2012-09-09

## 2012-09-09 MED ORDER — ZOLPIDEM TARTRATE 5 MG PO TABS: 5.0000 mg | ORAL_TABLET | Freq: Every evening | ORAL | Status: DC | PRN

## 2012-09-09 MED ORDER — PROPOFOL 10 MG/ML IV BOLUS
INTRAVENOUS | Status: DC | PRN
  Administered 2012-09-09: 200 mg via INTRAVENOUS

## 2012-09-09 MED ORDER — HYDROCHLOROTHIAZIDE 12.5 MG PO CAPS
12.5000 mg | ORAL_CAPSULE | Freq: Every day | ORAL | Status: DC
  Administered 2012-09-10 – 2012-09-11 (×2): 12.5 mg via ORAL
  Filled 2012-09-09 (×2): qty 1

## 2012-09-09 MED ORDER — PANTOPRAZOLE SODIUM 40 MG PO TBEC
40.0000 mg | DELAYED_RELEASE_TABLET | Freq: Every day | ORAL | Status: DC
  Administered 2012-09-10 – 2012-09-11 (×2): 40 mg via ORAL
  Filled 2012-09-09 (×2): qty 1

## 2012-09-09 MED ORDER — PHENYLEPHRINE HCL 10 MG/ML IJ SOLN
INTRAMUSCULAR | Status: DC | PRN
  Administered 2012-09-09: 80 ug via INTRAVENOUS
  Administered 2012-09-09: 40 ug via INTRAVENOUS
  Administered 2012-09-09 (×2): 80 ug via INTRAVENOUS

## 2012-09-09 MED ORDER — METOPROLOL TARTRATE 1 MG/ML IV SOLN: 2.0000 mg | INTRAVENOUS | Status: DC | PRN

## 2012-09-09 MED ORDER — ACETAMINOPHEN 325 MG PO TABS: 325.0000 mg | ORAL_TABLET | ORAL | Status: DC | PRN

## 2012-09-09 MED ORDER — CLOPIDOGREL BISULFATE 75 MG PO TABS
75.0000 mg | ORAL_TABLET | Freq: Every day | ORAL | Status: DC
  Administered 2012-09-10 – 2012-09-11 (×2): 75 mg via ORAL
  Filled 2012-09-09 (×3): qty 1

## 2012-09-09 MED ORDER — SODIUM CHLORIDE 0.9 % IV SOLN: 500.0000 mL | Freq: Once | INTRAVENOUS | Status: AC | PRN

## 2012-09-09 MED ORDER — GLYCOPYRROLATE 0.2 MG/ML IJ SOLN
INTRAMUSCULAR | Status: DC | PRN
  Administered 2012-09-09: 0.2 mg via INTRAVENOUS

## 2012-09-09 MED ORDER — DOPAMINE-DEXTROSE 3.2-5 MG/ML-% IV SOLN: 3.0000 ug/kg/min | INTRAVENOUS | Status: DC

## 2012-09-09 MED ORDER — 0.9 % SODIUM CHLORIDE (POUR BTL) OPTIME
TOPICAL | Status: DC | PRN
  Administered 2012-09-09: 1000 mL

## 2012-09-09 MED ORDER — METFORMIN HCL ER 500 MG PO TB24
1000.0000 mg | ORAL_TABLET | Freq: Every day | ORAL | Status: DC
  Filled 2012-09-09: qty 2

## 2012-09-09 MED ORDER — OXYCODONE HCL 5 MG PO TABS
5.0000 mg | ORAL_TABLET | ORAL | Status: DC | PRN
  Administered 2012-09-10 (×2): 5 mg via ORAL
  Administered 2012-09-11: 10 mg via ORAL
  Filled 2012-09-09 (×2): qty 1
  Filled 2012-09-09: qty 2

## 2012-09-09 MED ORDER — INSULIN ASPART 100 UNIT/ML ~~LOC~~ SOLN
0.0000 [IU] | Freq: Three times a day (TID) | SUBCUTANEOUS | Status: DC
  Administered 2012-09-10: 2 [IU] via SUBCUTANEOUS
  Administered 2012-09-10 – 2012-09-11 (×3): 3 [IU] via SUBCUTANEOUS
  Administered 2012-09-11: 2 [IU] via SUBCUTANEOUS

## 2012-09-09 MED ORDER — ALBUMIN HUMAN 5 % IV SOLN
INTRAVENOUS | Status: DC | PRN
  Administered 2012-09-09: 15:00:00 via INTRAVENOUS

## 2012-09-09 MED ORDER — MUPIROCIN 2 % EX OINT
TOPICAL_OINTMENT | Freq: Once | CUTANEOUS | Status: AC
  Administered 2012-09-09: 1 via NASAL
  Filled 2012-09-09 (×2): qty 22

## 2012-09-09 MED ORDER — MORPHINE SULFATE 2 MG/ML IJ SOLN
2.0000 mg | INTRAMUSCULAR | Status: DC | PRN
  Administered 2012-09-09 – 2012-09-10 (×2): 2 mg via INTRAVENOUS
  Filled 2012-09-09 (×2): qty 1

## 2012-09-09 MED ORDER — ACETAMINOPHEN 650 MG RE SUPP: 325.0000 mg | RECTAL | Status: DC | PRN

## 2012-09-09 MED ORDER — DEXTROSE 5 % IV SOLN
1.5000 g | Freq: Once | INTRAVENOUS | Status: DC
  Filled 2012-09-09 (×2): qty 1.5

## 2012-09-09 MED ORDER — LACTATED RINGERS IV SOLN
INTRAVENOUS | Status: DC | PRN
  Administered 2012-09-09 (×3): via INTRAVENOUS

## 2012-09-09 MED ORDER — LIDOCAINE HCL (CARDIAC) 20 MG/ML IV SOLN
INTRAVENOUS | Status: DC | PRN
  Administered 2012-09-09: 75 mg via INTRAVENOUS

## 2012-09-09 MED ORDER — CEFUROXIME SODIUM 1.5 G IJ SOLR
1.5000 g | INTRAMUSCULAR | Status: AC
  Administered 2012-09-09: 1.5 g via INTRAVENOUS
  Filled 2012-09-09 (×2): qty 1.5

## 2012-09-09 MED ORDER — SIMVASTATIN 5 MG PO TABS
5.0000 mg | ORAL_TABLET | Freq: Every day | ORAL | Status: DC
  Administered 2012-09-10 – 2012-09-11 (×2): 5 mg via ORAL
  Filled 2012-09-09 (×2): qty 1

## 2012-09-09 MED ORDER — HYDRALAZINE HCL 20 MG/ML IJ SOLN: 10.0000 mg | INTRAMUSCULAR | Status: DC | PRN

## 2012-09-09 MED ORDER — SODIUM CHLORIDE 0.9 % IR SOLN
Status: DC | PRN
  Administered 2012-09-09: 15:00:00

## 2012-09-09 MED ORDER — LACTATED RINGERS IV SOLN
INTRAVENOUS | Status: DC
  Administered 2012-09-09: 13:00:00 via INTRAVENOUS

## 2012-09-09 MED ORDER — ALBUTEROL SULFATE HFA 108 (90 BASE) MCG/ACT IN AERS
2.0000 | INHALATION_SPRAY | RESPIRATORY_TRACT | Status: DC | PRN
  Filled 2012-09-09: qty 6.7

## 2012-09-09 MED ORDER — HYDROMORPHONE HCL PF 1 MG/ML IJ SOLN: 0.2500 mg | INTRAMUSCULAR | Status: DC | PRN

## 2012-09-09 MED ORDER — LISINOPRIL 20 MG PO TABS
20.0000 mg | ORAL_TABLET | Freq: Every day | ORAL | Status: DC
  Administered 2012-09-10 – 2012-09-11 (×2): 20 mg via ORAL
  Filled 2012-09-09 (×2): qty 1

## 2012-09-09 MED ORDER — DOCUSATE SODIUM 100 MG PO CAPS
100.0000 mg | ORAL_CAPSULE | Freq: Every day | ORAL | Status: DC
  Administered 2012-09-10 – 2012-09-11 (×2): 100 mg via ORAL
  Filled 2012-09-09 (×2): qty 1

## 2012-09-09 MED ORDER — THROMBIN 20000 UNITS EX SOLR
CUTANEOUS | Status: AC
  Filled 2012-09-09: qty 20000

## 2012-09-09 MED ORDER — MIDAZOLAM HCL 5 MG/5ML IJ SOLN
INTRAMUSCULAR | Status: DC | PRN
  Administered 2012-09-09: 2 mg via INTRAVENOUS

## 2012-09-09 MED ORDER — GUAIFENESIN-DM 100-10 MG/5ML PO SYRP: 15.0000 mL | ORAL_SOLUTION | ORAL | Status: DC | PRN

## 2012-09-09 MED ORDER — PROTAMINE SULFATE 10 MG/ML IV SOLN
INTRAVENOUS | Status: DC | PRN
  Administered 2012-09-09 (×5): 10 mg via INTRAVENOUS

## 2012-09-09 MED ORDER — ALUM & MAG HYDROXIDE-SIMETH 200-200-20 MG/5ML PO SUSP: 15.0000 mL | ORAL | Status: DC | PRN

## 2012-09-09 SURGICAL SUPPLY — 69 items
ADH SKN CLS APL DERMABOND .7 (GAUZE/BANDAGES/DRESSINGS) ×1
BAG BANDED W/RUBBER/TAPE 36X54 (MISCELLANEOUS) ×1 IMPLANT
BAG EQP BAND 135X91 W/RBR TAPE (MISCELLANEOUS) ×1
BAG SNAP BAND KOVER 36X36 (MISCELLANEOUS) ×1 IMPLANT
BLADE SURG ROTATE 9660 (MISCELLANEOUS) ×1 IMPLANT
CANISTER SUCTION 2500CC (MISCELLANEOUS) ×1 IMPLANT
CATH BEACON 5.038 65CM KMP-01 (CATHETERS) ×1 IMPLANT
CATH OMNI FLUSH .035X70CM (CATHETERS) ×1 IMPLANT
CATH QUICKCROSS .035X135CM (MICROCATHETER) ×1 IMPLANT
CATH STRAIGHT 5FR 65CM (CATHETERS) ×1 IMPLANT
CLIP TI MEDIUM 24 (CLIP) ×1 IMPLANT
CLIP TI WIDE RED SMALL 24 (CLIP) ×1 IMPLANT
CLOTH BEACON ORANGE TIMEOUT ST (SAFETY) ×1 IMPLANT
COVER DOME SNAP 22 D (MISCELLANEOUS) ×1 IMPLANT
COVER SURGICAL LIGHT HANDLE (MISCELLANEOUS) ×1 IMPLANT
DERMABOND ADVANCED (GAUZE/BANDAGES/DRESSINGS) ×1
DERMABOND ADVANCED .7 DNX12 (GAUZE/BANDAGES/DRESSINGS) IMPLANT
DEVICE TORQUE H2O (MISCELLANEOUS) ×1 IMPLANT
DRAPE WARM FLUID 44X44 (DRAPE) ×1 IMPLANT
DRSG TEGADERM 2-3/8X2-3/4 SM (GAUZE/BANDAGES/DRESSINGS) ×1 IMPLANT
ELECT REM PT RETURN 9FT ADLT (ELECTROSURGICAL) ×2
ELECTRODE REM PT RTRN 9FT ADLT (ELECTROSURGICAL) IMPLANT
FLEXOR CHECK FLO INTRODUCER ×1 IMPLANT
GAUZE SPONGE 2X2 8PLY STRL LF (GAUZE/BANDAGES/DRESSINGS) IMPLANT
GLOVE BIO SURGEON STRL SZ 6.5 (GLOVE) ×4 IMPLANT
GLOVE BIO SURGEON STRL SZ7 (GLOVE) ×1 IMPLANT
GLOVE BIOGEL PI IND STRL 6.5 (GLOVE) IMPLANT
GLOVE BIOGEL PI IND STRL 7.5 (GLOVE) IMPLANT
GLOVE BIOGEL PI INDICATOR 6.5 (GLOVE) ×1
GLOVE BIOGEL PI INDICATOR 7.5 (GLOVE) ×1
GLOVE SURG SS PI 6.5 STRL IVOR (GLOVE) ×1 IMPLANT
GLOVE SURG SS PI 7.0 STRL IVOR (GLOVE) ×1 IMPLANT
GOWN STRL NON-REIN LRG LVL3 (GOWN DISPOSABLE) ×3 IMPLANT
GOWN STRL REIN XL XLG (GOWN DISPOSABLE) ×1 IMPLANT
GUIDEWIRE ANGLED .035X150CM (WIRE) ×1 IMPLANT
GUIDEWIRE ANGLED .035X260CM (WIRE) ×1 IMPLANT
KIT BASIN OR (CUSTOM PROCEDURE TRAY) ×1 IMPLANT
KIT ENCORE 26 ADVANTAGE (KITS) ×1 IMPLANT
KIT ROOM TURNOVER OR (KITS) ×1 IMPLANT
NDL PERC 18GX7CM (NEEDLE) IMPLANT
NEEDLE PERC 18GX7CM (NEEDLE) ×2 IMPLANT
NS IRRIG 1000ML POUR BTL (IV SOLUTION) ×2 IMPLANT
PACK PERIPHERAL VASCULAR (CUSTOM PROCEDURE TRAY) ×1 IMPLANT
PAD ARMBOARD 7.5X6 YLW CONV (MISCELLANEOUS) ×2 IMPLANT
SET MICROPUNCTURE 5F STIFF (MISCELLANEOUS) ×1 IMPLANT
SHEATH AVANTI 11CM 5FR (MISCELLANEOUS) ×2 IMPLANT
SLEEK OTW PTA DILATATION CATHETER ×1 IMPLANT
SPONGE GAUZE 2X2 STER 10/PKG (GAUZE/BANDAGES/DRESSINGS) ×1
STOPCOCK MORSE 400PSI 3WAY (MISCELLANEOUS) ×2 IMPLANT
SUT MNCRL AB 4-0 PS2 18 (SUTURE) IMPLANT
SUT PROLENE 5 0 C 1 24 (SUTURE) ×1 IMPLANT
SUT PROLENE 6 0 BV (SUTURE) ×1 IMPLANT
SUT VIC AB 2-0 CT1 27 (SUTURE)
SUT VIC AB 2-0 CT1 TAPERPNT 27 (SUTURE) IMPLANT
SUT VIC AB 3-0 SH 27 (SUTURE)
SUT VIC AB 3-0 SH 27X BRD (SUTURE) IMPLANT
SYR 3ML LL SCALE MARK (SYRINGE) ×1 IMPLANT
SYR MEDRAD MARK V 150ML (SYRINGE) ×1 IMPLANT
TOWEL OR 17X24 6PK STRL BLUE (TOWEL DISPOSABLE) ×2 IMPLANT
TOWEL OR 17X26 10 PK STRL BLUE (TOWEL DISPOSABLE) ×1 IMPLANT
TRAY FOLEY CATH 16FRSI W/METER (SET/KITS/TRAYS/PACK) ×1 IMPLANT
TUBING HIGH PRESSURE 120CM (CONNECTOR) ×1 IMPLANT
UNDERPAD 30X30 INCONTINENT (UNDERPADS AND DIAPERS) ×1 IMPLANT
WATER STERILE IRR 1000ML POUR (IV SOLUTION) ×1 IMPLANT
WIRE BENTSON .035X145CM (WIRE) ×1 IMPLANT
WIRE HI TORQ VERSACORE J 260CM (WIRE) ×1 IMPLANT
WIRE ROSEN-J .035X260CM (WIRE) ×1 IMPLANT
WIRE SPARTACORE .014X190CM (WIRE) ×1 IMPLANT
WIRE SPARTACORE .014X300CM (WIRE) ×2 IMPLANT

## 2012-09-09 NOTE — Op Note (Signed)
OPERATIVE NOTE   PROCEDURE: 1.  Left common femoral artery cannulation under ultrasound guidance 2.  Third order arterial selection 3.  Left leg runoff 4.  Angioplasty of distal bypass and peroneal artery  PRE-OPERATIVE DIAGNOSIS: distal bypass stenosis versus tibial occlusion  POST-OPERATIVE DIAGNOSIS: distal bypass occlusion  SURGEON: Leonides Sake, MD  ANESTHESIA: conscious sedation  ESTIMATED BLOOD LOSS: 100 cc  CONTRAST: 150 cc  FINDING(S): Total occlusion of distal vein bypass to peroneal artery: partial reconstitution with angioplasty, complicated by arteriovenous fistula into popliteal vein Patent peroneal artery with filling via collaterals  SPECIMEN(S):  none  INDICATIONS:   Javier Quinn is a 52 y.o. male who presents with worsening of symptoms in his right leg in the setting of a high risk right superficial femoral artery to peroneal artery/tibioperoneal trunk bypass with marginal greater saphenous vein.  The patient presents for: right leg angiogram and possible intervention.  I discussed with the patient the nature of angiographic procedures, especially the limited patencies of any endovascular intervention.  The patient is aware of that the risks of an angiographic procedure include but are not limited to: bleeding, infection, access site complications, renal failure, embolization, rupture of vessel, dissection, possible need for emergent surgical intervention, possible need for surgical procedures to treat the patient's pathology, and stroke and death.   The patient is aware he is at high risk for limb loss due to severe tibial artery disease.  The patient is aware of the risks and agrees to proceed.  DESCRIPTION: After full informed consent was obtained from the patient, the patient was brought back to the operating room.  The patient was placed supine upon the angiography table and connected to monitoring equipment.  The patient was then given conscious sedation, the  amounts of which are documented in the patient's chart.  The patient was prepped and drape in the standard fashion for an angiographic procedure.  At this point, attention was turned to the left groin.  Under ultrasound guidance, the left common femoral artery was cannulated with a micropuncture needle.  The microwire was advanced into the iliac arterial system.  The needle was exchanged for a microsheath, which was loaded into the common femoral artery over the wire.  The microwire was exchanged for a [facility] wire which was advanced into the aorta.  The microsheath was then exchanged for a 5-Fr sheath which was loaded into the common femoral artery.  The Omniflush catheter was then loaded over the wire up to the level of just proximal to the aortic bifurcation.  The [facility] wire was replaced in the catheter, and using the [place] and Omniflush catheter, the right common iliac artery was selected.  The wire was advanced into the common femoral artery but the catheter wound not track, so it was exchanged for an end-hole catheter which did track into the presumed common femoral artery.  I did a hand injection to determine the anatomy and found I was in the profunda femoral artery.  I pulled back into the common femoral artery and connected the catheter to the power injector circuit after performing a de-airring and de-clotting maneuver.    I then completed a right leg runoff in stations with the power injector.  This demonstrated too occlusion of the distal bypass graft, likely in the segment previously found to be somewhat thickened.  I exchanged the end-hole catheter for a KMP catheter and then exchanged the wire for a Glidewire.  I selected the superficial femoral artery without any difficulty.  I advanced the KMP catheter into the bypass conduit.  The wire was exchanged for a Rosen wire.  The patient's left femoral sheath was exchanged for a 6-Fr Ansel sheath, which was lodged in the right superficial femoral  artery.  The dilator was removed.  The patient was given 8000 units of Heparin intravenously, which was a therapeutic bolus.  At this point, I exchanged the wire for a long Glidewire which was used with a Quickcross wire to select the bypass conduit.  I was able to advance the wire and catheter down to the distal vein conduit.  I did a hand injection to better image this segment of the bypass.  Unexpectedly, this also demonstrated a patent proximal peroneal artery fed by collaterals, which the previous stationary power injection did not demonstrate.   The distal anastomosis appeared occluded.  I was able to get a 0.014" Spartacore through the distal stenosis into the peroneal artery.  I did a hand injection via the Quickcross catheter to demonstrate successful re-entry into the distal target.  I exchanged the catheter for a 3 mm x 220 mm angioplasty balloon.  I was inflated to 14 atm, demonstrating several waists within the bypass conduit and artery, for 2 minutes.  I then deflated the balloon to 4 atm for another 2 minutes.  I did hand injections which demonstrated incomplete reconstitution of distal segment of the vein bypass.  Unfortunately, I also noticed delayed filling of one of the popliteal veins, suggesting perforation into the vein.  At this point, I did not feel that further intervention was possible due to the vein perforation.  I gave the patient 50 mg of Protamine to reverse anticoagulation.  The balloon was exchanged for the Quickcross catheter and hand injections verified the arteriovenous fistula.  There did not appear to be a large amount of flow into the vein due to the residual stenosis in the vein bypass.  The wire and catheter were removed.  I pulled the sheath back into the left external iliac artery.  The sheath was aspirated.  No clots were present and the sheath was reloaded with heparinized saline.  After waiting 3 minutes after protamine was completed administered, I pulled the left  sheath and held pressure for 15 minutes.  A sterile dressing was applied to the left puncture wound.  The patient will be observed in the stepdown ICU for possible development of venous hypertension and a compartment syndrome.  Due to the severity of this patient's symptoms, I doubt this patient will be willing to manage his rest pain with conservative measures, so I suspect a right above-knee amputation will necessary in the near future.  COMPLICATIONS: arteriovenous fistula with popliteal vein  CONDITION: stable  Leonides Sake, MD [facility] [place] Office: [phone] Pager: [phone]  09/09/2012, 5:10 PM

## 2012-09-09 NOTE — Interval H&P Note (Signed)
[facility] [place]  History and Physical Update  The patient was interviewed and re-examined.  The patient's previous History and Physical has been reviewed and is unchanged from my consult on: 09/09/12.  There is no change in the plan of care: right leg runoff, possible intervention.  Leonides Sake, MD [facility] [place] Office: [phone] Pager: [phone]  09/09/2012, 12:22 PM

## 2012-09-09 NOTE — Transfer of Care (Signed)
Immediate Anesthesia Transfer of Care Note  Patient: Javier Quinn  Procedure(s) Performed: Procedure(s): LOWER EXTREMITY ANGIOGRAM With Agioplasty Bypass Graft (Right)  Patient Location: PACU  Anesthesia Type:General  Level of Consciousness: awake and alert   Airway & Oxygen Therapy: Patient Spontanous Breathing and Patient connected to nasal cannula oxygen  Post-op Assessment: Report given to PACU RN and Post -op Vital signs reviewed and stable  Post vital signs: Reviewed and stable  Complications: No apparent anesthesia complications

## 2012-09-09 NOTE — Progress Notes (Signed)
[facility] [place]  Established Previous Bypass  History of Present Illness  Javier Quinn is a 52 y.o. (1960/10/22) male s/p Right superficial femoral artery to peroneal artery bypass with ipsilateral non-reverse greater saphenous vein (Date: 06/15/12) who presents with chief complaint: R leg pain and swelling. The patient has rest pain and wounds include: none. The patient notes symptoms have worsened over the last few weeks.  The pt has some worsening anterior anesthesia and decreased swelling in both legs with extended standing. The patient's treatment regimen currently included: maximal medical management. The patient notes successful tobacco cessation.  Past Medical History  Diagnosis Date  . Hypertension   . Diabetes mellitus without complication   . Hyperlipidemia   . Tobacco dependence   . GERD (gastroesophageal reflux disease)   . H/O hiatal hernia   . Peripheral vascular disease     Past Surgical History  Procedure Laterality Date  . Bypass graft femoral-peroneal Right 06/15/2012    Procedure: BYPASS GRAFT [place];  Surgeon: Fransisco Hertz, MD;  Location: [facility] OR;  Service: Vascular;  Laterality: Right;  using right non-reversed saphenous vein  . Lower extremity angiogram Right 06/15/2012    Procedure: LOWER EXTREMITY ANGIOGRAM;  Surgeon: Fransisco Hertz, MD;  Location: [facility] OR;  Service: Vascular;  Laterality: Right;    History   Social History  . Marital Status: Single    Spouse Name: N/A    Number of Children: N/A  . Years of Education: N/A   Occupational History  . Not on file.   Social History Main Topics  . Smoking status: Former Smoker -- 0.50 packs/day for 20 years    Types: Cigarettes    Quit date: 04/11/2012  . Smokeless tobacco: Former Neurosurgeon  . Alcohol Use: No     Comment: occ  . Drug Use: No  . Sexual Activity: Not on file   Other Topics Concern  . Not on file   Social History Narrative  . No narrative on file    Family  History  Problem Relation Age of Onset  . Cancer Mother     pancreas  . Cancer Father     Current Outpatient Prescriptions on File Prior to Visit  Medication Sig Dispense Refill  . acetaminophen (TYLENOL) 325 MG tablet Take 650 mg by mouth every 6 (six) hours as needed for pain.      [name] albuterol (PROVENTIL HFA;VENTOLIN HFA) 108 (90 BASE) MCG/ACT inhaler Inhale 2 puffs into the lungs every 4 (four) hours as needed for wheezing.  1 each  0  . clopidogrel (PLAVIX) 75 MG tablet Take 1 tablet (75 mg total) by mouth daily with breakfast.  30 tablet  6  . doxycycline (VIBRAMYCIN) 100 MG capsule Take 1 capsule (100 mg total) by mouth 2 (two) times daily.  20 capsule  0  . lisinopril-hydrochlorothiazide (PRINZIDE,ZESTORETIC) 20-12.5 MG per tablet Take 1 tablet by mouth daily.  30 tablet  4  . metFORMIN (GLUCOPHAGE XR) 500 MG 24 hr tablet Take 2 tablets (1,000 mg total) by mouth daily with breakfast.      . pravastatin (PRAVACHOL) 80 MG tablet Take 1 tablet (80 mg total) by mouth daily.  30 tablet  4   No current facility-administered medications on file prior to visit.    No Known Allergies  REVIEW OF SYSTEMS:  (Positives checked otherwise negative)  CARDIOVASCULAR:  []  chest pain, []  chest pressure, []  palpitations, []  shortness of breath when laying flat, []  shortness  of breath with exertion,  [x]  pain in feet when walking, [x]  pain in feet when laying flat, []  history of blood clot in veins (DVT), []  history of phlebitis, [x]  swelling in legs, []  varicose veins  PULMONARY:  []  productive cough, []  asthma, []  wheezing  NEUROLOGIC:  []  weakness in arms or legs, [x]  numbness in arms or legs, []  difficulty speaking or slurred speech, []  temporary loss of vision in one eye, []  dizziness  HEMATOLOGIC:  []  bleeding problems, []  problems with blood clotting too easily  MUSCULOSKEL:  []  joint pain, []  joint swelling  GASTROINTEST:  []  vomiting blood, []  blood in stool     GENITOURINARY:  []   burning with urination, []  blood in urine  PSYCHIATRIC:  []  history of major depression  INTEGUMENTARY:  []  rashes, []  ulcers  Physical Examination  Filed Vitals:   09/09/12 1002  BP: 123/79  Pulse: 83  Resp: 18  Height: 6' (1.829 m)  Weight: 228 lb (103.42 kg)   Body mass index is 30.92 kg/(m^2).  General: A&O x 3, WD, Obese,   Pulmonary: Sym exp, good air movt, CTAB, no rales, rhonchi, & wheezing  Cardiac: RRR, Nl S1, S2, no Murmurs, rubs or gallops  Vascular: Vessel Right Left  Radial Palpable Palpable  Brachial Palpable Palpable  Carotid Palpable, without bruit Palpable, without bruit  Aorta Not palpable N/A  Femoral Palpable Palpable  Popliteal Not palpable Not palpable  PT Not Palpable NotPalpable  DP Not Palpable Not Palpable   Gastrointestinal: soft, NTND, -G/R, - HSM, - masses, - CVAT B  Musculoskeletal: M/S 5/5 throughout except right foot DF/PF 4/5, Extremities without ischemic changes , R foot is relatively warm  Neurologic: Pain and light touch intact in extremities including R foot, Motor exam as listed above  Non-Invasive Vascular Imaging RLE Arterial Duplex (Date: 09/09/2012)  Patent graft  Occluded outflow  Medical Decision Making  Javier Quinn is a 52 y.o. male who presents with: s/p R SFA to peroneal bypass with ips NR GSV with occluded tibial arteries.   This patient does not have adequate vein conduit to jump any further distance distally.  Additionally, his intraoperative findings were consistent with chronic severe tibial arteries disease, so I'm not surprised with the findings.  I think it is worth completing a R leg angiogram, with possible endovascular intervention.  If this is not successful, I doubt there is any advantage to trying to thrombectomize the peroneal artery as it is severely diseased.  I have discussed with the patient that he is at high risk for need for amputation.  I discussed with the patient the nature of  angiographic procedures, especially the limited patencies of any endovascular intervention.  The patient is aware of that the risks of an angiographic procedure include but are not limited to: bleeding, infection, access site complications, renal failure, embolization, rupture of vessel, dissection, possible need for emergent surgical intervention, possible need for surgical procedures to treat the patient's pathology, anaphylactic reaction to contrast, and stroke and death.    The patient is aware of the risks and agrees to proceed.  Thank you for allowing [place] to participate in this patient's care.  Leonides Sake, MD [facility] [place] Office: [phone] Pager: [phone]  09/09/2012, 11:22 AM

## 2012-09-09 NOTE — H&P (View-Only) (Signed)
[facility] [place]  Established Previous Bypass  History of Present Illness  Javier Quinn is a 52 y.o. (08/19/1960) male s/p Right superficial femoral artery to peroneal artery bypass with ipsilateral non-reverse greater saphenous vein (Date: 06/15/12) who presents with chief complaint: R leg pain and swelling. The patient has rest pain and wounds include: none. The patient notes symptoms have worsened over the last few weeks.  The pt has some worsening anterior anesthesia and decreased swelling in both legs with extended standing. The patient's treatment regimen currently included: maximal medical management. The patient notes successful tobacco cessation.  Past Medical History  Diagnosis Date  . Hypertension   . Diabetes mellitus without complication   . Hyperlipidemia   . Tobacco dependence   . GERD (gastroesophageal reflux disease)   . H/O hiatal hernia   . Peripheral vascular disease     Past Surgical History  Procedure Laterality Date  . Bypass graft femoral-peroneal Right 06/15/2012    Procedure: BYPASS GRAFT FEMORAL-PERONEAL;  Surgeon: Janae Bonser L Alyssa Rotondo, MD;  Location: [facility] OR;  Service: Vascular;  Laterality: Right;  using right non-reversed saphenous vein  . Lower extremity angiogram Right 06/15/2012    Procedure: LOWER EXTREMITY ANGIOGRAM;  Surgeon: Linden Tagliaferro L Quron Ruddy, MD;  Location: [facility] OR;  Service: Vascular;  Laterality: Right;    History   Social History  . Marital Status: Single    Spouse Name: N/A    Number of Children: N/A  . Years of Education: N/A   Occupational History  . Not on file.   Social History Main Topics  . Smoking status: Former Smoker -- 0.50 packs/day for 20 years    Types: Cigarettes    Quit date: 04/11/2012  . Smokeless tobacco: Former User  . Alcohol Use: No     Comment: occ  . Drug Use: No  . Sexual Activity: Not on file   Other Topics Concern  . Not on file   Social History Narrative  . No narrative on file    Family  History  Problem Relation Age of Onset  . Cancer Mother     pancreas  . Cancer Father     Current Outpatient Prescriptions on File Prior to Visit  Medication Sig Dispense Refill  . acetaminophen (TYLENOL) 325 MG tablet Take 650 mg by mouth every 6 (six) hours as needed for pain.      . albuterol (PROVENTIL HFA;VENTOLIN HFA) 108 (90 BASE) MCG/ACT inhaler Inhale 2 puffs into the lungs every 4 (four) hours as needed for wheezing.  1 each  0  . clopidogrel (PLAVIX) 75 MG tablet Take 1 tablet (75 mg total) by mouth daily with breakfast.  30 tablet  6  . doxycycline (VIBRAMYCIN) 100 MG capsule Take 1 capsule (100 mg total) by mouth 2 (two) times daily.  20 capsule  0  . lisinopril-hydrochlorothiazide (PRINZIDE,ZESTORETIC) 20-12.5 MG per tablet Take 1 tablet by mouth daily.  30 tablet  4  . metFORMIN (GLUCOPHAGE XR) 500 MG 24 hr tablet Take 2 tablets (1,000 mg total) by mouth daily with breakfast.      . pravastatin (PRAVACHOL) 80 MG tablet Take 1 tablet (80 mg total) by mouth daily.  30 tablet  4   No current facility-administered medications on file prior to visit.    No Known Allergies  REVIEW OF SYSTEMS:  (Positives checked otherwise negative)  CARDIOVASCULAR:  [] chest pain, [] chest pressure, [] palpitations, [] shortness of breath when laying flat, [] shortness   of breath with exertion,  [x] pain in feet when walking, [x] pain in feet when laying flat, [] history of blood clot in veins (DVT), [] history of phlebitis, [x] swelling in legs, [] varicose veins  PULMONARY:  [] productive cough, [] asthma, [] wheezing  NEUROLOGIC:  [] weakness in arms or legs, [x] numbness in arms or legs, [] difficulty speaking or slurred speech, [] temporary loss of vision in one eye, [] dizziness  HEMATOLOGIC:  [] bleeding problems, [] problems with blood clotting too easily  MUSCULOSKEL:  [] joint pain, [] joint swelling  GASTROINTEST:  [] vomiting blood, [] blood in stool     GENITOURINARY:  []  burning with urination, [] blood in urine  PSYCHIATRIC:  [] history of major depression  INTEGUMENTARY:  [] rashes, [] ulcers  Physical Examination  Filed Vitals:   09/09/12 1002  BP: 123/79  Pulse: 83  Resp: 18  Height: 6' (1.829 m)  Weight: 228 lb (103.42 kg)   Body mass index is 30.92 kg/(m^2).  General: A&O x 3, WD, Obese,   Pulmonary: Sym exp, good air movt, CTAB, no rales, rhonchi, & wheezing  Cardiac: RRR, Nl S1, S2, no Murmurs, rubs or gallops  Vascular: Vessel Right Left  Radial Palpable Palpable  Brachial Palpable Palpable  Carotid Palpable, without bruit Palpable, without bruit  Aorta Not palpable N/A  Femoral Palpable Palpable  Popliteal Not palpable Not palpable  PT Not Palpable NotPalpable  DP Not Palpable Not Palpable   Gastrointestinal: soft, NTND, -G/R, - HSM, - masses, - CVAT B  Musculoskeletal: M/S 5/5 throughout except right foot DF/PF 4/5, Extremities without ischemic changes , R foot is relatively warm  Neurologic: Pain and light touch intact in extremities including R foot, Motor exam as listed above  Non-Invasive Vascular Imaging RLE Arterial Duplex (Date: 09/09/2012)  Patent graft  Occluded outflow  Medical Decision Making  Javier Quinn is a 52 y.o. male who presents with: s/p R SFA to peroneal bypass with ips NR GSV with occluded tibial arteries.   This patient does not have adequate vein conduit to jump any further distance distally.  Additionally, his intraoperative findings were consistent with chronic severe tibial arteries disease, so I'm not surprised with the findings.  I think it is worth completing a R leg angiogram, with possible endovascular intervention.  If this is not successful, I doubt there is any advantage to trying to thrombectomize the peroneal artery as it is severely diseased.  I have discussed with the patient that he is at high risk for need for amputation.  I discussed with the patient the nature of  angiographic procedures, especially the limited patencies of any endovascular intervention.  The patient is aware of that the risks of an angiographic procedure include but are not limited to: bleeding, infection, access site complications, renal failure, embolization, rupture of vessel, dissection, possible need for emergent surgical intervention, possible need for surgical procedures to treat the patient's pathology, anaphylactic reaction to contrast, and stroke and death.    The patient is aware of the risks and agrees to proceed.  Thank you for allowing us to participate in this patient's care.  Alano Blasco, MD [facility] [place] Office: [phone] Pager: [phone]  09/09/2012, 11:22 AM      

## 2012-09-09 NOTE — Anesthesia Preprocedure Evaluation (Addendum)
Anesthesia Evaluation  Patient identified by MRN, date of birth, ID band Patient awake    Reviewed: Allergy & Precautions, H&P , NPO status , Patient's Chart, lab work & pertinent test results  Airway Mallampati: II      Dental   Pulmonary neg pulmonary ROS,          Cardiovascular hypertension, + Peripheral Vascular Disease Rhythm:Regular Rate:Normal     Neuro/Psych    GI/Hepatic hiatal hernia, GERD-  ,  Endo/Other  diabetes  Renal/GU      Musculoskeletal   Abdominal   Peds  Hematology   Anesthesia Other Findings   Reproductive/Obstetrics                          Anesthesia Physical Anesthesia Plan  ASA: III  Anesthesia Plan: General   Post-op Pain Management:    Induction: Intravenous  Airway Management Planned: LMA  Additional Equipment:   Intra-op Plan:   Post-operative Plan: Extubation in OR  Informed Consent: I have reviewed the patients History and Physical, chart, labs and discussed the procedure including the risks, benefits and alternatives for the proposed anesthesia with the patient or authorized representative who has indicated his/her understanding and acceptance.   Dental advisory given  Plan Discussed with: CRNA, Anesthesiologist and Surgeon  Anesthesia Plan Comments:        Anesthesia Quick Evaluation

## 2012-09-09 NOTE — Anesthesia Postprocedure Evaluation (Signed)
  Anesthesia Post-op Note  Patient: Javier Quinn  Procedure(s) Performed: Procedure(s): LOWER EXTREMITY ANGIOGRAM With Agioplasty Bypass Graft (Right)  Patient Location: PACU  Anesthesia Type:General  Level of Consciousness: awake  Airway and Oxygen Therapy: Patient Spontanous Breathing  Post-op Pain: mild  Post-op Assessment: Post-op Vital signs reviewed  Post-op Vital Signs: Reviewed  Complications: No apparent anesthesia complications

## 2012-09-09 NOTE — Anesthesia Procedure Notes (Signed)
Procedure Name: LMA Insertion Date/Time: 09/09/2012 2:23 PM Performed by: Gwenyth Allegra Pre-anesthesia Checklist: Patient identified, Timeout performed, Emergency Drugs available, Suction available and Patient being monitored Patient Re-evaluated:Patient Re-evaluated prior to inductionOxygen Delivery Method: Circle system utilized Preoxygenation: Pre-oxygenation with 100% oxygen Intubation Type: IV induction Ventilation: Mask ventilation without difficulty and Oral airway inserted - appropriate to patient size LMA: LMA inserted Number of attempts: 1 Placement Confirmation: positive ETCO2 and breath sounds checked- equal and bilateral Tube secured with: Tape Dental Injury: Teeth and Oropharynx as per pre-operative assessment

## 2012-09-10 LAB — CK TOTAL AND CKMB (NOT AT ARMC)
CK, MB: 2.2 ng/mL (ref 0.3–4.0)
Relative Index: INVALID (ref 0.0–2.5)
Relative Index: INVALID (ref 0.0–2.5)
Total CK: 93 U/L (ref 7–232)

## 2012-09-10 LAB — GLUCOSE, CAPILLARY: Glucose-Capillary: 185 mg/dL — ABNORMAL HIGH (ref 70–99)

## 2012-09-10 LAB — BASIC METABOLIC PANEL
BUN: 13 mg/dL (ref 6–23)
CO2: 24 mEq/L (ref 19–32)
Chloride: 103 mEq/L (ref 96–112)
GFR calc non Af Amer: >90 mL/min (ref >90)
Glucose, Bld: 141 mg/dL — ABNORMAL HIGH (ref 70–99)
Potassium: 4.2 mEq/L (ref 3.5–5.1)
Sodium: 138 mEq/L (ref 135–145)

## 2012-09-10 LAB — HEMOGLOBIN A1C: Mean Plasma Glucose: 174 mg/dL — ABNORMAL HIGH (ref <117)

## 2012-09-10 LAB — CBC
HCT: 35.2 % — ABNORMAL LOW (ref 39.0–52.0)
Hemoglobin: 12.2 g/dL — ABNORMAL LOW (ref 13.0–17.0)
RBC: 4.12 MIL/uL — ABNORMAL LOW (ref 4.22–5.81)

## 2012-09-10 LAB — TROPONIN I: Troponin I: <0.3 ng/mL (ref <0.30)

## 2012-09-10 MED ORDER — ENOXAPARIN SODIUM 60 MG/0.6ML ~~LOC~~ SOLN
50.0000 mg | SUBCUTANEOUS | Status: DC
  Administered 2012-09-10 – 2012-09-11 (×2): 50 mg via SUBCUTANEOUS
  Filled 2012-09-10 (×2): qty 0.6

## 2012-09-10 NOTE — Evaluation (Signed)
Physical Therapy Evaluation Patient Details Name: Javier Quinn MRN: [id number] DOB: 1960-03-06 Today's Date: 09/10/2012 Time: 9147-8295 PT Time Calculation (min): 20 min  PT Assessment / Plan / Recommendation History of Present Illness  Javier Quinn is a 52 y.o. ([birth year]) male s/p Right superficial femoral artery to peroneal artery bypass with ipsilateral non-reverse greater saphenous vein (Date: 06/15/12) who presents with chief complaint: R leg pain and swelling. The patient has rest pain and wounds include: none. The patient notes symptoms have worsened over the last few weeks.  The pt has some worsening anterior anesthesia and decreased swelling in both legs with extended standing.  Clinical Impression  Patient demonstrates deficits in functional mobility as indicated below. Pt will benefit from continued skilled PT to address deficits and maximize independence. Will continue to see as indicated. Pt unsure of discharge arrangements, will continue to inquire for stair negotiation if needed.      PT Assessment  Patient needs continued PT services    Follow Up Recommendations  No PT follow up    Does the patient have the potential to tolerate intense rehabilitation      Barriers to Discharge Other (comment) (living in a motel)      Equipment Recommendations  None recommended by PT    Recommendations for Other Services     Frequency Min 3X/week    Precautions / Restrictions Precautions Precautions: Fall Restrictions Weight Bearing Restrictions: No   Pertinent Vitals/Pain 2/10 pain      Mobility  Bed Mobility Bed Mobility: Supine to Sit;Sitting - Scoot to Edge of Bed Supine to Sit: 5: Supervision Sitting - Scoot to Edge of Bed: 5: Supervision Details for Bed Mobility Assistance: some increased time but no physical assist needed Transfers Transfers: Sit to Stand;Stand to Sit Sit to Stand: 6: Modified independent (Device/Increase time) Stand to Sit: 6: Modified  independent (Device/Increase time) Details for Transfer Assistance: from various surfaces including bed, chair and toilet Ambulation/Gait Ambulation/Gait Assistance: 5: Supervision Ambulation Distance (Feet): 110 Feet Assistive device: Rolling walker Ambulation/Gait Assistance Details: steady but slow Gait Pattern: Step-through pattern;Decreased stride length;Antalgic;Trunk flexed Gait velocity: decreased General Gait Details: steady with rw        PT Diagnosis: Difficulty walking;Acute pain  PT Problem List: Decreased strength;Decreased activity tolerance;Decreased balance;Decreased mobility PT Treatment Interventions: DME instruction;Gait training;Functional mobility training;Therapeutic activities;Therapeutic exercise;Balance training;Patient/family education     PT Goals(Current goals can be found in the care plan section) Acute Rehab PT Goals Patient Stated Goal: none stated PT Goal Formulation: With patient Time For Goal Achievement: 09/24/12 Potential to Achieve Goals: Good  Visit Information  Last PT Received On: 09/10/12 Assistance Needed: +1 Reason Eval/Treat Not Completed: Other (comment) (pt was moving to 2W when PT checked on pt at 1030am. Nursing) History of Present Illness: Javier Quinn is a 52 y.o. (02-15-1960) male s/p Right superficial femoral artery to peroneal artery bypass with ipsilateral non-reverse greater saphenous vein (Date: 06/15/12) who presents with chief complaint: R leg pain and swelling. The patient has rest pain and wounds include: none. The patient notes symptoms have worsened over the last few weeks.  The pt has some worsening anterior anesthesia and decreased swelling in both legs with extended standing.       Prior Functioning  Home Living Family/patient expects to be discharged to:: Unsure (living in a motel) Living Arrangements: Alone Available Help at Discharge: Friend(s) Type of Home: Other(Comment) (motel) Home Access: Level entry Home  Layout: One level Home Equipment: None Prior  Function Level of Independence: Independent Communication Communication: No difficulties Dominant Hand: Left    Cognition  Cognition Arousal/Alertness: Awake/alert Behavior During Therapy: WFL for tasks assessed/performed Overall Cognitive Status: Within Functional Limits for tasks assessed    Extremity/Trunk Assessment Upper Extremity Assessment Upper Extremity Assessment: Overall WFL for tasks assessed Lower Extremity Assessment Lower Extremity Assessment: Generalized weakness   Balance Balance Balance Assessed: Yes High Level Balance High Level Balance Activites: Side stepping;Backward walking;Direction changes;Turns High Level Balance Comments: steady with RW  End of Session PT - End of Session Equipment Utilized During Treatment: Gait belt Activity Tolerance: Patient limited by fatigue Patient left: in chair;with call bell/phone within reach Nurse Communication: Mobility status  GP     Fabio Asa 09/10/2012, 2:29 PM Charlotte Crumb, PT DPT  [phone]

## 2012-09-10 NOTE — Progress Notes (Signed)
PT Cancellation Note  Patient Details Name: Javier Quinn MRN: [id number] DOB: 05-15-1960   Cancelled Treatment:    Reason Eval/Treat Not Completed: Other (comment) (pt was moving to 2W when PT checked on pt at 1030am. Nursing wanted PT to check on pt once on 2W.  PT will try to return in pm to 2W.  If PT can't return, will return on 9/7 Sunday to evaluate pt.) Thanks.  INGOLD,Shriya Aker 09/10/2012, 12:15 PM Tymir Terral [facility] [phone] (pager)

## 2012-09-10 NOTE — Progress Notes (Signed)
Paged Dr. Imogene Burn at [phone], return call at [phone]. Notified Dr. Imogene Burn of patient's increased swelling in RLE specifically posterior knee extending to foot, tightness in right calf, and numbness at right heel and toes. DP and PT pulses remain dopplerable. Dr. Imogene Burn discussed possible need for patient to have further surgical intervention to RLE and to have patient NPO. Updated Dr. Imogene Burn on patient's HR and BP returning to his baseline. Will continue to monitor. Updated patient on POC. Dr. Imogene Burn to discuss with patient in AM.

## 2012-09-10 NOTE — Progress Notes (Addendum)
[facility] Progress Note  09/10/2012 7:13 AM 1 Day Post-Op  Subjective:  Sleeping-wakes easily-states he hasn't slept this good in a long time.  Per RN, pt started complaining of numbness and tightness in his foot this morning around 2am.  Afebrile HR 50's-80's regular 110's-160's systolic 92% RA  Filed Vitals:   09/10/12 0400  BP: 116/91  Pulse: 79  Temp: 98 F (36.7 C)  Resp: 14    Physical Exam: Incisions:  Left groin is soft without hematoma Extremities:  Right PT doppler signal is present; right calf is soft.  Pt moves his toes to command; sensory in tact on the distal, plantar surface of the foot.  CBC    Component Value Date/Time   WBC 6.0 09/10/2012 0630   RBC 4.12* 09/10/2012 0630   HGB 12.2* 09/10/2012 0630   HCT 35.2* 09/10/2012 0630   PLT 208 09/10/2012 0630   MCV 85.4 09/10/2012 0630   MCH 29.6 09/10/2012 0630   MCHC 34.7 09/10/2012 0630   RDW 13.8 09/10/2012 0630   LYMPHSABS 2.8 08/04/2012 1030   MONOABS 0.5 08/04/2012 1030   EOSABS 0.3 08/04/2012 1030   BASOSABS 0.0 08/04/2012 1030    BMET    Component Value Date/Time   NA 138 09/09/2012 1137   K 4.1 09/09/2012 1137   CL 102 09/09/2012 1137   CO2 21 09/09/2012 1137   GLUCOSE 148* 09/09/2012 1137   BUN 20 09/09/2012 1137   CREATININE 0.92 09/09/2012 1137   CALCIUM 9.9 09/09/2012 1137   GFRNONAA >90 09/09/2012 1137   GFRAA >90 09/09/2012 1137    INR    Component Value Date/Time   INR 0.93 09/09/2012 1137     Intake/Output Summary (Last 24 hours) at 09/10/12 0713 Last data filed at 09/10/12 1610  Gross per 24 hour  Intake   3740 ml  Output   1625 ml  Net   2115 ml    Assessment/Plan:  52 y.o. male is s/p:  1. Left common femoral artery cannulation under ultrasound guidance  2. Third order arterial selection  3. Left leg runoff  4. Angioplasty of distal bypass and peroneal artery  1 Day Post-Op  -considering pt was complaining of numbness in his foot this am and the extent of his PAD, pt may need  right AKA this am.  Dr. Imogene Burn will be in to assess pt this morning. -keep pt NPO -acute surgical blood loss anemia-BP tolerating -DVT prophylaxis:  None at this time as pt was high risk of bleeding post surgery and may need further operation this am. -Hold metformin through the weekend and restart on Monday after contrast media yesterday -BUN/Cr normal with good UOP   Doreatha Massed, PA-C [facility] [phone] 09/10/2012 7:13 AM  Addendum  I have independently interviewed and examined the patient, and I agree with the physician assistant's findings.  Soft right calf.  Dopplerable DP this AM (none yesterday).  Incomplete recannulation of bypass conduit, so I suspect it likely will rethrombose.  The AVF due to the angioplasty make reintervention contraindicated.  Also the lack of adequate conduit makes it impossible to jump to a lower segment.  No evidence of clinically significant AVF at this point.  I doubt yesterday's intervention will be enough to eliminate his rest pain.  We discussed briefly palliative amputation and he is going to consider proceeding.  The patient will need also Social Work's assistance, as I have some concerns that this patient is actually homeless.  I would continue observation for another night as the patient had sx bradycardia which resolved spontaneously.  I also want to observe for a clinically significant compartment syndrome from the AVF.  Likely d/c home tomorrow if no further procedures this admission.  Leonides Sake, MD [facility] [place] Office: [phone] Pager: [phone]  09/10/2012, 8:02 AM

## 2012-09-11 LAB — CBC
HCT: 35.2 % — ABNORMAL LOW (ref 39.0–52.0)
Hemoglobin: 12.1 g/dL — ABNORMAL LOW (ref 13.0–17.0)
MCHC: 34.4 g/dL (ref 30.0–36.0)
MCV: 85.4 fL (ref 78.0–100.0)

## 2012-09-11 LAB — GLUCOSE, CAPILLARY: Glucose-Capillary: 147 mg/dL — ABNORMAL HIGH (ref 70–99)

## 2012-09-11 MED ORDER — OXYCODONE HCL 5 MG PO TABS: 5.0000 mg | ORAL_TABLET | Freq: Four times a day (QID) | ORAL | Status: DC | PRN

## 2012-09-11 MED ORDER — BISACODYL 10 MG RE SUPP: 10.0000 mg | Freq: Once | RECTAL | Status: DC

## 2012-09-11 NOTE — Progress Notes (Signed)
Spoke with Dr. Imogene Burn regarding d/c plan. Patient stated that he lives in a motel from time to time.He stated  that if he doesn't get to go back to the motel that he may be able to stay with his sister for a while. Patient was provided with a list of homeless shelters per Child psychotherapist. Has medicaid, therefore, he is not eligible for the match program.Javier Quinn, Javier Quinn

## 2012-09-11 NOTE — Progress Notes (Signed)
Patient provided with [employer] (toiletries). Awaiting sister for d/c home.Javier Quinn

## 2012-09-11 NOTE — Progress Notes (Signed)
Patient's family has been to floor several times and were upset that patient is being d/c'd today. They state that the patient has no where to go and that he can not stay with them. Informed family that social worker has seen patient and provided a list of shelters for patient to go. Patient's family reviewed list and left hospital. Family returned to hospital stating that they have arranged for patient to have a room at [facility] next to hospital but was told that [facility] were not in until 0900 tomorrow. Family requesting 24 hr d/c. CM paged to assist with facilitating d/c; awaiting return call.Mamie Levers

## 2012-09-11 NOTE — Progress Notes (Signed)
Spoke to social worker/supervisor regarding family request. Patient is medically stable and does not meet requirements for placement at [facility]. Patient informed; awaiting family for d/c home.Mamie Levers

## 2012-09-11 NOTE — Progress Notes (Signed)
Weekend CSW received referral for housing issues. CSW spoke with patient regarding his d/c plans. Pt reported that his sister would be picking him up from the hospital but that he currently is staying in a hotel. CSW provided patient with shelter list for [place] and information on the [facility] housing programs through [employer]. Patient had no questions. CSW signing off unless further SW needs arise.   Samuella Bruin, MSW, LCSWA Clinical Social Worker [facility] Emergency Dept. [phone]

## 2012-09-11 NOTE — Progress Notes (Signed)
Clinical Child psychotherapist (CSW) was contacted by Charity fundraiser for a bus pass for patient to get back to the motel once he was D/C. CSW attempted to bring patient bus pass however patient was already D/C when CSW went to the room. CSW signing off.  Jetta Lout, LCSWA Weekend CSW [phone]

## 2012-09-11 NOTE — Progress Notes (Signed)
Discharge instructions provided. IVs d/c'd with catheter intact. Patient's family at bedside; arranged for another night at motel. Brother-in-law states that they made arragements for [facility] tomorrow.Javier Quinn

## 2012-09-11 NOTE — Progress Notes (Signed)
   CARE MANAGEMENT NOTE 09/11/2012  Patient:  Javier Quinn, Javier Quinn   Account Number:  [id number]  Date Initiated:  09/11/2012  Documentation initiated by:  [facility]  Subjective/Objective Assessment:   adm: R leg pain and swelling     Action/Plan:   Anticipated DC Date:  09/11/2012   Anticipated DC Plan:  HOME/SELF CARE      DC Planning Services  CM consult      Choice offered to / List presented to:             Status of service:  Completed, signed off Medicare Important Message given?   (If response is "NO", the following Medicare IM given date fields will be blank) Date Medicare IM given:   Date Additional Medicare IM given:    Discharge Disposition:  HOME/SELF CARE  Per UR Regulation:    If discussed at Long Length of Stay Meetings, dates discussed:    Comments:  09/11/2012 09:50 CM met with pt in room who states he usually lives in [place]  but is going to go home with sister.  Pt states he sees Dr Laural Benes at the [facility].  He is a Medicaid pt and his sister helps him out with the cost of his medications.  [facility] handout was given pt with contact numbers.  The unit is giving pt a "shoebox" with toiletries.  Pt has no difficulty ambulating and no PT/OT orders.  No other needs were communicated at this time.  Freddy Jaksch, BSN, CM [phone]

## 2012-09-11 NOTE — Discharge Summary (Deleted)
Duplication of discharge summary.

## 2012-09-11 NOTE — Progress Notes (Addendum)
[facility] Progress Note  09/11/2012 7:09 AM 2 Days Post-Op  Subjective:  States his foot feels much better than before going to the office on Friday.  States he lives in a motel.  Afebrile VSS 97% RA  Filed Vitals:   09/11/12 0510  BP: 143/90  Pulse: 62  Temp: 97.6 F (36.4 C)  Resp: 16    Physical Exam: Incisions:  Left groin is soft without hematoma Extremities:  Right foot is warm; right calf continues to be soft.  Motor and sensory are in tact.  CBC    Component Value Date/Time   WBC 5.6 09/11/2012 0430   RBC 4.12* 09/11/2012 0430   HGB 12.1* 09/11/2012 0430   HCT 35.2* 09/11/2012 0430   PLT 205 09/11/2012 0430   MCV 85.4 09/11/2012 0430   MCH 29.4 09/11/2012 0430   MCHC 34.4 09/11/2012 0430   RDW 13.5 09/11/2012 0430   LYMPHSABS 2.8 08/04/2012 1030   MONOABS 0.5 08/04/2012 1030   EOSABS 0.3 08/04/2012 1030   BASOSABS 0.0 08/04/2012 1030    BMET    Component Value Date/Time   NA 138 09/10/2012 0630   K 4.2 09/10/2012 0630   CL 103 09/10/2012 0630   CO2 24 09/10/2012 0630   GLUCOSE 141* 09/10/2012 0630   BUN 13 09/10/2012 0630   CREATININE 0.79 09/10/2012 0630   CALCIUM 8.7 09/10/2012 0630   GFRNONAA >90 09/10/2012 0630   GFRAA >90 09/10/2012 0630    INR    Component Value Date/Time   INR 0.93 09/09/2012 1137     Intake/Output Summary (Last 24 hours) at 09/11/12 0709 Last data filed at 09/11/12 1610  Gross per 24 hour  Intake    800 ml  Output   1025 ml  Net   -225 ml     Assessment/Plan:  52 y.o. male is s/p:  1. Left common femoral artery cannulation under ultrasound guidance  2. Third order arterial selection  3. Left leg runoff  4. Angioplasty of distal bypass and peroneal artery   2 Days Post-Op   -pt's symptoms have improved as motor and sensory are in tact  -pt stable for discharge.  Social work consult ordered yesterday for discharge planning-will re-order today. -f/u with Dr. Imogene Burn in 2 weeks. -DVT prophylaxis:  Lovenox   Doreatha Massed,  PA-C [facility] [phone] 09/11/2012 7:09 AM  Addendum  I have independently interviewed and examined the patient, and I agree with the physician assistant's findings.  Warm right foot without worse calf swelling.  Pt ready to d/c from a medical viewpoint.  He may have social issues to limit his discharge options.  Again I'm not certain if this intervention will be adequate to keep his pain well controlled.  Palliative amputation may be needed in the future.  Leonides Sake, MD [facility] [employer] Office: [phone] Pager: [phone]  09/11/2012, 8:21 AM

## 2012-09-11 NOTE — Discharge Summary (Signed)
Physician Discharge Summary  Patient ID: Javier Quinn MRN: [id number] DOB/AGE: 09/04/1960 52 y.o.  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 Diabetes mellitus without complication   . Hyperlipidemia   . Tobacco dependence   . GERD (gastroesophageal reflux disease)   . H/O hiatal hernia   . Peripheral vascular disease   . Hypertension     See's Dr Eden Emms States    Procedures: Angioplasty of right SFA to peroneal bypass  Discharged Condition: stable  Hospital Course:  Javier Quinn is 52 y.o. male s/p R SFA to peroneal bypass with ipsilateral non-reversed GSV presented in clinic with arterial duplex of R leg demonstrating no tibial artery flow.  The patient was brought back to the [facility] OR and a right leg angiogram demonstrated occlusion of the distal vein graft.  This vein graft was marginal at the time of prior surgery, so I am not absolutely surprised.  I was able to cross the conduit and angioplasty it.  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  The patient was observed for two night to make certain he did not develop any right calf compartment syndrome due to the arteriovenous fistula.  During this admission, this patient has had improvement in perfusion of his right foot, which is now warm with a dopplerable dorsalis pedis signal.  He has not develop any significantly greatly right calf swelling.  Also during this admission, he transiently developed asymptomatic bradycardiac which was self-limiting.    At this point, I have discussed with the patient that I doubt he is a candidate for further surgical revascularization.  His distal target was severely diseased and it was minor miracle that the  bypass could be completed.  I would let his arteriovenous fistula heal and make another attempt at endovascular therapy prior to considering amputation.  We discussed that at some point, he likely will need to consider palliative amputation.  Unfortunately, his underlying co-morbidities have been uncontrolled for too long resulting in severe tibial artery disease.  Additionally, I am receiving letters at my office documenting this patient's periods of homelessness.  Ultimately, his social issues may undermine any attempts at medical management.  Social worker attempted to address these issues with him during this admission.  Significant Diagnostic Studies: CBC    Component Value Date/Time   WBC 5.6 09/11/2012 0430   RBC 4.12* 09/11/2012 0430   HGB 12.1* 09/11/2012 0430   HCT 35.2* 09/11/2012 0430   PLT 205 09/11/2012 0430   MCV 85.4 09/11/2012 0430   MCH 29.4 09/11/2012 0430   MCHC 34.4 09/11/2012 0430   RDW 13.5 09/11/2012 0430   LYMPHSABS 2.8 08/04/2012 1030   MONOABS 0.5 08/04/2012 1030   EOSABS 0.3 08/04/2012 1030   BASOSABS 0.0 08/04/2012 1030    BMET    Component Value Date/Time   NA 138 09/10/2012 0630   K 4.2 09/10/2012 0630   CL 103 09/10/2012 0630   CO2 24 09/10/2012 0630   GLUCOSE 141* 09/10/2012 0630   BUN 13 09/10/2012 0630   CREATININE 0.79 09/10/2012 0630   CALCIUM 8.7 09/10/2012 0630   GFRNONAA >90 09/10/2012 0630   GFRAA >90 09/10/2012 0630    COAG Lab Results  Component Value Date   INR 0.93 09/09/2012  INR 0.94 06/08/2012   No results found for this basename: PTT    Disposition: 01-Home or Self Care  Discharge Orders   Future Appointments Provider Department Dept Phone   02/10/2013 12:00 PM [facility] Lab 2 [facility] [phone]   02/10/2013 12:30 PM [facility] Lab 2 [facility] [phone]   02/10/2013 1:00 PM Fransisco Hertz, MD [facility] [phone]   Future Orders Complete By Expires   Call MD for:   redness, tenderness, or signs of infection (pain, swelling, bleeding, redness, odor or green/yellow discharge around incision site)  As directed    Call MD for:  redness, tenderness, or signs of infection (pain, swelling, bleeding, redness, odor or green/yellow discharge around incision site)  As directed    Call MD for:  severe or increased pain, loss or decreased feeling  in affected limb(s)  As directed    Call MD for:  severe or increased pain, loss or decreased feeling  in affected limb(s)  As directed    Call MD for:  temperature >100.5  As directed    Call MD for:  temperature >100.5  As directed    Discharge wound care:  As directed    Comments:     Shower daily with soap and water starting 09/12/12   Driving Restrictions  As directed    Comments:     No driving for 24 hours and while taking pain medication.   Driving Restrictions  As directed    Comments:     No driving while on narcotics   Increase activity slowly  As directed    Comments:     Walk with assistance use walker or cane as needed   Lifting restrictions  As directed    Comments:     No lifting for 2 weeks   Lifting restrictions  As directed    Comments:     No lifting while difficulty ambulating   Resume previous diet  As directed    Resume previous diet  As directed    Walk with assistance  As directed        Medication List         acetaminophen 325 MG tablet  Commonly known as:  TYLENOL  Take 650 mg by mouth every 6 (six) hours as needed for pain.     albuterol 108 (90 BASE) MCG/ACT inhaler  Commonly known as:  PROVENTIL HFA;VENTOLIN HFA  Inhale 2 puffs into the lungs every 4 (four) hours as needed for wheezing.     clopidogrel 75 MG tablet  Commonly known as:  PLAVIX  Take 1 tablet (75 mg total) by mouth daily with breakfast.     ibuprofen 200 MG tablet  Commonly known as:  ADVIL,MOTRIN  Take 400 mg by mouth every 6 (six) hours as needed for pain.     lisinopril-hydrochlorothiazide 20-12.5 MG  per tablet  Commonly known as:  PRINZIDE,ZESTORETIC  Take 1 tablet by mouth daily.     metFORMIN 500 MG 24 hr tablet  Commonly known as:  GLUCOPHAGE XR  Take 2 tablets (1,000 mg total) by mouth daily with breakfast.     oxyCODONE 5 MG immediate release tablet  Commonly known as:  Oxy IR/ROXICODONE  Take 1 tablet (5 mg total) by mouth every 6 (six) hours as needed.     pravastatin 80 MG tablet  Commonly known as:  PRAVACHOL  Take 1 tablet (80 mg total) by mouth daily.  Follow-up Information   Follow up with Nilda Simmer, MD In 2 weeks. (Office will call you to arrange your appt (sent))    Specialty:  Vascular Surgery   Contact information:   [address] [place] [address] [phone]       Follow up with [facility] (For follow up appt please call after discharge)    Contact information:   [address] [place], [place] [address] [phone]      Signed: Nilda Simmer 09/11/2012, 11:07 AM

## 2012-09-12 NOTE — Progress Notes (Signed)
UR Completed.  Diva Lemberger Jane [phone] 09/12/2012  

## 2012-09-20 ENCOUNTER — Emergency Department (HOSPITAL_COMMUNITY)
Admission: EM | Admit: 2012-09-20 | Discharge: 2012-09-20 | Disposition: A | Discharge status: Home / Self Care | Payer: Medicaid Other | Attending: Emergency Medicine | Admitting: Emergency Medicine

## 2012-09-20 ENCOUNTER — Encounter (HOSPITAL_COMMUNITY): Payer: Self-pay | Admitting: *Deleted

## 2012-09-20 DIAGNOSIS — M79609 Pain in unspecified limb: Secondary | ICD-10-CM

## 2012-09-20 DIAGNOSIS — Z9889 Other specified postprocedural states: Secondary | ICD-10-CM | POA: Insufficient documentation

## 2012-09-20 DIAGNOSIS — E119 Type 2 diabetes mellitus without complications: Secondary | ICD-10-CM | POA: Insufficient documentation

## 2012-09-20 DIAGNOSIS — M7989 Other specified soft tissue disorders: Secondary | ICD-10-CM

## 2012-09-20 DIAGNOSIS — Z87891 Personal history of nicotine dependence: Secondary | ICD-10-CM | POA: Insufficient documentation

## 2012-09-20 DIAGNOSIS — Z79899 Other long term (current) drug therapy: Secondary | ICD-10-CM | POA: Insufficient documentation

## 2012-09-20 DIAGNOSIS — Z7902 Long term (current) use of antithrombotics/antiplatelets: Secondary | ICD-10-CM | POA: Insufficient documentation

## 2012-09-20 DIAGNOSIS — Z8719 Personal history of other diseases of the digestive system: Secondary | ICD-10-CM | POA: Insufficient documentation

## 2012-09-20 DIAGNOSIS — E785 Hyperlipidemia, unspecified: Secondary | ICD-10-CM | POA: Insufficient documentation

## 2012-09-20 DIAGNOSIS — I10 Essential (primary) hypertension: Secondary | ICD-10-CM | POA: Insufficient documentation

## 2012-09-20 LAB — CBC WITH DIFFERENTIAL/PLATELET
Basophils Absolute: 0 10*3/uL (ref 0.0–0.1)
Eosinophils Absolute: 0.2 10*3/uL (ref 0.0–0.7)
Eosinophils Relative: 2 % (ref 0–5)
HCT: 38.3 % — ABNORMAL LOW (ref 39.0–52.0)
Lymphocytes Relative: 39 % (ref 12–46)
MCH: 29.4 pg (ref 26.0–34.0)
MCV: 84.7 fL (ref 78.0–100.0)
Monocytes Absolute: 0.7 10*3/uL (ref 0.1–1.0)
RDW: 13.5 % (ref 11.5–15.5)
WBC: 7.3 10*3/uL (ref 4.0–10.5)

## 2012-09-20 LAB — BASIC METABOLIC PANEL
CO2: 23 mEq/L (ref 19–32)
Calcium: 10 mg/dL (ref 8.4–10.5)
Creatinine, Ser: 0.84 mg/dL (ref 0.50–1.35)
Glucose, Bld: 156 mg/dL — ABNORMAL HIGH (ref 70–99)

## 2012-09-20 MED ORDER — MORPHINE SULFATE 4 MG/ML IJ SOLN
4.0000 mg | Freq: Once | INTRAMUSCULAR | Status: AC
  Administered 2012-09-20: 4 mg via INTRAVENOUS
  Filled 2012-09-20: qty 1

## 2012-09-20 MED ORDER — ONDANSETRON HCL 4 MG/2ML IJ SOLN
4.0000 mg | Freq: Once | INTRAMUSCULAR | Status: AC
  Administered 2012-09-20: 4 mg via INTRAVENOUS
  Filled 2012-09-20: qty 2

## 2012-09-20 MED ORDER — OXYCODONE HCL 5 MG PO TABS: 10.0000 mg | ORAL_TABLET | ORAL | Status: DC | PRN

## 2012-09-20 NOTE — ED Notes (Signed)
Pt reports hx of bypass surgery right leg 6 months ago. Started having right leg pain again, has been seen at vascular dr on 9/5 and told he may need amputation of leg due to poor blood flow. Having increase in pain and unable to sleep.

## 2012-09-20 NOTE — ED Provider Notes (Signed)
CSN: [id number]     Arrival date & time 09/20/12  5621 History   First MD Initiated Contact with Patient 09/20/12 [phone]     Chief Complaint  Patient presents with  . Leg Pain   (Consider location/radiation/quality/duration/timing/severity/associated sxs/prior Treatment) HPI Comments: Patient is a 52 year old male with a past medical history of femoral-peroneal bypass graft 3 months ago, diabetes, hyperlipidemia, and hypertension who presents with worsening right lower leg pain and swelling. Patient reports symptoms have been constant since his surgery 3 months ago and have become worse in the past few days. Patient reports throbbing and severe pain without radiation. Patient reports worsening edema. He reports being unable to sleep due to pain. Patient is seen by Dr. Imogene Burn of Vascular surgery. No aggravating/alleviating factors. No associated symptoms.    Past Medical History  Diagnosis Date  . Diabetes mellitus without complication   . Hyperlipidemia   . Tobacco dependence   . GERD (gastroesophageal reflux disease)   . H/O hiatal hernia   . Peripheral vascular disease   . Hypertension     See's Dr Riki Sheer    Past Surgical History  Procedure Laterality Date  . Bypass graft femoral-peroneal Right 06/15/2012    Procedure: BYPASS GRAFT [place];  Surgeon: Fransisco Hertz, MD;  Location: [facility] OR;  Service: Vascular;  Laterality: Right;  using right non-reversed saphenous vein  . Lower extremity angiogram Right 06/15/2012    Procedure: LOWER EXTREMITY ANGIOGRAM;  Surgeon: Fransisco Hertz, MD;  Location: [facility] OR;  Service: Vascular;  Laterality: Right;   Family History  Problem Relation Age of Onset  . Cancer Mother     pancreas  . Cancer Father    History  Substance Use Topics  . Smoking status: Former Smoker -- 0.50 packs/day for 20 years    Types: Cigarettes    Quit date: 04/11/2012  . Smokeless tobacco: Former Neurosurgeon  . Alcohol Use: No     Comment: occ    Review of Systems   Cardiovascular: Positive for leg swelling.  Musculoskeletal: Positive for myalgias.  All other systems reviewed and are negative.    Allergies  Review of patient's allergies indicates no known allergies.  Home Medications   Current Outpatient Rx  Name  Route  Sig  Dispense  Refill  . acetaminophen (TYLENOL) 325 MG tablet   Oral   Take 650 mg by mouth every 6 (six) hours as needed for pain.         [name] albuterol (PROVENTIL HFA;VENTOLIN HFA) 108 (90 BASE) MCG/ACT inhaler   Inhalation   Inhale 2 puffs into the lungs every 4 (four) hours as needed for wheezing.   1 each   0   . clopidogrel (PLAVIX) 75 MG tablet   Oral   Take 1 tablet (75 mg total) by mouth daily with breakfast.   30 tablet   6   . ibuprofen (ADVIL,MOTRIN) 200 MG tablet   Oral   Take 400 mg by mouth every 6 (six) hours as needed for pain.         [name] lisinopril-hydrochlorothiazide (PRINZIDE,ZESTORETIC) 20-12.5 MG per tablet   Oral   Take 1 tablet by mouth daily.   30 tablet   4   . metFORMIN (GLUCOPHAGE XR) 500 MG 24 hr tablet   Oral   Take 2 tablets (1,000 mg total) by mouth daily with breakfast.         . oxyCODONE (OXY IR/ROXICODONE) 5 MG immediate release tablet  Oral   Take 1 tablet (5 mg total) by mouth every 6 (six) hours as needed.   50 tablet   0   . pravastatin (PRAVACHOL) 80 MG tablet   Oral   Take 1 tablet (80 mg total) by mouth daily.   30 tablet   4    BP 189/102  Pulse 87  Temp(Src) 98.1 F (36.7 C) (Oral)  Resp 24  Ht 6' (1.829 m)  Wt 228 lb (103.42 kg)  BMI 30.92 kg/m2  SpO2 93% Physical Exam  Nursing note and vitals reviewed. Constitutional: He is oriented to person, place, and time. He appears well-developed and well-nourished. No distress.  HENT:  Head: Normocephalic and atraumatic.  Eyes: Conjunctivae and EOM are normal.  Neck: Normal range of motion.  Cardiovascular: Normal rate, regular rhythm and intact distal pulses.  Exam reveals no gallop and no  friction rub.   No murmur heard. Distal pulses noted with doppler.   Pulmonary/Chest: Effort normal and breath sounds normal. He has no wheezes. He has no rales. He exhibits no tenderness.  Abdominal: Soft. He exhibits no distension. There is no tenderness. There is no rebound and no guarding.  Musculoskeletal: Normal range of motion.  Right lower leg edema and tenderness to palpation of anterior tibia, calf and popliteal region. Healed scar on medial side lower leg, distal to right knee.   Neurological: He is alert and oriented to person, place, and time. Coordination normal.  Speech is goal-oriented. Moves limbs without ataxia.   Skin: Skin is warm and dry.  Psychiatric: He has a normal mood and affect. His behavior is normal.    ED Course  Procedures (including critical care time) Labs Review Labs Reviewed  CBC WITH DIFFERENTIAL - Abnormal; Notable for the following:    HCT 38.3 (*)    All other components within normal limits  BASIC METABOLIC PANEL - Abnormal; Notable for the following:    Glucose, Bld 156 (*)    All other components within normal limits   Imaging Review No results found.  MDM   1. Bilateral swelling of feet   2. Pain in limb   3. Leg swelling   4. Lower leg pain, right     10:15 AM Labs pending. Patient's right lower leg  Has no acute neurovascular compromise at this time. Pulses are noted with doppler, sensation intact and patient has sufficient capillary refill. Dr. Bebe Shaggy will speak with Dr. Imogene Burn of Vascular Surgery for further management. Patient will have pain medication here.   1:11 PM Patient has no DVT. Patient able to ambulate without difficulty. Patient will be discharged with pain medication and instructed to follow up in the Vascular surgery office. Vitals stable and patient afebrile.     [name], PA-C 09/20/12 1321

## 2012-09-20 NOTE — ED Notes (Signed)
Patient taken to procedure.

## 2012-09-20 NOTE — Progress Notes (Signed)
Right lower extremity venous duplex completed.  Right:  No evidence of DVT, superficial thrombosis, or Baker's cyst.  Left:  Negative for DVT in the common femoral vein.  

## 2012-09-20 NOTE — ED Provider Notes (Signed)
D/w dr Myra Gianotti Pt has distal pulses by bedside doppler (and both feet are warm without signs of ischemia) Will order DVT study and check labs If negative can f/u later this week   Joya Gaskins, MD 09/20/12 1028

## 2012-09-20 NOTE — ED Notes (Signed)
Patient transported to Ultrasound 

## 2012-09-21 NOTE — ED Provider Notes (Signed)
Medical screening examination/treatment/procedure(s) were conducted as a shared visit with non-physician practitioner(s) and myself.  I personally evaluated the patient during the encounter   Joya Gaskins, MD 09/21/12 1144

## 2012-09-22 ENCOUNTER — Encounter (HOSPITAL_COMMUNITY): Payer: Self-pay | Admitting: Vascular Surgery

## 2012-09-23 ENCOUNTER — Encounter: Payer: Self-pay | Admitting: Vascular Surgery

## 2012-09-23 ENCOUNTER — Other Ambulatory Visit: Payer: Self-pay | Admitting: *Deleted

## 2012-09-23 ENCOUNTER — Encounter: Payer: Self-pay | Admitting: *Deleted

## 2012-09-23 ENCOUNTER — Encounter: Payer: Medicaid Other | Admitting: Vascular Surgery

## 2012-09-23 ENCOUNTER — Ambulatory Visit (INDEPENDENT_AMBULATORY_CARE_PROVIDER_SITE_OTHER): Payer: Medicaid Other | Admitting: Vascular Surgery

## 2012-09-23 VITALS — BP 187/11 | HR 64 | Temp 96.9°F | Resp 16 | Ht 72.0 in | Wt 232.0 lb

## 2012-09-23 DIAGNOSIS — I739 Peripheral vascular disease, unspecified: Secondary | ICD-10-CM

## 2012-09-23 DIAGNOSIS — M79609 Pain in unspecified limb: Secondary | ICD-10-CM

## 2012-09-23 DIAGNOSIS — I999 Unspecified disorder of circulatory system: Secondary | ICD-10-CM

## 2012-09-23 DIAGNOSIS — I998 Other disorder of circulatory system: Secondary | ICD-10-CM

## 2012-09-23 NOTE — Progress Notes (Signed)
[facility] [place]  Referred by:  Olugbemiga Jegede, MD [address] [place], [place] [address]    History of Present Illness  Javier Quinn is a 52 y.o. (10/10/1960) male who presents with chief complaint: seveer right leg pain.  s/p Right superficial femoral artery to peroneal artery bypass with ipsilateral non-reverse greater saphenous vein (Date: 06/15/12) who presents with chief complaint: R leg pain and swelling.   He under went angioplasty of distal bypass and peroneal artery on 09/09/2012 which showed distal by pass occlusion.  At this point he is in severe pain and has no other revascularization options.  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   Past Medical History  Diagnosis Date  . Diabetes mellitus without complication   . Hyperlipidemia   . Tobacco dependence   . GERD (gastroesophageal reflux disease)   . H/O hiatal hernia   . Peripheral vascular disease   . Hypertension     See's Dr Nishan States     Past Surgical History  Procedure Laterality Date  . Bypass graft femoral-peroneal Right 06/15/2012    Procedure: BYPASS GRAFT FEMORAL-PERONEAL;  Surgeon: Brian L Chen, MD;  Location: [facility] OR;  Service: Vascular;  Laterality: Right;  using right non-reversed saphenous vein  . Lower extremity angiogram Right 06/15/2012    Procedure: LOWER EXTREMITY ANGIOGRAM;  Surgeon: Brian L Chen, MD;  Location: [facility] OR;  Service: Vascular;  Laterality: Right;  . Lower extremity angiogram Right 09/09/2012    Procedure: LOWER EXTREMITY ANGIOGRAM With Agioplasty Bypass Graft;  Surgeon: Brian L Chen, MD;  Location: [facility] OR;  Service: Vascular;  Laterality: Right;    History   Social History  . Marital Status: Single    Spouse Name: N/A    Number of Children: N/A  . Years of Education: N/A   Occupational History  . Not on file.   Social History Main Topics  . Smoking status: Former Smoker -- 0.50 packs/day for 20 years    Types: Cigarettes    Quit date:  04/11/2012  . Smokeless tobacco: Former User  . Alcohol Use: No     Comment: occ  . Drug Use: No  . Sexual Activity: Not on file   Other Topics Concern  . Not on file   Social History Narrative  . No narrative on file    Family History  Problem Relation Age of Onset  . Cancer Mother     pancreas  . Cancer Father       Current Outpatient Prescriptions on File Prior to Visit  Medication Sig Dispense Refill  . clopidogrel (PLAVIX) 75 MG tablet Take 1 tablet (75 mg total) by mouth daily with breakfast.  30 tablet  6  . insulin detemir (LEVEMIR) 100 UNIT/ML injection Inject 10 Units into the skin at bedtime.      . lisinopril-hydrochlorothiazide (PRINZIDE,ZESTORETIC) 20-12.5 MG per tablet Take 1 tablet by mouth daily.  30 tablet  4  . metFORMIN (GLUCOPHAGE XR) 500 MG 24 hr tablet Take 2 tablets (1,000 mg total) by mouth daily with breakfast.      . pravastatin (PRAVACHOL) 80 MG tablet Take 1 tablet (80 mg total) by mouth daily.  30 tablet  4  . albuterol (PROVENTIL HFA;VENTOLIN HFA) 108 (90 BASE) MCG/ACT inhaler Inhale 2 puffs into the lungs every 4 (four) hours as needed for wheezing.  1 each  0  . oxyCODONE (OXY IR/ROXICODONE) 5 MG immediate release tablet Take 1 tablet (5   mg total) by mouth every 6 (six) hours as needed.  50 tablet  0  . oxyCODONE (ROXICODONE) 5 MG immediate release tablet Take 2 tablets (10 mg total) by mouth every 4 (four) hours as needed for pain.  30 tablet  0   No current facility-administered medications on file prior to visit.    Allergies  Allergen Reactions  . Codeine Other (See Comments)    Causes constipation      REVIEW OF SYSTEMS:  (Positives checked otherwise negative)  CARDIOVASCULAR:  [] chest pain, [] chest pressure, [] palpitations, [] shortness of breath when laying flat, [] shortness of breath with exertion,  [x] pain in feet when walking, x[] pain in feet when laying flat, [] history of blood clot in veins (DVT), [] history of  phlebitis, [x] swelling in legs, [] varicose veins  PULMONARY:  [] productive cough, [x] asthma, [] wheezing  NEUROLOGIC:  [] weakness in arms or legs, [] numbness in arms or legs, [] difficulty speaking or slurred speech, [] temporary loss of vision in one eye, [] dizziness  HEMATOLOGIC:  [] bleeding problems, [] problems with blood clotting too easily  MUSCULOSKEL:  [x] joint pain, [] joint swelling  GASTROINTEST:  [] vomiting blood, [] blood in stool     GENITOURINARY:  [] burning with urination, [] blood in urine  PSYCHIATRIC:  [] history of major depression  INTEGUMENTARY:  [] rashes, [] ulcers  CONSTITUTIONAL:  [] fever, [] chills  For VQI Use Only    PRE-ADM LIVING: Home  AMB STATUS: Ambulatory  CAD Sx: None  PRIOR CHF: None  STRESS TEST: [x ] No, [ ] Normal, [ ] + ischemia, [ ] + MI, [ ] Both   Physical Examination  Filed Vitals:   09/23/12 1158  BP: 187/11  Pulse: 64  Temp: 96.9 F (36.1 C)  TempSrc: Oral  Resp: 16  Height: 6' (1.829 m)  Weight: 232 lb (105.235 kg)  SpO2: 98%    Body mass index is 31.46 kg/(m^2). General: A&O x 3, WD, Obese,  Pulmonary: Sym exp, good air movt, CTAB, no rales, rhonchi, & wheezing  Cardiac: RRR, Nl S1, S2, no Murmurs, rubs or gallops  Vascular:  Vessel  Right  Left   Radial  Palpable  Palpable   Brachial  Palpable  Palpable   Carotid  Palpable, without bruit  Palpable, without bruit   Aorta  Not palpable  N/A   Femoral  Palpable  Palpable   Popliteal  Not palpable  Not palpable   PT  Not Palpable  NotPalpable   DP  Not Palpable  Not Palpable    Gastrointestinal: soft, NTND, -G/R, - HSM, - masses, - CVAT B   Musculoskeletal: M/S 5/5 throughout except right foot DF/PF 4/5, Extremities without ischemic changes , R foot is relatively warm , R calf swelling  Neurologic: Pain and light touch intact in extremities including R foot, Motor exam as listed above   Non-Invasive Vascular Imaging  RLE Arterial Duplex  (Date: 09/09/2012)  Patent graft  Occluded outflow  Medical Decision Making  Javier Quinn is a 52 y.o. male who presents with: sever left leg pain. Due to the outflow occlusion and severe pain in the left LE he will be scheduled for right AKA wed. 09/28/2012  Javier Quinn PA-C [facility] Office: [phone]   09/23/2012, 12:45 PM  Addendum  I have independently interviewed and examined the patient, and I agree   with the physician assistant's findings.  As I discussed with the patient previously, his only option for further pain control at this point in R AKA, which he agreed to at this time.  I discussed in depth the nature of right above-the-knee amputation with the patient, including risks, benefits, and alternatives.  The patient is aware that the risks of right above-the-knee amputation include but are not limited to: bleeding, infection, myocardial infarction, stroke, death, failure to heal amputation wound, and possible need for more proximal amputation.  The patient is aware of the risks and agrees proceed forward with the procedure.  Brian Chen, MD [facility] [place] Office: [phone] Pager: [phone]  09/23/2012, 2:02 PM   

## 2012-09-26 ENCOUNTER — Encounter (HOSPITAL_COMMUNITY): Payer: Self-pay

## 2012-09-27 ENCOUNTER — Encounter (HOSPITAL_COMMUNITY): Payer: Self-pay

## 2012-09-27 ENCOUNTER — Encounter (HOSPITAL_COMMUNITY)
Admission: RE | Admit: 2012-09-27 | Discharge: 2012-09-27 | Disposition: A | Discharge status: Home / Self Care | Payer: Medicaid Other | Source: Ambulatory Visit | Attending: Vascular Surgery | Admitting: Vascular Surgery

## 2012-09-27 ENCOUNTER — Telehealth: Payer: Self-pay | Admitting: Family Medicine

## 2012-09-27 DIAGNOSIS — Z01818 Encounter for other preprocedural examination: Secondary | ICD-10-CM | POA: Insufficient documentation

## 2012-09-27 DIAGNOSIS — Z01812 Encounter for preprocedural laboratory examination: Secondary | ICD-10-CM | POA: Insufficient documentation

## 2012-09-27 LAB — CBC
Hemoglobin: 13.8 g/dL (ref 13.0–17.0)
MCHC: 35.4 g/dL (ref 30.0–36.0)
Platelets: 253 10*3/uL (ref 150–400)

## 2012-09-27 LAB — APTT: aPTT: 31 seconds (ref 24–37)

## 2012-09-27 LAB — URINALYSIS, ROUTINE W REFLEX MICROSCOPIC
Glucose, UA: NEGATIVE mg/dL
Hgb urine dipstick: NEGATIVE
Leukocytes, UA: NEGATIVE
Specific Gravity, Urine: 1.019 (ref 1.005–1.030)
pH: 5 (ref 5.0–8.0)

## 2012-09-27 LAB — COMPREHENSIVE METABOLIC PANEL
ALT: 30 U/L (ref 0–53)
AST: 32 U/L (ref 0–37)
Albumin: 4.2 g/dL (ref 3.5–5.2)
Alkaline Phosphatase: 101 U/L (ref 39–117)
GFR calc Af Amer: >90 mL/min (ref >90)
Glucose, Bld: 131 mg/dL — ABNORMAL HIGH (ref 70–99)
Potassium: 4.3 mEq/L (ref 3.5–5.1)
Sodium: 136 mEq/L (ref 135–145)
Total Protein: 7.4 g/dL (ref 6.0–8.3)

## 2012-09-27 MED ORDER — SODIUM CHLORIDE 0.9 % IV SOLN: INTRAVENOUS | Status: DC

## 2012-09-27 MED ORDER — DEXTROSE 5 % IV SOLN
1.5000 g | INTRAVENOUS | Status: AC
  Administered 2012-09-28: 1.5 g via INTRAVENOUS
  Filled 2012-09-27: qty 1.5

## 2012-09-27 NOTE — Telephone Encounter (Signed)
We can increase the blood pressure medicine to lisinopril 40 mg and hydrochlorothiazide 25 mg by mouth daily. But since patient is having accelerated hypertension preop, I think IV blood pressure control is ideal especially if patient is n.p.o. for surgery. I will suggest nitro drip or other available intravenous blood pressure control

## 2012-09-27 NOTE — Pre-Procedure Instructions (Signed)
Jamair Cato Flenner  09/27/2012   Your procedure is scheduled on:  September 24  Report to [facility] Entrance "A" [address] at [place] AM.  Call this number if you have problems the morning of surgery: [phone]   Remember:   Do not eat food or drink liquids after midnight.   Take these medicines the morning of surgery with A SIP OF WATER: Albuterol (if needed), Oxycodone (if needed)   Do not wear jewelry, make-up or nail polish.  Do not wear lotions, powders, or perfumes. You may wear deodorant.  Do not shave 48 hours prior to surgery. Men may shave face and neck.  Do not bring valuables to the hospital.  [facility] is not responsible                   for any belongings or valuables.  Contacts, dentures or bridgework may not be worn into surgery.  Leave suitcase in the car. After surgery it may be brought to your room.  For patients admitted to the hospital, checkout time is 11:00 AM the day of  discharge.   Special Instructions: Shower using CHG 2 nights before surgery and the night before surgery.  If you shower the day of surgery use CHG.  Use special wash - you have one bottle of CHG for all showers.  You should use approximately 1/3 of the bottle for each shower.   Please read over the following fact sheets that you were given: Pain Booklet, Coughing and Deep Breathing and Surgical Site Infection Prevention

## 2012-09-27 NOTE — Progress Notes (Signed)
Anesthesia PAT Evaluation: Patient is a 52 year old male scheduled for right AKA on 09/28/12 by Dr. Imogene Burn.  His PAT visit was on 09/27/12.    I spoke with patient due to elevated BP at his PAT appointment--186/126-172/118.  Patient reported that he had taken his lisinopril/HCTZ around 0500 this morning.  He denies visual changes, unilateral weakness, chest pain, SOB, headaches.  He feels at his baseline.  He has had significant pain in his RLE, currently rated at 5/10.  Exam shows heart RRR, no significant murmur noted.  Lungs clear. BLE edema, 1+ R > L.    History includes PAD s/p right FPBG 06/15/12, recent former smoker, hypertension, diabetes mellitus type 2, hyperlipidemia, GERD, hiatal hernia. BMI 31.69. OSA screening score was 5. PCP is Dr. Standley Dakins at [facility] [phone]). Cardiologist Dr. Eden Emms, last visit on 08/22/12.   Nuclear stress test on 05/26/12 showed:  Probable normal perfusion and mild soft tissue attenuation.  LV Ejection Fraction: 50%. LV Wall Motion: Overall normal wall thickening. LVEF appears greater than calculated. By notes, Dr. Eden Emms felt it was a "Normal myovue study with no evidence of ischemia or infarction."   EKG on 09/09/12 showed NSR, lateral 05/12/12 showed NB, possible inferior infarct (age undermined), lateral T wave abnormality, consider ischemia.   Chest x-ray on 08/04/2012 to no acute cardiopulmonary abnormalities.   Preoperative labs noted. Cr 0.83, glucose 131. CBC, PT/PTT WNL.   His EKG is abnormal, but patient had a recent stress test showing no ischemia/infarct.   Currently, he is only on lisinopril 20/HCTZ 12.5 mg Q AM. I notified Karen at Dr. Henriette Combs office who reviewed with Dr. Hyman Hopes who plans to double patient's lisinopril/HCTZ. Dr. Hyman Hopes also suggested Nitro drip or other available intravenous blood pressure control.  I also notified Oswaldo Done, RN at Dr. Nicky Pugh office of patient's elevated BP, and that if patient's BP remains significantly  elevated on the day of surgery that the urgency of the procedure will need to be considered.   I notified patient of Dr. Jamie Brookes recommendations.  His BUN/Cr are WNL, so I instructed him to go a head and take his lisinopril/HCTZ in the morning with a sip of water.  Anesthesiologist Dr. Krista Blue updated.  Velna Ochs [facility]/Anesthesiology Phone [phone] 09/27/2012 5:09 PM

## 2012-09-27 NOTE — Telephone Encounter (Addendum)
Pt in pre-surgery now and PA anesthesiologist calling to report that bp is at 186/126 and would like to if there are any special instructions or if there needs to be an increase in dosage of bp meds before surgery scheduled for tomorrow 09/28/12.

## 2012-09-28 ENCOUNTER — Encounter (HOSPITAL_COMMUNITY)
Admission: RE | Disposition: A | Discharge status: Skilled Nursing Facility | Payer: Self-pay | Source: Ambulatory Visit | Attending: Vascular Surgery

## 2012-09-28 ENCOUNTER — Inpatient Hospital Stay (HOSPITAL_COMMUNITY)
Admission: RE | Admit: 2012-09-28 | Discharge: 2012-10-04 | DRG: 241 | Disposition: A | Discharge status: Skilled Nursing Facility | Payer: Medicaid Other | Source: Ambulatory Visit | Attending: Vascular Surgery | Admitting: Vascular Surgery

## 2012-09-28 ENCOUNTER — Inpatient Hospital Stay (HOSPITAL_COMMUNITY): Payer: Medicaid Other | Admitting: Anesthesiology

## 2012-09-28 ENCOUNTER — Encounter (HOSPITAL_COMMUNITY): Payer: Self-pay | Admitting: Certified Registered"

## 2012-09-28 ENCOUNTER — Encounter (HOSPITAL_COMMUNITY): Payer: Self-pay | Admitting: Vascular Surgery

## 2012-09-28 DIAGNOSIS — I70409 Unspecified atherosclerosis of autologous vein bypass graft(s) of the extremities, unspecified extremity: Principal | ICD-10-CM | POA: Diagnosis present

## 2012-09-28 DIAGNOSIS — Z79899 Other long term (current) drug therapy: Secondary | ICD-10-CM

## 2012-09-28 DIAGNOSIS — Z794 Long term (current) use of insulin: Secondary | ICD-10-CM

## 2012-09-28 DIAGNOSIS — Z87891 Personal history of nicotine dependence: Secondary | ICD-10-CM

## 2012-09-28 DIAGNOSIS — I739 Peripheral vascular disease, unspecified: Secondary | ICD-10-CM

## 2012-09-28 DIAGNOSIS — E785 Hyperlipidemia, unspecified: Secondary | ICD-10-CM | POA: Diagnosis present

## 2012-09-28 DIAGNOSIS — I70229 Atherosclerosis of native arteries of extremities with rest pain, unspecified extremity: Secondary | ICD-10-CM

## 2012-09-28 DIAGNOSIS — Z7902 Long term (current) use of antithrombotics/antiplatelets: Secondary | ICD-10-CM

## 2012-09-28 DIAGNOSIS — Z5900 Homelessness unspecified: Secondary | ICD-10-CM

## 2012-09-28 DIAGNOSIS — I10 Essential (primary) hypertension: Secondary | ICD-10-CM | POA: Diagnosis present

## 2012-09-28 DIAGNOSIS — E119 Type 2 diabetes mellitus without complications: Secondary | ICD-10-CM | POA: Diagnosis present

## 2012-09-28 DIAGNOSIS — G547 Phantom limb syndrome without pain: Secondary | ICD-10-CM | POA: Diagnosis not present

## 2012-09-28 HISTORY — PX: AMPUTATION: SHX166

## 2012-09-28 LAB — GLUCOSE, CAPILLARY
Glucose-Capillary: 181 mg/dL — ABNORMAL HIGH (ref 70–99)
Glucose-Capillary: 163 mg/dL — ABNORMAL HIGH (ref 70–99)

## 2012-09-28 LAB — CBC
HCT: 38.5 % — ABNORMAL LOW (ref 39.0–52.0)
MCHC: 34.3 g/dL (ref 30.0–36.0)
MCV: 84.4 fL (ref 78.0–100.0)
Platelets: 241 10*3/uL (ref 150–400)
RBC: 4.56 MIL/uL (ref 4.22–5.81)
RDW: 13.5 % (ref 11.5–15.5)
WBC: 11.8 10*3/uL — ABNORMAL HIGH (ref 4.0–10.5)

## 2012-09-28 LAB — CREATININE, SERUM
Creatinine, Ser: 0.81 mg/dL (ref 0.50–1.35)
GFR calc Af Amer: >90 mL/min (ref >90)
GFR calc non Af Amer: >90 mL/min (ref >90)

## 2012-09-28 SURGERY — AMPUTATION, ABOVE KNEE
Anesthesia: General | Site: Leg Upper | Laterality: Right | Wound class: Clean

## 2012-09-28 MED ORDER — POTASSIUM CHLORIDE CRYS ER 20 MEQ PO TBCR: 20.0000 meq | EXTENDED_RELEASE_TABLET | Freq: Once | ORAL | Status: AC | PRN

## 2012-09-28 MED ORDER — HYDROMORPHONE HCL PF 1 MG/ML IJ SOLN
INTRAMUSCULAR | Status: DC | PRN
  Administered 2012-09-28 (×4): 0.5 mg via INTRAVENOUS

## 2012-09-28 MED ORDER — HYDROMORPHONE HCL PF 1 MG/ML IJ SOLN
0.5000 mg | INTRAMUSCULAR | Status: DC | PRN
  Administered 2012-09-28 (×2): 1 mg via INTRAVENOUS
  Filled 2012-09-28 (×2): qty 1

## 2012-09-28 MED ORDER — OXYCODONE HCL 5 MG PO TABS
5.0000 mg | ORAL_TABLET | ORAL | Status: DC | PRN
  Administered 2012-09-28 – 2012-09-29 (×2): 10 mg via ORAL
  Filled 2012-09-28 (×2): qty 2

## 2012-09-28 MED ORDER — PHENOL 1.4 % MT LIQD: 1.0000 | OROMUCOSAL | Status: DC | PRN

## 2012-09-28 MED ORDER — HYDRALAZINE HCL 20 MG/ML IJ SOLN
10.0000 mg | INTRAMUSCULAR | Status: DC | PRN
  Administered 2012-09-28 – 2012-09-29 (×2): 10 mg via INTRAVENOUS
  Filled 2012-09-28 (×2): qty 1

## 2012-09-28 MED ORDER — ACETAMINOPHEN 325 MG PO TABS: 325.0000 mg | ORAL_TABLET | ORAL | Status: DC | PRN

## 2012-09-28 MED ORDER — ENOXAPARIN SODIUM 30 MG/0.3ML ~~LOC~~ SOLN
30.0000 mg | SUBCUTANEOUS | Status: DC
  Administered 2012-09-29 – 2012-10-03 (×5): 30 mg via SUBCUTANEOUS
  Filled 2012-09-28 (×7): qty 0.3

## 2012-09-28 MED ORDER — SODIUM CHLORIDE 0.9 % IV SOLN
INTRAVENOUS | Status: DC
  Administered 2012-09-28: 1000 mL via INTRAVENOUS
  Administered 2012-09-29 – 2012-09-30 (×3): via INTRAVENOUS

## 2012-09-28 MED ORDER — GUAIFENESIN-DM 100-10 MG/5ML PO SYRP: 15.0000 mL | ORAL_SOLUTION | ORAL | Status: DC | PRN

## 2012-09-28 MED ORDER — ONDANSETRON HCL 4 MG/2ML IJ SOLN: 4.0000 mg | Freq: Four times a day (QID) | INTRAMUSCULAR | Status: DC | PRN

## 2012-09-28 MED ORDER — 0.9 % SODIUM CHLORIDE (POUR BTL) OPTIME
TOPICAL | Status: DC | PRN
  Administered 2012-09-28: 1000 mL

## 2012-09-28 MED ORDER — HYDROCHLOROTHIAZIDE 12.5 MG PO CAPS
12.5000 mg | ORAL_CAPSULE | Freq: Every day | ORAL | Status: DC
  Administered 2012-09-29 – 2012-10-04 (×6): 12.5 mg via ORAL
  Filled 2012-09-28 (×6): qty 1

## 2012-09-28 MED ORDER — METOPROLOL TARTRATE 1 MG/ML IV SOLN
2.0000 mg | INTRAVENOUS | Status: DC | PRN
  Administered 2012-09-30: 5 mg via INTRAVENOUS
  Filled 2012-09-28: qty 5

## 2012-09-28 MED ORDER — ALUM & MAG HYDROXIDE-SIMETH 200-200-20 MG/5ML PO SUSP: 15.0000 mL | ORAL | Status: DC | PRN

## 2012-09-28 MED ORDER — OXYCODONE HCL 5 MG PO TABS
ORAL_TABLET | ORAL | Status: AC
  Administered 2012-09-28: 5 mg
  Filled 2012-09-28: qty 1

## 2012-09-28 MED ORDER — HYDRALAZINE HCL 20 MG/ML IJ SOLN
INTRAMUSCULAR | Status: AC
  Filled 2012-09-28: qty 1

## 2012-09-28 MED ORDER — HYDROMORPHONE HCL PF 1 MG/ML IJ SOLN
INTRAMUSCULAR | Status: AC
  Filled 2012-09-28: qty 1

## 2012-09-28 MED ORDER — LABETALOL HCL 5 MG/ML IV SOLN
10.0000 mg | INTRAVENOUS | Status: DC | PRN
  Administered 2012-09-28: 10 mg via INTRAVENOUS
  Filled 2012-09-28: qty 4

## 2012-09-28 MED ORDER — DEXTROSE 5 % IV SOLN
1.5000 g | Freq: Two times a day (BID) | INTRAVENOUS | Status: AC
  Administered 2012-09-28 – 2012-09-29 (×2): 1.5 g via INTRAVENOUS
  Filled 2012-09-28 (×3): qty 1.5

## 2012-09-28 MED ORDER — PROMETHAZINE HCL 25 MG/ML IJ SOLN: 6.2500 mg | INTRAMUSCULAR | Status: DC | PRN

## 2012-09-28 MED ORDER — ACETAMINOPHEN 650 MG RE SUPP: 325.0000 mg | RECTAL | Status: DC | PRN

## 2012-09-28 MED ORDER — PROPOFOL 10 MG/ML IV BOLUS
INTRAVENOUS | Status: DC | PRN
  Administered 2012-09-28: 30 mg via INTRAVENOUS
  Administered 2012-09-28: 130 mg via INTRAVENOUS

## 2012-09-28 MED ORDER — CLOPIDOGREL BISULFATE 75 MG PO TABS
75.0000 mg | ORAL_TABLET | Freq: Every day | ORAL | Status: DC
  Administered 2012-09-29 – 2012-10-04 (×6): 75 mg via ORAL
  Filled 2012-09-28 (×7): qty 1

## 2012-09-28 MED ORDER — INSULIN DETEMIR 100 UNIT/ML ~~LOC~~ SOLN
10.0000 [IU] | Freq: Every day | SUBCUTANEOUS | Status: DC
  Administered 2012-09-28 – 2012-10-03 (×6): 10 [IU] via SUBCUTANEOUS
  Filled 2012-09-28 (×7): qty 0.1

## 2012-09-28 MED ORDER — ALBUTEROL SULFATE HFA 108 (90 BASE) MCG/ACT IN AERS: 2.0000 | INHALATION_SPRAY | RESPIRATORY_TRACT | Status: DC | PRN

## 2012-09-28 MED ORDER — PHENYLEPHRINE HCL 10 MG/ML IJ SOLN
INTRAMUSCULAR | Status: DC | PRN
  Administered 2012-09-28: 120 ug via INTRAVENOUS
  Administered 2012-09-28: 80 ug via INTRAVENOUS

## 2012-09-28 MED ORDER — LIDOCAINE HCL (CARDIAC) 20 MG/ML IV SOLN
INTRAVENOUS | Status: DC | PRN
  Administered 2012-09-28: 100 mg via INTRAVENOUS

## 2012-09-28 MED ORDER — SENNOSIDES-DOCUSATE SODIUM 8.6-50 MG PO TABS
1.0000 | ORAL_TABLET | Freq: Every evening | ORAL | Status: DC | PRN
  Filled 2012-09-28: qty 1

## 2012-09-28 MED ORDER — PANTOPRAZOLE SODIUM 40 MG PO TBEC
40.0000 mg | DELAYED_RELEASE_TABLET | Freq: Every day | ORAL | Status: DC
  Administered 2012-09-28 – 2012-10-04 (×7): 40 mg via ORAL
  Filled 2012-09-28 (×7): qty 1

## 2012-09-28 MED ORDER — DOCUSATE SODIUM 100 MG PO CAPS
100.0000 mg | ORAL_CAPSULE | Freq: Every day | ORAL | Status: DC
  Administered 2012-09-29 – 2012-10-04 (×6): 100 mg via ORAL
  Filled 2012-09-28 (×8): qty 1

## 2012-09-28 MED ORDER — METFORMIN HCL ER 500 MG PO TB24
1000.0000 mg | ORAL_TABLET | Freq: Every day | ORAL | Status: DC
  Administered 2012-09-29 – 2012-10-04 (×6): 1000 mg via ORAL
  Filled 2012-09-28 (×7): qty 2

## 2012-09-28 MED ORDER — BISACODYL 10 MG RE SUPP
10.0000 mg | Freq: Every day | RECTAL | Status: DC | PRN
  Administered 2012-10-04: 10 mg via RECTAL
  Filled 2012-09-28: qty 1

## 2012-09-28 MED ORDER — LACTATED RINGERS IV SOLN
INTRAVENOUS | Status: DC
  Administered 2012-09-28 (×2): via INTRAVENOUS

## 2012-09-28 MED ORDER — SIMVASTATIN 40 MG PO TABS
40.0000 mg | ORAL_TABLET | Freq: Every day | ORAL | Status: DC
  Administered 2012-09-28 – 2012-10-03 (×6): 40 mg via ORAL
  Filled 2012-09-28 (×7): qty 1

## 2012-09-28 MED ORDER — FENTANYL CITRATE 0.05 MG/ML IJ SOLN
INTRAMUSCULAR | Status: DC | PRN
  Administered 2012-09-28 (×2): 50 ug via INTRAVENOUS
  Administered 2012-09-28 (×2): 25 ug via INTRAVENOUS
  Administered 2012-09-28: 150 ug via INTRAVENOUS
  Administered 2012-09-28: 25 ug via INTRAVENOUS
  Administered 2012-09-28: 50 ug via INTRAVENOUS
  Administered 2012-09-28: 25 ug via INTRAVENOUS
  Administered 2012-09-28: 50 ug via INTRAVENOUS
  Administered 2012-09-28: 25 ug via INTRAVENOUS
  Administered 2012-09-28: 100 ug via INTRAVENOUS
  Administered 2012-09-28: 25 ug via INTRAVENOUS

## 2012-09-28 MED ORDER — INFLUENZA VAC SPLIT QUAD 0.5 ML IM SUSP
0.5000 mL | INTRAMUSCULAR | Status: AC
  Administered 2012-09-29: 0.5 mL via INTRAMUSCULAR
  Filled 2012-09-28: qty 0.5

## 2012-09-28 MED ORDER — HYDROMORPHONE HCL PF 1 MG/ML IJ SOLN
0.2500 mg | INTRAMUSCULAR | Status: DC | PRN
  Administered 2012-09-28 (×2): 0.5 mg via INTRAVENOUS

## 2012-09-28 MED ORDER — LISINOPRIL 20 MG PO TABS
20.0000 mg | ORAL_TABLET | Freq: Every day | ORAL | Status: DC
  Administered 2012-09-29 – 2012-10-04 (×6): 20 mg via ORAL
  Filled 2012-09-28 (×6): qty 1

## 2012-09-28 MED ORDER — HYDROMORPHONE HCL PF 1 MG/ML IJ SOLN
2.0000 mg | INTRAMUSCULAR | Status: DC | PRN
  Administered 2012-09-28 – 2012-09-29 (×2): 2 mg via INTRAVENOUS
  Filled 2012-09-28 (×2): qty 2

## 2012-09-28 MED ORDER — ONDANSETRON HCL 4 MG/2ML IJ SOLN
INTRAMUSCULAR | Status: DC | PRN
  Administered 2012-09-28: 4 mg via INTRAVENOUS

## 2012-09-28 MED ORDER — LISINOPRIL-HYDROCHLOROTHIAZIDE 20-12.5 MG PO TABS: 1.0000 | ORAL_TABLET | Freq: Every day | ORAL | Status: DC

## 2012-09-28 MED ORDER — MAGNESIUM SULFATE 40 MG/ML IJ SOLN
2.0000 g | Freq: Once | INTRAMUSCULAR | Status: AC | PRN
  Filled 2012-09-28: qty 50

## 2012-09-28 SURGICAL SUPPLY — 51 items
BANDAGE ELASTIC 4 VELCRO ST LF (GAUZE/BANDAGES/DRESSINGS) ×2 IMPLANT
BANDAGE ELASTIC 6 VELCRO ST LF (GAUZE/BANDAGES/DRESSINGS) ×2 IMPLANT
BANDAGE ESMARK 6X9 LF (GAUZE/BANDAGES/DRESSINGS) ×1 IMPLANT
BANDAGE GAUZE ELAST BULKY 4 IN (GAUZE/BANDAGES/DRESSINGS) ×3 IMPLANT
BLADE SAW SAG 29X58X.64 (BLADE) ×2 IMPLANT
BNDG CMPR 9X6 STRL LF SNTH (GAUZE/BANDAGES/DRESSINGS) ×1
BNDG COHESIVE 6X5 TAN STRL LF (GAUZE/BANDAGES/DRESSINGS) ×2 IMPLANT
BNDG ESMARK 6X9 LF (GAUZE/BANDAGES/DRESSINGS) ×2
CANISTER SUCTION 2500CC (MISCELLANEOUS) ×2 IMPLANT
CLIP TI MEDIUM 6 (CLIP) ×2 IMPLANT
COVER SURGICAL LIGHT HANDLE (MISCELLANEOUS) ×2 IMPLANT
COVER TABLE BACK 60X90 (DRAPES) ×2 IMPLANT
CUFF TOURNIQUET SINGLE 44IN (TOURNIQUET CUFF) ×1 IMPLANT
DRAIN CHANNEL 19F RND (DRAIN) IMPLANT
DRAPE ORTHO SPLIT 77X108 STRL (DRAPES) ×4
DRAPE PROXIMA HALF (DRAPES) ×2 IMPLANT
DRAPE SURG ORHT 6 SPLT 77X108 (DRAPES) ×2 IMPLANT
DRAPE U-SHAPE 47X51 STRL (DRAPES) ×2 IMPLANT
DRSG ADAPTIC 3X8 NADH LF (GAUZE/BANDAGES/DRESSINGS) ×2 IMPLANT
ELECT REM PT RETURN 9FT ADLT (ELECTROSURGICAL) ×2
ELECTRODE REM PT RTRN 9FT ADLT (ELECTROSURGICAL) ×1 IMPLANT
EVACUATOR SILICONE 100CC (DRAIN) IMPLANT
GLOVE BIO SURGEON STRL SZ7 (GLOVE) ×2 IMPLANT
GLOVE BIO SURGEON STRL SZ7.5 (GLOVE) ×1 IMPLANT
GLOVE BIOGEL PI IND STRL 7.0 (GLOVE) IMPLANT
GLOVE BIOGEL PI IND STRL 7.5 (GLOVE) ×1 IMPLANT
GLOVE BIOGEL PI IND STRL 8 (GLOVE) IMPLANT
GLOVE BIOGEL PI INDICATOR 7.0 (GLOVE) ×1
GLOVE BIOGEL PI INDICATOR 7.5 (GLOVE) ×2
GLOVE BIOGEL PI INDICATOR 8 (GLOVE) ×1
GLOVE SS BIOGEL STRL SZ 7 (GLOVE) IMPLANT
GLOVE SUPERSENSE BIOGEL SZ 7 (GLOVE) ×1
GOWN STRL NON-REIN LRG LVL3 (GOWN DISPOSABLE) ×6 IMPLANT
KIT BASIN OR (CUSTOM PROCEDURE TRAY) ×2 IMPLANT
KIT ROOM TURNOVER OR (KITS) ×2 IMPLANT
NS IRRIG 1000ML POUR BTL (IV SOLUTION) ×2 IMPLANT
PACK GENERAL/GYN (CUSTOM PROCEDURE TRAY) ×2 IMPLANT
PAD ARMBOARD 7.5X6 YLW CONV (MISCELLANEOUS) ×4 IMPLANT
SPONGE GAUZE 4X4 12PLY (GAUZE/BANDAGES/DRESSINGS) ×3 IMPLANT
STAPLER VISISTAT 35W (STAPLE) ×3 IMPLANT
STOCKINETTE IMPERVIOUS LG (DRAPES) ×2 IMPLANT
SUT ETHILON 3 0 PS 1 (SUTURE) IMPLANT
SUT SILK 0 TIES 10X30 (SUTURE) ×2 IMPLANT
SUT SILK 2 0 (SUTURE) ×2
SUT SILK 2-0 18XBRD TIE 12 (SUTURE) ×1 IMPLANT
SUT VIC AB 2-0 CT1 18 (SUTURE) ×5 IMPLANT
SUT VIC AB 3-0 SH 18 (SUTURE) ×2 IMPLANT
TOWEL OR 17X24 6PK STRL BLUE (TOWEL DISPOSABLE) ×2 IMPLANT
TOWEL OR 17X26 10 PK STRL BLUE (TOWEL DISPOSABLE) ×2 IMPLANT
UNDERPAD 30X30 INCONTINENT (UNDERPADS AND DIAPERS) ×2 IMPLANT
WATER STERILE IRR 1000ML POUR (IV SOLUTION) ×2 IMPLANT

## 2012-09-28 NOTE — Anesthesia Preprocedure Evaluation (Signed)
Anesthesia Evaluation  Patient identified by MRN, date of birth, ID band Patient awake    Reviewed: Allergy & Precautions, H&P , NPO status , Patient's Chart, lab work & pertinent test results  History of Anesthesia Complications Negative for: history of anesthetic complications  Airway Mallampati: II TM Distance: >3 FB Neck ROM: Full    Dental  (+) Poor Dentition and Dental Advisory Given   Pulmonary neg pulmonary ROS, former smoker,    Pulmonary exam normal       Cardiovascular hypertension, Pt. on medications + Peripheral Vascular Disease     Neuro/Psych negative neurological ROS  negative psych ROS   GI/Hepatic Neg liver ROS, hiatal hernia, GERD-  Medicated,  Endo/Other  diabetes, Insulin Dependent  Renal/GU negative Renal ROS     Musculoskeletal   Abdominal   Peds  Hematology   Anesthesia Other Findings   Reproductive/Obstetrics                           Anesthesia Physical Anesthesia Plan  ASA: III  Anesthesia Plan: General   Post-op Pain Management:    Induction: Intravenous  Airway Management Planned: LMA  Additional Equipment:   Intra-op Plan:   Post-operative Plan: Extubation in OR  Informed Consent: I have reviewed the patients History and Physical, chart, labs and discussed the procedure including the risks, benefits and alternatives for the proposed anesthesia with the patient or authorized representative who has indicated his/her understanding and acceptance.   Dental advisory given  Plan Discussed with: CRNA, Anesthesiologist and Surgeon  Anesthesia Plan Comments:         Anesthesia Quick Evaluation

## 2012-09-28 NOTE — Op Note (Signed)
OPERATIVE NOTE   PROCEDURE: right above-the-knee amputation  PRE-OPERATIVE DIAGNOSIS: left leg rest pain  POST-OPERATIVE DIAGNOSIS: same as above  SURGEON: Leonides Sake, MD  ASSISTANT(S): Lianne Cure, PAC   ANESTHESIA: general  ESTIMATED BLOOD LOSS: 100 cc  FINDING(S): 1.  Viable thigh muscle 2.  Warm right foot and calf  SPECIMEN(S):  right above-the-knee amputation  INDICATIONS:   Javier Quinn is a 52 y.o. male who presents with left leg rest pain.  The patient underwent right superficial femoral artery to peroneal bypass with a marginal greater saphenous vein.  Unfortunately, his bypass failed due to poor vein quality.  I angioplastied the distal bypass to restore flow to right calf and foot.  This was partially successful as evident by the warm foot and calf, but the patient continues to complain of severe rest pain which narcotic use.  The patient is scheduled for a palliative left above-the-knee amputation.  I discussed in depth with the patient the risks, benefits, and alternatives to this procedure.  The patient is aware that the risk of this operation included but are not limited to:  bleeding, infection, myocardial infarction, stroke, death, failure to heal amputation wound, and possible need for more proximal amputation.  The patient is aware of the risks and agrees proceed forward with the procedure.  DESCRIPTION: After full informed written consent was obtained from the patient, the patient was brought back to the operating room, and placed supine upon the operating table.  Prior to induction, the patient received IV antibiotics.  The patient was then prepped and draped in the standard fashion for an above-the-knee amputation.  I placed a non-sterile tourniquet on the thigh prior to the procedure.  After obtaining adequate anesthesia, the patient was prepped and draped in the standard fashion for a above-the-knee amputation.  I marked out the anterior and posterior flaps  for a fish-mouth type of amputation.  I exsanguinated the leg with an Esmarch bandage and then inflated the tourniquet to 300 mm Hg.   I made the incisions for these flaps, and then dissected through the subcutaneous tissue, fascia, and muscles circumferentially.  I elevated  the periosteal tissue 4 cm more proximal than the anterior skin flap.  I then transected the femur with a power saw at this level.  Then I smoothed out the rough edges of the bone with a rasp.  At this point, the specimen was passed off the field as the above-the-knee amputation.  At this point, I clamped all visibly bleeding arteries and veins using a combination of suture ligation with Vicryl suture and electrocautery.  The tourniquet was then deflated at this point.  Bleeding continued to be controlled with electrocautery and suture ligature.  The stump was washed off with sterile normal saline and no further active bleeding was noted.  I reapproximated the anterior and posterior fascia  with interrupted stitches of 2-0 Vicryl.  This was completed along the entire length of anterior and posterior fascia until there were no more loose space in the fascial line.  The skin was then  reapproximated with staples.  The stump was washed off and dried.  The incision was dressed with [employer] and  then fluffs were applied.  Kerlix was wrapped around the leg and then gently an ACE wrap was applied.    COMPLICATIONS: none  CONDITION: stable   Leonides Sake, MD [facility] [place] Office: [phone] Pager: [phone]  09/28/2012, 12:02 PM

## 2012-09-28 NOTE — H&P (View-Only) (Signed)
[facility] [place]  Referred by:  Jeanann Lewandowsky, MD [address] [place], [place] [address]    History of Present Illness  Javier Quinn is a 52 y.o. (02/10/1960) male who presents with chief complaint: seveer right leg pain.  s/p Right superficial femoral artery to peroneal artery bypass with ipsilateral non-reverse greater saphenous vein (Date: 06/15/12) who presents with chief complaint: R leg pain and swelling.   He under went angioplasty of distal bypass and peroneal artery on 09/09/2012 which showed distal by pass occlusion.  At this point he is in severe pain and has no other revascularization options.  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   Past Medical History  Diagnosis Date  . Diabetes mellitus without complication   . Hyperlipidemia   . Tobacco dependence   . GERD (gastroesophageal reflux disease)   . H/O hiatal hernia   . Peripheral vascular disease   . Hypertension     See's Dr Riki Sheer     Past Surgical History  Procedure Laterality Date  . Bypass graft femoral-peroneal Right 06/15/2012    Procedure: BYPASS GRAFT [place] Neighbor;  Surgeon: Fransisco Hertz, MD;  Location: [facility] OR;  Service: Vascular;  Laterality: Right;  using right non-reversed saphenous vein  . Lower extremity angiogram Right 06/15/2012    Procedure: LOWER EXTREMITY ANGIOGRAM;  Surgeon: Fransisco Hertz, MD;  Location: [facility] OR;  Service: Vascular;  Laterality: Right;  . Lower extremity angiogram Right 09/09/2012    Procedure: LOWER EXTREMITY ANGIOGRAM With Agioplasty Bypass Graft;  Surgeon: Fransisco Hertz, MD;  Location: [facility] OR;  Service: Vascular;  Laterality: Right;    History   Social History  . Marital Status: Single    Spouse Name: N/A    Number of Children: N/A  . Years of Education: N/A   Occupational History  . Not on file.   Social History Main Topics  . Smoking status: Former Smoker -- 0.50 packs/day for 20 years    Types: Cigarettes    Quit date:  04/11/2012  . Smokeless tobacco: Former Neurosurgeon  . Alcohol Use: No     Comment: occ  . Drug Use: No  . Sexual Activity: Not on file   Other Topics Concern  . Not on file   Social History Narrative  . No narrative on file    Family History  Problem Relation Age of Onset  . Cancer Mother     pancreas  . Cancer Father       Current Outpatient Prescriptions on File Prior to Visit  Medication Sig Dispense Refill  . clopidogrel (PLAVIX) 75 MG tablet Take 1 tablet (75 mg total) by mouth daily with breakfast.  30 tablet  6  . insulin detemir (LEVEMIR) 100 UNIT/ML injection Inject 10 Units into the skin at bedtime.      [name] lisinopril-hydrochlorothiazide (PRINZIDE,ZESTORETIC) 20-12.5 MG per tablet Take 1 tablet by mouth daily.  30 tablet  4  . metFORMIN (GLUCOPHAGE XR) 500 MG 24 hr tablet Take 2 tablets (1,000 mg total) by mouth daily with breakfast.      . pravastatin (PRAVACHOL) 80 MG tablet Take 1 tablet (80 mg total) by mouth daily.  30 tablet  4  . albuterol (PROVENTIL HFA;VENTOLIN HFA) 108 (90 BASE) MCG/ACT inhaler Inhale 2 puffs into the lungs every 4 (four) hours as needed for wheezing.  1 each  0  . oxyCODONE (OXY IR/ROXICODONE) 5 MG immediate release tablet Take 1 tablet (5  mg total) by mouth every 6 (six) hours as needed.  50 tablet  0  . oxyCODONE (ROXICODONE) 5 MG immediate release tablet Take 2 tablets (10 mg total) by mouth every 4 (four) hours as needed for pain.  30 tablet  0   No current facility-administered medications on file prior to visit.    Allergies  Allergen Reactions  . Codeine Other (See Comments)    Causes constipation      REVIEW OF SYSTEMS:  (Positives checked otherwise negative)  CARDIOVASCULAR:  []  chest pain, []  chest pressure, []  palpitations, []  shortness of breath when laying flat, []  shortness of breath with exertion,  [x]  pain in feet when walking, x[]  pain in feet when laying flat, []  history of blood clot in veins (DVT), []  history of  phlebitis, [x]  swelling in legs, []  varicose veins  PULMONARY:  []  productive cough, [x]  asthma, []  wheezing  NEUROLOGIC:  []  weakness in arms or legs, []  numbness in arms or legs, []  difficulty speaking or slurred speech, []  temporary loss of vision in one eye, []  dizziness  HEMATOLOGIC:  []  bleeding problems, []  problems with blood clotting too easily  MUSCULOSKEL:  [x]  joint pain, []  joint swelling  GASTROINTEST:  []  vomiting blood, []  blood in stool     GENITOURINARY:  []  burning with urination, []  blood in urine  PSYCHIATRIC:  []  history of major depression  INTEGUMENTARY:  []  rashes, []  ulcers  CONSTITUTIONAL:  []  fever, []  chills  For VQI Use Only    PRE-ADM LIVING: Home  AMB STATUS: Ambulatory  CAD Sx: None  PRIOR CHF: None  STRESS TEST: [x ] No, [ ]  Normal, [ ]  + ischemia, [ ]  + MI, [ ]  Both   Physical Examination  Filed Vitals:   09/23/12 1158  BP: 187/11  Pulse: 64  Temp: 96.9 F (36.1 C)  TempSrc: Oral  Resp: 16  Height: 6' (1.829 m)  Weight: 232 lb (105.235 kg)  SpO2: 98%    Body mass index is 31.46 kg/(m^2). General: A&O x 3, WD, Obese,  Pulmonary: Sym exp, good air movt, CTAB, no rales, rhonchi, & wheezing  Cardiac: RRR, Nl S1, S2, no Murmurs, rubs or gallops  Vascular:  Vessel  Right  Left   Radial  Palpable  Palpable   Brachial  Palpable  Palpable   Carotid  Palpable, without bruit  Palpable, without bruit   Aorta  Not palpable  N/A   Femoral  Palpable  Palpable   Popliteal  Not palpable  Not palpable   PT  Not Palpable  NotPalpable   DP  Not Palpable  Not Palpable    Gastrointestinal: soft, NTND, -G/R, - HSM, - masses, - CVAT B   Musculoskeletal: M/S 5/5 throughout except right foot DF/PF 4/5, Extremities without ischemic changes , R foot is relatively warm , R calf swelling  Neurologic: Pain and light touch intact in extremities including R foot, Motor exam as listed above   Non-Invasive Vascular Imaging  RLE Arterial Duplex  (Date: 09/09/2012)  Patent graft  Occluded outflow  Medical Decision Making  Javier Quinn is a 52 y.o. male who presents with: sever left leg pain. Due to the outflow occlusion and severe pain in the left LE he will be scheduled for right AKA wed. 09/28/2012  Thomasena Edis, EMMA [facility] PA-C [facility] [place] Office: [phone]   09/23/2012, 12:45 PM  Addendum  I have independently interviewed and examined the patient, and I agree  with the physician assistant's findings.  As I discussed with the patient previously, his only option for further pain control at this point in R AKA, which he agreed to at this time.  I discussed in depth the nature of right above-the-knee amputation with the patient, including risks, benefits, and alternatives.  The patient is aware that the risks of right above-the-knee amputation include but are not limited to: bleeding, infection, myocardial infarction, stroke, death, failure to heal amputation wound, and possible need for more proximal amputation.  The patient is aware of the risks and agrees proceed forward with the procedure.  Leonides Sake, MD [facility] [place] Office: [phone] Pager: [phone]  09/23/2012, 2:02 PM

## 2012-09-28 NOTE — Anesthesia Procedure Notes (Signed)
Procedure Name: LMA Insertion Date/Time: 09/28/2012 10:46 AM Performed by: Jerilee Hoh Pre-anesthesia Checklist: Emergency Drugs available, Patient identified, Suction available and Patient being monitored Patient Re-evaluated:Patient Re-evaluated prior to inductionOxygen Delivery Method: Circle system utilized Preoxygenation: Pre-oxygenation with 100% oxygen Intubation Type: IV induction LMA: LMA inserted LMA Size: 5.0 Tube type: Oral Number of attempts: 1 Placement Confirmation: positive ETCO2 and breath sounds checked- equal and bilateral Tube secured with: Tape Dental Injury: Teeth and Oropharynx as per pre-operative assessment

## 2012-09-28 NOTE — Interval H&P Note (Signed)
History and Physical Interval Note:  09/28/2012 7:58 AM  Javier Quinn  has presented today for surgery, with the diagnosis of ISCHEMIC RIGHT LEG  The various methods of treatment have been discussed with the patient and family. After consideration of risks, benefits and other options for treatment, the patient has consented to  Procedure(s): AMPUTATION ABOVE KNEE (Right) as a surgical intervention .  The patient's history has been reviewed, patient examined, no change in status, stable for surgery.  I have reviewed the patient's chart and labs.  Questions were answered to the patient's satisfaction.     [name] LIANG-YU

## 2012-09-28 NOTE — Progress Notes (Signed)
BP 189/117; pt given pain meds and IV hydralazine PRN dose requested from pharmacy; will administer when available.

## 2012-09-28 NOTE — Consult Note (Signed)
Physical Medicine and Rehabilitation Consult  Reason for Consult: R-AKA due to RLE ischemia/PVD.  Referring Physician:  Dr. Imogene Burn   HPI: Javier Quinn is a 51 y.o. male with history of DM type 2, PVD with L-FPBG 6/14 but with worsening RLE pain and edema due to ischemia from occluded flow RLE. Patient elected to undergo R-AKA by Dr. Imogene Burn on 09/28/12.  PT evaluation done this am and MD/PT recommending CIR for progression.    Review of Systems  HENT: Negative for hearing loss.   Eyes: Negative for blurred vision and double vision.  Respiratory: Negative for cough, sputum production and wheezing.   Cardiovascular: Negative for chest pain and palpitations.  Gastrointestinal: Positive for heartburn and constipation. Negative for vomiting.  Genitourinary: Positive for frequency.  Musculoskeletal: Positive for back pain.       RTC problems right shoulder.   Neurological: Negative for headaches.  Psychiatric/Behavioral: The patient has insomnia.    Past Medical History  Diagnosis Date  . Diabetes mellitus without complication   . Hyperlipidemia   . Tobacco dependence   . GERD (gastroesophageal reflux disease)   . H/O hiatal hernia   . Peripheral vascular disease   . Hypertension     See's Dr Riki Sheer    Past Surgical History  Procedure Laterality Date  . Bypass graft femoral-peroneal Right 06/15/2012    Procedure: BYPASS GRAFT [place] Neighbor;  Surgeon: Fransisco Hertz, MD;  Location: [facility] OR;  Service: Vascular;  Laterality: Right;  using right non-reversed saphenous vein  . Lower extremity angiogram Right 06/15/2012    Procedure: LOWER EXTREMITY ANGIOGRAM;  Surgeon: Fransisco Hertz, MD;  Location: [facility] OR;  Service: Vascular;  Laterality: Right;  . Lower extremity angiogram Right 09/09/2012    Procedure: LOWER EXTREMITY ANGIOGRAM With Agioplasty Bypass Graft;  Surgeon: Fransisco Hertz, MD;  Location: [facility] OR;  Service: Vascular;  Laterality: Right;   Family History  Problem Relation Age of  Onset  . Cancer Mother     pancreas  . Cancer Father    Social History:  Single. Lives at [facility] out to eat. Sister provides meals occasionally. Sedentary with limited mobility past few months due to LE pain. 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 He reports that he quit smoking about 5 months ago. His smoking use included Cigarettes. He has a 10 pack-year smoking history. He has quit using smokeless tobacco. He reports that he does not drink alcohol anymore or use illicit drugs.   Allergies  Allergen Reactions  . Codeine Other (See Comments)    Causes constipation   Medications Prior to Admission  Medication Sig Dispense Refill  . clopidogrel (PLAVIX) 75 MG tablet Take 1 tablet (75 mg total) by mouth daily with breakfast.  30 tablet  6  . insulin detemir (LEVEMIR) 100 UNIT/ML injection Inject 10 Units into the skin at bedtime.      [name] lisinopril-hydrochlorothiazide (PRINZIDE,ZESTORETIC) 20-12.5 MG per tablet Take 1 tablet by mouth daily.  30 tablet  4  . metFORMIN (GLUCOPHAGE XR) 500 MG 24 hr tablet Take 2 tablets (1,000 mg total) by mouth daily with breakfast.      . oxyCODONE (OXY IR/ROXICODONE) 5 MG immediate release tablet Take 1 tablet (5 mg total) by mouth every 6 (six) hours as needed.  50 tablet  0  . oxyCODONE (ROXICODONE) 5 MG immediate release tablet Take 2 tablets (10 mg total) by mouth every 4 (four) hours as needed for pain.  30 tablet  0  .  pravastatin (PRAVACHOL) 80 MG tablet Take 1 tablet (80 mg total) by mouth daily.  30 tablet  4  . albuterol (PROVENTIL HFA;VENTOLIN HFA) 108 (90 BASE) MCG/ACT inhaler Inhale 2 puffs into the lungs every 4 (four) hours as needed for wheezing.  1 each  0    Home: Home Living Family/patient expects to be discharged to:: Inpatient rehab Living Arrangements: Alone  Functional History:   Functional Status:  Mobility:          ADL:    Cognition: Cognition Orientation Level: Oriented X4    Blood pressure 139/91, pulse  103, temperature 98.7 F (37.1 C), temperature source Oral, resp. rate 18, height 6' (1.829 m), weight 90.8 kg (200 lb 2.8 oz), SpO2 94.00%.  Physical Exam  Nursing note and vitals reviewed. Constitutional: He is oriented to person, place, and time. He appears well-developed and well-nourished.  HENT:  Head: Normocephalic and atraumatic.  Eyes: Pupils are equal, round, and reactive to light.  Neck: Normal range of motion.  Cardiovascular: Normal rate and regular rhythm.   No murmur heard. Pulmonary/Chest: Effort normal. No respiratory distress. He has wheezes.  Abdominal: Soft. Bowel sounds are normal. He exhibits no distension. There is no tenderness.  Musculoskeletal:  Right AKA hypersensitive and tender to touch. LLE without edema or breakdown.    Neurological: He is alert and oriented to person, place, and time. No cranial nerve deficit. Coordination normal.  Strength in unaffected limbs 5/5  Skin: Skin is warm and dry.  Psychiatric: He has a normal mood and affect. His behavior is normal. Judgment and thought content normal.    Results for orders placed during the hospital encounter of 09/28/12 (from the past 24 hour(s))  GLUCOSE, CAPILLARY     Status: Abnormal   Collection Time    09/28/12  9:03 AM      Result Value Range   Glucose-Capillary 181 (*) 70 - 99 mg/dL  GLUCOSE, CAPILLARY     Status: Abnormal   Collection Time    09/28/12 12:50 PM      Result Value Range   Glucose-Capillary 173 (*) 70 - 99 mg/dL   Comment 1 Documented in Chart     Comment 2 Notify RN    CBC     Status: Abnormal   Collection Time    09/28/12  3:48 PM      Result Value Range   WBC 11.8 (*) 4.0 - 10.5 K/uL   RBC 4.56  4.22 - 5.81 MIL/uL   Hemoglobin 13.2  13.0 - 17.0 g/dL   HCT 16.1 (*) 09.6 - 04.5 %   MCV 84.4  78.0 - 100.0 fL   MCH 28.9  26.0 - 34.0 pg   MCHC 34.3  30.0 - 36.0 g/dL   RDW 40.9  81.1 - 91.4 %   Platelets 241  150 - 400 K/uL  CREATININE, SERUM     Status: None    Collection Time    09/28/12  3:48 PM      Result Value Range   Creatinine, Ser 0.81  0.50 - 1.35 mg/dL   GFR calc non Af Amer >90  >90 mL/min   GFR calc Af Amer >90  >90 mL/min  GLUCOSE, CAPILLARY     Status: Abnormal   Collection Time    09/28/12 10:13 PM      Result Value Range   Glucose-Capillary 163 (*) 70 - 99 mg/dL   Comment 1 Documented in Chart     Comment 2  Notify RN    GLUCOSE, CAPILLARY     Status: Abnormal   Collection Time    09/29/12  5:57 AM      Result Value Range   Glucose-Capillary 173 (*) 70 - 99 mg/dL  BASIC METABOLIC PANEL     Status: Abnormal   Collection Time    09/29/12  6:00 AM      Result Value Range   Sodium 133 (*) 135 - 145 mEq/L   Potassium 3.8  3.5 - 5.1 mEq/L   Chloride 96  96 - 112 mEq/L   CO2 26  19 - 32 mEq/L   Glucose, Bld 171 (*) 70 - 99 mg/dL   BUN 8  6 - 23 mg/dL   Creatinine, Ser 4.09  0.50 - 1.35 mg/dL   Calcium 8.9  8.4 - 81.1 mg/dL   GFR calc non Af Amer >90  >90 mL/min   GFR calc Af Amer >90  >90 mL/min  CBC     Status: Abnormal   Collection Time    09/29/12  6:00 AM      Result Value Range   WBC 9.4  4.0 - 10.5 K/uL   RBC 4.42  4.22 - 5.81 MIL/uL   Hemoglobin 12.7 (*) 13.0 - 17.0 g/dL   HCT 91.4 (*) 78.2 - 95.6 %   MCV 85.1  78.0 - 100.0 fL   MCH 28.7  26.0 - 34.0 pg   MCHC 33.8  30.0 - 36.0 g/dL   RDW 21.3  08.6 - 57.8 %   Platelets 237  150 - 400 K/uL   No results found.  Assessment/Plan: Diagnosis: right AKA 1. Does the need for close, 24 hr/day medical supervision in concert with the patient's rehab needs make it unreasonable for this patient to be served in a less intensive setting? Yes and Potentially 2. Co-Morbidities requiring supervision/potential complications: pvd, Type 2 dm,  3. Due to bladder management, bowel management, safety, skin/wound care, disease management, medication administration, pain management and patient education, does the patient require 24 hr/day rehab nursing? Yes 4. Does the patient  require coordinated care of a physician, rehab nurse, PT (1-2 hrs/day, 5 days/week) and OT (1-2 hrs/day, 5 days/week) to address physical and functional deficits in the context of the above medical diagnosis(es)? Yes Addressing deficits in the following areas: balance, endurance, locomotion, strength, transferring, bowel/bladder control, bathing, dressing, feeding, grooming, toileting and psychosocial support 5. Can the patient actively participate in an intensive therapy program of at least 3 hrs of therapy per day at least 5 days per week? Yes 6. The potential for patient to make measurable gains while on inpatient rehab is excellent and good 7. Anticipated functional outcomes upon discharge from inpatient rehab are mod I with PT, mod I with OT, n/a with SLP. 8. Estimated rehab length of stay to reach the above functional goals is: potentially 7-10 days 9. Does the patient have adequate social supports to accommodate these discharge functional goals? Potentially 10. Anticipated D/C setting: Home 11. Anticipated post D/C treatments: HH therapy 12. Overall Rehab/Functional Prognosis: excellent  RECOMMENDATIONS: This patient's condition is appropriate for continued rehabilitative care in the following setting: CIR Patient has agreed to participate in recommended program. Yes/potentially Note that insurance prior authorization may be required for reimbursement for recommended care.  Comment: Pt lives in a motel. 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 We will follow as he begins to mobilize with therapies.   Ranelle Oyster, MD,  [facility] Physical Medicine & Rehabilitation     09/29/2012

## 2012-09-28 NOTE — Preoperative (Signed)
Beta Blockers   Reason not to administer Beta Blockers:Not Applicable 

## 2012-09-28 NOTE — Transfer of Care (Signed)
Immediate Anesthesia Transfer of Care Note  Patient: Javier Quinn  Procedure(s) Performed: Procedure(s): AMPUTATION ABOVE KNEE (Right)  Patient Location: PACU  Anesthesia Type:General  Level of Consciousness: awake, alert  and oriented  Airway & Oxygen Therapy: Patient Spontanous Breathing and Patient connected to face mask oxygen  Post-op Assessment: Report given to PACU RN, Post -op Vital signs reviewed and stable and Patient moving all extremities  Post vital signs: Reviewed and stable  Complications: No apparent anesthesia complications

## 2012-09-28 NOTE — Anesthesia Postprocedure Evaluation (Signed)
  Anesthesia Post-op Note  Patient: Javier Quinn  Procedure(s) Performed: Procedure(s): AMPUTATION ABOVE KNEE (Right)  Patient Location: PACU  Anesthesia Type:General  Level of Consciousness: awake, alert , oriented and patient cooperative  Airway and Oxygen Therapy: Patient Spontanous Breathing  Post-op Pain: severe  Post-op Assessment: Post-op Vital signs reviewed, Patient's Cardiovascular Status Stable, Respiratory Function Stable, Patent Airway, No signs of Nausea or vomiting and Pain level controlled  Post-op Vital Signs: stable  Complications: No apparent anesthesia complications

## 2012-09-29 ENCOUNTER — Encounter (HOSPITAL_COMMUNITY): Payer: Self-pay | Admitting: Vascular Surgery

## 2012-09-29 DIAGNOSIS — S78119A Complete traumatic amputation at level between unspecified hip and knee, initial encounter: Secondary | ICD-10-CM

## 2012-09-29 DIAGNOSIS — I739 Peripheral vascular disease, unspecified: Secondary | ICD-10-CM

## 2012-09-29 DIAGNOSIS — L98499 Non-pressure chronic ulcer of skin of other sites with unspecified severity: Secondary | ICD-10-CM

## 2012-09-29 LAB — CBC
HCT: 37.6 % — ABNORMAL LOW (ref 39.0–52.0)
Hemoglobin: 12.7 g/dL — ABNORMAL LOW (ref 13.0–17.0)
MCV: 85.1 fL (ref 78.0–100.0)
Platelets: 237 10*3/uL (ref 150–400)
RBC: 4.42 MIL/uL (ref 4.22–5.81)
RDW: 13.6 % (ref 11.5–15.5)
WBC: 9.4 10*3/uL (ref 4.0–10.5)

## 2012-09-29 LAB — BASIC METABOLIC PANEL
BUN: 8 mg/dL (ref 6–23)
CO2: 26 mEq/L (ref 19–32)
Calcium: 8.9 mg/dL (ref 8.4–10.5)
Creatinine, Ser: 0.79 mg/dL (ref 0.50–1.35)
GFR calc Af Amer: >90 mL/min (ref >90)
GFR calc non Af Amer: >90 mL/min (ref >90)
Glucose, Bld: 171 mg/dL — ABNORMAL HIGH (ref 70–99)
Sodium: 133 mEq/L — ABNORMAL LOW (ref 135–145)

## 2012-09-29 LAB — GLUCOSE, CAPILLARY
Glucose-Capillary: 173 mg/dL — ABNORMAL HIGH (ref 70–99)
Glucose-Capillary: 183 mg/dL — ABNORMAL HIGH (ref 70–99)

## 2012-09-29 MED ORDER — INSULIN ASPART 100 UNIT/ML ~~LOC~~ SOLN
0.0000 [IU] | Freq: Every day | SUBCUTANEOUS | Status: DC
  Administered 2012-10-01: 2 [IU] via SUBCUTANEOUS

## 2012-09-29 MED ORDER — ONDANSETRON HCL 4 MG/2ML IJ SOLN: 4.0000 mg | Freq: Four times a day (QID) | INTRAMUSCULAR | Status: DC | PRN

## 2012-09-29 MED ORDER — SODIUM CHLORIDE 0.9 % IJ SOLN: 9.0000 mL | INTRAMUSCULAR | Status: DC | PRN

## 2012-09-29 MED ORDER — NALOXONE HCL 0.4 MG/ML IJ SOLN: 0.4000 mg | INTRAMUSCULAR | Status: DC | PRN

## 2012-09-29 MED ORDER — MORPHINE SULFATE (PF) 1 MG/ML IV SOLN
INTRAVENOUS | Status: DC
  Administered 2012-09-29: 7.5 mg via INTRAVENOUS
  Administered 2012-09-29: 12:00:00 via INTRAVENOUS
  Administered 2012-09-29: 10.5 mg via INTRAVENOUS
  Administered 2012-09-29: 18:00:00 via INTRAVENOUS
  Administered 2012-09-30 (×2): 6 mg via INTRAVENOUS
  Administered 2012-09-30: 7 mg via INTRAVENOUS
  Administered 2012-09-30: 3 mg via INTRAVENOUS
  Administered 2012-09-30: 4.5 mg via INTRAVENOUS
  Administered 2012-09-30: 20:00:00 via INTRAVENOUS
  Administered 2012-10-01: 3 mg via INTRAVENOUS
  Filled 2012-09-29 (×4): qty 25

## 2012-09-29 MED ORDER — DIPHENHYDRAMINE HCL 50 MG/ML IJ SOLN: 12.5000 mg | Freq: Four times a day (QID) | INTRAMUSCULAR | Status: DC | PRN

## 2012-09-29 MED ORDER — HYDROMORPHONE HCL PF 1 MG/ML IJ SOLN
INTRAMUSCULAR | Status: AC
  Administered 2012-09-29: 2 mg
  Filled 2012-09-29: qty 2

## 2012-09-29 MED ORDER — DIPHENHYDRAMINE HCL 12.5 MG/5ML PO ELIX
12.5000 mg | ORAL_SOLUTION | Freq: Four times a day (QID) | ORAL | Status: DC | PRN
  Filled 2012-09-29: qty 5

## 2012-09-29 MED ORDER — GABAPENTIN 300 MG PO CAPS
300.0000 mg | ORAL_CAPSULE | Freq: Three times a day (TID) | ORAL | Status: DC
  Administered 2012-09-29 – 2012-09-30 (×6): 300 mg via ORAL
  Filled 2012-09-29 (×9): qty 1

## 2012-09-29 MED ORDER — INSULIN ASPART 100 UNIT/ML ~~LOC~~ SOLN
0.0000 [IU] | Freq: Three times a day (TID) | SUBCUTANEOUS | Status: DC
  Administered 2012-09-29: 2 [IU] via SUBCUTANEOUS
  Administered 2012-09-29: 3 [IU] via SUBCUTANEOUS
  Administered 2012-09-30 (×3): 2 [IU] via SUBCUTANEOUS
  Administered 2012-10-01: 3 [IU] via SUBCUTANEOUS
  Administered 2012-10-01: 2 [IU] via SUBCUTANEOUS
  Administered 2012-10-01 – 2012-10-02 (×2): 3 [IU] via SUBCUTANEOUS
  Administered 2012-10-02: 2 [IU] via SUBCUTANEOUS
  Administered 2012-10-02 – 2012-10-03 (×3): 3 [IU] via SUBCUTANEOUS
  Administered 2012-10-03 – 2012-10-04 (×2): 2 [IU] via SUBCUTANEOUS
  Administered 2012-10-04: 3 [IU] via SUBCUTANEOUS

## 2012-09-29 NOTE — Care Management Note (Signed)
    Page 1 of 1   10/04/2012     1:45:54 PM   CARE MANAGEMENT NOTE 10/04/2012  Patient:  Javier Quinn, Javier Quinn   Account Number:  [id number]  Date Initiated:  09/29/2012  Documentation initiated by:  Virgia Kelner  Subjective/Objective Assessment:   PT S/P RT AKA ON 09/28/12.  PTA, PT LIVES AT EXTENDED STAY MOTEL ALONE.  PT HAS SUPPORTIVE SISTER.     Action/Plan:   PT/OT RECOMMENDING CIR, AND REHAB C/S ORDERED.  WILL CONSULT CSW FOR SNF BACKUP, SHOULD CIR NOT WORK OUT.  WILL FOLLOW PROGRESS.   Anticipated DC Date:  10/01/2012   Anticipated DC Plan:  IP REHAB FACILITY  In-house referral  Clinical Social Worker      DC Planning Services  CM consult      Choice offered to / List presented to:             Status of service:  Completed, signed off Medicare Important Message given?   (If response is "NO", the following Medicare IM given date fields will be blank) Date Medicare IM given:   Date Additional Medicare IM given:    Discharge Disposition:  SKILLED NURSING FACILITY  Per UR Regulation:  Reviewed for med. necessity/level of care/duration of stay  If discussed at Long Length of Stay Meetings, dates discussed:   10/04/2012    Comments:  10/04/12 Javier Koska,RN,BSN [phone] PT DISCHARGED TO [facility] TODAY, PER CSW ARRANGEMENTS.

## 2012-09-29 NOTE — Evaluation (Signed)
Physical Therapy Evaluation Patient Details Name: Javier Quinn MRN: [id number] DOB: July 11, 1960 Today's Date: 09/29/2012 Time: 4098-1191 PT Time Calculation (min): 14 min  PT Assessment / Plan / Recommendation History of Present Illness  Javier Quinn is a 52 y.o. ([birth year]) male who presents with chief complaint: severe right leg pain.  s/p Right superficial femoral artery to peroneal artery bypass with ipsilateral non-reverse greater saphenous vein (Date: 06/15/12) who presents with chief complaint: R leg pain and swelling.    At this point he is in severe pain and has no other revascularization options.  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  Clinical Impression  Pt pleasant and willing to work despite pain at residual limb. Pt educated for positioning and HEp with AKA as well as progression. Pt states he is working on having friend Mika stay with him and sister when she isn't working. Pt wants to return to living independently and will benefit from acute therapy to address below deficits and maximize independence prior to discharge.     PT Assessment  Patient needs continued PT services    Follow Up Recommendations  CIR    Does the patient have the potential to tolerate intense rehabilitation      Barriers to Discharge Decreased caregiver support      Equipment Recommendations  Rolling walker with 5" wheels    Recommendations for Other Services OT consult;Rehab consult   Frequency Min 3X/week    Precautions / Restrictions Precautions Precautions: Fall Precaution Comments: R AKA   Pertinent Vitals/Pain 10/10 RLE, RN aware and repositioned but still willing to mobilize      Mobility  Bed Mobility Supine to Sit: 5: Supervision;With rails;HOB elevated Sitting - Scoot to Edge of Bed: 4: Min assist Details for Bed Mobility Assistance: cueing for sequence with increased time but only physical assist to scoot Transfers Transfers: Sit to Stand;Stand to Sit;Stand Pivot  Transfers Sit to Stand: 4: Min assist;From bed;From elevated surface Stand to Sit: 4: Min assist;To chair/3-in-1 Stand Pivot Transfers: 4: Min assist Details for Transfer Assistance: cueing for hand placement, sequence and safety with use of RW to pivot Ambulation/Gait Ambulation/Gait Assistance: Not tested (comment)    Exercises Amputee Exercises Hip Extension: AROM;Supine;Right;5 reps Hip ABduction/ADduction: AROM;Right;5 reps;Supine   PT Diagnosis: Difficulty walking;Acute pain  PT Problem List: Decreased strength;Decreased activity tolerance;Decreased balance;Decreased mobility;Pain;Decreased knowledge of use of DME PT Treatment Interventions: DME instruction;Gait training;Functional mobility training;Therapeutic activities;Therapeutic exercise;Balance training;Patient/family education     PT Goals(Current goals can be found in the care plan section) Acute Rehab PT Goals Patient Stated Goal: be able to walk PT Goal Formulation: With patient Time For Goal Achievement: 10/13/12 Potential to Achieve Goals: Good  Visit Information  Last PT Received On: 09/29/12 Assistance Needed: +1 History of Present Illness: Javier Quinn is a 52 y.o. (08/08/1960) male who presents with chief complaint: severe right leg pain.  s/p Right superficial femoral artery to peroneal artery bypass with ipsilateral non-reverse greater saphenous vein (Date: 06/15/12) who presents with chief complaint: R leg pain and swelling.    At this point he is in severe pain and has no other revascularization options.  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       Prior Functioning  Home Living Family/patient expects to be discharged to:: Inpatient rehab Living Arrangements: Alone Available Help at Discharge: Friend(s) Type of Home: Other(Comment) (motel) Home Access: Level entry Home Layout: One level Home Equipment: Walker - 2 wheels Prior  Function Level of Independence:  Independent Communication Communication: No difficulties    Cognition  Cognition Arousal/Alertness: Awake/alert Behavior During Therapy: WFL for tasks assessed/performed Overall Cognitive Status: Within Functional Limits for tasks assessed    Extremity/Trunk Assessment Upper Extremity Assessment Upper Extremity Assessment: Overall WFL for tasks assessed Lower Extremity Assessment Lower Extremity Assessment: RLE deficits/detail RLE Deficits / Details: limited due to pain grossly 2-/5 Cervical / Trunk Assessment Cervical / Trunk Assessment: Normal   Balance Balance Balance Assessed: Yes Static Sitting Balance Static Sitting - Balance Support: No upper extremity supported;Feet supported Static Sitting - Level of Assistance: 6: Modified independent (Device/Increase time) Static Sitting - Comment/# of Minutes: 3 Static Standing Balance Static Standing - Balance Support: Bilateral upper extremity supported Static Standing - Level of Assistance: 5: Stand by assistance Static Standing - Comment/# of Minutes: 2  End of Session PT - End of Session Equipment Utilized During Treatment: Gait belt Activity Tolerance: Patient limited by pain Patient left: in chair;with call bell/phone within reach Nurse Communication: Mobility status  GP     Delorse Lek 09/29/2012, 10:19 AM Delaney Meigs, PT [phone]

## 2012-09-29 NOTE — Telephone Encounter (Signed)
Was this situation addressed?

## 2012-09-29 NOTE — Progress Notes (Signed)
I met with patient at bedside. Patient has limited caregiver support as he was living in a hotel pta since his d/c from [facility] 07/22/12. I have left message for Javier Quinn's sister, Phylicia, to contact me to discuss Javier Quinn's rehab options. [phone]

## 2012-09-29 NOTE — Progress Notes (Signed)
Nutrition Brief Note  Patient identified on the Malnutrition Screening Tool (MST) Report  Wt Readings from Last 15 Encounters:  09/28/12 200 lb 2.8 oz (90.8 kg)  09/28/12 200 lb 2.8 oz (90.8 kg)  09/27/12 233 lb 11 oz (106 kg)  09/23/12 232 lb (105.235 kg)  09/20/12 228 lb (103.42 kg)  09/09/12 226 lb 3.1 oz (102.6 kg)  09/09/12 228 lb (103.42 kg)  09/09/12 226 lb 3.1 oz (102.6 kg)  08/22/12 228 lb (103.42 kg)  08/15/12 225 lb (102.059 kg)  08/10/12 223 lb (101.152 kg)  08/05/12 222 lb (100.699 kg)  08/04/12 220 lb (99.791 kg)  07/15/12 220 lb (99.791 kg)  07/01/12 220 lb 12.8 oz (100.154 kg)    Body mass index is 27.14 kg/(m^2). Patient meets criteria for overweight based on current BMI.   Current diet order is CHO Mod Medium, patient is consuming approximately 10% of meals at this time. Labs and medications reviewed.   Pt is s/p right AKA.  Wt previously stable.  Pt denies nutrition concerns. No nutrition interventions warranted at this time. If nutrition issues arise, please consult RD.   Loyce Dys, MS RD LDN Clinical Inpatient Dietitian Pager: [phone] Weekend/After hours pager: [phone]

## 2012-09-29 NOTE — Progress Notes (Addendum)
[facility]   Postoperative Visit - Amputation  Date of Surgery: 09/28/2012 Procedure(s): AMPUTATION ABOVE KNEE Right Surgeon: Surgeon(s): Fransisco Hertz, MD  1 Day Post-Op  Subjective Javier Quinn is a 52 y.o. male who is 1 Day Post-Op. Pt.reports  pain in the stump. The patient notes pain is not well controlled. Pt. reports phantom pain.  Significant Diagnostic Studies: CBC Lab Results  Component Value Date   WBC 9.4 09/29/2012   HGB 12.7* 09/29/2012   HCT 37.6* 09/29/2012   MCV 85.1 09/29/2012   PLT 237 09/29/2012    BMET    Component Value Date/Time   NA 136 09/27/2012 1310   K 4.3 09/27/2012 1310   CL 98 09/27/2012 1310   CO2 24 09/27/2012 1310   GLUCOSE 131* 09/27/2012 1310   BUN 10 09/27/2012 1310   CREATININE 0.81 09/28/2012 1548   CALCIUM 10.0 09/27/2012 1310   GFRNONAA >90 09/28/2012 1548   GFRAA >90 09/28/2012 1548    COAG Lab Results  Component Value Date   INR 0.89 09/27/2012   INR 0.93 09/09/2012   INR 0.94 06/08/2012   No results found for this basename: PTT     Intake/Output Summary (Last 24 hours) at 09/29/12 0742 Last data filed at 09/29/12 0700  Gross per 24 hour  Intake 3603.34 ml  Output    900 ml  Net 2703.34 ml   No data found.    Physical Examination  BP Readings from Last 3 Encounters:  09/29/12 147/85  09/29/12 147/85  09/27/12 172/118   Temp Readings from Last 3 Encounters:  09/29/12 98.2 F (36.8 C) Oral  09/29/12 98.2 F (36.8 C) Oral  09/27/12 97.3 F (36.3 C) Oral   SpO2 Readings from Last 3 Encounters:  09/29/12 98%  09/29/12 98%  09/27/12 97%   Pulse Readings from Last 3 Encounters:  09/29/12 103  09/29/12 103  09/27/12 84    Pt is A&Ox3  WDWN male with no complaints  Right amputation dressing is clean and dry Any motion causes his pain.  Assessment/plan:  Javier Quinn is a 52 y.o. male who is s/p Right Procedure(s): AMPUTATION ABOVE KNEE  Follow-up 4 weeks from surgery  I am  going to start neurontin 300mg  PO TID  Thomasena Edis EMMA MAUREEN 7:42 AM 09/29/2012  Addendum  I have independently interviewed and examined the patient, and I agree with the physician assistant's findings.  Bandage off tomorrow to evaluate right AKA.  Pain issues overnight, which are not surprising given the patient's likely narcotic tolerance.  Will start morphine PCA for the next couple of days.  Placement for rehab might be difficult due to lack of insurance in this patient.  SSI also added to regimen.  Leonides Sake, MD [facility] [place] Office: [phone] Pager: [phone]  09/29/2012, 8:03 AM

## 2012-09-29 NOTE — Progress Notes (Signed)
Patient has been having trouble with pain control, mostly phantom pain. Dr. Hart Rochester was notified and order for Dilaudid IV was increased from 1mg  to 2mg .  He also stated that if this dose did not control the pain that we could start a Dilaudid PCA.  Also, BPs have been elevated.  Last BP 172/95, prn Labetalol and Hydralazine have been administered during the shift.  Report now given to [place], RN and she will continue to monitor.  Arva Chafe

## 2012-09-29 NOTE — Evaluation (Signed)
Occupational Therapy Evaluation Patient Details Name: Javier Quinn MRN: [id number] DOB: Oct 14, 1960 Today's Date: 09/29/2012 Time: 4098-1191 OT Time Calculation (min): 19 min  OT Assessment / Plan / Recommendation History of present illness Javier Quinn is a 52 y.o. (1960/11/05) male who presents with chief complaint: severe right leg pain.  s/p Right superficial femoral artery to peroneal artery bypass with ipsilateral non-reverse greater saphenous vein (Date: 06/15/12) who presents with chief complaint: R leg pain and swelling.    At this point he is in severe pain and has no other revascularization options.  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  Highly motivated to return to independence.  Impaired balance interfering with ability to perform ADL.  Will benefit from inpatient rehab prior to return home, 3 in1, and tub transfer bench.  Will follow acutely.   OT Assessment  Patient needs continued OT Services    Follow Up Recommendations  CIR    Barriers to Discharge      Equipment Recommendations  3 in 1 bedside comode;Tub/shower bench (for return home)    Recommendations for Other Services Rehab consult  Frequency  Min 2X/week    Precautions / Restrictions Precautions Precautions: Fall Precaution Comments: R AKA Restrictions Weight Bearing Restrictions: No   Pertinent Vitals/Pain 10/10 R LE, medicated, awaiting PCA    ADL  Eating/Feeding: Independent Where Assessed - Eating/Feeding: Chair Grooming: Wash/dry hands;Brushing hair;Set up Where Assessed - Grooming: Supported sitting Upper Body Bathing: Supervision/safety Where Assessed - Upper Body Bathing: Unsupported sitting Lower Body Bathing: Moderate assistance Where Assessed - Lower Body Bathing: Unsupported sitting;Supported sit to stand Upper Body Dressing: Minimal assistance Where Assessed - Upper Body Dressing:  Unsupported sitting Lower Body Dressing: Moderate assistance Where Assessed - Lower Body Dressing: Unsupported sitting;Supported sit to stand Toilet Transfer: Minimal assistance Toilet Transfer Method: Stand pivot Toilet Transfer Equipment:  (chair to bed) Equipment Used: Gait belt;Rolling walker ADL Comments: balance interfering with ADL when seated unsupported    OT Diagnosis: Generalized weakness;Acute pain  OT Problem List: Decreased strength;Decreased activity tolerance;Impaired balance (sitting and/or standing);Decreased knowledge of use of DME or AE;Obesity;Pain OT Treatment Interventions: Self-care/ADL training;Therapeutic activities;Patient/family education;Balance training;DME and/or AE instruction   OT Goals(Current goals can be found in the care plan section) Acute Rehab OT Goals Patient Stated Goal: be able to walk OT Goal Formulation: With patient Time For Goal Achievement: 10/13/12 Potential to Achieve Goals: Good  Visit Information  Last OT Received On: 09/29/12 Assistance Needed: +1 History of Present Illness: Javier Quinn is a 52 y.o. (31-Oct-1960) male who presents with chief complaint: severe right leg pain.  s/p Right superficial femoral artery to peroneal artery bypass with ipsilateral non-reverse greater saphenous vein (Date: 06/15/12) who presents with chief complaint: R leg pain and swelling.    At this point he is in severe pain and has no other revascularization options.  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       Prior Functioning     Home Living Family/patient expects to be discharged to:: Inpatient rehab Living Arrangements: Alone Available Help at Discharge: Friend(s) Type of Home: Other(Comment) Home Access: Level entry Home Layout: One level Home Equipment: Walker - 2 wheels Prior Function Level of Independence: Independent Communication Communication: No difficulties Dominant Hand: Left         Vision/Perception Vision -  History Baseline Vision: Wears glasses only  for reading Patient Visual Report: No change from baseline   Cognition  Cognition Arousal/Alertness: Awake/alert Behavior During Therapy: WFL for tasks assessed/performed Overall Cognitive Status: Within Functional Limits for tasks assessed    Extremity/Trunk Assessment Upper Extremity Assessment Upper Extremity Assessment: Overall WFL for tasks assessed (thinks he has a rotator cuff injury on th R) Lower Extremity Assessment Lower Extremity Assessment: Defer to PT evaluation RLE Deficits / Details: limited due to pain grossly 2-/5 Cervical / Trunk Assessment Cervical / Trunk Assessment: Normal     Mobility Bed Mobility Bed Mobility: Sit to Supine Supine to Sit: 5: Supervision;With rails;HOB elevated Sitting - Scoot to Edge of Bed: 4: Min assist Details for Bed Mobility Assistance: cueing for sequence with increased time but only physical assist to scoot Transfers Sit to Stand: 4: Min assist;With upper extremity assist;Not tested (comment);From chair/3-in-1 Stand to Sit: 4: Min assist;With upper extremity assist;To chair/3-in-1 Details for Transfer Assistance: cueing for hand placement, sequence and safety with use of RW to pivot     Exercise    Balance Balance Balance Assessed: Yes Static Sitting Balance Static Sitting - Balance Support: No upper extremity supported;Feet supported Static Sitting - Level of Assistance: 6: Modified independent (Device/Increase time) Static Sitting - Comment/# of Minutes: 3 Dynamic Sitting Balance Dynamic Sitting - Level of Assistance: 5: Stand by assistance Static Standing Balance Static Standing - Balance Support: Bilateral upper extremity supported Static Standing - Level of Assistance: 5: Stand by assistance Static Standing - Comment/# of Minutes: 2   End of Session OT - End of Session Activity Tolerance: Patient limited by pain Patient left: in bed;with call bell/phone within reach  GO      Evern Bio 09/29/2012, 11:45 AM [phone]

## 2012-09-30 ENCOUNTER — Encounter: Payer: Medicaid Other | Admitting: Vascular Surgery

## 2012-09-30 LAB — CBC
Platelets: 217 10*3/uL (ref 150–400)
RBC: 4.27 MIL/uL (ref 4.22–5.81)
WBC: 10 10*3/uL (ref 4.0–10.5)

## 2012-09-30 LAB — BASIC METABOLIC PANEL
CO2: 27 mEq/L (ref 19–32)
Calcium: 8.9 mg/dL (ref 8.4–10.5)
Creatinine, Ser: 0.84 mg/dL (ref 0.50–1.35)
GFR calc Af Amer: >90 mL/min (ref >90)
GFR calc non Af Amer: >90 mL/min (ref >90)
Sodium: 135 mEq/L (ref 135–145)

## 2012-09-30 LAB — GLUCOSE, CAPILLARY: Glucose-Capillary: 128 mg/dL — ABNORMAL HIGH (ref 70–99)

## 2012-09-30 NOTE — Progress Notes (Signed)
Clinical Social Work Department BRIEF PSYCHOSOCIAL ASSESSMENT 09/30/2012  Patient:  Javier Quinn, Javier Quinn     Account Number:  [id number]     Admit date:  09/28/2012  Clinical Social Worker:  Carren Rang  Date/Time:  09/30/2012 04:34 PM  Referred by:  Care Management  Date Referred:  09/30/2012 Referred for  SNF Placement   Other Referral:   Interview type:  Patient Other interview type:    PSYCHOSOCIAL DATA Living Status:  ALONE Admitted from facility:   Level of care:   Primary support name:  Javier Quinn,Javier Quinn Primary support relationship to patient:  SIBLING Degree of support available:   Good    CURRENT CONCERNS Current Concerns  Post-Acute Placement   Other Concerns:    SOCIAL WORK ASSESSMENT / PLAN CSW received referral for SNF placement. CSW introduced self and explained purpose for visit. Patient stated he is homeless and wants to go to SNF- Patient agreeable to being faxed out to [facility]. CSW will complete FL2 for MD signature and will update patient when bed offers are made.   Assessment/plan status:  Psychosocial Support/Ongoing Assessment of Needs Other assessment/ plan:   Information/referral to community resources:   SNF information    PATIENT'S/FAMILY'S RESPONSE TO PLAN OF CARE: Patient agreeable for SNF        Maree Krabbe, MSW, [place] [phone]

## 2012-09-30 NOTE — Progress Notes (Signed)
Inpatient rehab beds limited at this time. Sister and brother in law assisting in locating SNF bed for pt long term due to his limited resources. They prefer [facility] SNF for he was there this summer. They have been in contact with them. I will alert RN CM and SW. [phone]

## 2012-09-30 NOTE — Progress Notes (Signed)
Physical Therapy Treatment Patient Details Name: Javier Quinn MRN: [id number] DOB: [birth year] Today's Date: 09/30/2012 Time: 3086-5784 PT Time Calculation (min): 24 min  PT Assessment / Plan / Recommendation  History of Present Illness Javier Quinn is a 52 y.o. ([birth year]) male who presents with chief complaint: severe right leg pain.  s/p Right superficial femoral artery to peroneal artery bypass with ipsilateral non-reverse greater saphenous vein (Date: 06/15/12) who presents with chief complaint: R leg pain and swelling.    At this point he is in severe pain and has no other revascularization options.  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 Pt encouraged to have family bring shoe for left foot to decrease impact with gait. Will follow   Follow Up Recommendations  SNF     Does the patient have the potential to tolerate intense rehabilitation     Barriers to Discharge        Equipment Recommendations       Recommendations for Other Services    Frequency     Progress towards PT Goals Progress towards PT goals: Progressing toward goals  Plan Discharge plan needs to be updated    Precautions / Restrictions Precautions Precautions: Fall Precaution Comments: R AKA   Pertinent Vitals/Pain HR 110, sats 96% on RA 7/10 residual limb despite PCA    Mobility  Bed Mobility Bed Mobility: Not assessed Details for Bed Mobility Assistance: pt in recliner on arrival Transfers Sit to Stand: 4: Min assist;With upper extremity assist;With armrests;From chair/3-in-1 Stand to Sit: 4: Min guard;With armrests;To chair/3-in-1 Details for Transfer Assistance: cueing for hand placement, sequence and safety  Ambulation/Gait Ambulation/Gait Assistance: 4: Min guard Ambulation Distance (Feet): 32 Feet Assistive device: Rolling walker Ambulation/Gait Assistance Details: cueing for posture  and position in RW Gait Pattern: Step-to pattern Gait velocity: decreased Stairs: No    Exercises General Exercises - Lower Extremity Long Arc Quad: AROM;Left;20 reps;Seated Hip Flexion/Marching: AROM;Left;20 reps;Seated Amputee Exercises Hip Extension: AROM;Right;10 reps;Seated Hip ABduction/ADduction: AROM;Right;10 reps;Seated   PT Diagnosis:    PT Problem List:   PT Treatment Interventions:     PT Goals (current goals can now be found in the care plan section)    Visit Information  Last PT Received On: 09/30/12 Assistance Needed: +1 History of Present Illness: Javier Quinn is a 52 y.o. (01-27-1960) male who presents with chief complaint: severe right leg pain.  s/p Right superficial femoral artery to peroneal artery bypass with ipsilateral non-reverse greater saphenous vein (Date: 06/15/12) who presents with chief complaint: R leg pain and swelling.    At this point he is in severe pain and has no other revascularization options.  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

## 2012-09-30 NOTE — Progress Notes (Signed)
Clinical Social Work Department CLINICAL SOCIAL WORK PLACEMENT NOTE 09/30/2012  Patient:  OVA, MEEGAN  Account Number:  [id number] Admit date:  09/28/2012  Clinical Social Worker:  Carren Rang  Date/time:  09/30/2012 04:38 PM  Clinical Social Work is seeking post-discharge placement for this patient at the following level of care:   SKILLED NURSING   (*CSW will update this form in Epic as items are completed)   09/30/2012  Patient/family provided with [facility] Department of Clinical Social Work's list of facilities offering this level of care within the geographic area requested by the patient (or if unable, by the patient's family).  09/30/2012  Patient/family informed of their freedom to choose among providers that offer the needed level of care, that participate in Medicare, Medicaid or managed care program needed by the patient, have an available bed and are willing to accept the patient.  09/30/2012  Patient/family informed of [facility]' ownership interest in [facility], as well as of the fact that they are under no obligation to receive care at this facility.  PASARR submitted to EDS on 09/29/2012 PASARR number received from EDS on 09/29/2012  FL2 transmitted to all facilities in geographic area requested by pt/family on  09/30/2012 FL2 transmitted to all facilities within larger geographic area on   Patient informed that his/her managed care company has contracts with or will negotiate with  certain facilities, including the following:     Patient/family informed of bed offers received:   Patient chooses bed at  Physician recommends and patient chooses bed at    Patient to be transferred to  on   Patient to be transferred to facility by   The following physician request were entered in Epic:   Additional Comments:  Maree Krabbe, MSW, [employer] [phone]

## 2012-09-30 NOTE — Progress Notes (Signed)
[facility] [place]  Daily Progress Note  Assessment/Planning: POD #2 s/p R AKA   Awaiting rehab disposition  Cont PT/OT  Subjective  - 2 Days Post-Op  Pain better controlled  Objective Filed Vitals:   09/30/12 0412 09/30/12 0415 09/30/12 0800 09/30/12 1449  BP: 162/88  160/92 148/87  Pulse: 104  110 116  Temp: 99.5 F (37.5 C)  100 F (37.8 C) 98.5 F (36.9 C)  TempSrc: Oral  Oral Oral  Resp: 20 17 19 19   Height:      Weight:      SpO2: 100%  100% 97%    Intake/Output Summary (Last 24 hours) at 09/30/12 1530 Last data filed at 09/30/12 1400  Gross per 24 hour  Intake   1905 ml  Output   1800 ml  Net    105 ml    PULM  CTAB CV  RRR GI  soft, NTND VASC  R AKA inc c/d/i, viable stump  Laboratory CBC    Component Value Date/Time   WBC 10.0 09/30/2012 0613   HGB 12.4* 09/30/2012 0613   HCT 36.6* 09/30/2012 0613   PLT 217 09/30/2012 0613    BMET    Component Value Date/Time   NA 135 09/30/2012 0613   K 3.5 09/30/2012 0613   CL 97 09/30/2012 0613   CO2 27 09/30/2012 0613   GLUCOSE 146* 09/30/2012 0613   BUN 7 09/30/2012 0613   CREATININE 0.84 09/30/2012 0613   CALCIUM 8.9 09/30/2012 0613   GFRNONAA >90 09/30/2012 0613   GFRAA >90 09/30/2012 9629    Leonides Sake, MD [facility] [place] Office: [phone] Pager: [phone]  09/30/2012, 3:30 PM

## 2012-10-01 LAB — GLUCOSE, CAPILLARY: Glucose-Capillary: 156 mg/dL — ABNORMAL HIGH (ref 70–99)

## 2012-10-01 MED ORDER — POLYETHYLENE GLYCOL 3350 17 G PO PACK
17.0000 g | PACK | Freq: Every day | ORAL | Status: DC
  Administered 2012-10-01 – 2012-10-03 (×3): 17 g via ORAL
  Filled 2012-10-01 (×4): qty 1

## 2012-10-01 MED ORDER — OXYCODONE-ACETAMINOPHEN 5-325 MG PO TABS
1.0000 | ORAL_TABLET | ORAL | Status: DC | PRN
  Administered 2012-10-01 – 2012-10-02 (×3): 2 via ORAL
  Administered 2012-10-02: 1 via ORAL
  Administered 2012-10-02: 2 via ORAL
  Administered 2012-10-03: 1 via ORAL
  Filled 2012-10-01: qty 2
  Filled 2012-10-01: qty 1
  Filled 2012-10-01 (×3): qty 2
  Filled 2012-10-01: qty 1

## 2012-10-01 MED ORDER — PREGABALIN 50 MG PO CAPS
50.0000 mg | ORAL_CAPSULE | Freq: Two times a day (BID) | ORAL | Status: DC
  Administered 2012-10-01 – 2012-10-04 (×7): 50 mg via ORAL
  Filled 2012-10-01 (×7): qty 1

## 2012-10-01 MED ORDER — SENNOSIDES-DOCUSATE SODIUM 8.6-50 MG PO TABS
2.0000 | ORAL_TABLET | Freq: Every day | ORAL | Status: DC
  Administered 2012-10-01 – 2012-10-03 (×3): 2 via ORAL
  Filled 2012-10-01 (×4): qty 2

## 2012-10-01 MED ORDER — HYDROMORPHONE HCL PF 1 MG/ML IJ SOLN: 0.5000 mg | INTRAMUSCULAR | Status: DC | PRN

## 2012-10-01 NOTE — Progress Notes (Addendum)
[facility] [place]  Postoperative Visit - Amputation  Date of Surgery: 09/28/2012 Procedure(s): AMPUTATION ABOVE KNEE Right Surgeon: Surgeon(s): Fransisco Hertz, MD  3 Days Post-Op  Subjective Javier Quinn is a 52 y.o. male who is 3 Days Post-Op. Pt.reports  pain in the stump. The patient notes pain is not well controlled. Pt. reports phantom pain. Pt has been on gabapentin but this is not helping with pain. Still on PCA  Significant Diagnostic Studies: CBC Lab Results  Component Value Date   WBC 10.0 09/30/2012   HGB 12.4* 09/30/2012   HCT 36.6* 09/30/2012   MCV 85.7 09/30/2012   PLT 217 09/30/2012    BMET    Component Value Date/Time   NA 135 09/30/2012 0613   K 3.5 09/30/2012 0613   CL 97 09/30/2012 0613   CO2 27 09/30/2012 0613   GLUCOSE 146* 09/30/2012 0613   BUN 7 09/30/2012 0613   CREATININE 0.84 09/30/2012 0613   CALCIUM 8.9 09/30/2012 0613   GFRNONAA >90 09/30/2012 0613   GFRAA >90 09/30/2012 0613    COAG Lab Results  Component Value Date   INR 0.89 09/27/2012   INR 0.93 09/09/2012   INR 0.94 06/08/2012   No results found for this basename: PTT     Intake/Output Summary (Last 24 hours) at 10/01/12 0817 Last data filed at 10/01/12 0700  Gross per 24 hour  Intake    640 ml  Output   1880 ml  Net  -1240 ml   No data found.    Physical Examination  BP Readings from Last 3 Encounters:  10/01/12 124/78  10/01/12 124/78  09/27/12 172/118   Temp Readings from Last 3 Encounters:  10/01/12 99.3 F (37.4 C) Oral  10/01/12 99.3 F (37.4 C) Oral  09/27/12 97.3 F (36.3 C) Oral   SpO2 Readings from Last 3 Encounters:  10/01/12 98%  10/01/12 98%  09/27/12 97%   Pulse Readings from Last 3 Encounters:  10/01/12 113  10/01/12 113  09/27/12 84    Pt is A&Ox3  WDWN male with complaints of pain  Right amputation wound has minimal drainage and healing well.  There is good bone coverage in the stump Stump is warm and well perfused,without  erythema   Assessment/plan:  Javier Quinn is a 52 y.o. male who is s/p Right Procedure(s): AMPUTATION ABOVE KNEE Pain control- cont PCA - will switch pt to lyrica as gabapentin not helping at this point Cont PT  The patient's stump is viable.  Follow-up 4 weeks from surgery  Plan SNF after DC  ROCZNIAK,REGINA J 8:17 AM 10/01/2012  Addendum  I have independently interviewed and examined the patient, and I agree with the physician assistant's findings.  Viable R AKA stump.  Bandage in stump sock  Leonides Sake, MD [facility] [place] Office: [phone] Pager: [phone]  10/01/2012, 9:29 AM

## 2012-10-02 LAB — GLUCOSE, CAPILLARY
Glucose-Capillary: 166 mg/dL — ABNORMAL HIGH (ref 70–99)
Glucose-Capillary: 156 mg/dL — ABNORMAL HIGH (ref 70–99)

## 2012-10-02 NOTE — Progress Notes (Signed)
[facility] [place]  Daily Progress Note  Assessment/Planning: POD #4 s/p R AKA   Awaiting SNF placement  Pt is medically ready at this point  Subjective  - 4 Days Post-Op  Pt ok with PO rx, better than preop  Objective Filed Vitals:   10/01/12 0958 10/01/12 1338 10/01/12 1951 10/02/12 0428  BP: 135/77 133/64 111/72 120/83  Pulse:  102 91 86  Temp:  98.2 F (36.8 C) 98.9 F (37.2 C) 98.6 F (37 C)  TempSrc:  Oral Oral Oral  Resp:  19 17 18   Height:      Weight:      SpO2:  96% 96% 94%    Intake/Output Summary (Last 24 hours) at 10/02/12 0831 Last data filed at 10/02/12 0500  Gross per 24 hour  Intake    480 ml  Output   2375 ml  Net  -1895 ml    PULM  CTAB CV  RRR GI  soft, NTND VASC  R AKA stump viable, staples in place  Laboratory CBC    Component Value Date/Time   WBC 10.0 09/30/2012 0613   HGB 12.4* 09/30/2012 0613   HCT 36.6* 09/30/2012 0613   PLT 217 09/30/2012 0613    BMET    Component Value Date/Time   NA 135 09/30/2012 0613   K 3.5 09/30/2012 0613   CL 97 09/30/2012 0613   CO2 27 09/30/2012 0613   GLUCOSE 146* 09/30/2012 0613   BUN 7 09/30/2012 0613   CREATININE 0.84 09/30/2012 0613   CALCIUM 8.9 09/30/2012 0613   GFRNONAA >90 09/30/2012 0613   GFRAA >90 09/30/2012 1610    Leonides Sake, MD [facility] [place] Office: [phone] Pager: [phone]  10/02/2012, 8:31 AM

## 2012-10-03 LAB — GLUCOSE, CAPILLARY
Glucose-Capillary: 152 mg/dL — ABNORMAL HIGH (ref 70–99)
Glucose-Capillary: 154 mg/dL — ABNORMAL HIGH (ref 70–99)

## 2012-10-03 MED ORDER — DIPHENHYDRAMINE HCL 25 MG PO CAPS
25.0000 mg | ORAL_CAPSULE | Freq: Every evening | ORAL | Status: DC | PRN
  Administered 2012-10-03: 25 mg via ORAL
  Filled 2012-10-03: qty 1

## 2012-10-03 NOTE — Progress Notes (Signed)
CSW still searching for a SNF bed. Will update when SNF bed available.  Maree Krabbe, MSW, [facility] [phone]

## 2012-10-03 NOTE — Progress Notes (Signed)
Physical Therapy Treatment Patient Details Name: Javier Quinn MRN: [id number] DOB: February 16, 1960 Today's Date: 10/03/2012 Time: 8295-6213 PT Time Calculation (min): 28 min  PT Assessment / Plan / Recommendation  History of Present Illness Javier Quinn is a 52 y.o. (1960/02/19) male who presents with chief complaint: severe right leg pain.  s/p Right superficial femoral artery to peroneal artery bypass with ipsilateral non-reverse greater saphenous vein (Date: 06/15/12) who presents with chief complaint: R leg pain and swelling.    At this point he is in severe pain and has no other revascularization options.  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 Pt concerned over need to have BM but even with sitting at toilet unable. Pt encouraged to continue mobility, HEP and OOB throughout day with nursing.  Follow Up Recommendations  SNF     Does the patient have the potential to tolerate intense rehabilitation     Barriers to Discharge        Equipment Recommendations       Recommendations for Other Services    Frequency     Progress towards PT Goals Progress towards PT goals: Progressing toward goals  Plan Current plan remains appropriate    Precautions / Restrictions Precautions Precautions: Fall Precaution Comments: R AKA   Pertinent Vitals/Pain 5/10 RLE pain, repositioned    Mobility  Bed Mobility Bed Mobility: Rolling Left;Left Sidelying to Sit Rolling Left: 6: Modified independent (Device/Increase time);With rail Left Sidelying to Sit: 5: Supervision;HOB flat;With rails Sitting - Scoot to Edge of Bed: 6: Modified independent (Device/Increase time) Details for Bed Mobility Assistance: cueing for sequence with bed flat Transfers Sit to Stand: 4: Min guard;From bed;From chair/3-in-1;From toilet;With armrests Stand to Sit: 4: Min guard;With armrests;To chair/3-in-1 Details for  Transfer Assistance: cueing for hand placement, sequence and safety. At toilet pt requested to sit directly on bowl despite education for need to have seat and stood with use of rail and stabilized RW Ambulation/Gait Ambulation/Gait Assistance: 4: Min guard Ambulation Distance (Feet): 60 Feet (60', 15' with seated rest) Assistive device: Rolling walker Ambulation/Gait Assistance Details: cueing to look up and step into RW with gait. Pt still without shoe present despite urge to have family bring it in. Gait Pattern: Step-to pattern Gait velocity: decreased Stairs: No    Exercises Amputee Exercises Hip Extension: AROM;15 reps;Right;Sidelying Hip ABduction/ADduction: AROM;15 reps;Right;Supine   PT Diagnosis:    PT Problem List:   PT Treatment Interventions:     PT Goals (current goals can now be found in the care plan section)    Visit Information  Last PT Received On: 10/03/12 Assistance Needed: +1 History of Present Illness: Javier Quinn is a 52 y.o. (November 05, 1960) male who presents with chief complaint: severe right leg pain.  s/p Right superficial femoral artery to peroneal artery bypass with ipsilateral non-reverse greater saphenous vein (Date: 06/15/12) who presents with chief complaint: R leg pain and swelling.    At this point he is in severe pain and has no other revascularization options.  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

## 2012-10-03 NOTE — Progress Notes (Signed)
CSW spoke to [facility] Admissions who stated that they can offer a patient a bed for tomorrow. CSW will update RN and patient about SNF plans.  Maree Krabbe, MSW, [facility] [phone]

## 2012-10-03 NOTE — Progress Notes (Addendum)
[facility] [place]  Subjective  - pain is better controlled.   Objective 148/99 87 99.1 F (37.3 C) (Oral) 17 95%  Intake/Output Summary (Last 24 hours) at 10/03/12 0737 Last data filed at 10/03/12 0600  Gross per 24 hour  Intake    720 ml  Output   1900 ml  Net  -1180 ml    Incision is healing well No active drainage  Assessment/Planning: S/P Right AKA Pending SNF placement  [name] [facility] 10/03/2012 7:37 AM --  Laboratory Lab Results: No results found for this basename: WBC, HGB, HCT, PLT,  in the last 72 hours BMET No results found for this basename: NA, K, CL, CO2, GLUCOSE, BUN, CREATININE, CALCIUM,  in the last 72 hours  COAG Lab Results  Component Value Date   INR 0.89 09/27/2012   INR 0.93 09/09/2012   INR 0.94 06/08/2012   No results found for this basename: PTT    Addendum  I have independently interviewed and examined the patient, and I agree with the physician assistant's findings.  Pt ready for rehab in SNF/CIR.  Leonides Sake, MD [facility] [place] Office: [phone] Pager: [phone]  10/03/2012, 8:05 AM

## 2012-10-03 NOTE — Progress Notes (Signed)
Occupational Therapy Treatment Patient Details Name: Javier Quinn MRN: [id number] DOB: 1960-12-24 Today's Date: 10/03/2012 Time: 4098-1191 OT Time Calculation (min): 15 min  OT Assessment / Plan / Recommendation  History of present illness Javier Quinn is a 52 y.o. (February 14, 1960) male who presents with chief complaint: severe right leg pain.  s/p Right superficial femoral artery to peroneal artery bypass with ipsilateral non-reverse greater saphenous vein (Date: 06/15/12) who presents with chief complaint: R leg pain and swelling.    At this point he is in severe pain and has no other revascularization options.  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   OT comments  Pt much improved today with sitting balance and adls.  Pt transferring with min guard to min assist at times.  Pt in too much pain to attempt tub bench transfer but will next session.  Follow Up Recommendations  SNF    Barriers to Discharge       Equipment Recommendations  3 in 1 bedside comode;Tub/shower bench    Recommendations for Other Services    Frequency Min 2X/week   Progress towards OT Goals Progress towards OT goals: Progressing toward goals  Plan Discharge plan needs to be updated    Precautions / Restrictions Precautions Precautions: Fall Precaution Comments: R AKA Restrictions Weight Bearing Restrictions: No   Pertinent Vitals/Pain Pt states when mobilizing pain is a 10/10.    ADL  Eating/Feeding: Independent Where Assessed - Eating/Feeding: Chair Grooming: Performed;Teeth care Where Assessed - Grooming: Supported standing Upper Body Bathing: Supervision/safety Upper Body Dressing: Performed;Set up Where Assessed - Upper Body Dressing: Unsupported sitting Lower Body Dressing: Performed;Minimal assistance Where Assessed - Lower Body Dressing: Unsupported sitting;Supported sit to stand Toilet Transfer: Performed;Minimal assistance Toilet Transfer Method: Stand pivot Toilet Transfer Equipment:  Bedside commode Toileting - Clothing Manipulation and Hygiene: Performed;Min guard Where Assessed - Engineer, mining and Hygiene: Standing Equipment Used: Gait belt;Rolling walker Transfers/Ambulation Related to ADLs: Pt walked in room with walker and min guard to sink and to the chair.  Pt with pain therefore could not walk past sink and back.   ADL Comments: Balance improved today with adls but cont to require some assist when dressing LE.  Pt dressed LE better in recliner with support for balance.    OT Diagnosis:    OT Problem List:   OT Treatment Interventions:     OT Goals(current goals can now be found in the care plan section) Acute Rehab OT Goals Patient Stated Goal: be able to walk OT Goal Formulation: With patient Time For Goal Achievement: 10/13/12 Potential to Achieve Goals: Good ADL Goals Pt Will Perform Grooming: with modified independence;standing Pt Will Perform Upper Body Bathing: with modified independence;sitting Pt Will Perform Lower Body Bathing: with modified independence;sit to/from stand Pt Will Perform Upper Body Dressing: with modified independence;sitting Pt Will Perform Lower Body Dressing: with modified independence;sit to/from stand Pt Will Transfer to Toilet: with modified independence;bedside commode;ambulating Pt Will Perform Toileting - Clothing Manipulation and hygiene: with modified independence;sit to/from stand Pt Will Perform Tub/Shower Transfer: with modified independence;tub bench;ambulating  Visit Information  Last OT Received On: 10/03/12 Assistance Needed: +1 History of Present Illness: Javier Quinn is a 52 y.o. (11/26/1960) male who presents with chief complaint: severe right leg pain.  s/p Right superficial femoral artery to peroneal artery bypass with ipsilateral non-reverse greater saphenous vein (Date: 06/15/12) who presents with chief complaint: R leg pain and swelling.    At this point he is  in severe pain and has no  other revascularization options.  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    Subjective Data      Prior Functioning  Home Living Family/patient expects to be discharged to:: Skilled nursing facility Living Arrangements: Alone Available Help at Discharge: Friend(s) Type of Home: Other(Comment) (motel.  Pt essentially homeless) Additional Comments: Pt stated this monday that he cannot stay with sister.  Prior Function Level of Independence: Independent Communication Communication: No difficulties Dominant Hand: Left    Cognition  Cognition Arousal/Alertness: Awake/alert Behavior During Therapy: WFL for tasks assessed/performed Overall Cognitive Status: Within Functional Limits for tasks assessed    Mobility  Bed Mobility Bed Mobility: Not assessed Details for Bed Mobility Assistance: Pt was on EOB on arrival Transfers Transfers: Sit to Stand;Stand to Sit Sit to Stand: 4: Min guard;From bed Stand to Sit: 4: Min guard;With armrests;To chair/3-in-1 Details for Transfer Assistance: cueing for hand placement, sequence and safety     Exercises      Balance Balance Balance Assessed: Yes Static Sitting Balance Static Sitting - Balance Support: No upper extremity supported;Feet supported Static Sitting - Level of Assistance: 6: Modified independent (Device/Increase time) Static Sitting - Comment/# of Minutes: 3 Dynamic Sitting Balance Dynamic Sitting - Level of Assistance: 4: Min assist (during LE dressing) Static Standing Balance Static Standing - Balance Support: Bilateral upper extremity supported Static Standing - Level of Assistance: 5: Stand by assistance Static Standing - Comment/# of Minutes: 2   End of Session OT - End of Session Equipment Utilized During Treatment: Rolling walker Activity Tolerance: Patient limited by pain Patient left: in chair;with call bell/phone within reach Nurse Communication: Mobility status;Other (comment) (need to have a bowel  movement)  GO     Hope Budds 10/03/2012, 10:44 AM

## 2012-10-03 NOTE — Progress Notes (Signed)
CSW updated patient about bed offers. CSW explained to patient that there are no Medicaid beds available in [place] and CSW will have to expand the SNF search outside of [place]. Patient appeared to be unhappy about this but reluctantly agreed to expand search.  Maree Krabbe, MSW, [facility] [phone]

## 2012-10-04 ENCOUNTER — Other Ambulatory Visit: Payer: Self-pay | Admitting: *Deleted

## 2012-10-04 ENCOUNTER — Telehealth: Payer: Self-pay | Admitting: Vascular Surgery

## 2012-10-04 LAB — GLUCOSE, CAPILLARY
Glucose-Capillary: 168 mg/dL — ABNORMAL HIGH (ref 70–99)
Glucose-Capillary: 133 mg/dL — ABNORMAL HIGH (ref 70–99)

## 2012-10-04 MED ORDER — OXYCODONE HCL 5 MG PO TABS: 5.0000 mg | ORAL_TABLET | ORAL | Status: DC | PRN

## 2012-10-04 NOTE — Progress Notes (Signed)
Clinical Social Work Department CLINICAL SOCIAL WORK PLACEMENT NOTE 10/04/2012  Patient:  Javier Quinn, Javier Quinn  Account Number:  [id number] Admit date:  09/28/2012  Clinical Social Worker:  Carren Rang  Date/time:  09/30/2012 04:38 PM  Clinical Social Work is seeking post-discharge placement for this patient at the following level of care:   SKILLED NURSING   (*CSW will update this form in Epic as items are completed)   09/30/2012  Patient/family provided with [facility] Department of Clinical Social Work's list of facilities offering this level of care within the geographic area requested by the patient (or if unable, by the patient's family).  09/30/2012  Patient/family informed of their freedom to choose among providers that offer the needed level of care, that participate in Medicare, Medicaid or managed care program needed by the patient, have an available bed and are willing to accept the patient.  09/30/2012  Patient/family informed of [facility]' ownership interest in [facility], as well as of the fact that they are under no obligation to receive care at this facility.  PASARR submitted to EDS on 09/29/2012 PASARR number received from [employer] on 09/29/2012  FL2 transmitted to all facilities in geographic area requested by pt/family on  09/30/2012 FL2 transmitted to all facilities within larger geographic area on   Patient informed that his/her managed care company has contracts with or will negotiate with  certain facilities, including the following:     Patient/family informed of bed offers received:  10/03/2012 Patient chooses bed at [facility] Physician recommends and patient chooses bed at    Patient to be transferred to [facility] on  10/04/2012 Patient to be transferred to facility by EMS  The following physician request were entered in Epic:   Additional Comments:  Maree Krabbe, MSW, [employer] [phone]

## 2012-10-04 NOTE — Progress Notes (Addendum)
[facility] [place]  Postoperative Visit - Amputation  Date of Surgery: 09/28/2012 Procedure(s): AMPUTATION ABOVE KNEE Right Surgeon: Surgeon(s): Fransisco Hertz, MD  6 Days Post-Op  Subjective Javier Quinn is a 52 y.o. male who is 6 Days Post-Op. Pt.denies  pain in the stump. The patient notes pain is well controlled. Pt. denies phantom pain.  Significant Diagnostic Studies: CBC Lab Results  Component Value Date   WBC 10.0 09/30/2012   HGB 12.4* 09/30/2012   HCT 36.6* 09/30/2012   MCV 85.7 09/30/2012   PLT 217 09/30/2012    BMET    Component Value Date/Time   NA 135 09/30/2012 0613   K 3.5 09/30/2012 0613   CL 97 09/30/2012 0613   CO2 27 09/30/2012 0613   GLUCOSE 146* 09/30/2012 0613   BUN 7 09/30/2012 0613   CREATININE 0.84 09/30/2012 0613   CALCIUM 8.9 09/30/2012 0613   GFRNONAA >90 09/30/2012 0613   GFRAA >90 09/30/2012 0613    COAG Lab Results  Component Value Date   INR 0.89 09/27/2012   INR 0.93 09/09/2012   INR 0.94 06/08/2012   No results found for this basename: PTT     Intake/Output Summary (Last 24 hours) at 10/04/12 0740 Last data filed at 10/04/12 0658  Gross per 24 hour  Intake    720 ml  Output   1275 ml  Net   -555 ml   No data found.    Physical Examination  BP Readings from Last 3 Encounters:  10/04/12 136/91  10/04/12 136/91  09/27/12 172/118   Temp Readings from Last 3 Encounters:  10/04/12 98.4 F (36.9 C) Oral  10/04/12 98.4 F (36.9 C) Oral  09/27/12 97.3 F (36.3 C) Oral   SpO2 Readings from Last 3 Encounters:  10/04/12 95%  10/04/12 95%  09/27/12 97%   Pulse Readings from Last 3 Encounters:  10/04/12 87  10/04/12 87  09/27/12 84    Pt is A&Ox3  WDWN male with no complaints  Right amputation wound is healing well.  There is good bone coverage in the stump Stump is warm and well perfused, without drainage; without erythema   Assessment/plan:  Javier Quinn is a 52 y.o. male who is s/p Right  Procedure(s): AMPUTATION ABOVE KNEE  The patient's stump is clean, dry, intact or viable.  Follow-up 4 weeks from surgery  Thomasena Edis EMMA MAUREEN 7:40 AM 10/04/2012  Addendum  I have independently interviewed and examined the patient, and I agree with the physician assistant's findings.  Ok to transfer to SNF for rehab.  Will be by to sign any paperwork today after first OR case.  Leonides Sake, MD [facility] [place] Office: [phone] Pager: [phone]  10/04/2012, 7:45 AM

## 2012-10-04 NOTE — Telephone Encounter (Signed)
Message copied by Margaretmary Eddy on Tue Oct 04, 2012  1:51 PM ------      Message from: Melene Plan      Created: Tue Oct 04, 2012 10:03 AM       Will probably need staples removed then      ----- Message -----         From: Lars Mage, PA-C         Sent: 10/04/2012   7:41 AM           To: Melene Plan, RN            F/U with Dr. Imogene Burn in 4 weeks right AKA       ------

## 2012-10-04 NOTE — Telephone Encounter (Signed)
LVM re appt info, sent letter - kf °

## 2012-10-04 NOTE — Progress Notes (Signed)
Pt given Dulcolax supp at this time per pt request; no BM since surgery on 9/24; will cont. To monitor.

## 2012-10-04 NOTE — Discharge Summary (Signed)
[facility] Discharge Summary   Patient ID:  Javier Quinn MRN: [id number] DOB/AGE: November 15, 1960 52 y.o.  Admit date: 09/28/2012 Discharge date: 10/04/2012 Date of Surgery: 09/28/2012 Surgeon: Surgeon(s): Fransisco Hertz, MD  Admission Diagnosis: ISCHEMIC RIGHT LEG  Discharge Diagnoses:  ISCHEMIC RIGHT LEG  Secondary Diagnoses: Past Medical History  Diagnosis Date  . Diabetes mellitus without complication   . Hyperlipidemia   . Tobacco dependence   . GERD (gastroesophageal reflux disease)   . H/O hiatal hernia   . Peripheral vascular disease   . Hypertension     See's Dr Eden Emms States     Procedure(s): AMPUTATION ABOVE KNEE  Discharged Condition: good  HPI:  Javier Quinn is a 52 y.o. (December 26, 1960) male who presents with chief complaint: seveer right leg pain. s/p Right superficial femoral artery to peroneal artery bypass with ipsilateral non-reverse greater saphenous vein (Date: 06/15/12) who presents with chief complaint: R leg pain and swelling. He under went angioplasty of distal bypass and peroneal artery on 09/09/2012 which showed distal by pass occlusion. At this point he is in severe pain and has no other revascularization options. 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    AKA was performed by Dr. Leonides Sake on 09/28/2012.  The patient had significant pain issues and was placed on a PCA with oxycodone for breakthrough pain.  He is homeless and needs rehab.  He will be going to a SNF at discharge today.  He will f/u in our office 4 weeks from surgery for staple removal. His new medications include plavix, oxycodone, and lyrica for pain.   Hospital Course:  Javier Quinn is a 52 y.o. male is S/P Right Procedure(s): AMPUTATION ABOVE KNEE Extubated: POD # 0 Physical exam: incision clean and dry Post-op wounds healing well Pt. Ambulating, voiding and taking PO diet without difficulty. Pt pain controlled with PO pain meds. Labs as  below Complications:see hospital course  Consults:     Significant Diagnostic Studies: CBC Lab Results  Component Value Date   WBC 10.0 09/30/2012   HGB 12.4* 09/30/2012   HCT 36.6* 09/30/2012   MCV 85.7 09/30/2012   PLT 217 09/30/2012    BMET    Component Value Date/Time   NA 135 09/30/2012 0613   K 3.5 09/30/2012 0613   CL 97 09/30/2012 0613   CO2 27 09/30/2012 0613   GLUCOSE 146* 09/30/2012 0613   BUN 7 09/30/2012 0613   CREATININE 0.84 09/30/2012 0613   CALCIUM 8.9 09/30/2012 0613   GFRNONAA >90 09/30/2012 0613   GFRAA >90 09/30/2012 0613   COAG Lab Results  Component Value Date   INR 0.89 09/27/2012   INR 0.93 09/09/2012   INR 0.94 06/08/2012     Disposition:  Discharge to :Skilled nursing facility Discharge Orders   Future Appointments Provider Department Dept Phone   11/16/2012 10:45 AM Santiago Bumpers, DPM [facility] [phone]   02/10/2013 12:00 PM [facility] [place] [facility] [address] [phone]   02/10/2013 12:30 PM [facility] [place] [facility] [address] [phone]   02/10/2013 1:00 PM Fransisco Hertz, MD [facility] [phone]   Future Orders Complete By Expires   Activity as tolerated - No restrictions  As directed    Call MD for:  redness, tenderness, or signs of infection (pain, swelling, bleeding, redness, odor or green/yellow discharge around incision site)  As directed    Call MD for:  severe or increased pain, loss or decreased feeling  in affected limb(s)  As directed    Call MD for:  temperature >100.5  As directed    May shower   As directed    Resume previous diet  As directed        Medication List         albuterol 108 (90 BASE) MCG/ACT inhaler  Commonly known as:  PROVENTIL HFA;VENTOLIN HFA  Inhale 2 puffs into the lungs every 4 (four) hours as needed for wheezing.     clopidogrel 75 MG tablet  Commonly known as:  PLAVIX  Take 1 tablet (75 mg total) by mouth daily  with breakfast.     insulin detemir 100 UNIT/ML injection  Commonly known as:  LEVEMIR  Inject 10 Units into the skin at bedtime.     lisinopril-hydrochlorothiazide 20-12.5 MG per tablet  Commonly known as:  PRINZIDE,ZESTORETIC  Take 1 tablet by mouth daily.     metFORMIN 500 MG 24 hr tablet  Commonly known as:  GLUCOPHAGE XR  Take 2 tablets (1,000 mg total) by mouth daily with breakfast.     oxyCODONE 5 MG immediate release tablet  Commonly known as:  ROXICODONE  Take 2 tablets (10 mg total) by mouth every 4 (four) hours as needed for pain.     oxyCODONE 5 MG immediate release tablet  Commonly known as:  Oxy IR/ROXICODONE  Take 1 tablet (5 mg total) by mouth every 4 (four) hours as needed for pain.     pravastatin 80 MG tablet  Commonly known as:  PRAVACHOL  Take 1 tablet (80 mg total) by mouth daily.       Verbal and written Discharge instructions given to the patient. Wound care per Discharge AVS     Follow-up Information   Follow up with Nilda Simmer, MD In 4 weeks. (sent)    Specialty:  Vascular Surgery   Contact information:   [address] [place] [address] [phone]       Signed: [name] [facility] 10/04/2012, 7:47 AM  Addendum  I have independently interviewed and examined the patient, and I agree with the physician assistant's discharge summary.  This patient under previously a R SFA to peroneal bypass with disadvantaged vein conduit.  The vein conduit developed severe distal stenosis.  While an angioplasty of the distal bypass was partially successful, the patient continued to be symptomatic.  The patient eventually elected to proceed with palliative right above-knee amputation for his residual severe tibial artery disease.  This was completed without difficulty.  The patient has been in the hospital recovering and getting rehab while waiting for SNF placement, as he is homeless.  The plan is to see him in the office in 4 weeks and remove the  staples in his viable R AKA and start the prosthesis fitting process.  Leonides Sake, MD [facility] [place] Office: [phone] Pager: [phone]  10/04/2012, 11:55 AM

## 2012-10-04 NOTE — Progress Notes (Signed)
Clinical Social Worker facilitated patient discharge by contacting the patient, family and facility, [place]. Patient agreeable to this plan and arranging transport via EMS. CSW arranging transportation for 1:pm. CSW will sign off, as social work intervention is no longer needed.  Maree Krabbe, MSW, [facility] [phone]

## 2012-10-07 ENCOUNTER — Non-Acute Institutional Stay (SKILLED_NURSING_FACILITY): Payer: Medicaid Other | Admitting: Adult Health

## 2012-10-07 DIAGNOSIS — I10 Essential (primary) hypertension: Secondary | ICD-10-CM

## 2012-10-07 DIAGNOSIS — E785 Hyperlipidemia, unspecified: Secondary | ICD-10-CM

## 2012-10-07 DIAGNOSIS — I739 Peripheral vascular disease, unspecified: Secondary | ICD-10-CM

## 2012-10-07 DIAGNOSIS — S78111A Complete traumatic amputation at level between right hip and knee, initial encounter: Secondary | ICD-10-CM

## 2012-10-07 DIAGNOSIS — K5900 Constipation, unspecified: Secondary | ICD-10-CM

## 2012-10-07 DIAGNOSIS — E1159 Type 2 diabetes mellitus with other circulatory complications: Secondary | ICD-10-CM

## 2012-10-07 DIAGNOSIS — IMO0002 Reserved for concepts with insufficient information to code with codable children: Secondary | ICD-10-CM

## 2012-10-10 DIAGNOSIS — Z0279 Encounter for issue of other medical certificate: Secondary | ICD-10-CM

## 2012-10-11 ENCOUNTER — Encounter: Payer: Self-pay | Admitting: Adult Health

## 2012-10-11 ENCOUNTER — Non-Acute Institutional Stay (SKILLED_NURSING_FACILITY): Payer: Medicaid Other | Admitting: Internal Medicine

## 2012-10-11 DIAGNOSIS — I739 Peripheral vascular disease, unspecified: Secondary | ICD-10-CM

## 2012-10-11 DIAGNOSIS — K5900 Constipation, unspecified: Secondary | ICD-10-CM | POA: Insufficient documentation

## 2012-10-11 DIAGNOSIS — E1151 Type 2 diabetes mellitus with diabetic peripheral angiopathy without gangrene: Secondary | ICD-10-CM | POA: Insufficient documentation

## 2012-10-11 DIAGNOSIS — E1159 Type 2 diabetes mellitus with other circulatory complications: Secondary | ICD-10-CM

## 2012-10-11 DIAGNOSIS — S78111A Complete traumatic amputation at level between right hip and knee, initial encounter: Secondary | ICD-10-CM

## 2012-10-11 DIAGNOSIS — IMO0002 Reserved for concepts with insufficient information to code with codable children: Secondary | ICD-10-CM

## 2012-10-11 HISTORY — DX: Type 2 diabetes mellitus with other circulatory complications: E11.59

## 2012-10-11 NOTE — Assessment & Plan Note (Signed)
Is stable post right aka; will continue plavix 75 mg daily and will continue with therapy as directed; and will continue to monitor his status

## 2012-10-11 NOTE — Assessment & Plan Note (Signed)
He is status post right aka; has oxycodone 10 mg every 4 hours as needed for pain is being followed by vascular surgery. He is being seen by therapy; will continue his current plan of care and will continue to monitor his progress.

## 2012-10-11 NOTE — Progress Notes (Signed)
Patient ID: Javier Quinn, male   DOB: [birth year], 52 y.o.   MRN: [id number]  [place]  Allergies  Allergen Reactions  . Codeine Other (See Comments)    Causes constipation     Chief Complaint  Patient presents with  . Hospitalization Follow-up    HPI:  He has been hospitalized for a right aka due to an ischemic right leg.  he is here for short term rehab. His goal is to return back home as soon as he is able to do so.    Past Medical History  Diagnosis Date  . Diabetes mellitus without complication   . Hyperlipidemia   . Tobacco dependence   . GERD (gastroesophageal reflux disease)   . H/O hiatal hernia   . Peripheral vascular disease   . Hypertension     See's Dr Javier Quinn     Past Surgical History  Procedure Laterality Date  . Bypass graft femoral-peroneal Right 06/15/2012    Procedure: BYPASS GRAFT Javier Quinn;  Surgeon: Javier Hertz, MD;  Location: [facility] OR;  Service: Vascular;  Laterality: Right;  using right non-reversed saphenous vein  . Lower extremity angiogram Right 06/15/2012    Procedure: LOWER EXTREMITY ANGIOGRAM;  Surgeon: Javier Hertz, MD;  Location: [facility] OR;  Service: Vascular;  Laterality: Right;  . Lower extremity angiogram Right 09/09/2012    Procedure: LOWER EXTREMITY ANGIOGRAM With Agioplasty Bypass Graft;  Surgeon: Javier Hertz, MD;  Location: [facility] OR;  Service: Vascular;  Laterality: Right;  . Amputation Right 09/28/2012    Procedure: AMPUTATION ABOVE KNEE;  Surgeon: Javier Hertz, MD;  Location: [facility] OR;  Service: Vascular;  Laterality: Right;    VITAL SIGNS BP 135/82  Pulse 82  Ht 6\' 1"  (1.854 m)  Wt 210 lb (95.255 kg)  BMI 27.71 kg/m2   Patient's Medications  New Prescriptions   No medications on file  Previous Medications   ALBUTEROL (PROVENTIL HFA;VENTOLIN HFA) 108 (90 BASE) MCG/ACT INHALER    Inhale 2 puffs into the lungs every 4 (four) hours as needed for wheezing.   ATORVASTATIN (LIPITOR) 10 MG TABLET    Take 10 mg by mouth daily.   CLOPIDOGREL (PLAVIX) 75 MG TABLET    Take 1 tablet (75 mg total) by mouth daily with breakfast.   DOCUSATE SODIUM (COLACE) 100 MG CAPSULE    Take 100 mg by mouth daily.   INSULIN ASPART (NOVOLOG) 100 UNIT/ML INJECTION    Inject 1-9 Units into the skin 3 (three) times daily with meals. SSI   INSULIN DETEMIR (LEVEMIR) 100 UNIT/ML INJECTION    Inject 10 Units into the skin at bedtime.   LISINOPRIL-HYDROCHLOROTHIAZIDE (PRINZIDE,ZESTORETIC) 20-12.5 MG PER TABLET    Take 1 tablet by mouth daily.   OXYCODONE (ROXICODONE) 5 MG IMMEDIATE RELEASE TABLET    Take 2 tablets (10 mg total) by mouth every 4 (four) hours as needed for pain.  Modified Medications   Modified Medication Previous Medication   METFORMIN (GLUCOPHAGE-XR) 500 MG 24 HR TABLET metFORMIN (GLUCOPHAGE XR) 500 MG 24 hr tablet      Take 500 mg by mouth daily with breakfast.    Take 2 tablets (1,000 mg total) by mouth daily with breakfast.  Discontinued Medications   OXYCODONE (OXY IR/ROXICODONE) 5 MG IMMEDIATE RELEASE TABLET    Take 1 tablet (5 mg total) by mouth every 4 (four) hours as needed for pain.   PRAVASTATIN (PRAVACHOL) 80 MG TABLET    Take 1 tablet (80 mg total) by mouth  daily.    SIGNIFICANT DIAGNOSTIC EXAMS  None recent   LABS REVIEWED:   06-23-12: chol 143; ldl 69; trig 181 09-09-12: hgb a1c 7.7 09-29-12; wbc 9.4; hgb 12.7; hct 37.6; mcv 85.1; plt 237; glucose 171; bun 8; creat 0.79; k+3.8; na++133 09-30-12: wbc 10.0; hgb 12.4; hct 36.6; mcv 85.7; plt 217; glucose 146; bun 7; creat 0.84;k+3.5; na++135   Review of Systems  Constitutional: Negative for fever and malaise/fatigue.  Respiratory: Negative for cough and shortness of breath.   Cardiovascular: Negative for chest pain and palpitations.  Gastrointestinal: Negative for heartburn, abdominal pain and constipation.  Musculoskeletal: Negative for myalgias and joint pain.       Pain from amputation managed with current regimen  Skin: Negative.   Neurological: Negative for  headaches.  Psychiatric/Behavioral: Negative for depression. The patient is not nervous/anxious and does not have insomnia.     Physical Exam  Constitutional: He is oriented to person, place, and time. He appears well-developed and well-nourished.  Overweight   Neck: Neck supple. No JVD present.  Cardiovascular: Normal rate.   Respiratory: Effort normal and breath sounds normal. No respiratory distress. He has no wheezes.  GI: Soft. Bowel sounds are normal. He exhibits no distension. There is no tenderness.  Musculoskeletal: Normal range of motion. He exhibits no edema.  Right aka  Neurological: He is alert and oriented to person, place, and time.  Skin: Skin is warm and dry.  Right aka no signs of infection present   Psychiatric: He has a normal mood and affect.      ASSESSMENT/ PLAN:  Type II or unspecified type diabetes mellitus with peripheral circulatory disorders, uncontrolled(250.72) He is presently stable will continue his levemir 10 units nightly; metformin er 500 mg daily; novolog ssi and will continue to monitor his status   Unspecified essential hypertension Is stable will continue lisinopril hct 20/12.5 mg daily and will monitor   Peripheral vascular disease, unspecified Is stable post right aka; will continue plavix 75 mg daily and will continue with therapy as directed; and will continue to monitor his status   Above knee amputation of right lower extremity He is status post right aka; has oxycodone 10 mg every 4 hours as needed for pain is being followed by vascular surgery. He is being seen by therapy; will continue his current plan of care and will continue to monitor his progress.   Dyslipidemia Will continue lipitor 10 mg daily   Constipation Will continue colace daily    Time spent with patient 50 minutes.

## 2012-10-11 NOTE — Assessment & Plan Note (Signed)
He is presently stable will continue his levemir 10 units nightly; metformin er 500 mg daily; novolog ssi and will continue to monitor his status

## 2012-10-11 NOTE — Assessment & Plan Note (Signed)
Will continue colace daily

## 2012-10-11 NOTE — Progress Notes (Signed)
Patient ID: Javier Quinn, male   DOB: [birth year], 52 y.o.   MRN: [id number]  Facility; [name] Chief complaint; admission to SNF post admit to [facility] from September 24 to September 30  History;   This is a 52 year old man who presented with severe right leg pain. He had been post a right superficial femoral artery to peroneal artery bypass. He underwent angioplasty of the distal bypass and peroneal artery on September 5 which showed the distal bypass occluded. At the point of his current presentation he was in severe pain and there was no other revascularization options. He was therefore admitted for a right above-knee amputation. Patient tells me he was only discovered to be a diabetic during this hospitalization  Past Medical History  Diagnosis Date  . Diabetes mellitus without complication   . Hyperlipidemia   . Tobacco dependence   . GERD (gastroesophageal reflux disease)   . H/O hiatal hernia   . Peripheral vascular disease   . Hypertension     See's Dr Riki Sheer    Past Surgical History  Procedure Laterality Date  . Bypass graft femoral-peroneal Right 06/15/2012    Procedure: BYPASS GRAFT [place];  Surgeon: Fransisco Hertz, MD;  Location: [facility] OR;  Service: Vascular;  Laterality: Right;  using right non-reversed saphenous vein  . Lower extremity angiogram Right 06/15/2012    Procedure: LOWER EXTREMITY ANGIOGRAM;  Surgeon: Fransisco Hertz, MD;  Location: [facility] OR;  Service: Vascular;  Laterality: Right;  . Lower extremity angiogram Right 09/09/2012    Procedure: LOWER EXTREMITY ANGIOGRAM With Agioplasty Bypass Graft;  Surgeon: Fransisco Hertz, MD;  Location[facility] OR;  Service: Vascular;  Laterality: Right;  . Amputation Right 09/28/2012    Procedure: AMPUTATION ABOVE KNEE;  Surgeon: Fransisco Hertz, MD;  Location: [facility] OR;  Service: Vascular;  Laterality: Right;   Medications:  albuterol 2 puffs every 4 when necessary, Plavix 75 daily, Levemir 10 units at bedtime,  lisinopril/hydrochlorothiazide 20/12.5 daily, Glucophage XR thousand milligrams daily, oxycodone 5 mg 2 tablets by mouth every 4 when necessary, Pravachol 1 tablet 80 mg by mouth daily  Social; the patient tells me that he was homeless before entering the hospital. He had been recently living with his father however his father passed away.  reports that he quit smoking about 6 months ago. His smoking use included Cigarettes. He has a 10 pack-year smoking history. He has quit using smokeless tobacco. He reports that he does not drink alcohol or use illicit drugs.  family history includes Cancer in his father and mother. States he had a sister with diabetes  Review of systems Respiratory no cough or shortness of breath Cardiac no exertional chest pain or palpitations GI no abdominal pain nausea or vomiting GU no dysuria no frequency   Physical exam Pulse 70 respirations 18 O2 sat 94% on room air Gen. he is not in any distress HEENT few few teeth no oral lesions Respiratory shallow but otherwise clear entry bilaterally no wheezing work of breathing is normal Cardiac heart sounds are normal no murmurs no bruit Abdomen; no liver spleen or masses are noted GU no bladder distention  Extremities; proximal right up above knee amputation site is well-healed suture still in place. Left leg I cannot feel his pulses even the femoral nevertheless there is no open areas. He is complaining of pain in his left great toe of uncertain etiology. 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 I wonder if he could get by with just metformin #3 hypertension #4 history of a hiatal hernia #5 hyperlipidemia  I doubt this patient will be able to do insulin reliably if he leaves this facility. I  can't see a good reason why we can't change him pure oral agents however I believe this for another visit to.

## 2012-10-11 NOTE — Assessment & Plan Note (Signed)
Will continue lipitor 10 mg daily  

## 2012-10-11 NOTE — Assessment & Plan Note (Signed)
Is stable will continue lisinopril hct 20/12.5 mg daily and will monitor

## 2012-10-25 ENCOUNTER — Non-Acute Institutional Stay (SKILLED_NURSING_FACILITY): Payer: Medicaid Other | Admitting: Internal Medicine

## 2012-10-25 DIAGNOSIS — G547 Phantom limb syndrome without pain: Secondary | ICD-10-CM

## 2012-10-25 DIAGNOSIS — G546 Phantom limb syndrome with pain: Secondary | ICD-10-CM

## 2012-10-25 NOTE — Progress Notes (Signed)
Patient ID: Javier Quinn, male   DOB: August 05, 1960, 52 y.o.   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 He tells me is a type II diabetic it just started on insulin prior to this. As opposed to my initial notes it appears that he was on insulin prior to his hospitalization. He is now describing Phantom limb discomfort in the right leg that makes it difficult for him to sleep at night. He states the oxycodone at 10 mg provides him with about half an hour worth of relief  On examination; Right stump; there is no reason to believe that this is anything but Phantom limb discomfort. He does not have any burning dysesthesias in his left leg and although I have no doubt there is probably some diabetic neuropathy here I think this is really classic Phantom limb description. Don't believe there is any ongoing ischemia of the stump. There is certainly no evidence of infection here  Impression/plan #1 Phantom limb pain this has not been sensitive to the narcotics he has prescribed for his acute postoperative pain. I am not sure whether Neurontin or Lyrica has been consistently effective in this area. We'll try Neurontin at night as this seems to be when he is most consistently effected and bothered making it difficult for him to sleep

## 2012-10-27 ENCOUNTER — Encounter: Payer: Self-pay | Admitting: Vascular Surgery

## 2012-10-28 ENCOUNTER — Ambulatory Visit (INDEPENDENT_AMBULATORY_CARE_PROVIDER_SITE_OTHER): Payer: Self-pay | Admitting: Vascular Surgery

## 2012-10-28 ENCOUNTER — Encounter: Payer: Self-pay | Admitting: Vascular Surgery

## 2012-10-28 VITALS — BP 132/93 | HR 97 | Temp 98.0°F | Ht 73.0 in | Wt 210.0 lb

## 2012-10-28 DIAGNOSIS — I739 Peripheral vascular disease, unspecified: Secondary | ICD-10-CM

## 2012-10-28 DIAGNOSIS — I998 Other disorder of circulatory system: Secondary | ICD-10-CM

## 2012-10-28 DIAGNOSIS — I999 Unspecified disorder of circulatory system: Secondary | ICD-10-CM

## 2012-10-28 DIAGNOSIS — Z48812 Encounter for surgical aftercare following surgery on the circulatory system: Secondary | ICD-10-CM

## 2012-10-28 NOTE — Progress Notes (Signed)
[facility] [place]  Postoperative Visit  History of Present Illness  Javier Quinn is a 52 y.o. male who presents for postoperative follow-up for: Right above-the-knee amputation (Date: 09/28/12).  The patient's wounds are healed.  The patient notes pain is well controlled except for phantom pain.  The patient's current symptoms are: phantom pain which is somewhat improved from post-op.  For VQI Use Only  PRE-ADM LIVING: Nursing home  AMB STATUS: Wheelchair  Physical Examination  Filed Vitals:   10/28/12 1050  BP: 132/93  Pulse: 97  Temp: 98 F (36.7 C)   RLE: Incision is healed.  Staples are intact.  Medical Decision Making  Javier Quinn is a 52 y.o. male who presents s/p right above-the-knee ampuation.  The patient's stump is healing appropriately with resolution of pre-operative symptoms. I discussed in depth with the patient the nature of atherosclerosis, and emphasized the importance of maximal medical management including strict control of blood pressure, blood glucose, and lipid levels, obtaining regular exercise, and cessation of smoking.  The patient is aware that without maximal medical management the underlying atherosclerotic disease process will progress, possibly leading to a more proximal amputation. The patient agrees to participate in their maximal medical care.  Thank you for allowing [place] to participate in this patient's care.  The patient has been referred for prosthetic fitting.  The patient will follow up in 3 months for LLE ABI   Leonides Sake, MD [facility] [place] Office: [phone] Pager: [phone]  10/28/2012, 11:04 AM

## 2012-10-31 ENCOUNTER — Other Ambulatory Visit: Payer: Self-pay | Admitting: *Deleted

## 2012-11-16 ENCOUNTER — Ambulatory Visit (INDEPENDENT_AMBULATORY_CARE_PROVIDER_SITE_OTHER): Payer: Medicaid Other | Admitting: Podiatry

## 2012-11-16 ENCOUNTER — Encounter: Payer: Self-pay | Admitting: Podiatry

## 2012-11-16 VITALS — BP 131/64 | HR 81 | Resp 16 | Ht 72.0 in | Wt 212.0 lb

## 2012-11-16 DIAGNOSIS — M79609 Pain in unspecified limb: Secondary | ICD-10-CM

## 2012-11-16 DIAGNOSIS — B351 Tinea unguium: Secondary | ICD-10-CM

## 2012-11-16 NOTE — Progress Notes (Signed)
Patient ID: Javier Quinn, male   DOB: Jul 10, 1960, 52 y.o.   MRN: [id number] Subjective: Patient presents requesting debridement of painful left toenails. Since visit of 08/22/2012 patient has above-the-knee amputation right. He is a diabetic with peripheral arterial disease.  Objective: Patient transfers from wheelchair to treatment chair. Right above knee amputation noted. The left foot demonstrates brown brittle hypertrophic toenails with palpable tenderness. HAV deformity left noted. No open sores noted. No calluses or corns noted.  Assessment: Symptomatic onychomycoses x5 left foot Peripheral arterial disease  Plan: Nails x5 are debrided on the left foot without any bleeding. Reappoint at three-month intervals.  Richard C.Leeanne Deed, DPM

## 2012-11-16 NOTE — Patient Instructions (Signed)
Return at three-month intervals for nail debridement. Suggest a shoe with a more rigid heel counter as a running shoe.

## 2012-11-18 ENCOUNTER — Non-Acute Institutional Stay (SKILLED_NURSING_FACILITY): Payer: Medicaid Other | Admitting: Adult Health

## 2012-11-18 ENCOUNTER — Encounter: Payer: Self-pay | Admitting: Adult Health

## 2012-11-18 DIAGNOSIS — E1159 Type 2 diabetes mellitus with other circulatory complications: Secondary | ICD-10-CM

## 2012-11-18 DIAGNOSIS — I10 Essential (primary) hypertension: Secondary | ICD-10-CM

## 2012-11-18 DIAGNOSIS — G546 Phantom limb syndrome with pain: Secondary | ICD-10-CM | POA: Insufficient documentation

## 2012-11-18 DIAGNOSIS — I70219 Atherosclerosis of native arteries of extremities with intermittent claudication, unspecified extremity: Secondary | ICD-10-CM

## 2012-11-18 DIAGNOSIS — G547 Phantom limb syndrome without pain: Secondary | ICD-10-CM

## 2012-11-18 DIAGNOSIS — H409 Unspecified glaucoma: Secondary | ICD-10-CM | POA: Insufficient documentation

## 2012-11-18 DIAGNOSIS — IMO0002 Reserved for concepts with insufficient information to code with codable children: Secondary | ICD-10-CM

## 2012-11-18 DIAGNOSIS — G4700 Insomnia, unspecified: Secondary | ICD-10-CM

## 2012-11-18 DIAGNOSIS — E785 Hyperlipidemia, unspecified: Secondary | ICD-10-CM

## 2012-11-18 DIAGNOSIS — S78111A Complete traumatic amputation at level between right hip and knee, initial encounter: Secondary | ICD-10-CM

## 2012-11-18 MED ORDER — GABAPENTIN 300 MG PO CAPS: 300.0000 mg | ORAL_CAPSULE | Freq: Three times a day (TID) | ORAL | Status: DC

## 2012-11-18 NOTE — Progress Notes (Signed)
Patient ID: Javier Quinn, male   DOB: 11-Oct-1960, 52 y.o.   MRN: [id number]  [place]  Allergies  Allergen Reactions  . Codeine Other (See Comments)    Causes constipation    Chief Complaint  Patient presents with  . Medical Managment of Chronic Issues    HPI  He is being seen today for his chronic illnesses. He is continuing to have phantom pain in his right lower extremity for which he has had an aka. He states the pain is getting worse over time. He continues to participate in therapy. He does have neuropathy in his left lower extremity and states he has numbness and difficulty standing on his left leg for long periods of time.    Past Medical History  Diagnosis Date  . Diabetes mellitus without complication   . Hyperlipidemia   . Tobacco dependence   . GERD (gastroesophageal reflux disease)   . H/O hiatal hernia   . Peripheral vascular disease   . Hypertension     See's Dr Eden Emms States   . PVD (peripheral vascular disease) with claudication 05/18/2012  . Atherosclerotic peripheral vascular disease with intermittent claudication 05/18/2012  . Unspecified essential hypertension 05/18/2012  . Type II or unspecified type diabetes mellitus with peripheral circulatory disorders, uncontrolled(250.72) 10/11/2012  . Dyslipidemia 08/10/2012    Past Surgical History  Procedure Laterality Date  . Bypass graft femoral-peroneal Right 06/15/2012    Procedure: BYPASS GRAFT [place] Neighbor;  Surgeon: Fransisco Hertz, MD;  Location: [facility] OR;  Service: Vascular;  Laterality: Right;  using right non-reversed saphenous vein  . Lower extremity angiogram Right 06/15/2012    Procedure: LOWER EXTREMITY ANGIOGRAM;  Surgeon: Fransisco Hertz, MD;  Location: [facility] OR;  Service: Vascular;  Laterality: Right;  . Lower extremity angiogram Right 09/09/2012    Procedure: LOWER EXTREMITY ANGIOGRAM With Agioplasty Bypass Graft;  Surgeon: Fransisco Hertz, MD;  Location: [facility] OR;  Service: Vascular;  Laterality: Right;  .  Amputation Right 09/28/2012    Procedure: AMPUTATION ABOVE KNEE;  Surgeon: Fransisco Hertz, MD;  Location: [facility] OR;  Service: Vascular;  Laterality: Right;    Filed Vitals:   11/18/12 1142  BP: 119/72  Pulse: 82  Height: 5\' 5"  (1.651 m)  Weight: 212 lb (96.163 kg)    Current Outpatient Prescriptions on File Prior to Visit  Medication Sig Dispense Refill  . albuterol (PROVENTIL HFA;VENTOLIN HFA) 108 (90 BASE) MCG/ACT inhaler Inhale 2 puffs into the lungs every 4 (four) hours as needed for wheezing.  1 each  0  . atorvastatin (LIPITOR) 10 MG tablet Take 40 mg by mouth daily.       . clopidogrel (PLAVIX) 75 MG tablet Take 1 tablet (75 mg total) by mouth daily with breakfast.  30 tablet  6  . docusate sodium (COLACE) 100 MG capsule Take 100 mg by mouth daily.      [name] gabapentin (NEURONTIN) 300 MG capsule Take 300 mg by mouth at bedtime.      . insulin detemir (LEVEMIR) 100 UNIT/ML injection Inject 10 Units into the skin at bedtime.      . insulin lispro (HUMALOG) 100 UNIT/ML injection Inject 100 Units into the skin 3 (three) times daily before meals.      [name] lisinopril-hydrochlorothiazide (PRINZIDE,ZESTORETIC) 20-12.5 MG per tablet Take 1 tablet by mouth daily.  30 tablet  4  . Melatonin 3 MG TABS Take 1 tablet by mouth at bedtime.      . metFORMIN (GLUCOPHAGE-XR) 500 MG 24  hr tablet Take 1,000 mg by mouth daily with breakfast.       . oxyCODONE (ROXICODONE) 5 MG immediate release tablet Take 2 tablets (10 mg total) by mouth every 4 (four) hours as needed for pain.  30 tablet  0   No current facility-administered medications on file prior to visit.      SIGNIFICANT DIAGNOSTIC EXAMS  None recent   LABS REVIEWED:   06-23-12: chol 143; ldl 69; trig 181 09-09-12: hgb a1c 7.7 09-29-12; wbc 9.4; hgb 12.7; hct 37.6; mcv 85.1; plt 237; glucose 171; bun 8; creat 0.79; k+3.8; na++133 09-30-12: wbc 10.0; hgb 12.4; hct 36.6; mcv 85.7; plt 217; glucose 146; bun 7; creat 0.84;k+3.5; na++135 10-12-12: wbc  7.9; hgb 13.2; hct 38.2; mcv 82.3; plt 482; glucose 237; bun 15; creat 0.91; k+4.3; na++137; hgb a1c 7.9; chol 148; ldl 62; trig 213    Review of Systems  Constitutional: Negative for fever and malaise/fatigue.  Respiratory: Negative for cough and shortness of breath.   Cardiovascular: Negative for chest pain and palpitations.  Gastrointestinal: Negative for heartburn, abdominal pain and constipation.  Musculoskeletal: Negative for myalgias and joint pain.       Pain due to amputation not managed; has phantom pain  Skin: Negative.   Neurological: Negative for headaches.  Psychiatric/Behavioral: Negative for depression. The patient is not nervous/anxious and does not have insomnia.     Physical Exam  Constitutional: He is oriented to person, place, and time. He appears well-developed and well-nourished.  Overweight   Neck: Neck supple. No JVD present.  Cardiovascular: Normal rate.   Respiratory: Effort normal and breath sounds normal. No respiratory distress. He has no wheezes.  GI: Soft. Bowel sounds are normal. He exhibits no distension. There is no tenderness.  Musculoskeletal: Normal range of motion. He exhibits no edema.  Right aka  Neurological: He is alert and oriented to person, place, and time.  Skin: Skin is warm and dry.  Right aka stump shrinker in place Psychiatric: He has a normal mood and affect.      ASSESSMENT/ PLAN:  1. Phantom pain: will increase his neurontin to 300 mg twice daily for 5 days then increase to three times daily  Will continue oxycodone 10 mg every 4 hours as needed and will monitor his response.  2. Hypertension: is well controlled; will continue lisinopril 20/12.5 mg daily and will monitor his status  3. Diabetes: will continue his metformin xr 1 gm daily;  levemir 10 units daily and will continue humalog sii ac meals and will monitor status  4. Dyslipidemia: will continue lipitor 40 mg daily and will monitor  5. PVD; is status post right  aka; will continue plavix 75 mg daily   6. Insomnia: will continue melatonin 3 mg nightly   7. Glaucoma: he has been started on latanoprost to both eyes daily; will monitor

## 2012-11-22 ENCOUNTER — Non-Acute Institutional Stay (SKILLED_NURSING_FACILITY): Payer: Medicaid Other | Admitting: Internal Medicine

## 2012-11-22 DIAGNOSIS — E1159 Type 2 diabetes mellitus with other circulatory complications: Secondary | ICD-10-CM

## 2012-11-22 DIAGNOSIS — R609 Edema, unspecified: Secondary | ICD-10-CM

## 2012-11-22 NOTE — Progress Notes (Signed)
Patient ID: Javier Quinn, male   DOB: January 01, 1961, 52 y.o.   MRN: [id number] Facility; [name] Chief complaint; poorly controlled blood sugars History; this is a patient who came to [place] in October. He had a right above-knee amputation due to 2 severe distal ischemia in the right leg. He is a type II diabetic on a combination of Levemir 10 units and metformin. Recently his blood sugars have been consistently elevated this morning at 296 however on other days he is in the low to mid 200s at 3 times a day his lipid panel on 10/8 Soden an LDL of 62 his hemoglobin A1c was 7.9  He is also complaining about edema of his remaining left foot however he has not had any pain or trauma.  Review of systems Respiratory; no cough no sputum Cardiac no exertional chest pain no orthopnea Abdomen no liver no spleen no tenderness  Medications; latex 75 daily, lisinopril/hydrochlorothiazide 20/12.5 daily, metformin 1000 every morning, Levemir 10 mg units each bedtime melatonin 3 mg by mouth each bedtime Lipitor 40 by mouth daily, Neurontin 300 mg by mouth daily  Physical examination; Respiratory clear entry bilaterally Cardiac heart sounds are normal there is no murmurs. No gallops Abdomen no liver no spleen no tenderness Extremities; left foot has some edema although this is fairly mild  Impression/plan #1 type 2 diabetes with macrovascular disease;  Blood sugars are poorly controlled. He is only on once daily metformin I think this was supposed to be the XR variant of Glucophage however looking at his record I am not sure that is the case. He will clearly need more Levemir. At one point in time I thought this man could get by on simply oral agents although I increase and we don't think this is true. He has a normal BUN and creatinine. #2 mild edema of his left foot. This is really trivial at this point and MetroFlex venous insufficiency more than anything else. All simply monitor this for now

## 2012-11-28 DIAGNOSIS — Z0279 Encounter for issue of other medical certificate: Secondary | ICD-10-CM

## 2012-12-20 ENCOUNTER — Other Ambulatory Visit: Payer: Self-pay | Admitting: Gastroenterology

## 2012-12-26 ENCOUNTER — Non-Acute Institutional Stay (SKILLED_NURSING_FACILITY): Payer: Medicaid Other | Admitting: Nurse Practitioner

## 2012-12-26 DIAGNOSIS — E785 Hyperlipidemia, unspecified: Secondary | ICD-10-CM

## 2012-12-26 DIAGNOSIS — G546 Phantom limb syndrome with pain: Secondary | ICD-10-CM

## 2012-12-26 DIAGNOSIS — I739 Peripheral vascular disease, unspecified: Secondary | ICD-10-CM

## 2012-12-26 DIAGNOSIS — E1159 Type 2 diabetes mellitus with other circulatory complications: Secondary | ICD-10-CM

## 2012-12-26 DIAGNOSIS — G547 Phantom limb syndrome without pain: Secondary | ICD-10-CM

## 2012-12-26 DIAGNOSIS — K5900 Constipation, unspecified: Secondary | ICD-10-CM

## 2012-12-26 DIAGNOSIS — I10 Essential (primary) hypertension: Secondary | ICD-10-CM

## 2012-12-26 NOTE — Progress Notes (Signed)
Patient ID: Javier Quinn, male   DOB: 05-Jun-1960, 52 y.o.   MRN: [id number]    Nursing Home Location:  [facility]   Place of Service: SNF (31)  PCP: Standley Dakins, MD  Allergies  Allergen Reactions  . Codeine Other (See Comments)    Causes constipation    Chief Complaint  Patient presents with  . Medical Managment of Chronic Issues    HPI:  Patient is a 52 y/o male who is a resident of [facility] being seen today for routine follow up; pt with pmh of uncontrolled diabetes whose blood sugars remain elevated despite current treatment, constipation uncontrolled, HTN, Hyperlipidemia, GERD, PVD with claudication.   Review of Systems:  Review of Systems  Constitutional: Negative for malaise/fatigue.  Respiratory: Negative for cough and shortness of breath.   Cardiovascular: Positive for claudication. Negative for chest pain and leg swelling.  Gastrointestinal: Positive for constipation. Negative for heartburn, abdominal pain and diarrhea.  Musculoskeletal: Positive for myalgias (in legs; has phamtom pains).  Skin: Negative.   Neurological: Positive for tingling. Negative for dizziness, weakness and headaches.  Psychiatric/Behavioral: Negative for depression and memory loss. The patient is not nervous/anxious.      Past Medical History  Diagnosis Date  . Diabetes mellitus without complication   . Hyperlipidemia   . Tobacco dependence   . GERD (gastroesophageal reflux disease)   . H/O hiatal hernia   . Peripheral vascular disease   . Hypertension     See's Dr Eden Emms States   . PVD (peripheral vascular disease) with claudication 05/18/2012  . Atherosclerotic peripheral vascular disease with intermittent claudication 05/18/2012  . Unspecified essential hypertension 05/18/2012  . Type II or unspecified type diabetes mellitus with peripheral circulatory disorders, uncontrolled(250.72) 10/11/2012  . Dyslipidemia 08/10/2012   Past Surgical History  Procedure  Laterality Date  . Bypass graft femoral-peroneal Right 06/15/2012    Procedure: BYPASS GRAFT [place] Neighbor;  Surgeon: Fransisco Hertz, MD;  Location: [facility] OR;  Service: Vascular;  Laterality: Right;  using right non-reversed saphenous vein  . Lower extremity angiogram Right 06/15/2012    Procedure: LOWER EXTREMITY ANGIOGRAM;  Surgeon: Fransisco Hertz, MD;  Location: [facility] OR;  Service: Vascular;  Laterality: Right;  . Lower extremity angiogram Right 09/09/2012    Procedure: LOWER EXTREMITY ANGIOGRAM With Agioplasty Bypass Graft;  Surgeon: Fransisco Hertz, MD;  Location: [facility] OR;  Service: Vascular;  Laterality: Right;  . Amputation Right 09/28/2012    Procedure: AMPUTATION ABOVE KNEE;  Surgeon: Fransisco Hertz, MD;  Location: [facility] OR;  Service: Vascular;  Laterality: Right;   Social History:   reports that he quit smoking about 8 months ago. His smoking use included Cigarettes. He has a 10 pack-year smoking history. He has quit using smokeless tobacco. He reports that he does not drink alcohol or use illicit drugs.  Family History  Problem Relation Age of Onset  . Cancer Mother     pancreas  . Cancer Father     Medications: Patient's Medications  New Prescriptions   No medications on file  Previous Medications   ALBUTEROL (PROVENTIL HFA;VENTOLIN HFA) 108 (90 BASE) MCG/ACT INHALER    Inhale 2 puffs into the lungs every 4 (four) hours as needed for wheezing.   ATORVASTATIN (LIPITOR) 10 MG TABLET    Take 40 mg by mouth daily.    CLOPIDOGREL (PLAVIX) 75 MG TABLET    Take 1 tablet (75 mg total) by mouth daily with breakfast.   DOCUSATE SODIUM (COLACE)  100 MG CAPSULE    Take 100 mg by mouth daily.   GABAPENTIN (NEURONTIN) 300 MG CAPSULE    Take 1 capsule (300 mg total) by mouth 3 (three) times daily.   INSULIN DETEMIR (LEVEMIR) 100 UNIT/ML INJECTION    Inject 10 Units into the skin at bedtime.   INSULIN LISPRO (HUMALOG) 100 UNIT/ML INJECTION    Inject 100 Units into the skin 3 (three) times daily before meals.    LATANOPROST (XALATAN) 0.005 % OPHTHALMIC SOLUTION    Place 1 drop into both eyes at bedtime.   LISINOPRIL-HYDROCHLOROTHIAZIDE (PRINZIDE,ZESTORETIC) 20-12.5 MG PER TABLET    Take 1 tablet by mouth daily.   MELATONIN 3 MG TABS    Take 1 tablet by mouth at bedtime.   METFORMIN (GLUCOPHAGE-XR) 500 MG 24 HR TABLET    Take 1,000 mg by mouth daily with breakfast.    OXYCODONE (ROXICODONE) 5 MG IMMEDIATE RELEASE TABLET    Take 2 tablets (10 mg total) by mouth every 4 (four) hours as needed for pain.  Modified Medications   No medications on file  Discontinued Medications   No medications on file     Physical Exam:  Filed Vitals:   12/26/12 1719  BP: 126/92  Pulse: 89  Temp: 97.6 F (36.4 C)  Resp: 20    Physical Exam  Constitutional: He is oriented to person, place, and time and well-developed, well-nourished, and in no distress. No distress.  HENT:  Head: Normocephalic and atraumatic.  Eyes: Conjunctivae and EOM are normal. Pupils are equal, round, and reactive to light.  Neck: Normal range of motion. Neck supple.  Cardiovascular: Normal rate and regular rhythm.   Pulmonary/Chest: Effort normal and breath sounds normal.  Abdominal: Soft. Bowel sounds are normal. He exhibits no distension. There is no tenderness.  Musculoskeletal: He exhibits no edema and no tenderness.  Right AKA  Neurological: He is alert and oriented to person, place, and time.  Skin: Skin is warm and dry. He is not diaphoretic.  Psychiatric: Affect normal.     Labs reviewed: Basic Metabolic Panel:  Recent Labs  45/40/98 1310 09/28/12 1548 09/29/12 0600 09/30/12 0613  NA 136  --  133* 135  K 4.3  --  3.8 3.5  CL 98  --  96 97  CO2 24  --  26 27  GLUCOSE 131*  --  171* 146*  BUN 10  --  8 7  CREATININE 0.83 0.81 0.79 0.84  CALCIUM 10.0  --  8.9 8.9   Liver Function Tests:  Recent Labs  08/04/12 1030 09/09/12 1137 09/27/12 1310  AST 26 34 32  ALT 33 40 30  ALKPHOS 103 91 101  BILITOT 0.4  0.2* 0.2*  PROT 7.5 7.5 7.4  ALBUMIN 4.1 4.4 4.2    Recent Labs  08/04/12 1030  LIPASE 38   No results found for this basename: AMMONIA,  in the last 8760 hours CBC:  Recent Labs  08/04/12 1030  09/20/12 0944  09/28/12 1548 09/29/12 0600 09/30/12 0613  WBC 7.1  < > 7.3  < > 11.8* 9.4 10.0  NEUTROABS 3.5  --  3.6  --   --   --   --   HGB 13.7  < > 13.3  < > 13.2 12.7* 12.4*  HCT 38.6*  < > 38.3*  < > 38.5* 37.6* 36.6*  MCV 84.1  < > 84.7  < > 84.4 85.1 85.7  PLT 282  < > 256  < >  241 237 217  < > = values in this interval not displayed. Cardiac Enzymes:  Recent Labs  09/09/12 2108 09/10/12 0630 09/10/12 1203  CKTOTAL 113 74 93  CKMB 2.5 2.3 2.2  TROPONINI <0.30 <0.30 <0.30   BNP: No components found with this basename: POCBNP,  CBG:  Recent Labs  10/03/12 2106 10/04/12 0703 10/04/12 1138  GLUCAP 182* 168* 133*   TSH:  Recent Labs  04/29/12 1157  TSH 0.856   A1C: Lab Results  Component Value Date   HGBA1C 7.7* 09/09/2012   Lipid Panel:  Recent Labs  04/29/12 1157 06/23/12 0600  CHOL 400* 143  HDL 55 38*  LDLCALC UNABLE TO CALCULATE IF TRIGLYCERIDE OVER 400 mg/dL 69  TRIG 409* 811*  CHOLHDL 7.3 3.8   LABS REVIEWED:  09-09-12: hgb a1c 7.7  09-29-12; wbc 9.4; hgb 12.7; hct 37.6; mcv 85.1; plt 237; glucose 171; bun 8; creat 0.79; k+3.8; na++133  09-30-12: wbc 10.0; hgb 12.4; hct 36.6; mcv 85.7; plt 217; glucose 146; bun 7; creat 0.84;k+3.5; na++135  10-12-12: wbc 7.9; hgb 13.2; hct 38.2; mcv 82.3; plt 482; glucose 237; bun 15; creat 0.91; k+4.3; na++137; hgb a1c 7.9; chol 148; ldl 62; trig 213    Assessment/Plan 1. Type II or unspecified type diabetes mellitus with peripheral circulatory disorders, uncontrolled(250.72) -currently on lantus, SSI and metformin 1000 mg daily, pt is complaint with diet however blood sugars still 160-300 fasting -will increase metformin to 1000mg  BID  2. Unspecified essential hypertension -Patient is stable;  continue current regimen. Will monitor and make changes as necessary.  3. PVD (peripheral vascular disease) -conts on plavix  4. Constipation -will increase colace to twice daily -increase water intake  5. Phantom limb pain -stable; reports still feeling the limb but no pain at this time  6. Dyslipidemia -stable; will cont on Lipitor and follow cholesterol

## 2013-01-06 ENCOUNTER — Other Ambulatory Visit: Payer: Self-pay | Admitting: *Deleted

## 2013-01-06 MED ORDER — OXYCODONE HCL 5 MG PO TABS: 10.0000 mg | ORAL_TABLET | ORAL | Status: DC | PRN

## 2013-01-11 ENCOUNTER — Other Ambulatory Visit: Payer: Self-pay | Admitting: *Deleted

## 2013-01-11 MED ORDER — OXYCODONE HCL 10 MG PO TABS: ORAL_TABLET | ORAL | Status: DC

## 2013-01-30 ENCOUNTER — Encounter: Payer: Self-pay | Admitting: Nurse Practitioner

## 2013-01-30 ENCOUNTER — Non-Acute Institutional Stay (SKILLED_NURSING_FACILITY): Payer: Medicaid Other | Admitting: Nurse Practitioner

## 2013-01-30 DIAGNOSIS — E1159 Type 2 diabetes mellitus with other circulatory complications: Secondary | ICD-10-CM

## 2013-01-30 DIAGNOSIS — I70219 Atherosclerosis of native arteries of extremities with intermittent claudication, unspecified extremity: Secondary | ICD-10-CM

## 2013-01-30 DIAGNOSIS — G546 Phantom limb syndrome with pain: Secondary | ICD-10-CM

## 2013-01-30 DIAGNOSIS — E785 Hyperlipidemia, unspecified: Secondary | ICD-10-CM

## 2013-01-30 DIAGNOSIS — K5900 Constipation, unspecified: Secondary | ICD-10-CM

## 2013-01-30 DIAGNOSIS — G547 Phantom limb syndrome without pain: Secondary | ICD-10-CM

## 2013-01-30 NOTE — Progress Notes (Signed)
Patient ID: Javier Quinn, male   DOB: 12-Feb-1960, 53 y.o.   MRN: [id number]    Nursing Home Location:  [facility] of Service: SNF (55)  PCP: Javier Brakeman, MD  Allergies  Allergen Reactions  . Codeine Other (See Comments)    Causes constipation    Chief Complaint  Patient presents with  . Medical Managment of Chronic Issues    HPI:  Patient is a 53 y/o male who is a resident of [facility] being seen today for routine follow up on chronic conditions; pt with pmh of uncontrolled diabetes whose blood sugars remain elevated despite current treatment, constipation uncontrolled, HTN, Hyperlipidemia, GERD, PVD with claudication.  Pt does not have any complaints ; seeing cardiology this week and vascular next month Nursing does not report any concerns at this time  Review of Systems:  Review of Systems  Constitutional: Negative for malaise/fatigue.  Respiratory: Negative for cough and shortness of breath.   Cardiovascular: Positive for claudication. Negative for chest pain and leg swelling.  Gastrointestinal: Negative for heartburn, abdominal pain, diarrhea and constipation.  Musculoskeletal: Negative for myalgias (in legs; has phamtom pains).  Skin: Negative.   Neurological: Positive for tingling. Negative for dizziness, weakness and headaches.  Psychiatric/Behavioral: Negative for depression and memory loss. The patient is not nervous/anxious.      Physical Exam  Constitutional: He is oriented to person, place, and time and well-developed, well-nourished, and in no distress. No distress.  HENT:  Head: Normocephalic and atraumatic.  Right Ear: External ear normal.  Left Ear: External ear normal.  Nose: Nose normal.  Mouth/Throat: Oropharynx is clear and moist. No oropharyngeal exudate.  Eyes: Conjunctivae and EOM are normal. Pupils are equal, round, and reactive to light.  Neck: Normal range of motion. Neck supple.  Cardiovascular: Normal rate and  regular rhythm.   Pulmonary/Chest: Effort normal and breath sounds normal.  Abdominal: Soft. Bowel sounds are normal. He exhibits no distension. There is no tenderness.  Musculoskeletal: He exhibits edema (trace on left). He exhibits no tenderness.  Right AKA  Neurological: He is alert and oriented to person, place, and time.  Skin: Skin is warm and dry. He is not diaphoretic.  Psychiatric: Affect normal.     Past Medical History  Diagnosis Date  . Diabetes mellitus without complication   . Hyperlipidemia   . Tobacco dependence   . GERD (gastroesophageal reflux disease)   . H/O hiatal hernia   . Peripheral vascular disease   . Hypertension     See's Dr Johnsie Cancel States   . PVD (peripheral vascular disease) with claudication 05/18/2012  . Atherosclerotic peripheral vascular disease with intermittent claudication 05/18/2012  . Unspecified essential hypertension 05/18/2012  . Type II or unspecified type diabetes mellitus with peripheral circulatory disorders, uncontrolled(250.72) 10/11/2012  . Dyslipidemia 08/10/2012   Past Surgical History  Procedure Laterality Date  . Bypass graft femoral-peroneal Right 06/15/2012    Procedure: BYPASS GRAFT Campbell Stall;  Surgeon: Conrad [place], MD;  Location: [facility];  Service: Vascular;  Laterality: Right;  using right non-reversed saphenous vein  . Lower extremity angiogram Right 06/15/2012    Procedure: LOWER EXTREMITY ANGIOGRAM;  Surgeon: Conrad [place], MD;  Location: [facility];  Service: Vascular;  Laterality: Right;  . Lower extremity angiogram Right 09/09/2012    Procedure: LOWER EXTREMITY ANGIOGRAM With Agioplasty Bypass Graft;  Surgeon: Conrad [place], MD;  Location: [facility];  Service: Vascular;  Laterality: Right;  . Amputation Right 09/28/2012  Procedure: AMPUTATION ABOVE KNEE;  Surgeon: Conrad [place], MD;  Location: [facility];  Service: Vascular;  Laterality: Right;   Social History:   reports that he quit smoking about 9 months ago. His smoking use  included Cigarettes. He has a 10 pack-year smoking history. He has quit using smokeless tobacco. He reports that he does not drink alcohol or use illicit drugs.  Family History  Problem Relation Age of Onset  . Cancer Mother     pancreas  . Cancer Father     Medications: Patient's Medications  New Prescriptions   No medications on file  Previous Medications   ALBUTEROL (PROVENTIL HFA;VENTOLIN HFA) 108 (90 BASE) MCG/ACT INHALER    Inhale 2 puffs into the lungs every 4 (four) hours as needed for wheezing.   ATORVASTATIN (LIPITOR) 10 MG TABLET    Take 40 mg by mouth daily.    CLOPIDOGREL (PLAVIX) 75 MG TABLET    Take 1 tablet (75 mg total) by mouth daily with breakfast.   DOCUSATE SODIUM (COLACE) 100 MG CAPSULE    Take 100 mg by mouth 2 (two) times daily.    GABAPENTIN (NEURONTIN) 300 MG CAPSULE    Take 1 capsule (300 mg total) by mouth 3 (three) times daily.   INSULIN DETEMIR (LEVEMIR) 100 UNIT/ML INJECTION    Inject 17 Units into the skin at bedtime.   INSULIN LISPRO (HUMALOG) 100 UNIT/ML INJECTION    Inject 100 Units into the skin 3 (three) times daily before meals.   LATANOPROST (XALATAN) 0.005 % OPHTHALMIC SOLUTION    Place 1 drop into both eyes at bedtime.   LISINOPRIL-HYDROCHLOROTHIAZIDE (PRINZIDE,ZESTORETIC) 20-12.5 MG PER TABLET    Take 1 tablet by mouth daily.   MELATONIN 3 MG TABS    Take 1 tablet by mouth at bedtime.   METFORMIN (GLUCOPHAGE-XR) 500 MG 24 HR TABLET    Take 1,000 mg by mouth 2 (two) times daily.    OXYCODONE (ROXICODONE) 5 MG IMMEDIATE RELEASE TABLET    Take 2 tablets (10 mg total) by mouth every 4 (four) hours as needed.   OXYCODONE HCL 10 MG TABS    Take one tablet by mouth every 4 hours as needed for pain  Modified Medications   No medications on file  Discontinued Medications   No medications on file     Filed Vitals:   01/30/13 1148  BP: 147/88  Pulse: 90  Temp: 99 F (37.2 C)  Resp: 18      Labs reviewed: Basic Metabolic Panel:  Recent  Labs  09/27/12 1310 09/28/12 1548 09/29/12 0600 09/30/12 0613  NA 136  --  133* 135  K 4.3  --  3.8 3.5  CL 98  --  96 97  CO2 24  --  26 27  GLUCOSE 131*  --  171* 146*  BUN 10  --  8 7  CREATININE 0.83 0.81 0.79 0.84  CALCIUM 10.0  --  8.9 8.9   Liver Function Tests:  Recent Labs  08/04/12 1030 09/09/12 1137 09/27/12 1310  AST 26 34 32  ALT 33 40 30  ALKPHOS 103 91 101  BILITOT 0.4 0.2* 0.2*  PROT 7.5 7.5 7.4  ALBUMIN 4.1 4.4 4.2    Recent Labs  08/04/12 1030  LIPASE 38   No results found for this basename: AMMONIA,  in the last 8760 hours CBC:  Recent Labs  08/04/12 1030  09/20/12 0944  09/28/12 1548 09/29/12 0600 09/30/12 0613  WBC 7.1  < >  7.3  < > 11.8* 9.4 10.0  NEUTROABS 3.5  --  3.6  --   --   --   --   HGB 13.7  < > 13.3  < > 13.2 12.7* 12.4*  HCT 38.6*  < > 38.3*  < > 38.5* 37.6* 36.6*  MCV 84.1  < > 84.7  < > 84.4 85.1 85.7  PLT 282  < > 256  < > 241 237 217  < > = values in this interval not displayed. Cardiac Enzymes:  Recent Labs  09/09/12 2108 09/10/12 0630 09/10/12 1203  CKTOTAL 113 74 93  CKMB 2.5 2.3 2.2  TROPONINI <0.30 <0.30 <0.30   BNP: No components found with this basename: POCBNP,  CBG:  Recent Labs  10/03/12 2106 10/04/12 0703 10/04/12 1138  GLUCAP 182* 168* 133*   TSH:  Recent Labs  04/29/12 1157  TSH 0.856   A1C: Lab Results  Component Value Date   HGBA1C 7.7* 09/09/2012   Lipid Panel:  Recent Labs  04/29/12 1157 06/23/12 0600  CHOL 400* 143  HDL 55 38*  LDLCALC UNABLE TO CALCULATE IF TRIGLYCERIDE OVER 400 mg/dL 69  TRIG 608* 181*  CHOLHDL 7.3 3.8    09-09-12: hgb a1c 7.7  09-29-12; wbc 9.4; hgb 12.7; hct 37.6; mcv 85.1; plt 237; glucose 171; bun 8; creat 0.79; k+3.8; na++133  09-30-12: wbc 10.0; hgb 12.4; hct 36.6; mcv 85.7; plt 217; glucose 146; bun 7; creat 0.84;k+3.5; na++135  10-12-12: wbc 7.9; hgb 13.2; hct 38.2; mcv 82.3; plt 482; glucose 237; bun 15; creat 0.91; k+4.3; na++137; hgb a1c  7.9; chol 148; ldl 62; trig 213   Assessment/Plan 1. Atherosclerotic peripheral vascular disease with intermittent claudication -unchanged; with right AKA and left leg vascular disease; follows up with vascular next month  2. Type II or unspecified type diabetes mellitus with peripheral circulatory disorders, uncontrolled(250.72) -post prandial blood sugars are within appropriate range but fasting still elevated  -metformin increased last month- will follow up BUN/Cr today -fasting blood sugars 140-180s -will increase lantus at this time to 20 units qhs  3. Phantom limb pain -stable at this time  4. Constipation -Patient is stable; continue current regimen. Will monitor and make changes as necessary.  5. Dyslipidemia -elevated trigs however with uncontrolled diabetes and question if this was fasting; LDL at goal; will follow up fasting lipids at this time - conts on liptor   Labs/tests ordered Fasting lipids, CMP

## 2013-02-03 ENCOUNTER — Encounter (HOSPITAL_COMMUNITY): Payer: Medicaid Other

## 2013-02-03 ENCOUNTER — Ambulatory Visit: Payer: Medicaid Other | Admitting: Vascular Surgery

## 2013-02-09 ENCOUNTER — Encounter: Payer: Self-pay | Admitting: Vascular Surgery

## 2013-02-10 ENCOUNTER — Ambulatory Visit: Payer: No Typology Code available for payment source | Admitting: Vascular Surgery

## 2013-02-10 ENCOUNTER — Inpatient Hospital Stay (HOSPITAL_COMMUNITY): Admission: RE | Admit: 2013-02-10 | Payer: Medicaid Other | Source: Ambulatory Visit

## 2013-02-10 ENCOUNTER — Ambulatory Visit: Payer: Self-pay | Admitting: Vascular Surgery

## 2013-02-10 ENCOUNTER — Other Ambulatory Visit (HOSPITAL_COMMUNITY): Payer: Medicaid Other

## 2013-02-10 ENCOUNTER — Encounter (HOSPITAL_COMMUNITY): Payer: Medicaid Other

## 2013-02-10 ENCOUNTER — Inpatient Hospital Stay (HOSPITAL_COMMUNITY): Admission: RE | Admit: 2013-02-10 | Payer: PRIVATE HEALTH INSURANCE | Source: Ambulatory Visit

## 2013-02-16 ENCOUNTER — Encounter: Payer: Self-pay | Admitting: Vascular Surgery

## 2013-02-17 ENCOUNTER — Encounter: Payer: Self-pay | Admitting: Vascular Surgery

## 2013-02-17 ENCOUNTER — Ambulatory Visit (INDEPENDENT_AMBULATORY_CARE_PROVIDER_SITE_OTHER): Payer: Medicaid Other | Admitting: Vascular Surgery

## 2013-02-17 ENCOUNTER — Ambulatory Visit (INDEPENDENT_AMBULATORY_CARE_PROVIDER_SITE_OTHER)
Admission: RE | Admit: 2013-02-17 | Discharge: 2013-02-17 | Disposition: A | Discharge status: Home / Self Care | Payer: Medicaid Other | Source: Ambulatory Visit | Attending: Vascular Surgery | Admitting: Vascular Surgery

## 2013-02-17 ENCOUNTER — Ambulatory Visit (HOSPITAL_COMMUNITY)
Admission: RE | Admit: 2013-02-17 | Discharge: 2013-02-17 | Disposition: A | Discharge status: Home / Self Care | Payer: Medicaid Other | Source: Ambulatory Visit | Attending: Vascular Surgery | Admitting: Vascular Surgery

## 2013-02-17 VITALS — BP 133/73 | HR 80 | Ht 65.0 in | Wt 212.0 lb

## 2013-02-17 DIAGNOSIS — Z48812 Encounter for surgical aftercare following surgery on the circulatory system: Secondary | ICD-10-CM

## 2013-02-17 DIAGNOSIS — I10 Essential (primary) hypertension: Secondary | ICD-10-CM | POA: Insufficient documentation

## 2013-02-17 DIAGNOSIS — I70229 Atherosclerosis of native arteries of extremities with rest pain, unspecified extremity: Secondary | ICD-10-CM

## 2013-02-17 DIAGNOSIS — I999 Unspecified disorder of circulatory system: Secondary | ICD-10-CM

## 2013-02-17 DIAGNOSIS — I70209 Unspecified atherosclerosis of native arteries of extremities, unspecified extremity: Secondary | ICD-10-CM | POA: Insufficient documentation

## 2013-02-17 DIAGNOSIS — E785 Hyperlipidemia, unspecified: Secondary | ICD-10-CM | POA: Insufficient documentation

## 2013-02-17 DIAGNOSIS — E119 Type 2 diabetes mellitus without complications: Secondary | ICD-10-CM | POA: Insufficient documentation

## 2013-02-17 DIAGNOSIS — I739 Peripheral vascular disease, unspecified: Secondary | ICD-10-CM

## 2013-02-17 DIAGNOSIS — S78119A Complete traumatic amputation at level between unspecified hip and knee, initial encounter: Secondary | ICD-10-CM | POA: Insufficient documentation

## 2013-02-17 DIAGNOSIS — I998 Other disorder of circulatory system: Secondary | ICD-10-CM

## 2013-02-17 NOTE — Progress Notes (Addendum)
    Established Critical Limb Ischemia Patient  History of Present Illness  Javier Quinn is a 53 y.o. (1960-03-15) male s/p R AKA who presents with chief complaint: numbness in left foot.  The patient has no rest pain and no wounds at this point.  The patient does not ambulate that much with his R AKA prosthesis due to poor fit.  His phantom pain has improved.  He has notice increased L leg swelling since his mobility has decreased.  The patient's treatment regimen currently included: maximal medical management.  The patient's PMH, PSH, SH, FamHx, Med, and Allergies are unchanged from 10/28/12.  On ROS today: no rest pain, no gangrene, no ulcers  Physical Examination  Filed Vitals:   02/17/13 1138  BP: 133/73  Pulse: 80  Height: 5\' 5"  (1.651 m)  Weight: 212 lb (96.163 kg)  SpO2: 99%   Body mass index is 35.28 kg/(m^2).  General: A&O x 3, WDWN  Eyes: PERRLA, EOMI  Pulmonary: Sym exp, good air movt, CTAB, no rales, rhonchi, & wheezing  Cardiac: RRR, Nl S1, S2, no Murmurs, rubs or gallops  Vascular: Vessel Right Left  Radial Palpable Palpable  Brachial Palpable Palpable  Carotid Palpable, without bruit Palpable, without bruit  Aorta Not palpable N/A  Femoral Palpable Palpable  Popliteal AKA Not palpable  PT AKA Not Palpable  DP AKA Not Palpable   Gastrointestinal: soft, NTND, -G/R, - HSM, - masses, - CVAT B  Musculoskeletal: M/S 5/5 throughout , Extremities without ischemic changes , L foot viable , L foot room temp., no gangrene or ulcers  Neurologic: Pain and light touch intact in extremities , Motor exam as listed above  Non-Invasive Vascular Imaging LLE ABI (Date: 02/17/2013)  L: 0.76 , DP: mono, PT: mono, TBI: 0.40  Medical Decision Making  Javier Quinn is a 53 y.o. male who presents with: severe L tibial artery disease, s/p L AKA, likely chronic venous insufficiency LLE, poorly controlled DM   This patient's prior angiogram demonstrates no  revascularization option in the left foot.  At this point, his left leg is asx.  I recommended considering a LLE OTC compression stocking to help with likely CVI, partially due to limited mobility.  I encouraged him to get adjustment of his stump socket in order to increase his mobility.  Based on the patient's vascular studies and examination, I have offered the patient: q6 month LLE ABI.  I discussed in depth with the patient the nature of atherosclerosis, and emphasized the importance of maximal medical management including strict control of blood pressure, blood glucose, and lipid levels, antiplatelet agents, obtaining regular exercise, and cessation of smoking.    The patient is aware that without maximal medical management the underlying atherosclerotic disease process will progress, limiting the benefit of any interventions. The patient is currently on a statin: Lipitor.   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  Thank you for allowing [place] to participate in this patient's care.  Adele Barthel, MD [facility] Office: [phone] Pager: [phone]  02/17/2013, 1:11 PM

## 2013-02-20 NOTE — Addendum Note (Signed)
Addended by: Dorthula Rue L on: 02/20/2013 04:33 PM   Modules accepted: Orders

## 2013-02-21 ENCOUNTER — Ambulatory Visit: Payer: PRIVATE HEALTH INSURANCE | Admitting: Cardiovascular Disease

## 2013-03-06 ENCOUNTER — Non-Acute Institutional Stay (SKILLED_NURSING_FACILITY): Payer: Medicaid Other | Admitting: Nurse Practitioner

## 2013-03-06 DIAGNOSIS — K5900 Constipation, unspecified: Secondary | ICD-10-CM

## 2013-03-06 DIAGNOSIS — S78111A Complete traumatic amputation at level between right hip and knee, initial encounter: Secondary | ICD-10-CM

## 2013-03-06 DIAGNOSIS — I739 Peripheral vascular disease, unspecified: Secondary | ICD-10-CM

## 2013-03-06 DIAGNOSIS — E1159 Type 2 diabetes mellitus with other circulatory complications: Secondary | ICD-10-CM

## 2013-03-06 DIAGNOSIS — E785 Hyperlipidemia, unspecified: Secondary | ICD-10-CM

## 2013-03-06 DIAGNOSIS — S78119A Complete traumatic amputation at level between unspecified hip and knee, initial encounter: Secondary | ICD-10-CM

## 2013-03-06 DIAGNOSIS — I70219 Atherosclerosis of native arteries of extremities with intermittent claudication, unspecified extremity: Secondary | ICD-10-CM

## 2013-03-06 DIAGNOSIS — I10 Essential (primary) hypertension: Secondary | ICD-10-CM

## 2013-03-06 DIAGNOSIS — G546 Phantom limb syndrome with pain: Secondary | ICD-10-CM

## 2013-03-06 DIAGNOSIS — H409 Unspecified glaucoma: Secondary | ICD-10-CM

## 2013-03-06 DIAGNOSIS — G547 Phantom limb syndrome without pain: Secondary | ICD-10-CM

## 2013-03-06 MED ORDER — INSULIN LISPRO 100 UNIT/ML ~~LOC~~ SOLN: SUBCUTANEOUS | Status: DC

## 2013-03-06 NOTE — Progress Notes (Signed)
Patient ID: Javier Quinn, male   DOB: 10-06-1960, 53 y.o.   MRN: [id number]   Nursing Home Location:  [facility] [place] of Service: SNF (31)  PCP: Angelica Chessman, MD  Allergies  Allergen Reactions  . Codeine Other (See Comments)    Causes constipation    Chief Complaint  Patient presents with  . Medical Managment of Chronic Issues    HPI:  53 year old seen today for routine visit and medical management of multiple and complex co-morbidities including PVD, AKA (right), HTN, Phantom limb pain, T2DM, and Dyslipidemia. He has been in his usual state of health since last examination and continues to work with PT on mobility adjustment with the use of prosthetic device for right AKA. Patient can safely mobilize in room independently. He complains of diarrhea and mild nausea for last day, without associated vomiting, fever, or rash. Fasting FSBS continue to be elevated despite recent increase in [place].   Review of Systems:  Review of Systems  Constitutional: Negative for fever, chills and weight loss.  HENT: Negative for congestion and sore throat.   Eyes: Negative for blurred vision, double vision, photophobia and discharge.  Respiratory: Negative for cough and sputum production.   Cardiovascular: Positive for claudication and leg swelling (left leg). Negative for chest pain and palpitations.  Gastrointestinal: Positive for nausea and diarrhea. Negative for heartburn, vomiting, abdominal pain, constipation, blood in stool and melena.  Genitourinary: Negative for dysuria, urgency and frequency.  Musculoskeletal: Negative for falls.  Skin: Negative for itching and rash.  Neurological: Positive for dizziness ("when I sit up and move around") and weakness. Negative for tremors, loss of consciousness and headaches.  Psychiatric/Behavioral: Negative for depression. The patient is not nervous/anxious and does not have insomnia.      Past Medical History  Diagnosis  Date  . Diabetes mellitus without complication   . Hyperlipidemia   . Tobacco dependence   . GERD (gastroesophageal reflux disease)   . H/O hiatal hernia   . Peripheral vascular disease   . Hypertension     See's Dr Johnsie Cancel States   . PVD (peripheral vascular disease) with claudication 05/18/2012  . Atherosclerotic peripheral vascular disease with intermittent claudication 05/18/2012  . Unspecified essential hypertension 05/18/2012  . Type II or unspecified type diabetes mellitus with peripheral circulatory disorders, uncontrolled(250.72) 10/11/2012  . Dyslipidemia 08/10/2012   Past Surgical History  Procedure Laterality Date  . Bypass graft femoral-peroneal Right 06/15/2012    Procedure: BYPASS GRAFT Campbell Stall;  Surgeon: Conrad [place], MD;  Location: [facility];  Service: Vascular;  Laterality: Right;  using right non-reversed saphenous vein  . Lower extremity angiogram Right 06/15/2012    Procedure: LOWER EXTREMITY ANGIOGRAM;  Surgeon: Conrad [place], MD;  Location: [facility];  Service: Vascular;  Laterality: Right;  . Lower extremity angiogram Right 09/09/2012    Procedure: LOWER EXTREMITY ANGIOGRAM With Agioplasty Bypass Graft;  Surgeon: Conrad [place], MD;  Location: [facility];  Service: Vascular;  Laterality: Right;  . Amputation Right 09/28/2012    Procedure: AMPUTATION ABOVE KNEE;  Surgeon: Conrad [place], MD;  Location: [facility];  Service: Vascular;  Laterality: Right;   Social History:   reports that he quit smoking about 10 months ago. His smoking use included Cigarettes. He has a 10 pack-year smoking history. He has quit using smokeless tobacco. He reports that he does not drink alcohol or use illicit drugs.  Family History  Problem Relation Age of Onset  .  Cancer Mother     pancreas  . Cancer Father     Medications: Patient's Medications  New Prescriptions   No medications on file  Previous Medications   ALBUTEROL (PROVENTIL HFA;VENTOLIN HFA) 108 (90 BASE) MCG/ACT INHALER    Inhale 2 puffs  into the lungs every 4 (four) hours as needed for wheezing.   ATORVASTATIN (LIPITOR) 10 MG TABLET    Take 40 mg by mouth daily.    CLOPIDOGREL (PLAVIX) 75 MG TABLET    Take 1 tablet (75 mg total) by mouth daily with breakfast.   DOCUSATE SODIUM (COLACE) 100 MG CAPSULE    Take 100 mg by mouth 2 (two) times daily.    GABAPENTIN (NEURONTIN) 300 MG CAPSULE    Take 1 capsule (300 mg total) by mouth 3 (three) times daily.   INSULIN DETEMIR (LEVEMIR) 100 UNIT/ML INJECTION    Inject 10 Units into the skin at bedtime.   INSULIN LISPRO (HUMALOG) 100 UNIT/ML INJECTION    Inject SSI into the skin 3 (three) times daily before meals.   LATANOPROST (XALATAN) 0.005 % OPHTHALMIC SOLUTION    Place 1 drop into both eyes at bedtime.   LISINOPRIL-HYDROCHLOROTHIAZIDE (PRINZIDE,ZESTORETIC) 20-12.5 MG PER TABLET    Take 1 tablet by mouth daily.   MELATONIN 3 MG TABS    Take 1 tablet by mouth at bedtime.   METFORMIN (GLUCOPHAGE-XR) 500 MG 24 HR TABLET    Take 1,000 mg by mouth 2 (two) times daily.    OXYCODONE (ROXICODONE) 5 MG IMMEDIATE RELEASE TABLET    Take 2 tablets (10 mg total) by mouth every 4 (four) hours as needed.      Modified Medications   No medications on file  Discontinued Medications   No medications on file     Physical Exam:  Filed Vitals:   03/06/13 1413  BP: 125/76  Pulse: 52  Temp: 96 F (35.6 C)  Resp: 18  Weight: 218 lb (98.884 kg)   Physical Exam  Constitutional: He is oriented to person, place, and time. No distress.  HENT:  Head: Normocephalic and atraumatic.  Eyes: Pupils are equal, round, and reactive to light. Right eye exhibits no discharge. Left eye exhibits no discharge.  Neck: Normal range of motion. Neck supple. No JVD present. No thyromegaly present.  Cardiovascular: Normal rate, regular rhythm, normal heart sounds and intact distal pulses.  Exam reveals no gallop and no friction rub.   No murmur heard. Pulmonary/Chest: Effort normal and breath sounds normal. No  stridor. No respiratory distress. He has no wheezes. He has no rales.  Abdominal: Soft. Bowel sounds are normal. He exhibits no distension and no mass. There is no tenderness. There is no rebound and no guarding.  Musculoskeletal: He exhibits no edema and no tenderness.  Right AKA  Neurological: He is alert and oriented to person, place, and time. No cranial nerve deficit. GCS score is 15.  Skin: Skin is warm and dry. No rash noted. He is not diaphoretic. No pallor.  Psychiatric: Mood, memory and judgment normal.  Flat affect.       Labs reviewed: Comprehensive Metabolic Panel    Result: 02/01/2013 10:00 AM   ( Status: F )     C Sodium 139     135-145 mEq/L SLN   Potassium 4.2     3.5-5.3 mEq/L SLN   Chloride 102     96-112 mEq/L SLN   CO2 25     19-32 mEq/L SLN   Glucose 201  H 70-99 mg/dL SLN   BUN 14     6-23 mg/dL SLN   Creatinine 0.88     0.50-1.35 mg/dL SLN   Bilirubin, Total 0.3     0.2-1.2 mg/dL SLN C Alkaline Phosphatase 105     39-117 U/L SLN   AST/SGOT 21     0-37 U/L SLN   ALT/SGPT 36     0-53 U/L SLN   Total Protein 6.8     6.0-8.3 g/dL SLN   Albumin 4.3     3.5-5.2 g/dL SLN   Calcium 9.9     8.4-10.5 mg/dL SLN   Lipid Profile    Result: 02/01/2013 10:00 AM   ( Status: F )       Cholesterol 133     0-200 mg/dL SLN C Triglyceride 212   H <150 mg/dL SLN   HDL Cholesterol 44     >39 mg/dL SLN   Total Chol/HDL Ratio 3.0      Ratio SLN   VLDL Cholesterol (Calc) 42   H 0-40 mg/dL SLN   LDL Cholesterol (Calc) 47     0-99 mg/dL SLN C  Assessment/Plan 1. Atherosclerotic peripheral vascular disease with intermittent claudication Left leg stable without acute changes or claudication. Will continue to monitor.  2. Unspecified essential hypertension BP controlled with current does of prinzide, zestoretic. Will continue doses.  3. PVD (peripheral vascular disease) Continue Plavix daily.  4. Type II or unspecified type diabetes mellitus with peripheral circulatory  disorders, uncontrolled(250.72) Fasting FSBS remain elevated in the high 200s-300s. Will increase Levamir dose to 30 units and follow up for response on next months visit or sooner if needed   5. Constipation Resolved. Use bowel regimen per facility protocol as needed.  6. Phantom limb pain Continue per Roxicodone and Neurontin for pain management needs.   7. Glaucoma Recent eye exam noted. Continue Xalantan gtts per recommendation.  8. Above knee amputation of right lower extremity Continues to work with therapy staff. Has prosthetic limb to optimize mobility.   9. Dyslipidemia Triglycerides continue elevated; unsure if lab is fasting. Hgb AIC also elevated, may be contributory. Will continue to monitor both for possible adjustments in medications. Will remain on current Lipitor dose for now.   10. Gastroenteritis -since yesterday, still able to eat and drink; encouraged fluids and to notify if last more than 48-72 hours or unable to tolerate POs   Hassell Done, NP

## 2013-03-09 ENCOUNTER — Encounter: Payer: Self-pay | Admitting: Cardiovascular Disease

## 2013-03-09 ENCOUNTER — Ambulatory Visit (HOSPITAL_COMMUNITY): Payer: Medicaid Other | Attending: Cardiovascular Disease | Admitting: Cardiology

## 2013-03-09 ENCOUNTER — Ambulatory Visit: Payer: Medicaid Other | Admitting: Cardiovascular Disease

## 2013-03-09 ENCOUNTER — Ambulatory Visit (INDEPENDENT_AMBULATORY_CARE_PROVIDER_SITE_OTHER): Payer: Medicaid Other | Admitting: Cardiovascular Disease

## 2013-03-09 ENCOUNTER — Ambulatory Visit: Payer: PRIVATE HEALTH INSURANCE | Admitting: Cardiovascular Disease

## 2013-03-09 VITALS — BP 128/82 | HR 85 | Wt 220.0 lb

## 2013-03-09 DIAGNOSIS — D219 Benign neoplasm of connective and other soft tissue, unspecified: Secondary | ICD-10-CM

## 2013-03-09 DIAGNOSIS — M25439 Effusion, unspecified wrist: Secondary | ICD-10-CM

## 2013-03-09 DIAGNOSIS — S4991XA Unspecified injury of right shoulder and upper arm, initial encounter: Secondary | ICD-10-CM

## 2013-03-09 DIAGNOSIS — M79609 Pain in unspecified limb: Secondary | ICD-10-CM

## 2013-03-09 DIAGNOSIS — I739 Peripheral vascular disease, unspecified: Secondary | ICD-10-CM

## 2013-03-09 DIAGNOSIS — D3610 Benign neoplasm of peripheral nerves and autonomic nervous system, unspecified: Secondary | ICD-10-CM | POA: Insufficient documentation

## 2013-03-09 DIAGNOSIS — M25539 Pain in unspecified wrist: Secondary | ICD-10-CM

## 2013-03-09 DIAGNOSIS — I70219 Atherosclerosis of native arteries of extremities with intermittent claudication, unspecified extremity: Secondary | ICD-10-CM | POA: Insufficient documentation

## 2013-03-09 DIAGNOSIS — I10 Essential (primary) hypertension: Secondary | ICD-10-CM

## 2013-03-09 DIAGNOSIS — E785 Hyperlipidemia, unspecified: Secondary | ICD-10-CM

## 2013-03-09 DIAGNOSIS — M25519 Pain in unspecified shoulder: Secondary | ICD-10-CM

## 2013-03-09 DIAGNOSIS — E1159 Type 2 diabetes mellitus with other circulatory complications: Secondary | ICD-10-CM

## 2013-03-09 NOTE — Patient Instructions (Addendum)
Your physician recommends that you continue on your current medications as directed. Please refer to the Current Medication list given to you today.  Your physician has requested that you have a lower or upper extremity arterial duplex. This test is an ultrasound of the arteries in the legs or arms. It looks at arterial blood flow in the legs and arms. Allow one hour for Lower and Upper Arterial scans. There are no restrictions or special instructions  You have been referred to Dr Dorna Leitz for your Shoulder Injury [address], [place], [place] [address] [phone] We will set up an appointment for you.  You have been referred to [facility] Surgery Dr Zella Richer  [address], [place], [place] [address] [phone] We will set up an appointment for you.

## 2013-03-09 NOTE — Assessment & Plan Note (Signed)
Concerned that mass could be pseudoaneurysm from arterial line sight from last surgery.  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  Refer to general surgery for possible excision

## 2013-03-09 NOTE — Assessment & Plan Note (Signed)
Cholesterol is at goal.  Continue current dose of statin and diet Rx.  No myalgias or side effects.  F/U  LFT's in 6 months. Lab Results  Component Value Date   LDLCALC 69 06/23/2012             

## 2013-03-09 NOTE — Assessment & Plan Note (Signed)
Well controlled.  Continue current medications and low sodium Dash type diet.    

## 2013-03-09 NOTE — Assessment & Plan Note (Signed)
F/U Dr Bridgett Larsson Still needs PT and ? Prosthetic leg evaluation

## 2013-03-09 NOTE — Assessment & Plan Note (Signed)
Given recent right AKA should be looked into as ambulation will depend on UE strength and possible need for crutches.  Refer to ortho and possible MRI

## 2013-03-09 NOTE — Progress Notes (Signed)
Upper Arterial Duplex Limited Complete

## 2013-03-09 NOTE — Progress Notes (Signed)
Patient ID: Javier Quinn, male   DOB: Dec 22, 1960, 53 y.o.   MRN: [id number] 53 yo smoker who quit a month ago with PVD. Needs RLE surgery with Dr Bridgett Larsson. Has 1 block claudication right worse than left with resting ABI:s in the .5 to .6 range. No nonhealing ulcers. Lots of pain and swelling in feet. No history of CAD. Still works detailing cars. CRF;s poorly controlled DM and HTN quit smoking a month ago. No chest pain but limited activity. Denies dyspnea palpitations or syncope  05/27/12 myovue normal EF 50%  Surgery done 5/00 with no complications  1. Right superficial femoral artery to peroneal artery bypass with ipsilateral non-reverse greater saphenous vein  2. Open superficial femoral artery cannulation  3. Right leg runoff  Has not had good result Still with lots of pain and LE swelling ABI .79 on right and .56 on left. Venous duplex 08/04/12 no DVT   Subsequently had to have right AKA in September.    [place] recently and has pain in right shoulder with limited ROM Has painful mass over right wrist since second surgery   ROS: Denies fever, malais, weight loss, blurry vision, decreased visual acuity, cough, sputum, SOB, hemoptysis, pleuritic pain, palpitaitons, heartburn, abdominal pain, melena, lower extremity edema, claudication, or rash.  All other systems reviewed and negative  General: Affect appropriate Chronically ill black male in wheel chair HEENT: normal Neck supple with no adenopathy JVP normal no bruits no thyromegaly Lungs clear with no wheezing and good diaphragmatic motion Heart:  S1/S2 no murmur, no rub, gallop or click PMI normal Abdomen: benighn, BS positve, no tenderness, no AAA no bruit.  No HSM or HJR Right AKA  Plus one  edema  LLE  Neuro non-focal Skin warm and dry ? 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 albuterol (PROVENTIL HFA;VENTOLIN HFA) 108 (90 BASE) MCG/ACT inhaler  Inhale 2 puffs into the lungs every 4 (four) hours as needed for wheezing.  1 each  0  . atorvastatin (LIPITOR) 10 MG tablet Take 40 mg by mouth daily.       . clopidogrel (PLAVIX) 75 MG tablet Take 1 tablet (75 mg total) by mouth daily with breakfast.  30 tablet  6  . docusate sodium (COLACE) 100 MG capsule Take 100 mg by mouth 2 (two) times daily.       [name] gabapentin (NEURONTIN) 300 MG capsule Take 1 capsule (300 mg total) by mouth 3 (three) times daily.  90 capsule  11  . insulin detemir (LEVEMIR) 100 UNIT/ML injection Inject 20 Units into the skin at bedtime.       . insulin lispro (HUMALOG) 100 UNIT/ML injection SSI TID with meals  10 mL    . latanoprost (XALATAN) 0.005 % ophthalmic solution Place 1 drop into both eyes at bedtime.      [name] lisinopril-hydrochlorothiazide (PRINZIDE,ZESTORETIC) 20-12.5 MG per tablet Take 1 tablet by mouth daily.  30 tablet  4  . Melatonin 3 MG TABS Take 1 tablet by mouth at bedtime.      . metFORMIN (GLUCOPHAGE-XR) 500 MG 24 hr tablet Take 1,000 mg by mouth 2 (two) times daily.       [name] oxyCODONE (ROXICODONE) 5 MG immediate release tablet Take 2 tablets (10 mg total) by mouth every 4 (four) hours as needed.  30 tablet  0   No current facility-administered medications for this visit.    Allergies  Codeine  Electrocardiogram:  9/5  SR rate 46 T wave inversion I,AVL  ? Old IMI   Assessment and Plan

## 2013-03-09 NOTE — Assessment & Plan Note (Signed)
Discussed low carb diet.  Target hemoglobin A1c is 6.5 or less.  Continue current medications.  

## 2013-03-10 ENCOUNTER — Encounter: Payer: Self-pay | Admitting: Gastroenterology

## 2013-03-21 ENCOUNTER — Non-Acute Institutional Stay (SKILLED_NURSING_FACILITY): Payer: Medicaid Other | Admitting: Internal Medicine

## 2013-03-21 DIAGNOSIS — M67919 Unspecified disorder of synovium and tendon, unspecified shoulder: Secondary | ICD-10-CM

## 2013-03-21 DIAGNOSIS — M7551 Bursitis of right shoulder: Secondary | ICD-10-CM

## 2013-03-21 DIAGNOSIS — M719 Bursopathy, unspecified: Secondary | ICD-10-CM

## 2013-03-27 NOTE — Progress Notes (Signed)
Patient ID: Javier Quinn, male   DOB: 07-28-1960, 53 y.o.   MRN: [id number]                  PROGRESS NOTE  DATE:  03/21/2013    FACILITY: [facility]    LEVEL OF CARE:   SNF   Acute Visit   CHIEF COMPLAINT:  Right shoulder pain.    HISTORY OF PRESENT ILLNESS:  Javier Quinn tells me that he has been having increasing pain in his right shoulder dating back to a fall he had shortly around [place].  In spite of the fact this has been of some duration, I do not see that this has been seen previously.  Nursing reports that he only started complaining about this within the last few days.  It does not appear that he had an x-ray that I can see.  The patient tells me that the pain is mostly worse at night and it keeps him from sleeping.    PHYSICAL EXAMINATION:   MUSCULOSKELETAL:   EXTREMITIES:   RIGHT UPPER EXTREMITY:  Right shoulder:  There may be some effusion here.  Most of the tenderness is anteriorly with his arm in extension.  This would suggest that this is subacromial bursitis, although I cannot be reasonably sure about this.  He does have reasonably full range, although continuing abduction past 90 degrees is uncomfortable.    I also noted incidentally that he has a firm swelling on the lateral aspect of his right wrist.  This has the appearance of a neuroma.  This is probably going to need to be excised, either Orthopaedics or General Surgery.    ASSESSMENT/PLAN:  ?Subacromial bursitis.  I will get an x-ray of the right shoulder.  He had a normal BUN and creatinine.  A short course of nonsteroidals might also help him.    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  This will need to be seen by Orthopaedics.     CPT CODE: 50932

## 2013-04-04 ENCOUNTER — Ambulatory Visit: Payer: PRIVATE HEALTH INSURANCE | Admitting: Cardiovascular Disease

## 2013-04-12 DIAGNOSIS — E1059 Type 1 diabetes mellitus with other circulatory complications: Secondary | ICD-10-CM

## 2013-04-12 DIAGNOSIS — B351 Tinea unguium: Secondary | ICD-10-CM

## 2013-04-14 ENCOUNTER — Ambulatory Visit (INDEPENDENT_AMBULATORY_CARE_PROVIDER_SITE_OTHER): Payer: Medicaid Other | Admitting: Cardiovascular Disease

## 2013-04-14 ENCOUNTER — Encounter: Payer: Self-pay | Admitting: Cardiovascular Disease

## 2013-04-14 VITALS — BP 126/77 | HR 77 | Ht 65.0 in | Wt 195.0 lb

## 2013-04-14 DIAGNOSIS — S78119A Complete traumatic amputation at level between unspecified hip and knee, initial encounter: Secondary | ICD-10-CM

## 2013-04-14 DIAGNOSIS — S78111A Complete traumatic amputation at level between right hip and knee, initial encounter: Secondary | ICD-10-CM

## 2013-04-14 DIAGNOSIS — E785 Hyperlipidemia, unspecified: Secondary | ICD-10-CM

## 2013-04-14 DIAGNOSIS — D219 Benign neoplasm of connective and other soft tissue, unspecified: Secondary | ICD-10-CM

## 2013-04-14 DIAGNOSIS — I739 Peripheral vascular disease, unspecified: Secondary | ICD-10-CM

## 2013-04-14 DIAGNOSIS — M25519 Pain in unspecified shoulder: Secondary | ICD-10-CM

## 2013-04-14 DIAGNOSIS — E1159 Type 2 diabetes mellitus with other circulatory complications: Secondary | ICD-10-CM

## 2013-04-14 DIAGNOSIS — D3610 Benign neoplasm of peripheral nerves and autonomic nervous system, unspecified: Secondary | ICD-10-CM

## 2013-04-14 NOTE — Progress Notes (Signed)
Patient ID: Javier Quinn, male   DOB: [birth year], 53 y.o.   MRN: [id number] 53 yo smoker who quit a month ago with PVD. Needs RLE surgery with Dr Bridgett Larsson. Has 1 block claudication right worse than left with resting ABI:s in the .5 to .6 range. No nonhealing ulcers. Lots of pain and swelling in feet. No history of CAD. Still works detailing cars. CRF;s poorly controlled DM and HTN quit smoking a month ago. No chest pain but limited activity. Denies dyspnea palpitations or syncope  05/27/12 myovue normal EF 50%  Surgery done 7/34 with no complications  1. Right superficial femoral artery to peroneal artery bypass with ipsilateral non-reverse greater saphenous vein  2. Open superficial femoral artery cannulation  3. Right leg runoff  Has not had good result Still with lots of pain and LE swelling ABI .79 on right and .56 on left. Venous duplex 08/04/12 no DVT  Subsequently had to have right AKA in September.  [place] recently and has pain in right shoulder with limited ROM  Has painful mass over right wrist since second surgery  Nothing has really been done since last visit.  He has no active cardiac issues.  Needs the following  1) Referral to ortho for right shoulder pain ? 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  ? By general surgeon or hand surgeon  3) Needs f/u with Dr Bridgett Larsson for LLE persistent vascular disease and dependant edema      ROS: Denies fever, malais, weight loss, blurry vision, decreased visual acuity, cough, sputum, SOB, hemoptysis, pleuritic pain, palpitaitons, heartburn, abdominal pain, melena, lower extremity edema, claudication, or rash.  All other systems reviewed and negative  General: Affect appropriate Chronically ill black male  HEENT: normal Neck supple with no adenopathy JVP normal no bruits no  thyromegaly Lungs clear with no wheezing and good diaphragmatic motion Heart:  S1/S2 no murmur, no rub, gallop or click PMI normal Abdomen: benighn, BS positve, no tenderness, no AAA no bruit.  No HSM or HJR Right AKA with poor distal pulses in LLE with plus 2 edema not helped by compression stockings  No edema Neuro non-focal Skin warm and dry No muscular weakness   Current Outpatient Prescriptions  Medication Sig Dispense Refill  . albuterol (PROVENTIL HFA;VENTOLIN HFA) 108 (90 BASE) MCG/ACT inhaler Inhale 2 puffs into the lungs every 4 (four) hours as needed for wheezing.  1 each  0  . atorvastatin (LIPITOR) 10 MG tablet Take 40 mg by mouth daily.       . clopidogrel (PLAVIX) 75 MG tablet Take 1 tablet (75 mg total) by mouth daily with breakfast.  30 tablet  6  . docusate sodium (COLACE) 100 MG capsule Take 100 mg by mouth 2 (two) times daily.       [name] gabapentin (NEURONTIN) 300 MG capsule Take 1 capsule (300 mg total) by mouth 3 (three) times daily.  90 capsule  11  . insulin detemir (LEVEMIR) 100 UNIT/ML injection Inject 10 Units into the skin at bedtime.       . insulin lispro (HUMALOG) 100 UNIT/ML injection SSI TID with meals  10 mL    . latanoprost (XALATAN) 0.005 % ophthalmic solution Place 1 drop into both eyes at bedtime.      [name] lisinopril-hydrochlorothiazide (PRINZIDE,ZESTORETIC) 20-12.5 MG per tablet Take  1 tablet by mouth daily.  30 tablet  4  . Melatonin 3 MG TABS Take 1 tablet by mouth at bedtime.      . metFORMIN (GLUCOPHAGE-XR) 500 MG 24 hr tablet Take 1,000 mg by mouth 2 (two) times daily.       [name] oxyCODONE (ROXICODONE) 5 MG immediate release tablet Take 2 tablets (10 mg total) by mouth every 4 (four) hours as needed.  30 tablet  0   No current facility-administered medications for this visit.    Allergies  Codeine  Electrocardiogram:  SR rate 85 poor R wave progression Q wave in 3,F   Assessment and Plan

## 2013-04-14 NOTE — Assessment & Plan Note (Signed)
F/U with Dr Bridgett Larsson ABI on left only .5 and has edema  Add Lasix 20 mg to HCTZ  Continue support hose BMET in 4 weeks

## 2013-04-14 NOTE — Assessment & Plan Note (Signed)
Interfering with rehab no help with cortisone  Will refer to ortho shoulder specialist Tamera Punt or Mardelle Matte

## 2013-04-14 NOTE — Assessment & Plan Note (Signed)
Cholesterol is at goal.  Continue current dose of statin and diet Rx.  No myalgias or side effects.  F/U  LFT's in 6 months. Lab Results  Component Value Date   LDLCALC 69 06/23/2012             

## 2013-04-14 NOTE — Assessment & Plan Note (Signed)
Needs continued PT/OT  Again this is hampered by shoulder pain since he has to use crutches.

## 2013-04-14 NOTE — Assessment & Plan Note (Signed)
Well controlled.  Continue current medications and low sodium Dash type diet.    

## 2013-04-14 NOTE — Assessment & Plan Note (Signed)
Discussed low carb diet.  Target hemoglobin A1c is 6.5 or less.  Continue current medications.  

## 2013-04-14 NOTE — Assessment & Plan Note (Signed)
Refer to hand surgeon ? Dr Amedeo Plenty who would likely be best person to resect neuroma  MRI scheduled by rehab doctor not sure it is necessary

## 2013-04-14 NOTE — Patient Instructions (Addendum)
Your physician recommends that you schedule a follow-up appointment in:  [place] physician recommends that you return for lab work in:  [place]  DX  V58.69 You have been referred to  [facility] [place] Your physician has recommended you make the following change in your medication:  ADD  FUROSEMIDE   20 MG  EVERY DAY

## 2013-04-17 ENCOUNTER — Non-Acute Institutional Stay (SKILLED_NURSING_FACILITY): Payer: Medicaid Other | Admitting: Nurse Practitioner

## 2013-04-17 DIAGNOSIS — D3610 Benign neoplasm of peripheral nerves and autonomic nervous system, unspecified: Secondary | ICD-10-CM

## 2013-04-17 DIAGNOSIS — K5900 Constipation, unspecified: Secondary | ICD-10-CM

## 2013-04-17 DIAGNOSIS — I10 Essential (primary) hypertension: Secondary | ICD-10-CM

## 2013-04-17 DIAGNOSIS — M25519 Pain in unspecified shoulder: Secondary | ICD-10-CM

## 2013-04-17 DIAGNOSIS — I70219 Atherosclerosis of native arteries of extremities with intermittent claudication, unspecified extremity: Secondary | ICD-10-CM

## 2013-04-17 DIAGNOSIS — R609 Edema, unspecified: Secondary | ICD-10-CM

## 2013-04-17 DIAGNOSIS — D219 Benign neoplasm of connective and other soft tissue, unspecified: Secondary | ICD-10-CM

## 2013-04-17 DIAGNOSIS — E1159 Type 2 diabetes mellitus with other circulatory complications: Secondary | ICD-10-CM

## 2013-04-17 NOTE — Progress Notes (Signed)
Patient ID: Javier Quinn, male   DOB: 03/05/1960, 53 y.o.   MRN: [id number]   Nursing Home Location:  [place] of Service:    PCP: Angelica Chessman, MD  Allergies  Allergen Reactions  . Codeine Other (See Comments)    Causes constipation    Chief Complaint  Patient presents with  . Medical Managment of Chronic Issues    HPI:  Javier Quinn is seen today for medical management and routine follow-up of multiple medical problems, including PVD, T2DM, Htn, ASCVD, and Right AKA. He has been having right shoulder pain that he relates to self-propeling and rehab (crutch walking). He avoids prn pain Roxicodone due to issues with constipation. Reports his right shoulder pain hasn't improved with steroid injection. 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 He denies numbness, tingling, or pain to right hand. His T2DM remains difficult to control with fasting blood sugars ranging 160-200. He reports his LE swelling is improved. His po intake is good. He has c/o oral pain to staff, but again refuses prn pain medication. He has been having scheduled dental extractions and is in the process of obtaining dentures.   Review of Systems:  Review of Systems  Constitutional: Negative for fever, weight loss, malaise/fatigue and diaphoresis.       Up OOB ad lib, self-propelling throughout facility.  HENT: Negative for congestion.   Eyes: Negative for blurred vision.  Respiratory: Negative for cough and shortness of breath.   Cardiovascular: Negative for chest pain, palpitations, claudication and leg swelling.  Gastrointestinal: Negative for nausea, vomiting, abdominal pain and constipation.  Genitourinary: Negative for dysuria.  Musculoskeletal: Positive for joint pain and myalgias. Negative for falls.  Neurological: Negative for dizziness, tingling, sensory change, weakness and headaches.  Endo/Heme/Allergies: Negative for polydipsia.    Psychiatric/Behavioral: Negative for depression. The patient is not nervous/anxious. Insomnia: not sleeping well due to broken bed, maintenance staff aware.      Past Medical History  Diagnosis Date  . Diabetes mellitus without complication   . Hyperlipidemia   . Tobacco dependence   . GERD (gastroesophageal reflux disease)   . H/O hiatal hernia   . Peripheral vascular disease   . Hypertension     See's Dr Johnsie Cancel States   . PVD (peripheral vascular disease) with claudication 05/18/2012  . Atherosclerotic peripheral vascular disease with intermittent claudication 05/18/2012  . Unspecified essential hypertension 05/18/2012  . Type II or unspecified type diabetes mellitus with peripheral circulatory disorders, uncontrolled(250.72) 10/11/2012  . Dyslipidemia 08/10/2012   Past Surgical History  Procedure Laterality Date  . Bypass graft femoral-peroneal Right 06/15/2012    Procedure: BYPASS GRAFT Campbell Stall;  Surgeon: Conrad Acton, MD;  Location: [facility];  Service: Vascular;  Laterality: Right;  using right non-reversed saphenous vein  . Lower extremity angiogram Right 06/15/2012    Procedure: LOWER EXTREMITY ANGIOGRAM;  Surgeon: Conrad [place], MD;  Location: [facility];  Service: Vascular;  Laterality: Right;  . Lower extremity angiogram Right 09/09/2012    Procedure: LOWER EXTREMITY ANGIOGRAM With Agioplasty Bypass Graft;  Surgeon: Conrad [place], MD;  Location: [facility];  Service: Vascular;  Laterality: Right;  . Amputation Right 09/28/2012    Procedure: AMPUTATION ABOVE KNEE;  Surgeon: Conrad , MD;  Location: [facility];  Service: Vascular;  Laterality: Right;   Social History:   reports that he quit smoking about a year ago. His smoking use included Cigarettes.  He has a 10 pack-year smoking history. He has quit using smokeless tobacco. He reports that he does not drink alcohol or use illicit drugs.  Family History  Problem Relation Age of Onset  . Cancer Mother     pancreas  . Cancer Father      Medications: Patient's Medications  New Prescriptions   No medications on file  Previous Medications   ALBUTEROL (PROVENTIL HFA;VENTOLIN HFA) 108 (90 BASE) MCG/ACT INHALER    Inhale 2 puffs into the lungs every 4 (four) hours as needed for wheezing.   ATORVASTATIN (LIPITOR) 10 MG TABLET    Take 40 mg by mouth daily.    CLOPIDOGREL (PLAVIX) 75 MG TABLET    Take 1 tablet (75 mg total) by mouth daily with breakfast.   DOCUSATE SODIUM (COLACE) 100 MG CAPSULE    Take 100 mg by mouth 2 (two) times daily.    GABAPENTIN (NEURONTIN) 300 MG CAPSULE    Take 1 capsule (300 mg total) by mouth 3 (three) times daily.   INSULIN DETEMIR (LEVEMIR) 100 UNIT/ML INJECTION    Inject 10 Units into the skin at bedtime.    INSULIN LISPRO (HUMALOG) 100 UNIT/ML INJECTION    SSI TID with meals   LATANOPROST (XALATAN) 0.005 % OPHTHALMIC SOLUTION    Place 1 drop into both eyes at bedtime.   LISINOPRIL-HYDROCHLOROTHIAZIDE (PRINZIDE,ZESTORETIC) 20-12.5 MG PER TABLET    Take 1 tablet by mouth daily.   MELATONIN 3 MG TABS    Take 1 tablet by mouth at bedtime.   METFORMIN (GLUCOPHAGE-XR) 500 MG 24 HR TABLET    Take 1,000 mg by mouth 2 (two) times daily.    OXYCODONE (ROXICODONE) 5 MG IMMEDIATE RELEASE TABLET    Take 2 tablets (10 mg total) by mouth every 4 (four) hours as needed.  Modified Medications   No medications on file  Discontinued Medications   No medications on file     Physical Exam: Physical Exam  Nursing note and vitals reviewed. Constitutional: He is well-developed, well-nourished, and in no distress.  HENT:  Head: Normocephalic and atraumatic.  Mouth/Throat: Mucous membranes are not pale. Normal dentition. No dental abscesses or dental caries. No oropharyngeal exudate.  Eyes: Pupils are equal, round, and reactive to light.  Neck: Normal range of motion. Neck supple.  Cardiovascular: Normal rate, regular rhythm, normal heart sounds and intact distal pulses.  Exam reveals no gallop and no friction  rub.   No murmur heard. Pulmonary/Chest: Effort normal and breath sounds normal. No respiratory distress.  Abdominal: Soft. Bowel sounds are normal.  Musculoskeletal: Normal range of motion. He exhibits no edema.  Right AKA  Neurological: He is alert. Coordination normal.  Skin: Skin is warm and dry.  Psychiatric: Mood, memory, affect and judgment normal.         Filed Vitals:   04/17/13 1048  BP: 150/72  Pulse: 80  Temp: 98.1 F (36.7 C)  Resp: 20  Weight: 218 lb (98.884 kg)   Labs reviewed:  Basic Metabolic Panel:  Recent Labs  09/27/12 1310 09/28/12 1548 09/29/12 0600 09/30/12 0613  NA 136  --  133* 135  K 4.3  --  3.8 3.5  CL 98  --  96 97  CO2 24  --  26 27  GLUCOSE 131*  --  171* 146*  BUN 10  --  8 7  CREATININE 0.83 0.81 0.79 0.84  CALCIUM 10.0  --  8.9 8.9   Liver Function Tests:  Recent Labs  08/04/12 1030 09/09/12 1137 09/27/12 1310  AST 26 34 32  ALT 33 40 30  ALKPHOS 103 91 101  BILITOT 0.4 0.2* 0.2*  PROT 7.5 7.5 7.4  ALBUMIN 4.1 4.4 4.2    Recent Labs  08/04/12 1030  LIPASE 38   No results found for this basename: AMMONIA,  in the last 8760 hours CBC:  Recent Labs  08/04/12 1030  09/20/12 0944  09/28/12 1548 09/29/12 0600 09/30/12 0613  WBC 7.1  < > 7.3  < > 11.8* 9.4 10.0  NEUTROABS 3.5  --  3.6  --   --   --   --   HGB 13.7  < > 13.3  < > 13.2 12.7* 12.4*  HCT 38.6*  < > 38.3*  < > 38.5* 37.6* 36.6*  MCV 84.1  < > 84.7  < > 84.4 85.1 85.7  PLT 282  < > 256  < > 241 237 217  < > = values in this interval not displayed. Cardiac Enzymes:  Recent Labs  09/09/12 2108 09/10/12 0630 09/10/12 1203  CKTOTAL 113 74 93  CKMB 2.5 2.3 2.2  TROPONINI <0.30 <0.30 <0.30   BNP: No components found with this basename: POCBNP,  CBG:  Recent Labs  10/03/12 2106 10/04/12 0703 10/04/12 1138  GLUCAP 182* 168* 133*   TSH:  Recent Labs  04/29/12 1157  TSH 0.856   A1C: Lab Results  Component Value Date   HGBA1C  7.7* 09/09/2012   Lipid Panel:  Recent Labs  04/29/12 1157 06/23/12 0600  CHOL 400* 143  HDL 55 38*  LDLCALC UNABLE TO CALCULATE IF TRIGLYCERIDE OVER 400 mg/dL 69  TRIG 608* 181*  CHOLHDL 7.3 3.8   Comprehensive Metabolic Panel  Result: 1/44/3154 10:00 AM ( Status: F ) C  Sodium 139 135-145 mEq/L SLN  Potassium 4.2 3.5-5.3 mEq/L SLN  Chloride 102 96-112 mEq/L SLN  CO2 25 19-32 mEq/L SLN  Glucose 201 H 70-99 mg/dL SLN  BUN 14 6-23 mg/dL SLN  Creatinine 0.88 0.50-1.35 mg/dL SLN  Bilirubin, Total 0.3 0.2-1.2 mg/dL SLN C  Alkaline Phosphatase 105 39-117 U/L SLN  AST/SGOT 21 0-37 U/L SLN  ALT/SGPT 36 0-53 U/L SLN  Total Protein 6.8 6.0-8.3 g/dL SLN  Albumin 4.3 3.5-5.2 g/dL SLN  Calcium 9.9 8.4-10.5 mg/dL SLN  Lipid Profile  Result: 02/01/2013 10:00 AM ( Status: F )  Cholesterol 133 0-200 mg/dL SLN C  Triglyceride 212 H <150 mg/dL SLN  HDL Cholesterol 44 >39 mg/dL SLN  Total Chol/HDL Ratio 3.0 Ratio SLN  VLDL Cholesterol (Calc) 42 H 0-40 mg/dL SLN  LDL Cholesterol (Calc) 47 0-99 mg/dL SLN C   Assessment/Plan  1. Unspecified essential hypertension BP stable. Will continue on current regimen and treat as needed. Tolerating addition of scheduled Lasix (due to LE edema) without BP issue. Will check potassium.   2. Type II or unspecified type diabetes mellitus with peripheral circulatory disorders, uncontrolled(250.72) Continues hyperglycemic with FSBS- around 170s-200s. Levamir recently increased however due to high blood sugars  will additional increase- .   3. Right Shoulder pain Pain persists after steroid joint injection and is affecting rehab regimen and mobility. Declines narcotic prns due to constipation concerns. Will add tramadol 50 mg 1-2 tablets q 6 hours PRN  4. Neuroma Following with Ortho. No distal paresthesia or pain to distal extremity.   5. Edema -improved on lasix, will cont to monitor    Hassell Done, NP

## 2013-04-18 ENCOUNTER — Other Ambulatory Visit: Payer: Self-pay | Admitting: *Deleted

## 2013-04-18 MED ORDER — TRAMADOL HCL 50 MG PO TABS: ORAL_TABLET | ORAL | Status: DC

## 2013-04-18 NOTE — Telephone Encounter (Signed)
[facility] 

## 2013-05-12 ENCOUNTER — Ambulatory Visit (INDEPENDENT_AMBULATORY_CARE_PROVIDER_SITE_OTHER): Payer: Medicaid Other | Admitting: *Deleted

## 2013-05-12 DIAGNOSIS — I10 Essential (primary) hypertension: Secondary | ICD-10-CM

## 2013-05-12 LAB — BASIC METABOLIC PANEL
BUN: 13 mg/dL (ref 6–23)
CHLORIDE: 103 meq/L (ref 96–112)
CO2: 26 mEq/L (ref 19–32)
Calcium: 9.6 mg/dL (ref 8.4–10.5)
Creatinine, Ser: 1 mg/dL (ref 0.4–1.5)
GFR: 104.61 mL/min (ref >60.00)
GLUCOSE: 238 mg/dL — AB (ref 70–99)
POTASSIUM: 4.2 meq/L (ref 3.5–5.1)
SODIUM: 138 meq/L (ref 135–145)

## 2013-05-22 ENCOUNTER — Non-Acute Institutional Stay (SKILLED_NURSING_FACILITY): Payer: Medicaid Other | Admitting: Nurse Practitioner

## 2013-05-22 DIAGNOSIS — I10 Essential (primary) hypertension: Secondary | ICD-10-CM

## 2013-05-22 DIAGNOSIS — I739 Peripheral vascular disease, unspecified: Secondary | ICD-10-CM

## 2013-05-22 DIAGNOSIS — D219 Benign neoplasm of connective and other soft tissue, unspecified: Secondary | ICD-10-CM

## 2013-05-22 DIAGNOSIS — E1159 Type 2 diabetes mellitus with other circulatory complications: Secondary | ICD-10-CM

## 2013-05-22 DIAGNOSIS — D3610 Benign neoplasm of peripheral nerves and autonomic nervous system, unspecified: Secondary | ICD-10-CM

## 2013-05-22 NOTE — Progress Notes (Signed)
Patient ID: Javier Quinn, male   DOB: Mar 05, 1960, 53 y.o.   MRN: [id number]    Nursing Home Location:  [place] of Service: SNF (31)  PCP: Angelica Chessman, MD  Allergies  Allergen Reactions  . Codeine Other (See Comments)    Causes constipation    Chief Complaint  Patient presents with  . Medical Management of Chronic Issues    HPI:  Javier Quinn is a 53 y.o. male with a pmh of PVD, T2DM, Htn, ASCVD, and Right AKAs seen today for medical management and routine follow-up of multiple medical problems . He conts to have right shoulder pain that he relates to self-propeling and rehab (crutch walking) that is managed with currently medication and followed by ortho. 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 His T2DM remains difficult to control with fasting blood sugars ranging 160-200. Reports complaints with diet and medications. his LE swelling is improved.   Review of Systems:  Review of Systems  Constitutional: Negative for fever, weight loss, malaise/fatigue and diaphoresis.  HENT: Negative for congestion.   Eyes: Negative for blurred vision.  Respiratory: Negative for cough and shortness of breath.   Cardiovascular: Negative for chest pain, palpitations, claudication and leg swelling.  Gastrointestinal: Negative for nausea, vomiting, abdominal pain and constipation.  Genitourinary: Negative for dysuria.  Musculoskeletal: Positive for joint pain and myalgias. Negative for falls.  Neurological: Negative for dizziness, tingling, sensory change, weakness and headaches.  Endo/Heme/Allergies: Negative for polydipsia.  Psychiatric/Behavioral: Negative for depression. The patient is not nervous/anxious.      Past Medical History  Diagnosis Date  . Diabetes mellitus without complication   . Hyperlipidemia   . Tobacco dependence   . GERD (gastroesophageal reflux disease)   . H/O hiatal hernia   .  Peripheral vascular disease   . Hypertension     See's Dr Johnsie Cancel States   . PVD (peripheral vascular disease) with claudication 05/18/2012  . Atherosclerotic peripheral vascular disease with intermittent claudication 05/18/2012  . Unspecified essential hypertension 05/18/2012  . Type II or unspecified type diabetes mellitus with peripheral circulatory disorders, uncontrolled(250.72) 10/11/2012  . Dyslipidemia 08/10/2012   Past Surgical History  Procedure Laterality Date  . Bypass graft femoral-peroneal Right 06/15/2012    Procedure: BYPASS GRAFT Campbell Stall;  Surgeon: Conrad Pearsonville, MD;  Location: [facility];  Service: Vascular;  Laterality: Right;  using right non-reversed saphenous vein  . Lower extremity angiogram Right 06/15/2012    Procedure: LOWER EXTREMITY ANGIOGRAM;  Surgeon: Conrad Wasco, MD;  Location: [facility];  Service: Vascular;  Laterality: Right;  . Lower extremity angiogram Right 09/09/2012    Procedure: LOWER EXTREMITY ANGIOGRAM With Agioplasty Bypass Graft;  Surgeon: Conrad Cheyenne Wells, MD;  Location: [facility];  Service: Vascular;  Laterality: Right;  . Amputation Right 09/28/2012    Procedure: AMPUTATION ABOVE KNEE;  Surgeon: Conrad Garrett, MD;  Location: [facility];  Service: Vascular;  Laterality: Right;   Social History:   reports that he quit smoking about 13 months ago. His smoking use included Cigarettes. He has a 10 pack-year smoking history. He has quit using smokeless tobacco. He reports that he does not drink alcohol or use illicit drugs.  Family History  Problem Relation Age of Onset  . Cancer Mother     pancreas  . Cancer Father     Medications: Patient's Medications  New Prescriptions   No medications  on file  Previous Medications   ALBUTEROL (PROVENTIL HFA;VENTOLIN HFA) 108 (90 BASE) MCG/ACT INHALER    Inhale 2 puffs into the lungs every 4 (four) hours as needed for wheezing.   ATORVASTATIN (LIPITOR) 10 MG TABLET    Take 40 mg by mouth daily.    CLOPIDOGREL (PLAVIX) 75 MG  TABLET    Take 1 tablet (75 mg total) by mouth daily with breakfast.   DOCUSATE SODIUM (COLACE) 100 MG CAPSULE    Take 100 mg by mouth 2 (two) times daily.    GABAPENTIN (NEURONTIN) 300 MG CAPSULE    Take 1 capsule (300 mg total) by mouth 3 (three) times daily.   INSULIN DETEMIR (LEVEMIR) 100 UNIT/ML INJECTION    Inject 30 Units into the skin at bedtime.    INSULIN LISPRO (HUMALOG) 100 UNIT/ML INJECTION    SSI TID with meals   LATANOPROST (XALATAN) 0.005 % OPHTHALMIC SOLUTION    Place 1 drop into both eyes at bedtime.   LISINOPRIL-HYDROCHLOROTHIAZIDE (PRINZIDE,ZESTORETIC) 20-12.5 MG PER TABLET    Take 1 tablet by mouth daily.   MELATONIN 3 MG TABS    Take 1 tablet by mouth at bedtime.   METFORMIN (GLUCOPHAGE-XR) 500 MG 24 HR TABLET    Take 1,000 mg by mouth 2 (two) times daily.    OXYCODONE (ROXICODONE) 5 MG IMMEDIATE RELEASE TABLET    Take 2 tablets (10 mg total) by mouth every 4 (four) hours as needed.   TRAMADOL (ULTRAM) 50 MG TABLET    Take one to two tablets by mouth every 6 hours as needed for pain  Modified Medications   No medications on file  Discontinued Medications   No medications on file     Physical Exam:  Filed Vitals:   05/22/13 1650  BP: 131/70  Pulse: 70  Temp: 97 F (36.1 C)  Resp: 20   Physical Exam  Nursing note and vitals reviewed. Constitutional: He is well-developed, well-nourished, and in no distress.  HENT:  Head: Normocephalic and atraumatic.  Mouth/Throat: Mucous membranes are not pale. Normal dentition. No dental abscesses or dental caries. No oropharyngeal exudate.  Eyes: Pupils are equal, round, and reactive to light.  Neck: Normal range of motion. Neck supple.  Cardiovascular: Normal rate, regular rhythm, normal heart sounds and intact distal pulses.  Exam reveals no gallop and no friction rub.   No murmur heard. Pulmonary/Chest: Effort normal and breath sounds normal. No respiratory distress.  Abdominal: Soft. Bowel sounds are normal.    Musculoskeletal: Normal range of motion. He exhibits no edema.  Right AKA  Neurological: He is alert. Coordination normal.  Skin: Skin is warm and dry.  Psychiatric: Mood, memory, affect and judgment normal.      Labs reviewed: Basic Metabolic Panel:  Recent Labs  09/29/12 0600 09/30/12 0613 05/12/13 1032  NA 133* 135 138  K 3.8 3.5 4.2  CL 96 97 103  CO2 26 27 26   GLUCOSE 171* 146* 238*  BUN 8 7 13   CREATININE 0.79 0.84 1.0  CALCIUM 8.9 8.9 9.6   Liver Function Tests:  Recent Labs  08/04/12 1030 09/09/12 1137 09/27/12 1310  AST 26 34 32  ALT 33 40 30  ALKPHOS 103 91 101  BILITOT 0.4 0.2* 0.2*  PROT 7.5 7.5 7.4  ALBUMIN 4.1 4.4 4.2    Recent Labs  08/04/12 1030  LIPASE 38   No results found for this basename: AMMONIA,  in the last 8760 hours CBC:  Recent Labs  08/04/12  1030  09/20/12 0944  09/28/12 1548 09/29/12 0600 09/30/12 0613  WBC 7.1  < > 7.3  < > 11.8* 9.4 10.0  NEUTROABS 3.5  --  3.6  --   --   --   --   HGB 13.7  < > 13.3  < > 13.2 12.7* 12.4*  HCT 38.6*  < > 38.3*  < > 38.5* 37.6* 36.6*  MCV 84.1  < > 84.7  < > 84.4 85.1 85.7  PLT 282  < > 256  < > 241 237 217  < > = values in this interval not displayed. Cardiac Enzymes:  Recent Labs  09/09/12 2108 09/10/12 0630 09/10/12 1203  CKTOTAL 113 74 93  CKMB 2.5 2.3 2.2  TROPONINI <0.30 <0.30 <0.30   BNP: No components found with this basename: POCBNP,  CBG:  Recent Labs  10/03/12 2106 10/04/12 0703 10/04/12 1138  GLUCAP 182* 168* 133*   TSH: No results found for this basename: TSH,  in the last 8760 hours A1C: Lab Results  Component Value Date   HGBA1C 7.7* 09/09/2012   Lipid Panel:  Recent Labs  06/23/12 0600  CHOL 143  HDL 38*  LDLCALC 69  TRIG 181*  CHOLHDL 3.8   Comprehensive Metabolic Panel    Result: 02/01/2013 10:00 AM   ( Status: F )     C Sodium 139     135-145 mEq/L SLN   Potassium 4.2     3.5-5.3 mEq/L SLN   Chloride 102     96-112 mEq/L SLN    CO2 25     19-32 mEq/L SLN   Glucose 201   H 70-99 mg/dL SLN   BUN 14     6-23 mg/dL SLN   Creatinine 0.88     0.50-1.35 mg/dL SLN   Bilirubin, Total 0.3     0.2-1.2 mg/dL SLN C Alkaline Phosphatase 105     39-117 U/L SLN   AST/SGOT 21     0-37 U/L SLN   ALT/SGPT 36     0-53 U/L SLN   Total Protein 6.8     6.0-8.3 g/dL SLN   Albumin 4.3     3.5-5.2 g/dL SLN   Calcium 9.9     8.4-10.5 mg/dL SLN   Lipid Profile    Result: 02/01/2013 10:00 AM   ( Status: F )       Cholesterol 133     0-200 mg/dL SLN C Triglyceride 212   H <150 mg/dL SLN   HDL Cholesterol 44     >39 mg/dL SLN   Total Chol/HDL Ratio 3.0      Ratio SLN   VLDL Cholesterol (Calc) 42   H 0-40 mg/dL SLN   LDL Cholesterol (Calc) 47     0-99 mg/dL SLN  cMP with Estimated GFR    Result: 05/10/2013 10:01 AM   ( Status: F )     C Sodium 135     135-145 mEq/L SLN   Potassium 3.9     3.5-5.3 mEq/L SLN   Chloride 98     96-112 mEq/L SLN   CO2 25     19-32 mEq/L SLN   Glucose 115   H 70-99 mg/dL SLN   BUN 12     6-23 mg/dL SLN   Creatinine 0.86     0.50-1.35 mg/dL SLN   Calcium 9.2     8.4-10.5 mg/dL SLN   Est GFR, African American >89  mL/min SLN   Est GFR, NonAfrican American >89       Assessment/Plan 1. Unspecified essential hypertension -stable on lisinopril hctz  2. Type II or unspecified type diabetes mellitus with peripheral circulatory disorders, uncontrolled(250.72) A1c of 10.4  -fasting blood sugars elevated over 150s, no hypoglycemic episodes -will increase levemir to 40 units and change SSI to 5 units for blood sugars 150-300 and 10 units for over 300 -conts metformin 3. PVD (peripheral vascular disease) -conts to follow up with vascular, conts on plavix  4. Neuroma -ortho referral for neuroma has been written  5. Insomnia conts to use melatonin for sleep

## 2013-06-09 ENCOUNTER — Ambulatory Visit (INDEPENDENT_AMBULATORY_CARE_PROVIDER_SITE_OTHER): Payer: Medicaid Other | Admitting: Cardiovascular Disease

## 2013-06-09 ENCOUNTER — Encounter: Payer: Self-pay | Admitting: Cardiovascular Disease

## 2013-06-09 VITALS — BP 131/79 | HR 90 | Ht 65.0 in

## 2013-06-09 DIAGNOSIS — E1159 Type 2 diabetes mellitus with other circulatory complications: Secondary | ICD-10-CM

## 2013-06-09 DIAGNOSIS — E785 Hyperlipidemia, unspecified: Secondary | ICD-10-CM

## 2013-06-09 DIAGNOSIS — D3610 Benign neoplasm of peripheral nerves and autonomic nervous system, unspecified: Secondary | ICD-10-CM

## 2013-06-09 DIAGNOSIS — I739 Peripheral vascular disease, unspecified: Secondary | ICD-10-CM

## 2013-06-09 DIAGNOSIS — D219 Benign neoplasm of connective and other soft tissue, unspecified: Secondary | ICD-10-CM

## 2013-06-09 NOTE — Progress Notes (Signed)
Patient ID: Javier Quinn, male   DOB: 1960-06-10, 53 y.o.   MRN: [id number] 53 yo smoker who quit a month ago with PVD. Needs RLE surgery with Dr Bridgett Larsson. Has 1 block claudication right worse than left with resting ABI:s in the .5 to .6 range. No nonhealing ulcers. Lots of pain and swelling in feet. No history of CAD. Still works detailing cars. CRF;s poorly controlled DM and HTN quit smoking a month ago. No chest pain but limited activity. Denies dyspnea palpitations or syncope  05/27/12 myovue normal EF 50%  Surgery done 7/41 with no complications  1. Right superficial femoral artery to peroneal artery bypass with ipsilateral non-reverse greater saphenous vein  2. Open superficial femoral artery cannulation  3. Right leg runoff  Has not had good result Still with lots of pain and LE swelling ABI .79 on right and .56 on left. Venous duplex 08/04/12 no DVT  Subsequently had to have right AKA in September.  [place] recently and has pain in right shoulder with limited ROM  Has painful mass over right wrist since second surgery   Saw  "chinese" hand doctor ??  Had neuroma "sucked" out but cam back Still with shoulder pain Some pain in sole of left foot    ROS: Denies fever, malais, weight loss, blurry vision, decreased visual acuity, cough, sputum, SOB, hemoptysis, pleuritic pain, palpitaitons, heartburn, abdominal pain, melena, lower extremity edema, claudication, or rash.  All other systems reviewed and negative  General: Affect appropriate Chronically ill black male in wheel chair  HEENT: normal Neck supple with no adenopathy JVP normal no bruits no thyromegaly Lungs clear with no wheezing and good diaphragmatic motion Heart:  S1/S2 no murmur, no rub, gallop or click PMI normal Abdomen: benighn, BS positve, no tenderness, no AAA no bruit.  No HSM or HJR S/P right above knee amputation  No edema Neuro non-focal Skin warm and dry  Neuroma right wrist  No muscular weakness   Current  Outpatient Prescriptions  Medication Sig Dispense Refill  . albuterol (PROVENTIL HFA;VENTOLIN HFA) 108 (90 BASE) MCG/ACT inhaler Inhale 2 puffs into the lungs every 4 (four) hours as needed for wheezing.  1 each  0  . atorvastatin (LIPITOR) 10 MG tablet Take 40 mg by mouth daily.       . clopidogrel (PLAVIX) 75 MG tablet Take 1 tablet (75 mg total) by mouth daily with breakfast.  30 tablet  6  . docusate sodium (COLACE) 100 MG capsule Take 100 mg by mouth 2 (two) times daily.       [name] gabapentin (NEURONTIN) 300 MG capsule Take 1 capsule (300 mg total) by mouth 3 (three) times daily.  90 capsule  11  . insulin detemir (LEVEMIR) 100 UNIT/ML injection Inject 30 Units into the skin at bedtime.       . insulin lispro (HUMALOG) 100 UNIT/ML injection SSI TID with meals  10 mL    . latanoprost (XALATAN) 0.005 % ophthalmic solution Place 1 drop into both eyes at bedtime.      [name] lisinopril-hydrochlorothiazide (PRINZIDE,ZESTORETIC) 20-12.5 MG per tablet Take 1 tablet by mouth daily.  30 tablet  4  . Melatonin 3 MG TABS Take 1 tablet by mouth at bedtime.      . metFORMIN (GLUCOPHAGE-XR) 500 MG 24 hr tablet Take 1,000 mg by mouth 2 (two) times daily.       [name] oxyCODONE (ROXICODONE) 5 MG immediate release tablet Take 2 tablets (10 mg total) by mouth every 4 (  four) hours as needed.  30 tablet  0  . traMADol (ULTRAM) 50 MG tablet Take one to two tablets by mouth every 6 hours as needed for pain  240 tablet  5   No current facility-administered medications for this visit.    Allergies  Codeine  Electrocardiogram:  SR rate 85 insig Q waves 3, F poor R wave progression   Assessment and Plan

## 2013-06-09 NOTE — Assessment & Plan Note (Signed)
Will refer back to hand surgeon as it has returned post procedure

## 2013-06-09 NOTE — Assessment & Plan Note (Signed)
Cholesterol is at goal.  Continue current dose of statin and diet Rx.  No myalgias or side effects.  F/U  LFT's in 6 months. Lab Results  Component Value Date   LDLCALC 69 06/23/2012

## 2013-06-09 NOTE — Patient Instructions (Signed)
Your physician recommends that you schedule a follow-up appointment in: NEEDS  FOLLOW UP  WITH  DR  Bridgett Larsson   RE  LEFT LEG  PAIN  Your physician wants you to follow-up in:   [place] will receive a reminder letter in the mail two months in advance. If you don't receive a letter, please call our office to schedule the follow-up appointment. Your physician recommends that you continue on your current medications as directed. Please refer to the Current Medication list given to you today.

## 2013-06-09 NOTE — Assessment & Plan Note (Signed)
More pain in right foot  F/u Dr Bridgett Larsson  Skin intact  With no ulcers

## 2013-06-09 NOTE — Assessment & Plan Note (Signed)
Discussed low carb diet.  Target hemoglobin A1c is 6.5 or less.  Continue current medications.  

## 2013-06-15 ENCOUNTER — Other Ambulatory Visit: Payer: Self-pay | Admitting: *Deleted

## 2013-06-15 ENCOUNTER — Encounter: Payer: Self-pay | Admitting: Vascular Surgery

## 2013-06-15 DIAGNOSIS — M79609 Pain in unspecified limb: Secondary | ICD-10-CM

## 2013-06-16 ENCOUNTER — Ambulatory Visit (HOSPITAL_COMMUNITY)
Admission: RE | Admit: 2013-06-16 | Discharge: 2013-06-16 | Disposition: A | Discharge status: Home / Self Care | Payer: Medicaid Other | Source: Ambulatory Visit | Attending: Vascular Surgery | Admitting: Vascular Surgery

## 2013-06-16 ENCOUNTER — Ambulatory Visit (INDEPENDENT_AMBULATORY_CARE_PROVIDER_SITE_OTHER): Payer: Medicaid Other | Admitting: Vascular Surgery

## 2013-06-16 ENCOUNTER — Ambulatory Visit: Payer: Medicaid Other | Admitting: Vascular Surgery

## 2013-06-16 ENCOUNTER — Encounter: Payer: Self-pay | Admitting: Vascular Surgery

## 2013-06-16 VITALS — BP 126/86 | HR 85 | Resp 18 | Ht 72.0 in | Wt 216.0 lb

## 2013-06-16 DIAGNOSIS — S78111A Complete traumatic amputation at level between right hip and knee, initial encounter: Secondary | ICD-10-CM

## 2013-06-16 DIAGNOSIS — M79609 Pain in unspecified limb: Secondary | ICD-10-CM

## 2013-06-16 DIAGNOSIS — S78119A Complete traumatic amputation at level between unspecified hip and knee, initial encounter: Secondary | ICD-10-CM

## 2013-06-16 DIAGNOSIS — I70219 Atherosclerosis of native arteries of extremities with intermittent claudication, unspecified extremity: Secondary | ICD-10-CM

## 2013-06-16 DIAGNOSIS — I739 Peripheral vascular disease, unspecified: Secondary | ICD-10-CM

## 2013-06-16 NOTE — Progress Notes (Signed)
Established Previous Bypass  History of Present Illness  Javier Quinn is a 53 y.o. (05/28/1960) male who presents with chief complaint: Left foot pain.  Previous operation(s) completed include: failed R SFA to peroneal bypass, PTA distal anastomosis, R AKA.  The patient's symptoms have progressed.  The patient's symptoms are: neuropathic pain in L foot and diffuse numbness if L foot.  The patient has problems ambulating due to pain in his L foot during rehab session with his L AKA prosthetic.  The patient's treatment regimen currently included: maximal medical management.  The patient's PMH, PSH, SH, FamHx, Med, and Allergies are unchanged from 02/17/13.  On ROS today: no rest pain, no ulcer or gangrene  Physical Examination  Filed Vitals:   06/16/13 1300  BP: 126/86  Pulse: 85  Resp: 18  Height: 6' (1.829 m)  Weight: 216 lb (97.977 kg)   Body mass index is 29.29 kg/(m^2).  Physical Examination  Filed Vitals:   06/16/13 1300  BP: 126/86  Pulse: 85  Resp: 18  Height: 6' (1.829 m)  Weight: 216 lb (97.977 kg)   Body mass index is 29.29 kg/(m^2).  General: A&O x 3, WDWN   Pulmonary: Sym exp, good air movt, CTAB, no rales, rhonchi, & wheezing   Cardiac: RRR, Nl S1, S2, no Murmurs, rubs or gallops   Vascular:  Vessel  Right  Left   Radial  Palpable  Palpable   Brachial  Palpable  Palpable   Carotid  Palpable, without bruit  Palpable, without bruit   Aorta  Not palpable  N/A   Femoral  Palpable  Palpable   Popliteal  AKA  Not palpable   PT  AKA  Not Palpable   DP  AKA  Not Palpable    Gastrointestinal: soft, NTND, -G/R, - HSM, - masses, - CVAT B   Musculoskeletal: M/S 5/5 throughout , R AKA , L foot viable with some mild dep. rubor, no gangrene or ulcers , no sensation in foot  Neurologic: Pain and light touch intact in extremities , Motor exam as listed above  ABI (Date: 06/16/2013)  R: AKA  L: 0.75 (0.76), DP: mono, TBI: 0.59   Medical Decision  Making  Javier Quinn is a 53 y.o. male who presents with: s/p R AKA, known LLE tibial disease, diabetic neuropathy.   I suspect most of this patient's sx are related to his diabetic neuropathy.  He has known L tibial disease but his TBI is nearly normal at 0.59.  His ABI are falsely elevated due to medial calcification.  Based on the patient's vascular studies and examination, I have offered the patient: BLE ABI in 3 months.  If his sx worsen, I would proceed with LLE angiography with possible orbital atherectomy and angioplasty of his tibial artery disease.  I discussed in depth with the patient the nature of atherosclerosis, and emphasized the importance of maximal medical management including strict control of blood pressure, blood glucose, and lipid levels, obtaining regular exercise, and cessation of smoking.  The patient is aware that without maximal medical management the underlying atherosclerotic disease process will progress, limiting the benefit of any interventions.  I discussed in depth with the patient the need for long term surveillance to improve the primary assisted patency of his bypass.  The patient agrees to cooperate with such.  The patient is scheduled for the previous listed surveillance studies in 3 months. The patient is currently on a statin: Lipitor. The patient is currently  on an anti-platelet: ASA.  Thank you for allowing [place] to participate in this patient's care.  Adele Barthel, MD [facility] Office: [phone] Pager: [phone]  06/16/2013, 1:34 PM

## 2013-06-20 ENCOUNTER — Non-Acute Institutional Stay (SKILLED_NURSING_FACILITY): Payer: Medicaid Other | Admitting: Internal Medicine

## 2013-06-20 DIAGNOSIS — M719 Bursopathy, unspecified: Secondary | ICD-10-CM

## 2013-06-20 DIAGNOSIS — M7551 Bursitis of right shoulder: Secondary | ICD-10-CM

## 2013-06-20 DIAGNOSIS — M67919 Unspecified disorder of synovium and tendon, unspecified shoulder: Secondary | ICD-10-CM

## 2013-06-27 NOTE — Progress Notes (Addendum)
Patient ID: Javier Quinn, male   DOB: March 12, 1960, 53 y.o.   MRN: [id number]                  PROGRESS NOTE  DATE:  06/20/2013    FACILITY: [facility]    LEVEL OF CARE:   SNF   Acute Visit   CHIEF COMPLAINT:  Follow up right shoulder pain.    HISTORY OF PRESENT ILLNESS:  Javier Quinn is a gentleman who has had chronic pain in the right shoulder.  He appears to have had shoulder injections by [employer] sometime in March, although I do not see that specifically stated.  I do see that he was suggested for NSAIDs.    In any case, he tells me that he has been "working out".  He has developed very significant pain in the right shoulder, making it difficult for him to move his arm.  I have been asked to look at this.    PHYSICAL EXAMINATION:   MUSCULOSKELETAL:   EXTREMITIES:   RIGHT UPPER EXTREMITY:  Right shoulder:  There may be some wasting of the deltoid on the right versus the left, but this is not extreme.  He has decent range of motion in all ranges of motion.  The anterior flexion of the shoulder appears to be painful.  On palpation, there is no effusion.  There is marked tenderness over the subacromial bursa.  This could either be bursitis or subacromial tendinitis.  He has had previous x-rays of the shoulder that were negative x2 outside of him going to Orthopedics.    ASSESSMENT/PLAN:  Right shoulder pain.  I suspect this is either a bursitis or a tendinitis.   In reviewing the notes from [facility] in April, an MRI was scheduled although I do not exactly see that this was done, at least not in the [facility] system.  He did have injections for [facility] joint arthritis, although his point tenderness anteriorly would dictate that this is probably not an arthritis issue.  He does not have any overt contraindications to a short trial of NSAIDs.   I will start him on Celebrex.

## 2013-07-03 ENCOUNTER — Encounter: Payer: Self-pay | Admitting: Nurse Practitioner

## 2013-07-03 ENCOUNTER — Non-Acute Institutional Stay (SKILLED_NURSING_FACILITY): Payer: Medicaid Other | Admitting: Nurse Practitioner

## 2013-07-03 DIAGNOSIS — M6740 Ganglion, unspecified site: Secondary | ICD-10-CM

## 2013-07-03 DIAGNOSIS — E1159 Type 2 diabetes mellitus with other circulatory complications: Secondary | ICD-10-CM

## 2013-07-03 DIAGNOSIS — M7551 Bursitis of right shoulder: Secondary | ICD-10-CM

## 2013-07-03 DIAGNOSIS — I739 Peripheral vascular disease, unspecified: Secondary | ICD-10-CM

## 2013-07-03 DIAGNOSIS — M719 Bursopathy, unspecified: Secondary | ICD-10-CM

## 2013-07-03 DIAGNOSIS — G4700 Insomnia, unspecified: Secondary | ICD-10-CM

## 2013-07-03 DIAGNOSIS — M67919 Unspecified disorder of synovium and tendon, unspecified shoulder: Secondary | ICD-10-CM

## 2013-07-03 DIAGNOSIS — I10 Essential (primary) hypertension: Secondary | ICD-10-CM

## 2013-07-03 NOTE — Progress Notes (Signed)
Patient ID: Javier Quinn, male   DOB: 1960/12/12, 53 y.o.   MRN: [id number] Patient ID: Javier Quinn, male   DOB: [birth year], 53 y.o.   MRN: [id number]    Nursing Home Location:  [place] of Service: SNF (31)  PCP: Javier Chessman, MD  Allergies  Allergen Reactions  . Codeine Other (See Comments)    Causes constipation    Chief Complaint  Patient presents with  . Medical Management of Chronic Issues    HPI:  Javier Quinn is a 53 y.o. male with a pmh of PVD, T2DM, Htn, ASCVD, and Right AKAs seen today for medical management and routine follow-up of multiple medical problems . He conts to have right shoulder pain that he relates to self-propeling and rehab (crutch walking) that improves with Celebrex.Last month Dr. Dellia Quinn saw and him and thought this was most likely a tendinitis/bursitis. There was a question of whether he has had an MRI to this shoulder and from what I can tell he has not. He is being followed by ortho for this and also for a ganglion cyst to his right wrist. He reports not sleeping well due to the fact that his bed is uncomfortable. He reports that the melatonin is not helping.  Review of Systems:  Review of Systems  Constitutional: Negative for fever, weight loss, malaise/fatigue and diaphoresis.  HENT: Negative for congestion.   Eyes: Negative for blurred vision.  Respiratory: Negative for cough and shortness of breath.   Cardiovascular: Negative for chest pain, palpitations, claudication and leg swelling.  Gastrointestinal: Negative for nausea, vomiting, abdominal pain and constipation.  Genitourinary: Negative for dysuria.  Musculoskeletal: Positive for joint pain and myalgias. Negative for falls.  Neurological: Negative for dizziness, tingling, sensory change, weakness and headaches.  Endo/Heme/Allergies: Negative for polydipsia.  Psychiatric/Behavioral: Negative for depression. The patient is not nervous/anxious.      Past Medical  History  Diagnosis Date  . Diabetes mellitus without complication   . Hyperlipidemia   . Tobacco dependence   . GERD (gastroesophageal reflux disease)   . H/O hiatal hernia   . Peripheral vascular disease   . Hypertension     See's Dr Javier Quinn States   . PVD (peripheral vascular disease) with claudication 05/18/2012  . Atherosclerotic peripheral vascular disease with intermittent claudication 05/18/2012  . Unspecified essential hypertension 05/18/2012  . Type II or unspecified type diabetes mellitus with peripheral circulatory disorders, uncontrolled(250.72) 10/11/2012  . Dyslipidemia 08/10/2012   Past Surgical History  Procedure Laterality Date  . Bypass graft femoral-peroneal Right 06/15/2012    Procedure: BYPASS GRAFT Campbell Stall;  Surgeon: Conrad Montfort, MD;  Location: [place];  Service: Vascular;  Laterality: Right;  using right non-reversed saphenous vein  . Lower extremity angiogram Right 06/15/2012    Procedure: LOWER EXTREMITY ANGIOGRAM;  Surgeon: Conrad Buchanan, MD;  Location: [place];  Service: Vascular;  Laterality: Right;  . Lower extremity angiogram Right 09/09/2012    Procedure: LOWER EXTREMITY ANGIOGRAM With Agioplasty Bypass Graft;  Surgeon: Conrad [place], MD;  Location: [place];  Service: Vascular;  Laterality: Right;  . Amputation Right 09/28/2012    Procedure: AMPUTATION ABOVE KNEE;  Surgeon: Conrad , MD;  Location: [place];  Service: Vascular;  Laterality: Right;   Social History:   reports that he quit smoking about 14 months ago. His smoking use included Cigarettes. He has a 10 pack-year smoking history. He has quit using smokeless tobacco. He reports that he  does not drink alcohol or use illicit drugs.  Family History  Problem Relation Age of Onset  . Cancer Mother     pancreas  . Cancer Father     Medications: Patient's Medications  New Prescriptions   No medications on file  Previous Medications   ALBUTEROL (PROVENTIL HFA;VENTOLIN HFA) 108 (90 BASE) MCG/ACT  INHALER    Inhale 2 puffs into the lungs every 4 (four) hours as needed for wheezing.   ATORVASTATIN (LIPITOR) 10 MG TABLET    Take 40 mg by mouth daily.    CLOPIDOGREL (PLAVIX) 75 MG TABLET    Take 1 tablet (75 mg total) by mouth daily with breakfast.   DOCUSATE SODIUM (COLACE) 100 MG CAPSULE    Take 100 mg by mouth 2 (two) times daily.    GABAPENTIN (NEURONTIN) 300 MG CAPSULE    Take 1 capsule (300 mg total) by mouth 3 (three) times daily.   INSULIN DETEMIR (LEVEMIR) 100 UNIT/ML INJECTION    Inject 40 Units into the skin at bedtime.    INSULIN LISPRO (HUMALOG) 100 UNIT/ML INJECTION    SSI TID with meals   LATANOPROST (XALATAN) 0.005 % OPHTHALMIC SOLUTION    Place 1 drop into both eyes at bedtime.   LISINOPRIL-HYDROCHLOROTHIAZIDE (PRINZIDE,ZESTORETIC) 20-12.5 MG PER TABLET    Take 1 tablet by mouth daily.   MELATONIN 3 MG TABS    Take 1 tablet by mouth at bedtime.   METFORMIN (GLUCOPHAGE-XR) 500 MG 24 HR TABLET    Take 1,000 mg by mouth 2 (two) times daily.    OXYCODONE (ROXICODONE) 5 MG IMMEDIATE RELEASE TABLET    Take 2 tablets (10 mg total) by mouth every 4 (four) hours as needed.   TRAMADOL (ULTRAM) 50 MG TABLET    Take one to two tablets by mouth every 6 hours as needed for pain  Modified Medications   No medications on file  Discontinued Medications   No medications on file     Physical Exam:  Filed Vitals:   07/03/13 1246  BP: 147/87  Pulse: 87  Temp: 97.4 F (36.3 C)  Resp: 20  Weight: 219 lb (99.338 kg)  SpO2: 97%   Physical Exam  Nursing note and vitals reviewed. Constitutional: He is well-developed, well-nourished, and in no distress.  HENT:  Head: Normocephalic and atraumatic.  Mouth/Throat: Mucous membranes are not pale. Normal dentition. No dental abscesses or dental caries. No oropharyngeal exudate.  Eyes: Pupils are equal, round, and reactive to light.  Neck: Normal range of motion. Neck supple.  Cardiovascular: Normal rate, regular rhythm and normal heart  sounds.  Exam reveals no gallop and no friction rub.   No murmur heard. No pedal pulse on the left.  Edema on the LLE +2  Pulmonary/Chest: Effort normal and breath sounds normal. No respiratory distress.  Decreased breath sounds  Abdominal: Soft. Bowel sounds are normal.  Musculoskeletal: He exhibits tenderness. He exhibits no edema.  Right AKA. Right shoulder point tenderness to the [facility] area and pain with ROM  Neurological: He is alert. Coordination normal.  Skin: Skin is warm and dry.  Psychiatric: Mood, memory, affect and judgment normal.   Diagnostic Exam reviewed: 03/2013: Right shoulder xray: normal right shoulder 04/18/13: 2.4cm ganglion cyst originating from the radiocarpal joint just distal to the radioscaphocapitate ligament Labs reviewed: Basic Metabolic Panel:  Recent Labs  09/29/12 0600 09/30/12 0613 05/12/13 1032  NA 133* 135 138  K 3.8 3.5 4.2  CL 96 97 103  CO2 26  27 26  GLUCOSE 171* 146* 238*  BUN 8 7 13   CREATININE 0.79 0.84 1.0  CALCIUM 8.9 8.9 9.6   Liver Function Tests:  Recent Labs  08/04/12 1030 09/09/12 1137 09/27/12 1310  AST 26 34 32  ALT 33 40 30  ALKPHOS 103 91 101  BILITOT 0.4 0.2* 0.2*  PROT 7.5 7.5 7.4  ALBUMIN 4.1 4.4 4.2    Recent Labs  08/04/12 1030  LIPASE 38   No results found for this basename: AMMONIA,  in the last 8760 hours CBC:  Recent Labs  08/04/12 1030  09/20/12 0944  09/28/12 1548 09/29/12 0600 09/30/12 0613  WBC 7.1  < > 7.3  < > 11.8* 9.4 10.0  NEUTROABS 3.5  --  3.6  --   --   --   --   HGB 13.7  < > 13.3  < > 13.2 12.7* 12.4*  HCT 38.6*  < > 38.3*  < > 38.5* 37.6* 36.6*  MCV 84.1  < > 84.7  < > 84.4 85.1 85.7  PLT 282  < > 256  < > 241 237 217  < > = values in this interval not displayed. Cardiac Enzymes:  Recent Labs  09/09/12 2108 09/10/12 0630 09/10/12 1203  CKTOTAL 113 74 93  CKMB 2.5 2.3 2.2  TROPONINI <0.30 <0.30 <0.30   BNP: No components found with this basename: POCBNP,   CBG:  Recent Labs  10/03/12 2106 10/04/12 0703 10/04/12 1138  GLUCAP 182* 168* 133*   TSH: No results found for this basename: TSH,  in the last 8760 hours A1C: Lab Results  Component Value Date   HGBA1C 7.7* 09/09/2012   Lipid Panel: No results found for this basename: CHOL, HDL, LDLCALC, TRIG, CHOLHDL, LDLDIRECT,  in the last 8760 hours Comprehensive Metabolic Panel    Result: 02/01/2013 10:00 AM   ( Status: F )     C Sodium 139     135-145 mEq/L SLN   Potassium 4.2     3.5-5.3 mEq/L SLN   Chloride 102     96-112 mEq/L SLN   CO2 25     19-32 mEq/L SLN   Glucose 201   H 70-99 mg/dL SLN   BUN 14     6-23 mg/dL SLN   Creatinine 0.88     0.50-1.35 mg/dL SLN   Bilirubin, Total 0.3     0.2-1.2 mg/dL SLN C Alkaline Phosphatase 105     39-117 U/L SLN   AST/SGOT 21     0-37 U/L SLN   ALT/SGPT 36     0-53 U/L SLN   Total Protein 6.8     6.0-8.3 g/dL SLN   Albumin 4.3     3.5-5.2 g/dL SLN   Calcium 9.9     8.4-10.5 mg/dL SLN   Lipid Profile    Result: 02/01/2013 10:00 AM   ( Status: F )       Cholesterol 133     0-200 mg/dL SLN C Triglyceride 212   H <150 mg/dL SLN   HDL Cholesterol 44     >39 mg/dL SLN   Total Chol/HDL Ratio 3.0      Ratio SLN   VLDL Cholesterol (Calc) 42   H 0-40 mg/dL SLN   LDL Cholesterol (Calc) 47     0-99 mg/dL SLN  cMP with Estimated GFR    Result: 05/10/2013 10:01 AM   ( Status: F )     C Sodium 135  135-145 mEq/L SLN   Potassium 3.9     3.5-5.3 mEq/L SLN   Chloride 98     96-112 mEq/L SLN   CO2 25     19-32 mEq/L SLN   Glucose 115   H 70-99 mg/dL SLN   BUN 12     6-23 mg/dL SLN   Creatinine 0.86     0.50-1.35 mg/dL SLN   Calcium 9.2     8.4-10.5 mg/dL SLN   Est GFR, African American >89      mL/min SLN   Est GFR, NonAfrican American >89       Assessment/Plan 1. Unspecified essential hypertension -slightly elevated. Check BP/Pulse daily for 1 week and report.  2. Type II or unspecified type diabetes mellitus with peripheral circulatory  disorders, uncontrolled(250.72) A1c of 10.4  -fasting blood sugars elevated over 150s, no hypoglycemic episodes -will increase levemir to 45 units and cont SSI.  -conts metformin 3. PVD (peripheral vascular disease) -conts to follow up with vascular, conts on plavix. Needs strict control of BP and lifestyle factors.  4. Ganglion cyst -not sure of the plan of care with ortho. Will ask staff to follow up on this.  5. Insomnia D/C Melatonin, due to tx failure.  6. Right shoulder pain Celebrex 100mg  BID for 2 weeks. He has completed PT for this problem and has previously had injections with minimal relief. It will be important for him to have as much upper body strength as possible due to his R AKA . May need to follow up with ortho if no improvement Hassell Done NP/Christina Melvyn Novas RN, MSN 07/03/13

## 2013-07-24 ENCOUNTER — Non-Acute Institutional Stay (SKILLED_NURSING_FACILITY): Payer: Medicaid Other | Admitting: Nurse Practitioner

## 2013-07-24 DIAGNOSIS — Z87891 Personal history of nicotine dependence: Secondary | ICD-10-CM

## 2013-07-24 DIAGNOSIS — I10 Essential (primary) hypertension: Secondary | ICD-10-CM

## 2013-07-24 DIAGNOSIS — I739 Peripheral vascular disease, unspecified: Secondary | ICD-10-CM

## 2013-07-24 DIAGNOSIS — E1159 Type 2 diabetes mellitus with other circulatory complications: Secondary | ICD-10-CM

## 2013-07-24 DIAGNOSIS — M25519 Pain in unspecified shoulder: Secondary | ICD-10-CM

## 2013-07-24 DIAGNOSIS — M25511 Pain in right shoulder: Secondary | ICD-10-CM

## 2013-07-24 NOTE — Progress Notes (Signed)
Patient ID: Javier Quinn, male   DOB: 08-Mar-1960, 53 y.o.   MRN: [id number]    Nursing Home Location:  [place] of Service: SNF (31)  PCP: Javier Chessman, MD  Allergies  Allergen Reactions  . Codeine Other (See Comments)    Causes constipation    Chief Complaint  Patient presents with  . Medical Management of Chronic Issues    HPI:  Javier Quinn is a 53 y.o. male with a pmh of PVD, T2DM, Htn, ASCVD, and Right AKAs seen today for medical management and routine follow-up of multiple medical problems . He conts to have right shoulder pain that he relates to self-propeling and rehab (crutch walking) that improves with Celebrex which was short term. Pt reports tylenol helps greatly to relieve pain. Pt also notes increase sputum production after he takes gabapentin. Report this has been going on for about a year. Hx of smoking and does note a cough. Denies shortness of breath or fever.   Review of Systems:  Review of Systems  Constitutional: Negative for fever, weight loss, malaise/fatigue and diaphoresis.  HENT: Negative for congestion.   Eyes: Negative for blurred vision.  Respiratory: Positive for cough and sputum production. Negative for shortness of breath and wheezing.        Clear sputum  Cardiovascular: Negative for chest pain, palpitations, claudication and leg swelling.  Gastrointestinal: Negative for heartburn, abdominal pain, diarrhea and constipation.  Genitourinary: Negative for dysuria.  Musculoskeletal: Positive for joint pain and myalgias. Negative for falls.  Skin: Negative.   Neurological: Negative for dizziness, tingling, sensory change, weakness and headaches.  Endo/Heme/Allergies: Negative for polydipsia.  Psychiatric/Behavioral: Negative for depression. The patient is not nervous/anxious.      Past Medical History  Diagnosis Date  . Diabetes mellitus without complication   . Hyperlipidemia   . Tobacco dependence   . GERD  (gastroesophageal reflux disease)   . H/O hiatal hernia   . Peripheral vascular disease   . Hypertension     See's Dr Johnsie Cancel States   . PVD (peripheral vascular disease) with claudication 05/18/2012  . Atherosclerotic peripheral vascular disease with intermittent claudication 05/18/2012  . Unspecified essential hypertension 05/18/2012  . Type II or unspecified type diabetes mellitus with peripheral circulatory disorders, uncontrolled(250.72) 10/11/2012  . Dyslipidemia 08/10/2012   Past Surgical History  Procedure Laterality Date  . Bypass graft femoral-peroneal Right 06/15/2012    Procedure: BYPASS GRAFT Campbell Stall;  Surgeon: Conrad Las Ollas, MD;  Location: [place];  Service: Vascular;  Laterality: Right;  using right non-reversed saphenous vein  . Lower extremity angiogram Right 06/15/2012    Procedure: LOWER EXTREMITY ANGIOGRAM;  Surgeon: Conrad [place] Buechel, MD;  Location: [place];  Service: Vascular;  Laterality: Right;  . Lower extremity angiogram Right 09/09/2012    Procedure: LOWER EXTREMITY ANGIOGRAM With Agioplasty Bypass Graft;  Surgeon: Conrad [place], MD;  Location: [place];  Service: Vascular;  Laterality: Right;  . Amputation Right 09/28/2012    Procedure: AMPUTATION ABOVE KNEE;  Surgeon: Conrad [place], MD;  Location: [place];  Service: Vascular;  Laterality: Right;   Social History:   reports that he quit smoking about 15 months ago. His smoking use included Cigarettes. He has a 10 pack-year smoking history. He has quit using smokeless tobacco. He reports that he does not drink alcohol or use illicit drugs.  Family History  Problem Relation Age of Onset  . Cancer Mother     pancreas  .  Cancer Father     Medications: Patient's Medications  New Prescriptions   No medications on file  Previous Medications   ALBUTEROL (PROVENTIL HFA;VENTOLIN HFA) 108 (90 BASE) MCG/ACT INHALER    Inhale 2 puffs into the lungs every 4 (four) hours as needed for wheezing.   ATORVASTATIN (LIPITOR) 10 MG TABLET     Take 40 mg by mouth daily.    CLOPIDOGREL (PLAVIX) 75 MG TABLET    Take 1 tablet (75 mg total) by mouth daily with breakfast.   DOCUSATE SODIUM (COLACE) 100 MG CAPSULE    Take 100 mg by mouth 2 (two) times daily.    GABAPENTIN (NEURONTIN) 300 MG CAPSULE    Take 1 capsule (300 mg total) by mouth 3 (three) times daily.   INSULIN DETEMIR (LEVEMIR) 100 UNIT/ML INJECTION    Inject 45 Units into the skin at bedtime.    INSULIN LISPRO (HUMALOG) 100 UNIT/ML INJECTION    SSI TID with meals   LATANOPROST (XALATAN) 0.005 % OPHTHALMIC SOLUTION    Place 1 drop into both eyes at bedtime.   LISINOPRIL-HYDROCHLOROTHIAZIDE (PRINZIDE,ZESTORETIC) 20-12.5 MG PER TABLET    Take 1 tablet by mouth daily.   MELATONIN 3 MG TABS    Take 1 tablet by mouth at bedtime.   METFORMIN (GLUCOPHAGE-XR) 500 MG 24 HR TABLET    Take 1,000 mg by mouth 2 (two) times daily.    OXYCODONE (ROXICODONE) 5 MG IMMEDIATE RELEASE TABLET    Take 2 tablets (10 mg total) by mouth every 4 (four) hours as needed.   TRAMADOL (ULTRAM) 50 MG TABLET    Take one to two tablets by mouth every 6 hours as needed for pain  Modified Medications   No medications on file  Discontinued Medications   No medications on file     Physical Exam:  Filed Vitals:   07/24/13 1415  BP: 140/87  Pulse: 80  Temp: 97.9 F (36.6 C)  Resp: 20  Weight: 225 lb (102.059 kg)   Physical Exam Nursing note and vitals reviewed.  Constitutional: He is well-developed, well-nourished, and in no distress.  HENT:  Head: Normocephalic and atraumatic.  Mouth/Throat: Mucous membranes are not pale. Normal dentition. No dental abscesses or dental caries. No oropharyngeal exudate.  Eyes: Pupils are equal, round, and reactive to light.  Neck: Normal range of motion. Neck supple.  Cardiovascular: Normal rate, regular rhythm and normal heart sounds. Exam reveals no gallop and no friction rub.  No murmur heard. No pedal pulse on the left. Edema on the LLE +1 Pulmonary/Chest:  Effort normal and breath sounds normal. No respiratory distress.  Decreased breath sounds  Abdominal: Soft. Bowel sounds are normal.  Musculoskeletal: He exhibits tenderness. He exhibits no edema.  Right AKA. continues with right shoulder point tenderness to the [facility] area and pain with ROM  Neurological: He is alert. Coordination normal.  Skin: Skin is warm and dry.  Psychiatric: Mood, memory, affect and judgment normal.     Labs reviewed: Basic Metabolic Panel:  Recent Labs  09/29/12 0600 09/30/12 0613 05/12/13 1032  NA 133* 135 138  K 3.8 3.5 4.2  CL 96 97 103  CO2 26 27 26   GLUCOSE 171* 146* 238*  BUN 8 7 13   CREATININE 0.79 0.84 1.0  CALCIUM 8.9 8.9 9.6   Liver Function Tests:  Recent Labs  08/04/12 1030 09/09/12 1137 09/27/12 1310  AST 26 34 32  ALT 33 40 30  ALKPHOS 103 91 101  BILITOT 0.4 0.2*  0.2*  PROT 7.5 7.5 7.4  ALBUMIN 4.1 4.4 4.2    Recent Labs  08/04/12 1030  LIPASE 38   No results found for this basename: AMMONIA,  in the last 8760 hours CBC:  Recent Labs  08/04/12 1030  09/20/12 0944  09/28/12 1548 09/29/12 0600 09/30/12 0613  WBC 7.1  < > 7.3  < > 11.8* 9.4 10.0  NEUTROABS 3.5  --  3.6  --   --   --   --   HGB 13.7  < > 13.3  < > 13.2 12.7* 12.4*  HCT 38.6*  < > 38.3*  < > 38.5* 37.6* 36.6*  MCV 84.1  < > 84.7  < > 84.4 85.1 85.7  PLT 282  < > 256  < > 241 237 217  < > = values in this interval not displayed. Cardiac Enzymes:  Recent Labs  09/09/12 2108 09/10/12 0630 09/10/12 1203  CKTOTAL 113 74 93  CKMB 2.5 2.3 2.2  TROPONINI <0.30 <0.30 <0.30   BNP: No components found with this basename: POCBNP,  CBG:  Recent Labs  10/03/12 2106 10/04/12 0703 10/04/12 1138  GLUCAP 182* 168* 133*   TSH: No results found for this basename: TSH,  in the last 8760 hours A1C: Lab Results  Component Value Date   HGBA1C 7.7* 09/09/2012   Comprehensive Metabolic Panel  Result: 4/56/2563 10:00 AM ( Status: F ) C  Sodium 139  135-145 mEq/L SLN  Potassium 4.2 3.5-5.3 mEq/L SLN  Chloride 102 96-112 mEq/L SLN  CO2 25 19-32 mEq/L SLN  Glucose 201 H 70-99 mg/dL SLN  BUN 14 6-23 mg/dL SLN  Creatinine 0.88 0.50-1.35 mg/dL SLN  Bilirubin, Total 0.3 0.2-1.2 mg/dL SLN C  Alkaline Phosphatase 105 39-117 U/L SLN  AST/SGOT 21 0-37 U/L SLN  ALT/SGPT 36 0-53 U/L SLN  Total Protein 6.8 6.0-8.3 g/dL SLN  Albumin 4.3 3.5-5.2 g/dL SLN  Calcium 9.9 8.4-10.5 mg/dL SLN  Lipid Profile  Result: 02/01/2013 10:00 AM ( Status: F )  Cholesterol 133 0-200 mg/dL SLN C  Triglyceride 212 H <150 mg/dL SLN  HDL Cholesterol 44 >39 mg/dL SLN  Total Chol/HDL Ratio 3.0 Ratio SLN  VLDL Cholesterol (Calc) 42 H 0-40 mg/dL SLN  LDL Cholesterol (Calc) 47 0-99 mg/dL SLN  cMP with Estimated GFR  Result: 05/10/2013 10:01 AM ( Status: F ) C  Sodium 135 135-145 mEq/L SLN  Potassium 3.9 3.5-5.3 mEq/L SLN  Chloride 98 96-112 mEq/L SLN  CO2 25 19-32 mEq/L SLN  Glucose 115 H 70-99 mg/dL SLN  BUN 12 6-23 mg/dL SLN  Creatinine 0.86 0.50-1.35 mg/dL SLN  Calcium 9.2 8.4-10.5 mg/dL SLN  Est GFR, African American >89 mL/min SLN  Est GFR, NonAfrican American >89    Assessment/Plan 1. Type II or unspecified type diabetes mellitus with peripheral circulatory disorders, uncontrolled(250.72) -blood sugars reviewed, fasting 160s-200s. Have improved but not at goal -will increase levemir to 50 units qhs -will get A1c  2. Unspecified essential hypertension -blood pressure reviewed and with frequent blood pressure over 150s -will dc combo lisinopril hctz and increase lisinopril to 40 mg daily and cont hctz 12.5 daily -will follow up bmp in 2 weeks   3. PVD (peripheral vascular disease) -following with vein and vascular, with stable but worsen LE pain.   4. Right shoulder pain celebrex was helping however due to multiple co-morbidities will not use long term. -tylenol helping pain, will schedule 650 mg twice daily  5. History of tobacco abuse -with  increased cough and congestion, reports this is on-going -will get chest xray

## 2013-08-01 ENCOUNTER — Encounter: Payer: Self-pay | Admitting: Internal Medicine

## 2013-08-01 ENCOUNTER — Non-Acute Institutional Stay (SKILLED_NURSING_FACILITY): Payer: Medicaid Other | Admitting: Internal Medicine

## 2013-08-01 DIAGNOSIS — M25511 Pain in right shoulder: Secondary | ICD-10-CM

## 2013-08-01 DIAGNOSIS — D3610 Benign neoplasm of peripheral nerves and autonomic nervous system, unspecified: Secondary | ICD-10-CM

## 2013-08-01 DIAGNOSIS — M25519 Pain in unspecified shoulder: Secondary | ICD-10-CM

## 2013-08-01 DIAGNOSIS — I10 Essential (primary) hypertension: Secondary | ICD-10-CM

## 2013-08-01 DIAGNOSIS — E1159 Type 2 diabetes mellitus with other circulatory complications: Secondary | ICD-10-CM

## 2013-08-01 DIAGNOSIS — D219 Benign neoplasm of connective and other soft tissue, unspecified: Secondary | ICD-10-CM

## 2013-08-01 NOTE — Progress Notes (Signed)
Patient ID: Javier Quinn, male   DOB: [birth year], 53 y.o.   MRN: [id number]     Nursing Home Location:  [place] of Service: SNF (31)  PCP: Angelica Chessman, MD  Allergies  Allergen Reactions  . Codeine Other (See Comments)    Causes constipation   Chief Complaint  Patient presents with  . f/u DM2, HTN, Shoulder/hand pain   HPI:  Javier Quinn is a 53 y.o. male with a pmh of PVD, T2DM, Htn, ASCVD, and Right AKA. We were asked to see him today due to continued elevated blood sugars and BP. His BP has ranged 154-168/70-80.  Last week his Lisinopril was increased to 40mg  with some improvement. His blood sugars have been elevated and last week his Levemir was increased to 55 units.  His blood sugars have ranged 137-197 in the morning and 151-228 in the evening. His A1C returned at 9.9 which was improved. He also reports continued pain in his right shoulder. He has had several course of Celebrex and states that this helps but once the course is completed the pain returns. He reports that he has had injects to his shoulder in the past. He reports pain in his right wrist as well.  He has  An MRI of his right wrist in April which showed a 2.4 cm ganglion cyst.  He has an AKA and uses his upper body to move himself to the [facility].   Review of Systems:   Constitutional: Negative for fever, weight loss, malaise/fatigue and diaphoresis.  HENT: Negative for congestion.   Eyes: Negative for blurred vision.  Respiratory: Negative for cough and shortness of breath.   Cardiovascular: Negative for chest pain, palpitations, claudication. Some edema to LLE  Gastrointestinal: Negative for nausea, vomiting, abdominal pain and constipation.  Genitourinary: Negative for dysuria.  Musculoskeletal: Positive for joint pain and myalgias. Negative for falls.  Neurological: Negative for dizziness, tingling, sensory change, weakness and headaches.  Endo/Heme/Allergies: Negative for polydipsia.    Psychiatric/Behavioral: Negative for depression. The patient is not nervous/anxious.      Past Medical History  Diagnosis Date  . Diabetes mellitus without complication   . Hyperlipidemia   . Tobacco dependence   . GERD (gastroesophageal reflux disease)   . H/O hiatal hernia   . Peripheral vascular disease   . Hypertension     See's Dr Johnsie Cancel States   . PVD (peripheral vascular disease) with claudication 05/18/2012  . Atherosclerotic peripheral vascular disease with intermittent claudication 05/18/2012  . Unspecified essential hypertension 05/18/2012  . Type II or unspecified type diabetes mellitus with peripheral circulatory disorders, uncontrolled(250.72) 10/11/2012  . Dyslipidemia 08/10/2012   Past Surgical History  Procedure Laterality Date  . Bypass graft femoral-peroneal Right 06/15/2012    Procedure: BYPASS GRAFT Campbell Stall;  Surgeon: Conrad [place], MD;  Location: [place];  Service: Vascular;  Laterality: Right;  using right non-reversed saphenous vein  . Lower extremity angiogram Right 06/15/2012    Procedure: LOWER EXTREMITY ANGIOGRAM;  Surgeon: Conrad [place], MD;  Location: [place];  Service: Vascular;  Laterality: Right;  . Lower extremity angiogram Right 09/09/2012    Procedure: LOWER EXTREMITY ANGIOGRAM With Agioplasty Bypass Graft;  Surgeon: Conrad Vassar, MD;  Location: [place];  Service: Vascular;  Laterality: Right;  . Amputation Right 09/28/2012    Procedure: AMPUTATION ABOVE KNEE;  Surgeon: Conrad St[place], MD;  Location: [place];  Service: Vascular;  Laterality: Right;   Social History:  reports that he quit smoking about 14 months ago. His smoking use included Cigarettes. He has a 10 pack-year smoking history. He has quit using smokeless tobacco. He reports that he does not drink alcohol or use illicit drugs.  Family History  Problem Relation Age of Onset  . Cancer Mother     pancreas  . Cancer Father    Medications  Outpatient Encounter Prescriptions as of 08/01/2013   Medication Sig  . hydrochlorothiazide (MICROZIDE) 12.5 MG capsule Take 25 mg by mouth daily.   [name] lisinopril (PRINIVIL,ZESTRIL) 40 MG tablet Take 40 mg by mouth daily.  [name] albuterol (PROVENTIL HFA;VENTOLIN HFA) 108 (90 BASE) MCG/ACT inhaler Inhale 2 puffs into the lungs every 4 (four) hours as needed for wheezing.  [name] atorvastatin (LIPITOR) 10 MG tablet Take 40 mg by mouth daily.   . clopidogrel (PLAVIX) 75 MG tablet Take 1 tablet (75 mg total) by mouth daily with breakfast.  . docusate sodium (COLACE) 100 MG capsule Take 100 mg by mouth 2 (two) times daily.   [name] gabapentin (NEURONTIN) 300 MG capsule Take 1 capsule (300 mg total) by mouth 3 (three) times daily.  . insulin detemir (LEVEMIR) 100 UNIT/ML injection Inject 55 Units into the skin at bedtime.   . insulin lispro (HUMALOG) 100 UNIT/ML injection SSI TID with meals  . latanoprost (XALATAN) 0.005 % ophthalmic solution Place 1 drop into both eyes at bedtime.  [name] lisinopril-hydrochlorothiazide (PRINZIDE,ZESTORETIC) 20-12.5 MG per tablet Take 1 tablet by mouth daily.  . Melatonin 3 MG TABS Take 1 tablet by mouth at bedtime.  . metFORMIN (GLUCOPHAGE-XR) 500 MG 24 hr tablet Take 1,000 mg by mouth 2 (two) times daily.   [name] oxyCODONE (ROXICODONE) 5 MG immediate release tablet Take 2 tablets (10 mg total) by mouth every 4 (four) hours as needed.  . traMADol (ULTRAM) 50 MG tablet Take one to two tablets by mouth every 6 hours as needed for pain    Physical Exam  Filed Vitals:   08/01/13 0952  BP: 168/80  Pulse: 75  Resp: 20   Constitutional: He is well-developed, well-nourished, and in no distress.  HENT:  Head: Normocephalic and atraumatic.  Mouth/Throat: Mucous membranes are not pale. Normal dentition. No dental abscesses or dental caries. No oropharyngeal exudate.  Eyes: Pupils are equal, round, and reactive to light.  Neck: Normal range of motion. Neck supple.  Cardiovascular: Normal rate, regular rhythm and normal heart sounds.  Exam  reveals no gallop and no friction rub.   No murmur heard. No pedal pulse on the left.  Edema on the LLE +1  Pulmonary/Chest: Effort normal and breath sounds normal. No respiratory distress.  Decreased breath sounds  Abdominal: Soft. Bowel sounds are normal.  Musculoskeletal: He exhibits tenderness. He exhibits no edema.  Right AKA. Right shoulder point tenderness to the [facility] area and pain with ROM  Neurological: He is alert. Coordination normal.  Skin: Skin is warm and dry.  Psychiatric: Mood, memory, affect and judgment normal.   Diagnostic Exam reviewed: 03/2013: Right shoulder xray: normal right shoulder 04/18/13: 2.4cm ganglion cyst originating from the radiocarpal joint just distal to the radioscaphocapitate ligament  LABS: 07/26/13: WBC 6.5, Hgb 14, MCV 81, Plt 291, A1C 9.9 05/10/13: Na 135, K 3.9, Cl 98, CO2 25, glucose 115, BUN 12, Cr 0.86, Ca 9.2  Assessment/Plan  1. Unspecified essential hypertension -Increase HCTZ to 25mg  daily, BMP already ordered  2. Type II or unspecified type diabetes mellitus with peripheral circulatory disorders, uncontrolled(250.72) Slight improvement,  A1C 9.9. Increase Levemir to 55 units daily. Cont Metformin at 1G BID. May need to change SSI at next visit.  3. Right shoulder pain He has had several courses of Celebrex but due to his uncontrolled BP we will hold off on tx with NSAIDS. I have asked the staff to f/u with the ortho referral ordered in May.  It will be important for him to maintain mobility to his upper body for his own independence due to his right AKA  4. Right wrist ganglion cyst Continues to report pain, see above plan   Patient is reviewed and discussed with Royal Hawthorn MSN. We've adjusted the Levemir to 55 units at. We have also reviewed the issue of an orthopedic referral with regards to the right wrist. With regards to diabetes once we have control with a fasting blood sugar will need to routine a.c. short-acting insulin  regimen.

## 2013-08-15 ENCOUNTER — Other Ambulatory Visit (HOSPITAL_COMMUNITY): Payer: Self-pay | Admitting: Orthopaedic Surgery

## 2013-08-15 ENCOUNTER — Encounter (HOSPITAL_COMMUNITY): Payer: Self-pay | Admitting: Pharmacy Technician

## 2013-08-15 ENCOUNTER — Encounter (HOSPITAL_COMMUNITY): Payer: Self-pay | Admitting: *Deleted

## 2013-08-15 ENCOUNTER — Other Ambulatory Visit: Payer: Self-pay | Admitting: Orthopaedic Surgery

## 2013-08-15 DIAGNOSIS — M25511 Pain in right shoulder: Secondary | ICD-10-CM

## 2013-08-15 MED ORDER — CEFAZOLIN SODIUM-DEXTROSE 2-3 GM-% IV SOLR
2.0000 g | INTRAVENOUS | Status: AC
  Administered 2013-08-16: 2 g via INTRAVENOUS
  Filled 2013-08-15: qty 50

## 2013-08-15 NOTE — Progress Notes (Signed)
Pt is a resident of [facility]. I called and spoke with Maudie Mercury, nurse for Mr. Banik. She verified allergies and medical history. She has faxed a copy of pt's MAR to the pharmacy. I gave her pre-op instructions for pt - NPO after MN tonight, meds that he may take in AM, time of arrival for surgery. Nursing facility will provide transportation. Maudie Mercury reports that pt is able to speak for himself and if he has anyone coming here with him tomorrow he will make those arrangements himself.

## 2013-08-16 ENCOUNTER — Ambulatory Visit (HOSPITAL_COMMUNITY)
Admission: RE | Admit: 2013-08-16 | Discharge: 2013-08-16 | Disposition: A | Discharge status: Home / Self Care | Payer: Medicaid Other | Source: Ambulatory Visit | Attending: Orthopaedic Surgery | Admitting: Orthopaedic Surgery

## 2013-08-16 ENCOUNTER — Ambulatory Visit (HOSPITAL_COMMUNITY): Payer: Medicaid Other | Admitting: Anesthesiology

## 2013-08-16 ENCOUNTER — Ambulatory Visit (HOSPITAL_COMMUNITY): Payer: Medicaid Other

## 2013-08-16 ENCOUNTER — Encounter (HOSPITAL_COMMUNITY)
Admission: RE | Disposition: A | Discharge status: Home / Self Care | Payer: Self-pay | Source: Ambulatory Visit | Attending: Orthopaedic Surgery

## 2013-08-16 ENCOUNTER — Encounter (HOSPITAL_COMMUNITY): Payer: Self-pay | Admitting: Anesthesiology

## 2013-08-16 ENCOUNTER — Other Ambulatory Visit: Payer: Self-pay | Admitting: *Deleted

## 2013-08-16 ENCOUNTER — Encounter (HOSPITAL_COMMUNITY): Payer: Medicaid Other | Admitting: Anesthesiology

## 2013-08-16 DIAGNOSIS — Z794 Long term (current) use of insulin: Secondary | ICD-10-CM | POA: Insufficient documentation

## 2013-08-16 DIAGNOSIS — S78119A Complete traumatic amputation at level between unspecified hip and knee, initial encounter: Secondary | ICD-10-CM | POA: Insufficient documentation

## 2013-08-16 DIAGNOSIS — I70219 Atherosclerosis of native arteries of extremities with intermittent claudication, unspecified extremity: Secondary | ICD-10-CM | POA: Insufficient documentation

## 2013-08-16 DIAGNOSIS — Z79899 Other long term (current) drug therapy: Secondary | ICD-10-CM | POA: Insufficient documentation

## 2013-08-16 DIAGNOSIS — Z87891 Personal history of nicotine dependence: Secondary | ICD-10-CM | POA: Diagnosis not present

## 2013-08-16 DIAGNOSIS — E785 Hyperlipidemia, unspecified: Secondary | ICD-10-CM | POA: Insufficient documentation

## 2013-08-16 DIAGNOSIS — M67439 Ganglion, unspecified wrist: Secondary | ICD-10-CM

## 2013-08-16 DIAGNOSIS — M67431 Ganglion, right wrist: Secondary | ICD-10-CM

## 2013-08-16 DIAGNOSIS — E1159 Type 2 diabetes mellitus with other circulatory complications: Secondary | ICD-10-CM | POA: Diagnosis not present

## 2013-08-16 DIAGNOSIS — K219 Gastro-esophageal reflux disease without esophagitis: Secondary | ICD-10-CM | POA: Insufficient documentation

## 2013-08-16 DIAGNOSIS — M6740 Ganglion, unspecified site: Secondary | ICD-10-CM | POA: Insufficient documentation

## 2013-08-16 DIAGNOSIS — Z885 Allergy status to narcotic agent status: Secondary | ICD-10-CM | POA: Insufficient documentation

## 2013-08-16 DIAGNOSIS — I10 Essential (primary) hypertension: Secondary | ICD-10-CM | POA: Insufficient documentation

## 2013-08-16 HISTORY — DX: Complete traumatic amputation at level between right hip and knee, initial encounter: S78.111A

## 2013-08-16 HISTORY — PX: GANGLION CYST EXCISION: SHX1691

## 2013-08-16 LAB — BASIC METABOLIC PANEL
Anion gap: 16 — ABNORMAL HIGH (ref 5–15)
BUN: 20 mg/dL (ref 6–23)
CHLORIDE: 100 meq/L (ref 96–112)
CO2: 21 meq/L (ref 19–32)
CREATININE: 0.79 mg/dL (ref 0.50–1.35)
Calcium: 9.6 mg/dL (ref 8.4–10.5)
GFR calc Af Amer: >90 mL/min (ref >90)
GFR calc non Af Amer: >90 mL/min (ref >90)
GLUCOSE: 135 mg/dL — AB (ref 70–99)
POTASSIUM: 4.3 meq/L (ref 3.7–5.3)
Sodium: 137 mEq/L (ref 137–147)

## 2013-08-16 LAB — CBC
HCT: 42 % (ref 39.0–52.0)
HEMOGLOBIN: 14.4 g/dL (ref 13.0–17.0)
MCH: 28.9 pg (ref 26.0–34.0)
MCHC: 34.3 g/dL (ref 30.0–36.0)
MCV: 84.3 fL (ref 78.0–100.0)
Platelets: 245 10*3/uL (ref 150–400)
RBC: 4.98 MIL/uL (ref 4.22–5.81)
RDW: 13 % (ref 11.5–15.5)
WBC: 6.2 10*3/uL (ref 4.0–10.5)

## 2013-08-16 LAB — GLUCOSE, CAPILLARY
Glucose-Capillary: 141 mg/dL — ABNORMAL HIGH (ref 70–99)
Glucose-Capillary: 104 mg/dL — ABNORMAL HIGH (ref 70–99)

## 2013-08-16 SURGERY — EXCISION, GANGLION CYST, WRIST
Anesthesia: General | Site: Wrist | Laterality: Right

## 2013-08-16 MED ORDER — HYDROCODONE-ACETAMINOPHEN 5-325 MG PO TABS: ORAL_TABLET | ORAL | Status: DC

## 2013-08-16 MED ORDER — BUPIVACAINE HCL 0.25 % IJ SOLN
INTRAMUSCULAR | Status: DC | PRN
  Administered 2013-08-16: 12 mL

## 2013-08-16 MED ORDER — MIDAZOLAM HCL 2 MG/2ML IJ SOLN
INTRAMUSCULAR | Status: AC
  Filled 2013-08-16: qty 2

## 2013-08-16 MED ORDER — FENTANYL CITRATE 0.05 MG/ML IJ SOLN
INTRAMUSCULAR | Status: DC | PRN
  Administered 2013-08-16 (×2): 25 ug via INTRAVENOUS
  Administered 2013-08-16: 100 ug via INTRAVENOUS

## 2013-08-16 MED ORDER — OXYCODONE HCL 5 MG PO TABS: 5.0000 mg | ORAL_TABLET | Freq: Once | ORAL | Status: DC | PRN

## 2013-08-16 MED ORDER — HYDROCODONE-ACETAMINOPHEN 5-325 MG PO TABS: 1.0000 | ORAL_TABLET | Freq: Four times a day (QID) | ORAL | Status: DC | PRN

## 2013-08-16 MED ORDER — SODIUM CHLORIDE 0.9 % IR SOLN
Status: DC | PRN
Start: 2013-08-16 — End: 2013-08-16
  Administered 2013-08-16: 1000 mL

## 2013-08-16 MED ORDER — FENTANYL CITRATE 0.05 MG/ML IJ SOLN
INTRAMUSCULAR | Status: AC
  Filled 2013-08-16: qty 5

## 2013-08-16 MED ORDER — LACTATED RINGERS IV SOLN
INTRAVENOUS | Status: DC | PRN
  Administered 2013-08-16: 10:00:00 via INTRAVENOUS

## 2013-08-16 MED ORDER — LACTATED RINGERS IV SOLN
INTRAVENOUS | Status: DC
  Administered 2013-08-16: 09:00:00 via INTRAVENOUS

## 2013-08-16 MED ORDER — OXYCODONE HCL 5 MG/5ML PO SOLN: 5.0000 mg | Freq: Once | ORAL | Status: DC | PRN

## 2013-08-16 MED ORDER — ONDANSETRON HCL 4 MG/2ML IJ SOLN: 4.0000 mg | Freq: Once | INTRAMUSCULAR | Status: DC | PRN

## 2013-08-16 MED ORDER — PROPOFOL 10 MG/ML IV BOLUS
INTRAVENOUS | Status: AC
  Filled 2013-08-16: qty 20

## 2013-08-16 MED ORDER — LIDOCAINE HCL (CARDIAC) 20 MG/ML IV SOLN
INTRAVENOUS | Status: DC | PRN
  Administered 2013-08-16: 75 mg via INTRAVENOUS

## 2013-08-16 MED ORDER — HYDROMORPHONE HCL PF 1 MG/ML IJ SOLN
0.2500 mg | INTRAMUSCULAR | Status: DC | PRN
  Administered 2013-08-16: 0.5 mg via INTRAVENOUS

## 2013-08-16 MED ORDER — PROPOFOL 10 MG/ML IV BOLUS
INTRAVENOUS | Status: DC | PRN
  Administered 2013-08-16: 150 mg via INTRAVENOUS

## 2013-08-16 MED ORDER — MEPERIDINE HCL 25 MG/ML IJ SOLN: 6.2500 mg | INTRAMUSCULAR | Status: DC | PRN

## 2013-08-16 MED ORDER — HYDROMORPHONE HCL PF 1 MG/ML IJ SOLN
INTRAMUSCULAR | Status: DC
Start: 2013-08-16 — End: 2013-08-16
  Filled 2013-08-16: qty 1

## 2013-08-16 MED ORDER — BUPIVACAINE-EPINEPHRINE (PF) 0.25% -1:200000 IJ SOLN
INTRAMUSCULAR | Status: AC
  Filled 2013-08-16: qty 30

## 2013-08-16 SURGICAL SUPPLY — 44 items
BANDAGE ELASTIC 3 VELCRO ST LF (GAUZE/BANDAGES/DRESSINGS) IMPLANT
BNDG CONFORM 3 STRL LF (GAUZE/BANDAGES/DRESSINGS) IMPLANT
BNDG GAUZE ELAST 4 BULKY (GAUZE/BANDAGES/DRESSINGS) ×1 IMPLANT
CATH URET WHISTLE 8FR 331008 (CATHETERS) ×1 IMPLANT
CORDS BIPOLAR (ELECTRODE) ×1 IMPLANT
COVER SURGICAL LIGHT HANDLE (MISCELLANEOUS) ×2 IMPLANT
CUFF TOURNIQUET SINGLE 18IN (TOURNIQUET CUFF) ×1 IMPLANT
DRAPE SURG 17X23 STRL (DRAPES) IMPLANT
DURAPREP 26ML APPLICATOR (WOUND CARE) ×2 IMPLANT
ELECT REM PT RETURN 9FT ADLT (ELECTROSURGICAL)
ELECTRODE REM PT RTRN 9FT ADLT (ELECTROSURGICAL) IMPLANT
GAUZE SPONGE 4X4 12PLY STRL (GAUZE/BANDAGES/DRESSINGS) ×2 IMPLANT
GAUZE XEROFORM 1X8 LF (GAUZE/BANDAGES/DRESSINGS) ×2 IMPLANT
GLOVE BIO SURGEON STRL SZ 6 (GLOVE) ×1 IMPLANT
GLOVE BIOGEL PI IND STRL 6.5 (GLOVE) IMPLANT
GLOVE BIOGEL PI IND STRL 7.0 (GLOVE) IMPLANT
GLOVE BIOGEL PI INDICATOR 6.5 (GLOVE) ×1
GLOVE BIOGEL PI INDICATOR 7.0 (GLOVE) ×1
GLOVE SURG SYN 7.5  E (GLOVE) ×3
GLOVE SURG SYN 7.5 E (GLOVE) ×3 IMPLANT
GLOVE SURG SYN 7.5 PF PI (GLOVE) ×2 IMPLANT
GOWN STRL REIN XL XLG (GOWN DISPOSABLE) ×2 IMPLANT
GOWN STRL REUS W/ TWL LRG LVL3 (GOWN DISPOSABLE) ×1 IMPLANT
GOWN STRL REUS W/ TWL XL LVL3 (GOWN DISPOSABLE) IMPLANT
GOWN STRL REUS W/TWL LRG LVL3 (GOWN DISPOSABLE) ×4
GOWN STRL REUS W/TWL XL LVL3 (GOWN DISPOSABLE) ×2
KIT BASIN OR (CUSTOM PROCEDURE TRAY) ×2 IMPLANT
KIT ROOM TURNOVER OR (KITS) ×2 IMPLANT
NDL HYPO 25GX1X1/2 BEV (NEEDLE) IMPLANT
NEEDLE HYPO 25GX1X1/2 BEV (NEEDLE) ×2 IMPLANT
NS IRRIG 1000ML POUR BTL (IV SOLUTION) ×4 IMPLANT
PACK ORTHO EXTREMITY (CUSTOM PROCEDURE TRAY) ×2 IMPLANT
PAD ARMBOARD 7.5X6 YLW CONV (MISCELLANEOUS) ×2 IMPLANT
PADDING CAST ABS 4INX4YD NS (CAST SUPPLIES) ×2
PADDING CAST ABS COTTON 4X4 ST (CAST SUPPLIES) ×2 IMPLANT
SPONGE GAUZE 4X4 12PLY STER LF (GAUZE/BANDAGES/DRESSINGS) ×1 IMPLANT
SUT ETHILON 4 0 PS 2 18 (SUTURE) ×2 IMPLANT
SUT VIC AB 2-0 CT1 27 (SUTURE) ×2
SUT VIC AB 2-0 CT1 TAPERPNT 27 (SUTURE) IMPLANT
SYR CONTROL 10ML LL (SYRINGE) ×1 IMPLANT
TOWEL OR 17X24 6PK STRL BLUE (TOWEL DISPOSABLE) ×2 IMPLANT
TOWEL OR 17X26 10 PK STRL BLUE (TOWEL DISPOSABLE) ×2 IMPLANT
UNDERPAD 30X30 INCONTINENT (UNDERPADS AND DIAPERS) ×4 IMPLANT
WATER STERILE IRR 1000ML POUR (IV SOLUTION) ×2 IMPLANT

## 2013-08-16 NOTE — Op Note (Signed)
Date of surgery: 08/16/2013  Preoperative diagnoses: Right volar wrist ganglion cyst  Postoperative diagnoses: Same  Procedure: Removal of right volar wrist ganglion cyst  Surgeon: Eduard Roux, M.D.  Anesthesia: Gen.  Estimated blood loss: Minimal  Complications: None  Indications for procedure: Javier Quinn is a 53 year old gentleman presents today for surgical treatment of a volar wrist ganglion cyst that has failed conservative treatment. The risks, benefits, and alternatives to surgery were again discussed with the patient he elected to proceed.  Description of procedure: The patient was identified in the preoperative holding area. The operative site was marked by the surgeon and confirmed with the patient. He is brought back to the operating room. His placed supine on the table. General anesthesia was induced. A nonsterile well-padded tourniquet was placed on the upper right arm. The right upper extremity was prepped and draped in standard sterile fashion. A timeout was performed. Pre-incisional antibiotics were given. Esmarch bandage was used to exsanguinate the extremity. The tourniquet was inflated to 275 mm mercury. A curvilinear incision based over the radial aspect of the ganglion cyst was used. Blunt dissection was taken down to the level of the ganglion cyst. Perforating veins and vessels were cauterized using bipolar cautery. The radial artery was identified and protected during the entire case. Blunt dissection was carefully performed circumferentially around the ganglion cyst. Branches of the superficial radial nerve were also identified and protected during the case. The stalk of the ganglion cyst was identified and traced down to the base. This was sharply incised. The ganglion cyst was taken out as a whole. This was sent off to pathology. The tourniquet was then deflated. The hand pinked up and there was no evidence of radial artery injury. The wound was then thoroughly irrigated with  normal saline. The stasis was obtained. The wound was closed in layer fashion using 2-0 Vicryl for the deep skin layer and interrupted 4-0 nylon for the skin. A sterile dressing was applied. The patient was extubated and transferred to the PACU in stable condition.  Disposition: The patient will be weight bear as tolerated to the right upper extremity. He is to undergo gentle range of motion of his wrist in a week. We will see him back in 2 weeks for suture removal.  Javier Cecil, MD [place] 11:07 AM

## 2013-08-16 NOTE — H&P (Signed)
PREOPERATIVE H&P  Chief Complaint: Right wrist ganglion cyst  HPI: Javier Quinn is a 53 y.o. male who presents for surgical treatment of Right wrist ganglion cyst.  He denies any changes in medical history.  Past Medical History  Diagnosis Date  . Diabetes mellitus without complication   . Hyperlipidemia   . Tobacco dependence   . GERD (gastroesophageal reflux disease)   . H/O hiatal hernia   . Peripheral vascular disease   . Hypertension     See's Dr Johnsie Cancel States   . PVD (peripheral vascular disease) with claudication 05/18/2012  . Atherosclerotic peripheral vascular disease with intermittent claudication 05/18/2012  . Unspecified essential hypertension 05/18/2012  . Type II or unspecified type diabetes mellitus with peripheral circulatory disorders, uncontrolled(250.72) 10/11/2012  . Dyslipidemia 08/10/2012  . Above knee amputation of right lower extremity    Past Surgical History  Procedure Laterality Date  . Bypass graft femoral-peroneal Right 06/15/2012    Procedure: BYPASS GRAFT Campbell Stall;  Surgeon: Conrad [place], MD;  Location: [place];  Service: Vascular;  Laterality: Right;  using right non-reversed saphenous vein  . Lower extremity angiogram Right 06/15/2012    Procedure: LOWER EXTREMITY ANGIOGRAM;  Surgeon: Conrad Jeff, MD;  Location: [place];  Service: Vascular;  Laterality: Right;  . Lower extremity angiogram Right 09/09/2012    Procedure: LOWER EXTREMITY ANGIOGRAM With Agioplasty Bypass Graft;  Surgeon: Conrad [employer], MD;  Location: [place];  Service: Vascular;  Laterality: Right;  . Amputation Right 09/28/2012    Procedure: AMPUTATION ABOVE KNEE;  Surgeon: Conrad Naples, MD;  Location: [place];  Service: Vascular;  Laterality: Right;   History   Social History  . Marital Status: Single    Spouse Name: N/A    Number of Children: N/A  . Years of Education: N/A   Social History Main Topics  . Smoking status: Former Smoker -- 0.50 packs/day for 20 years    Types:  Cigarettes    Quit date: 04/11/2012  . Smokeless tobacco: Former Systems developer  . Alcohol Use: No     Comment: occ  . Drug Use: No  . Sexual Activity: None   Other Topics Concern  . None   Social History Narrative  . None   Family History  Problem Relation Age of Onset  . Cancer Mother     pancreas  . Cancer Father    Allergies  Allergen Reactions  . Codeine Other (See Comments)    Causes constipation   Prior to Admission medications   Medication Sig Start Date End Date Taking? Authorizing Provider  acetaminophen (TYLENOL) 325 MG tablet Take 650 mg by mouth daily as needed for moderate pain.   Yes Historical Provider, MD  atorvastatin (LIPITOR) 40 MG tablet Take 40 mg by mouth daily.   Yes Historical Provider, MD  clopidogrel (PLAVIX) 75 MG tablet Take 1 tablet (75 mg total) by mouth daily with breakfast. 06/24/12  Yes Samantha J Rhyne, PA-C  docusate sodium (COLACE) 100 MG capsule Take 100 mg by mouth 2 (two) times daily.    Yes Historical Provider, MD  furosemide (LASIX) 20 MG tablet Take 20 mg by mouth daily.   Yes Historical Provider, MD  gabapentin (NEURONTIN) 300 MG capsule Take 1 capsule (300 mg total) by mouth 3 (three) times daily. 11/18/12  Yes Gerlene Fee, NP  hydrochlorothiazide (HYDRODIURIL) 25 MG tablet Take 25 mg by mouth daily.   Yes Historical Provider, MD  insulin detemir (LEVEMIR) 100 UNIT/ML injection  Inject 50 Units into the skin every evening.    Yes Historical Provider, MD  insulin lispro (HUMALOG) 100 UNIT/ML injection Inject 5-10 Units into the skin 3 (three) times daily with meals. If blood sugar is between 150-300 inject 5 units subq. If blood sugar is greater than 300 inject 10 units subq.   Yes Historical Provider, MD  latanoprost (XALATAN) 0.005 % ophthalmic solution Place 1 drop into both eyes at bedtime.   Yes Historical Provider, MD  lisinopril (PRINIVIL,ZESTRIL) 40 MG tablet Take 40 mg by mouth daily.   Yes Historical Provider, MD  metFORMIN  (GLUCOPHAGE-XR) 500 MG 24 hr tablet Take 1,000 mg by mouth 2 (two) times daily.  08/10/12  Yes Clanford Marisa Hua, MD  albuterol (PROVENTIL HFA;VENTOLIN HFA) 108 (90 BASE) MCG/ACT inhaler Inhale 2 puffs into the lungs every 4 (four) hours as needed for wheezing. 08/04/12   Ezequiel Essex, MD  Oxycodone HCl 10 MG TABS Take 10 mg by mouth every 4 (four) hours as needed (for mild to moderate pain).    Historical Provider, MD  traMADol (ULTRAM) 50 MG tablet Take 50-100 mg by mouth every 6 (six) hours as needed for moderate pain or severe pain. Take 50 mg by mouth every 6 hours as needed for mild to moderate pain. Take 100 mg by mouth every 6 hours as needed for severe pain.    Historical Provider, MD     Positive ROS: All other systems have been reviewed and were otherwise negative with the exception of those mentioned in the HPI and as above.  Physical Exam: General: Alert, no acute distress Cardiovascular: No pedal edema Respiratory: No cyanosis, no use of accessory musculature GI: abdomen is soft and non-tender Skin: No lesions in the area of chief complaint Neurologic: Sensation intact distally Psychiatric: Patient is competent for consent with normal mood and affect  MUSCULOSKELETAL:  - exam stable  Assessment: Right wrist ganglion cyst  Plan: Plan for Procedure(s): REMOVAL GANGLION OF RIGHT WRIST  The risks benefits and alternatives were discussed with the patient including but not limited to the risks of nonoperative treatment, versus surgical intervention including infection, bleeding, nerve injury,  blood clots, cardiopulmonary complications, morbidity, mortality, among others, and they were willing to proceed.   Marianna Payment, MD   08/16/2013 9:24 AM

## 2013-08-16 NOTE — Progress Notes (Signed)
Report given to Maudie Mercury at [place], as receiving caregiver

## 2013-08-16 NOTE — Transfer of Care (Signed)
Immediate Anesthesia Transfer of Care Note  Patient: Javier Quinn  Procedure(s) Performed: Procedure(s): REMOVAL GANGLION cyst  RIGHT WRIST (Right)  Patient Location: PACU  Anesthesia Type:General  Level of Consciousness: sedated and patient cooperative  Airway & Oxygen Therapy: Patient Spontanous Breathing and Patient connected to nasal cannula oxygen  Post-op Assessment: Report given to PACU RN and Post -op Vital signs reviewed and stable  Post vital signs: Reviewed and stable  Complications: No apparent anesthesia complications

## 2013-08-16 NOTE — Anesthesia Postprocedure Evaluation (Signed)
Anesthesia Post Note  Patient: Javier Quinn  Procedure(s) Performed: Procedure(s) (LRB): REMOVAL GANGLION cyst  RIGHT WRIST (Right)  Anesthesia type: general  Patient location: PACU  Post pain: Pain level controlled  Post assessment: Patient's Cardiovascular Status Stable  Last Vitals:  Filed Vitals:   08/16/13 1245  BP:   Pulse: 74  Temp:   Resp: 13    Post vital signs: Reviewed and stable  Level of consciousness: sedated  Complications: No apparent anesthesia complications

## 2013-08-16 NOTE — Anesthesia Procedure Notes (Signed)
Procedure Name: LMA Insertion Date/Time: 08/16/2013 10:14 AM Performed by: Eligha Bridegroom Pre-anesthesia Checklist: Emergency Drugs available, Timeout performed, Patient identified, Patient being monitored and Suction available Patient Re-evaluated:Patient Re-evaluated prior to inductionOxygen Delivery Method: Circle system utilized Preoxygenation: Pre-oxygenation with 100% oxygen Intubation Type: IV induction Ventilation: Mask ventilation without difficulty LMA: LMA inserted LMA Size: 5.0 Number of attempts: 1 Placement Confirmation: breath sounds checked- equal and bilateral and positive ETCO2 Tube secured with: Tape Dental Injury: Teeth and Oropharynx as per pre-operative assessment

## 2013-08-16 NOTE — Anesthesia Preprocedure Evaluation (Signed)
Anesthesia Evaluation  Patient identified by MRN, date of birth, ID band Patient awake    Reviewed: Allergy & Precautions, H&P , NPO status , Patient's Chart, lab work & pertinent test results  Airway Mallampati: I TM Distance: >3 FB Neck ROM: Full    Dental   Pulmonary former smoker,          Cardiovascular hypertension, Pt. on medications     Neuro/Psych    GI/Hepatic GERD-  Medicated and Controlled,  Endo/Other  diabetes, Well Controlled, Type 2, Insulin Dependent  Renal/GU      Musculoskeletal   Abdominal   Peds  Hematology   Anesthesia Other Findings   Reproductive/Obstetrics                           Anesthesia Physical Anesthesia Plan  ASA: III  Anesthesia Plan: General   Post-op Pain Management:    Induction: Intravenous  Airway Management Planned: LMA  Additional Equipment:   Intra-op Plan:   Post-operative Plan: Extubation in OR  Informed Consent: I have reviewed the patients History and Physical, chart, labs and discussed the procedure including the risks, benefits and alternatives for the proposed anesthesia with the patient or authorized representative who has indicated his/her understanding and acceptance.     Plan Discussed with: CRNA and Surgeon  Anesthesia Plan Comments:         Anesthesia Quick Evaluation

## 2013-08-16 NOTE — Discharge Instructions (Signed)
1. Start gentle range of motion of wrist in 1 week 2. Change surgical dressing on Friday and place gauze dressing on incision 3. Do not get incision wet until sutures are removed 4. Take pain medicines as needed

## 2013-08-16 NOTE — Telephone Encounter (Signed)
[facility] 

## 2013-08-17 ENCOUNTER — Encounter (HOSPITAL_COMMUNITY): Payer: Self-pay | Admitting: Orthopaedic Surgery

## 2013-08-18 ENCOUNTER — Ambulatory Visit: Payer: PRIVATE HEALTH INSURANCE | Admitting: Family

## 2013-08-18 ENCOUNTER — Encounter (HOSPITAL_COMMUNITY): Payer: PRIVATE HEALTH INSURANCE

## 2013-08-28 ENCOUNTER — Other Ambulatory Visit: Payer: Self-pay | Admitting: *Deleted

## 2013-08-28 ENCOUNTER — Ambulatory Visit
Admission: RE | Admit: 2013-08-28 | Discharge: 2013-08-28 | Disposition: A | Discharge status: Home / Self Care | Payer: Medicaid Other | Source: Ambulatory Visit | Attending: Orthopaedic Surgery | Admitting: Orthopaedic Surgery

## 2013-08-28 DIAGNOSIS — M25511 Pain in right shoulder: Secondary | ICD-10-CM

## 2013-08-28 MED ORDER — TRAMADOL HCL 50 MG PO TABS: ORAL_TABLET | ORAL | Status: DC

## 2013-08-28 NOTE — Telephone Encounter (Signed)
[facility] 

## 2013-09-21 ENCOUNTER — Encounter: Payer: Self-pay | Admitting: Vascular Surgery

## 2013-09-22 ENCOUNTER — Ambulatory Visit (HOSPITAL_COMMUNITY)
Admission: RE | Admit: 2013-09-22 | Discharge: 2013-09-22 | Disposition: A | Discharge status: Home / Self Care | Payer: Medicaid Other | Source: Ambulatory Visit | Attending: Vascular Surgery | Admitting: Vascular Surgery

## 2013-09-22 ENCOUNTER — Ambulatory Visit (INDEPENDENT_AMBULATORY_CARE_PROVIDER_SITE_OTHER): Payer: Medicaid Other | Admitting: Vascular Surgery

## 2013-09-22 ENCOUNTER — Encounter: Payer: Self-pay | Admitting: Vascular Surgery

## 2013-09-22 VITALS — BP 128/68 | HR 84 | Resp 18 | Ht 72.0 in | Wt 219.0 lb

## 2013-09-22 DIAGNOSIS — Z96659 Presence of unspecified artificial knee joint: Secondary | ICD-10-CM | POA: Insufficient documentation

## 2013-09-22 DIAGNOSIS — Z87891 Personal history of nicotine dependence: Secondary | ICD-10-CM | POA: Insufficient documentation

## 2013-09-22 DIAGNOSIS — S78119A Complete traumatic amputation at level between unspecified hip and knee, initial encounter: Secondary | ICD-10-CM

## 2013-09-22 DIAGNOSIS — I739 Peripheral vascular disease, unspecified: Secondary | ICD-10-CM | POA: Insufficient documentation

## 2013-09-22 DIAGNOSIS — I10 Essential (primary) hypertension: Secondary | ICD-10-CM | POA: Insufficient documentation

## 2013-09-22 DIAGNOSIS — E785 Hyperlipidemia, unspecified: Secondary | ICD-10-CM | POA: Insufficient documentation

## 2013-09-22 DIAGNOSIS — I998 Other disorder of circulatory system: Secondary | ICD-10-CM

## 2013-09-22 DIAGNOSIS — E119 Type 2 diabetes mellitus without complications: Secondary | ICD-10-CM | POA: Diagnosis not present

## 2013-09-22 DIAGNOSIS — I70229 Atherosclerosis of native arteries of extremities with rest pain, unspecified extremity: Secondary | ICD-10-CM

## 2013-09-22 DIAGNOSIS — S78111A Complete traumatic amputation at level between right hip and knee, initial encounter: Secondary | ICD-10-CM

## 2013-09-22 DIAGNOSIS — M79605 Pain in left leg: Secondary | ICD-10-CM | POA: Insufficient documentation

## 2013-09-22 DIAGNOSIS — I999 Unspecified disorder of circulatory system: Secondary | ICD-10-CM

## 2013-09-22 NOTE — Addendum Note (Signed)
Addended by: Dorthula Rue L on: 09/22/2013 02:46 PM   Modules accepted: Orders

## 2013-09-22 NOTE — Progress Notes (Signed)
Established Previous Bypass   History of Present Illness  Javier Quinn is a 53 y.o. male  (28-Jun-1960) male who presents with chief complaint: Left foot pain. Previous operation(s) completed include: failed R SFA to peroneal bypass, PTA distal anastomosis, R AKA. The patient's symptoms have progressed. The patient's symptoms are: neuropathic pain in L foot and diffuse numbness if L foot. The patient continues to have problems with his R AKA prosthetic. The patient's treatment regimen currently included: maximal medical management and rehab.   The patient's PMH, PSH, SH, FamHx, Med, and Allergies are unchanged from 06/16/13  On ROS today: no rest pain, no ulcer or gangrene   Physical Examination  Filed Vitals:   09/22/13 1149  BP: 128/68  Pulse: 84  Resp: 18  Height: 6' (1.829 m)  Weight: 219 lb (99.338 kg)   Body mass index is 29.7 kg/(m^2).  General: A&O x 3, WDWN   Pulmonary: Sym exp, good air movt, CTAB, no rales, rhonchi, & wheezing   Cardiac: RRR, Nl S1, S2, no Murmurs, rubs or gallops   Vascular:  Vessel  Right  Left   Radial  Palpable  Palpable   Brachial  Palpable  Palpable   Carotid  Palpable, without bruit  Palpable, without bruit   Aorta  Not palpable  N/A   Femoral  Palpable  Palpable   Popliteal  AKA  Not palpable   PT  AKA  Not palpable   DP  AKA  Not palpable    Gastrointestinal: soft, NTND, -G/R, - HSM, - masses, - CVAT B   Musculoskeletal: M/S 5/5 throughout , R AKA , L foot viable with some mild dep. rubor, no gangrene or ulcers , no sensation in foot   Neurologic: Pain and light touch intact in extremities except R AKA and L foot, Motor exam as listed above   ABI (Date: 09/22/2013) R: AKA  L: 0.64 (0.75), DP: mono, PT: monoTBI: 0.25  Medical Decision Making  Javier Quinn is a 53 y.o. male who presents with: s/p R AKA, known non-reconstructable LLE tibial disease, diabetic neuropathy.  I reviewed this patient L leg runoff and I don't think he has  any endovascular reconstruction options at the mid-distal peroneal is essentially obliterated despite my previous recollection. I don't think the distal target is big enough to attempt a fem-peroneal bypass.  Additionally, this patient's ABI already suggest medial calcification of the distal tibial arteries, so the distal target is likely to be too calcified for a bypass target. Based on the patient's vascular studies and examination, I have offered the patient: BLE ABI in 3 months.  I discussed in depth with the patient the nature of atherosclerosis, and emphasized the importance of maximal medical management including strict control of blood pressure, blood glucose, and lipid levels, obtaining regular exercise, and cessation of smoking. The patient is aware that without maximal medical management the underlying atherosclerotic disease process will progress, limiting the benefit of any interventions.  I discussed in depth with the patient the need for long term surveillance to improve the primary assisted patency of his bypass. The patient agrees to cooperate with such.  The patient is scheduled for the previous listed surveillance studies in 3 months. The patient is currently on a statin: Lipitor.  The patient is currently on an anti-platelet: ASA. Thank you for allowing [place] to participate in this patient's care.  Adele Barthel, MD [facility] Office: [phone] Pager: [phone]  09/22/2013, 12:58  PM

## 2013-09-25 ENCOUNTER — Non-Acute Institutional Stay (SKILLED_NURSING_FACILITY): Payer: Medicaid Other | Admitting: Nurse Practitioner

## 2013-09-25 DIAGNOSIS — G546 Phantom limb syndrome with pain: Secondary | ICD-10-CM

## 2013-09-25 DIAGNOSIS — M67431 Ganglion, right wrist: Secondary | ICD-10-CM

## 2013-09-25 DIAGNOSIS — M25511 Pain in right shoulder: Secondary | ICD-10-CM

## 2013-09-25 DIAGNOSIS — G547 Phantom limb syndrome without pain: Secondary | ICD-10-CM

## 2013-09-25 DIAGNOSIS — M6740 Ganglion, unspecified site: Secondary | ICD-10-CM

## 2013-09-25 DIAGNOSIS — I739 Peripheral vascular disease, unspecified: Secondary | ICD-10-CM

## 2013-09-25 DIAGNOSIS — G4700 Insomnia, unspecified: Secondary | ICD-10-CM

## 2013-09-25 DIAGNOSIS — E1159 Type 2 diabetes mellitus with other circulatory complications: Secondary | ICD-10-CM

## 2013-09-25 DIAGNOSIS — M25519 Pain in unspecified shoulder: Secondary | ICD-10-CM

## 2013-09-25 NOTE — Progress Notes (Signed)
Patient ID: Javier Quinn, male   DOB: September 08, 1960, 53 y.o.   MRN: [id number]    Nursing Home Location:  [place] of Service: SNF (31)  PCP: Javier Chessman, Javier Quinn  Allergies  Allergen Reactions  . Codeine Other (See Comments)    Causes constipation    Chief Complaint  Patient presents with  . Medical Management of Chronic Issues    HPI:  Javier Quinn is a 53 y.o. male with a pmh of PVD, T2DM, Htn, ASCVD, and Right AKAs seen today for medical management and routine follow-up of multiple medical issues. Since last visit pt has followed up with vascular due to non-reconstructable LLE tibial disease, diabetic neuropathy.  Pt with advanced disease and fem-peroneal bypass would not be an option. ABIs to be repeated in 3 months  Pt had ganglion cyst removed and has recovered well from that procedure. No on-going pain due to cyst removal. Still reporting increase right shoulder pain and is working with therapy due to this. (unable to tolerate any stronger pain medications)  Review of Systems:  Review of Systems  Constitutional: Negative for fever, weight loss, malaise/fatigue and diaphoresis.  HENT: Negative for congestion.   Eyes: Negative for blurred vision.  Respiratory: Negative for cough, sputum production, shortness of breath and wheezing.   Cardiovascular: Negative for chest pain, palpitations, claudication and leg swelling.  Gastrointestinal: Negative for heartburn, abdominal pain, diarrhea and constipation.  Genitourinary: Negative for dysuria.  Musculoskeletal: Positive for joint pain (to right shoulder). Negative for falls and myalgias.  Skin: Negative.   Neurological: Negative for dizziness, tingling, sensory change, weakness and headaches.  Endo/Heme/Allergies: Negative for polydipsia.  Psychiatric/Behavioral: Negative for depression. The patient has insomnia (due to bed being uncomfortable). The patient is not nervous/anxious.      Past Medical  History  Diagnosis Date  . Diabetes mellitus without complication   . Hyperlipidemia   . Tobacco dependence   . GERD (gastroesophageal reflux disease)   . H/O hiatal hernia   . Peripheral vascular disease   . Hypertension     See's Dr Johnsie Cancel States   . PVD (peripheral vascular disease) with claudication 05/18/2012  . Atherosclerotic peripheral vascular disease with intermittent claudication 05/18/2012  . Unspecified essential hypertension 05/18/2012  . Type II or unspecified type diabetes mellitus with peripheral circulatory disorders, uncontrolled(250.72) 10/11/2012  . Dyslipidemia 08/10/2012  . Above knee amputation of right lower extremity    Past Surgical History  Procedure Laterality Date  . Bypass graft femoral-peroneal Right 06/15/2012    Procedure: BYPASS GRAFT Campbell Stall;  Surgeon: Conrad Bledsoe, Javier Quinn;  Location: [place];  Service: Vascular;  Laterality: Right;  using right non-reversed saphenous vein  . Lower extremity angiogram Right 06/15/2012    Procedure: LOWER EXTREMITY ANGIOGRAM;  Surgeon: Conrad [place], Javier Quinn;  Location: [place];  Service: Vascular;  Laterality: Right;  . Lower extremity angiogram Right 09/09/2012    Procedure: LOWER EXTREMITY ANGIOGRAM With Agioplasty Bypass Graft;  Surgeon: Conrad Alliance, Javier Quinn;  Location: [place];  Service: Vascular;  Laterality: Right;  . Amputation Right 09/28/2012    Procedure: AMPUTATION ABOVE KNEE;  Surgeon: Conrad [place], Javier Quinn;  Location: [place];  Service: Vascular;  Laterality: Right;  . Ganglion cyst excision Right 08/16/2013    Procedure: REMOVAL GANGLION cyst  RIGHT WRIST;  Surgeon: Marianna Payment, Javier Quinn;  Location: [place];  Service: Orthopedics;  Laterality: Right;   Social History:   reports that he quit smoking  about 17 months ago. His smoking use included Cigarettes. He has a 10 pack-year smoking history. He has quit using smokeless tobacco. He reports that he does not drink alcohol or use illicit drugs.  Family History  Problem Relation Age  of Onset  . Cancer Mother     pancreas  . Cancer Father     Medications: Patient's Medications  New Prescriptions   No medications on file  Previous Medications   ACETAMINOPHEN (TYLENOL) 325 MG TABLET    Take 650 mg by mouth daily as needed for moderate pain.   ALBUTEROL (PROVENTIL HFA;VENTOLIN HFA) 108 (90 BASE) MCG/ACT INHALER    Inhale 2 puffs into the lungs every 4 (four) hours as needed for wheezing.   ATORVASTATIN (LIPITOR) 40 MG TABLET    Take 40 mg by mouth daily.   CLOPIDOGREL (PLAVIX) 75 MG TABLET    Take 1 tablet (75 mg total) by mouth daily with breakfast.   DOCUSATE SODIUM (COLACE) 100 MG CAPSULE    Take 100 mg by mouth 2 (two) times daily.    FUROSEMIDE (LASIX) 20 MG TABLET    Take 20 mg by mouth daily.   GABAPENTIN (NEURONTIN) 300 MG CAPSULE    Take 1 capsule (300 mg total) by mouth 3 (three) times daily.   HYDROCODONE-ACETAMINOPHEN (NORCO) 5-325 MG PER TABLET    Take one to two tablets by mouth every 6 hours as needed for pain   INSULIN DETEMIR (LEVEMIR) 100 UNIT/ML INJECTION    Inject 55 Units into the skin every evening.    INSULIN LISPRO (HUMALOG) 100 UNIT/ML INJECTION    Inject 5-10 Units into the skin 3 (three) times daily with meals. If blood sugar is between 150-300 inject 5 units subq. If blood sugar is greater than 300 inject 10 units subq.   LATANOPROST (XALATAN) 0.005 % OPHTHALMIC SOLUTION    Place 1 drop into both eyes at bedtime.   LISINOPRIL (PRINIVIL,ZESTRIL) 40 MG TABLET    Take 40 mg by mouth daily.   METFORMIN (GLUCOPHAGE-XR) 500 MG 24 HR TABLET    Take 1,000 mg by mouth 2 (two) times daily.    OXYCODONE HCL 10 MG TABS    Take 10 mg by mouth every 4 (four) hours as needed (for mild to moderate pain).   TRAMADOL (ULTRAM) 50 MG TABLET    Take one tablet by mouth every 6 hours as needed for mild to moderate pain; Take two tablets by mouth every 6 hours as needed for severe pain  Modified Medications   No medications on file  Discontinued Medications    HYDROCHLOROTHIAZIDE (HYDRODIURIL) 25 MG TABLET    Take 25 mg by mouth daily.     Physical Exam:  Filed Vitals:   09/25/13 1636  BP: 127/84  Pulse: 90  Temp: 97.5 F (36.4 C)  Resp: 20   Physical Exam Nursing note and vitals reviewed.  Constitutional: He is well-developed, well-nourished, and in no distress.  HENT:  Head: Normocephalic and atraumatic.  Mouth/Throat: Mucous membranes are not pale. Normal dentition. No dental abscesses or dental caries. No oropharyngeal exudate.  Eyes: Pupils are equal, round, and reactive to light.  Neck: Normal range of motion. Neck supple.  Cardiovascular: Normal rate, regular rhythm and normal heart sounds. Exam reveals no gallop and no friction rub.  No murmur heard. No pedal pulse on the left. Trace Edema on the LLE  Pulmonary/Chest: Effort normal and breath sounds normal. No respiratory distress.  Decreased breath sounds  Abdominal:  Soft. Bowel sounds are normal.  Musculoskeletal: He exhibits tenderness. He exhibits no edema.  Right AKA. continues with right shoulder point tenderness to the [facility] area and pain with ROM  Neurological: He is alert. Coordination normal.  Skin: Skin is warm and dry. Scar to right wrist from cyst removal  Psychiatric: Mood, memory, affect and judgment normal.     Labs reviewed: Basic Metabolic Panel:  Recent Labs  09/30/12 0613 05/12/13 1032 08/16/13 0848  NA 135 138 137  K 3.5 4.2 4.3  CL 97 103 100  CO2 27 26 21   GLUCOSE 146* 238* 135*  BUN 7 13 20   CREATININE 0.84 1.0 0.79  CALCIUM 8.9 9.6 9.6   Liver Function Tests:  Recent Labs  09/27/12 1310  AST 32  ALT 30  ALKPHOS 101  BILITOT 0.2*  PROT 7.4  ALBUMIN 4.2   No results found for this basename: LIPASE, AMYLASE,  in the last 8760 hours No results found for this basename: AMMONIA,  in the last 8760 hours CBC:  Recent Labs  09/29/12 0600 09/30/12 0613 08/16/13 0848  WBC 9.4 10.0 6.2  HGB 12.7* 12.4* 14.4  HCT 37.6* 36.6* 42.0    MCV 85.1 85.7 84.3  PLT 237 217 245   Cardiac Enzymes: No results found for this basename: CKTOTAL, CKMB, CKMBINDEX, TROPONINI,  in the last 8760 hours BNP: No components found with this basename: POCBNP,  CBG:  Recent Labs  10/04/12 1138 08/16/13 0857 08/16/13 1116  GLUCAP 133* 141* 104*   TSH: No results found for this basename: TSH,  in the last 8760 hours A1C: Lab Results  Component Value Date   HGBA1C 7.7* 09/09/2012   Comprehensive Metabolic Panel  Result: 06/26/2977 10:00 AM ( Status: F ) C  Sodium 139 135-145 mEq/L SLN  Potassium 4.2 3.5-5.3 mEq/L SLN  Chloride 102 96-112 mEq/L SLN  CO2 25 19-32 mEq/L SLN  Glucose 201 H 70-99 mg/dL SLN  BUN 14 6-23 mg/dL SLN  Creatinine 0.88 0.50-1.35 mg/dL SLN  Bilirubin, Total 0.3 0.2-1.2 mg/dL SLN C  Alkaline Phosphatase 105 39-117 U/L SLN  AST/SGOT 21 0-37 U/L SLN  ALT/SGPT 36 0-53 U/L SLN  Total Protein 6.8 6.0-8.3 g/dL SLN  Albumin 4.3 3.5-5.2 g/dL SLN  Calcium 9.9 8.4-10.5 mg/dL SLN  Lipid Profile  Result: 02/01/2013 10:00 AM ( Status: F )  Cholesterol 133 0-200 mg/dL SLN C  Triglyceride 212 H <150 mg/dL SLN  HDL Cholesterol 44 >39 mg/dL SLN  Total Chol/HDL Ratio 3.0 Ratio SLN  VLDL Cholesterol (Calc) 42 H 0-40 mg/dL SLN  LDL Cholesterol (Calc) 47 0-99 mg/dL SLN  cMP with Estimated GFR  Result: 05/10/2013 10:01 AM ( Status: F ) C  Sodium 135 135-145 mEq/L SLN  Potassium 3.9 3.5-5.3 mEq/L SLN  Chloride 98 96-112 mEq/L SLN  CO2 25 19-32 mEq/L SLN  Glucose 115 H 70-99 mg/dL SLN  BUN 12 6-23 mg/dL SLN  Creatinine 0.86 0.50-1.35 mg/dL SLN  Calcium 9.2 8.4-10.5 mg/dL SLN  Est GFR, African American >89 mL/min SLN  Est GFR, NonAfrican American >89    Assessment/Plan 1. Ganglion cyst of wrist, right Removed,  Done well post op  2. Right shoulder pain -ongoing, participating in therapy at this time   3. Type II or unspecified type diabetes mellitus with peripheral circulatory disorders,  uncontrolled(250.72) -review of blood sugars 160 fasting and over, no hypoglycemic episodes -will increase levemir to 60 units qhs   4. PVD (peripheral vascular disease) -conts to follow up  with vascular, medical management only at this time -strict control of bp, dm, lipids recommended pt does report he is attempting to eat better  5. Phantom limb pain Cont with gabapentin  6. Insomnia -exacerbated by mattress, he has informed the staff

## 2013-10-23 ENCOUNTER — Non-Acute Institutional Stay (SKILLED_NURSING_FACILITY): Payer: Medicaid Other | Admitting: Nurse Practitioner

## 2013-10-23 DIAGNOSIS — E1151 Type 2 diabetes mellitus with diabetic peripheral angiopathy without gangrene: Secondary | ICD-10-CM

## 2013-10-23 DIAGNOSIS — K5900 Constipation, unspecified: Secondary | ICD-10-CM

## 2013-10-23 DIAGNOSIS — IMO0002 Reserved for concepts with insufficient information to code with codable children: Secondary | ICD-10-CM

## 2013-10-23 DIAGNOSIS — E1159 Type 2 diabetes mellitus with other circulatory complications: Secondary | ICD-10-CM

## 2013-10-23 DIAGNOSIS — I10 Essential (primary) hypertension: Secondary | ICD-10-CM | POA: Insufficient documentation

## 2013-10-23 DIAGNOSIS — I70219 Atherosclerosis of native arteries of extremities with intermittent claudication, unspecified extremity: Secondary | ICD-10-CM

## 2013-10-23 DIAGNOSIS — G546 Phantom limb syndrome with pain: Secondary | ICD-10-CM

## 2013-10-23 DIAGNOSIS — E1165 Type 2 diabetes mellitus with hyperglycemia: Principal | ICD-10-CM

## 2013-10-23 DIAGNOSIS — M25511 Pain in right shoulder: Secondary | ICD-10-CM

## 2013-10-23 DIAGNOSIS — I739 Peripheral vascular disease, unspecified: Secondary | ICD-10-CM
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
## 2013-11-06 ENCOUNTER — Non-Acute Institutional Stay (SKILLED_NURSING_FACILITY): Payer: Medicaid Other | Admitting: Nurse Practitioner

## 2013-11-06 DIAGNOSIS — E1165 Type 2 diabetes mellitus with hyperglycemia: Principal | ICD-10-CM

## 2013-11-06 DIAGNOSIS — E1159 Type 2 diabetes mellitus with other circulatory complications: Secondary | ICD-10-CM

## 2013-11-06 DIAGNOSIS — E1151 Type 2 diabetes mellitus with diabetic peripheral angiopathy without gangrene: Secondary | ICD-10-CM

## 2013-11-06 DIAGNOSIS — IMO0002 Reserved for concepts with insufficient information to code with codable children: Secondary | ICD-10-CM

## 2013-11-06 LAB — BASIC METABOLIC PANEL
BUN: 9 mg/dL (ref 4–21)
Creatinine: 0.8 mg/dL (ref 0.6–1.3)
POTASSIUM: 4.2 mmol/L (ref 3.4–5.3)
SODIUM: 138 mmol/L (ref 137–147)

## 2013-11-06 LAB — HEMOGLOBIN A1C: HEMOGLOBIN A1C: 8.3 % — AB (ref 4.0–6.0)

## 2013-11-06 NOTE — Progress Notes (Signed)
Patient ID: Javier Quinn, male   DOB: 31-Aug-1960, 53 y.o.   MRN: [id number]    Nursing Home Location:  [place] of Service: SNF (31)  PCP: Angelica Chessman, MD  Allergies  Allergen Reactions  . Codeine Other (See Comments)    Causes constipation    Chief Complaint  Patient presents with  . Medical Management of Chronic Issues    HPI:  Javier Quinn is a 53 y.o. male with a pmh of PVD, T2DM, Htn, ASCVD, and Right AKAs seen today at the request of nursing due to elevated blood sugars after toujeo start. Upon further investigation blood sugars have actually improved. It took about 1 week but blood sugars which had initial worsened (appeared the same on review) have now improved. However lowest fasting is 135. Pt reports he is working on diet and exercise.   Review of Systems:  Review of Systems  Constitutional: Negative for fever, weight loss, malaise/fatigue and diaphoresis.  HENT: Negative for congestion.   Eyes: Negative for blurred vision.  Respiratory: Negative for cough, sputum production, shortness of breath and wheezing.   Cardiovascular: Negative for chest pain, palpitations, claudication and leg swelling.  Gastrointestinal: Negative for heartburn, abdominal pain, diarrhea and constipation.  Genitourinary: Negative for dysuria.  Musculoskeletal: Positive for joint pain (to right shoulder). Negative for myalgias and falls.  Skin: Negative.   Neurological: Negative for dizziness, tingling, sensory change, weakness and headaches.  Endo/Heme/Allergies: Negative for polydipsia.  Psychiatric/Behavioral: Negative for depression. The patient has insomnia (due to bed being uncomfortable). The patient is not nervous/anxious.      Past Medical History  Diagnosis Date  . Diabetes mellitus without complication   . Hyperlipidemia   . Tobacco dependence   . GERD (gastroesophageal reflux disease)   . H/O hiatal hernia   . Peripheral vascular disease   .  Hypertension     See's Dr Johnsie Cancel States   . PVD (peripheral vascular disease) with claudication 05/18/2012  . Atherosclerotic peripheral vascular disease with intermittent claudication 05/18/2012  . Unspecified essential hypertension 05/18/2012  . Type II or unspecified type diabetes mellitus with peripheral circulatory disorders, uncontrolled(250.72) 10/11/2012  . Dyslipidemia 08/10/2012  . Above knee amputation of right lower extremity    Past Surgical History  Procedure Laterality Date  . Bypass graft femoral-peroneal Right 06/15/2012    Procedure: BYPASS GRAFT Campbell Stall;  Surgeon: Conrad Melstone, MD;  Location: [facility];  Service: Vascular;  Laterality: Right;  using right non-reversed saphenous vein  . Lower extremity angiogram Right 06/15/2012    Procedure: LOWER EXTREMITY ANGIOGRAM;  Surgeon: Conrad [place], MD;  Location: [place];  Service: Vascular;  Laterality: Right;  . Lower extremity angiogram Right 09/09/2012    Procedure: LOWER EXTREMITY ANGIOGRAM With Agioplasty Bypass Graft;  Surgeon: Conrad [place], MD;  Location: [facility];  Service: Vascular;  Laterality: Right;  . Amputation Right 09/28/2012    Procedure: AMPUTATION ABOVE KNEE;  Surgeon: Conrad [place], MD;  Location: [facility];  Service: Vascular;  Laterality: Right;  . Ganglion cyst excision Right 08/16/2013    Procedure: REMOVAL GANGLION cyst  RIGHT WRIST;  Surgeon: Marianna Payment, MD;  Location: [facility];  Service: Orthopedics;  Laterality: Right;   Social History:   reports that he quit smoking about 18 months ago. His smoking use included Cigarettes. He has a 10 pack-year smoking history. He has quit using smokeless tobacco. He reports that he does not drink alcohol or use  illicit drugs.  Family History  Problem Relation Age of Onset  . Cancer Mother     pancreas  . Cancer Father     Medications: Patient's Medications  New Prescriptions   No medications on file  Previous Medications   ACETAMINOPHEN (TYLENOL) 325 MG TABLET     Take 650 mg by mouth daily as needed for moderate pain.   ALBUTEROL (PROVENTIL HFA;VENTOLIN HFA) 108 (90 BASE) MCG/ACT INHALER    Inhale 2 puffs into the lungs every 4 (four) hours as needed for wheezing.   ATORVASTATIN (LIPITOR) 40 MG TABLET    Take 40 mg by mouth daily.   CLOPIDOGREL (PLAVIX) 75 MG TABLET    Take 1 tablet (75 mg total) by mouth daily with breakfast.   DOCUSATE SODIUM (COLACE) 100 MG CAPSULE    Take 100 mg by mouth 2 (two) times daily.    FUROSEMIDE (LASIX) 20 MG TABLET    Take 20 mg by mouth daily.   GABAPENTIN (NEURONTIN) 300 MG CAPSULE    Take 1 capsule (300 mg total) by mouth 3 (three) times daily.   HYDROCODONE-ACETAMINOPHEN (NORCO) 5-325 MG PER TABLET    Take one to two tablets by mouth every 6 hours as needed for pain   INSULIN GLARGINE (TOUJEO SOLOSTAR) 300 UNIT/ML SOPN    Inject 64 Units into the skin at bedtime.   INSULIN LISPRO (HUMALOG) 100 UNIT/ML INJECTION    Inject 5-10 Units into the skin 3 (three) times daily with meals. If blood sugar is between 150-300 inject 5 units subq. If blood sugar is greater than 300 inject 10 units subq.   LATANOPROST (XALATAN) 0.005 % OPHTHALMIC SOLUTION    Place 1 drop into both eyes at bedtime.   LISINOPRIL (PRINIVIL,ZESTRIL) 40 MG TABLET    Take 40 mg by mouth daily.   METFORMIN (GLUCOPHAGE-XR) 500 MG 24 HR TABLET    Take 1,000 mg by mouth 2 (two) times daily.    TRAMADOL (ULTRAM) 50 MG TABLET    Take one tablet by mouth every 6 hours as needed for mild to moderate pain; Take two tablets by mouth every 6 hours as needed for severe pain  Modified Medications   No medications on file  Discontinued Medications   INSULIN DETEMIR (LEVEMIR) 100 UNIT/ML INJECTION    Inject 60 Units into the skin every evening.      Physical Exam:  Filed Vitals:   11/06/13 1309  BP: 150/80  Pulse: 68  Temp: 97.5 F (36.4 C)  Resp: 20   Physical Exam Constitutional: He is well-developed, well-nourished, and in no distress.  HENT:  Head:  Normocephalic and atraumatic.  Mouth/Throat: Mucous membranes are not pale.  Eyes: Pupils are equal, round, and reactive to light.  Neck: Normal range of motion. Neck supple.  Cardiovascular: Normal rate, regular rhythm and normal heart sounds. No murmur heard. No pedal pulse on the left. Trace Edema on the LLE; right AKA Pulmonary/Chest: Effort normal and breath sounds normal. No respiratory distress.  Decreased breath sounds  Abdominal: Soft. Bowel sounds are normal.  Neurological: He is alert Skin: Skin is warm and dry. Scar to right wrist from cyst removal  Psychiatric: Mood, memory, affect and judgment normal.     Labs reviewed: Basic Metabolic Panel:  Recent Labs  05/12/13 1032 08/16/13 0848  NA 138 137  K 4.2 4.3  CL 103 100  CO2 26 21  GLUCOSE 238* 135*  BUN 13 20  CREATININE 1.0 0.79  CALCIUM 9.6  9.6   Liver Function Tests: No results for input(s): AST, ALT, ALKPHOS, BILITOT, PROT, ALBUMIN in the last 8760 hours. No results for input(s): LIPASE, AMYLASE in the last 8760 hours. No results for input(s): AMMONIA in the last 8760 hours. CBC:  Recent Labs  08/16/13 0848  WBC 6.2  HGB 14.4  HCT 42.0  MCV 84.3  PLT 245   Cardiac Enzymes: No results for input(s): CKTOTAL, CKMB, CKMBINDEX, TROPONINI in the last 8760 hours. BNP: Invalid input(s): POCBNP CBG:  Recent Labs  08/16/13 0857 08/16/13 1116  GLUCAP 141* 104*   TSH: No results for input(s): TSH in the last 8760 hours. A1C: Lab Results  Component Value Date   HGBA1C 7.7* 09/09/2012   Comprehensive Metabolic Panel  Result: 5/80/9983 10:00 AM ( Status: F ) C  Sodium 139 135-145 mEq/L SLN  Potassium 4.2 3.5-5.3 mEq/L SLN  Chloride 102 96-112 mEq/L SLN  CO2 25 19-32 mEq/L SLN  Glucose 201 H 70-99 mg/dL SLN  BUN 14 6-23 mg/dL SLN  Creatinine 0.88 0.50-1.35 mg/dL SLN  Bilirubin, Total 0.3 0.2-1.2 mg/dL SLN C  Alkaline Phosphatase 105 39-117 U/L SLN  AST/SGOT 21 0-37 U/L SLN  ALT/SGPT 36  0-53 U/L SLN  Total Protein 6.8 6.0-8.3 g/dL SLN  Albumin 4.3 3.5-5.2 g/dL SLN  Calcium 9.9 8.4-10.5 mg/dL SLN  Lipid Profile  Result: 02/01/2013 10:00 AM ( Status: F )  Cholesterol 133 0-200 mg/dL SLN C  Triglyceride 212 H <150 mg/dL SLN  HDL Cholesterol 44 >39 mg/dL SLN  Total Chol/HDL Ratio 3.0 Ratio SLN  VLDL Cholesterol (Calc) 42 H 0-40 mg/dL SLN  LDL Cholesterol (Calc) 47 0-99 mg/dL SLN  cMP with Estimated GFR  Result: 05/10/2013 10:01 AM ( Status: F ) C  Sodium 135 135-145 mEq/L SLN  Potassium 3.9 3.5-5.3 mEq/L SLN  Chloride 98 96-112 mEq/L SLN  CO2 25 19-32 mEq/L SLN  Glucose 115 H 70-99 mg/dL SLN  BUN 12 6-23 mg/dL SLN  Creatinine 0.86 0.50-1.35 mg/dL SLN  Calcium 9.2 8.4-10.5 mg/dL SLN  Est GFR, African American >89 mL/min SLN  Est GFR, NonAfrican American >89   Hemoglobin A1C    Result: 10/25/2013 8:34 PM   ( Status: F )       Hemoglobin A1C 8.4   H <5.7 % SLN C Estimated Average Glucose 194   H <117 mg/dL SLN  Assessment/Plan  1. Type 2 diabetes, uncontrolled, with peripheral circulatory disorder -again reinforced lifestyle modifications.  -blood sugars are still high, fasting 130s and over -will increase toujeo to  67  units qhs Sq

## 2013-11-07 ENCOUNTER — Non-Acute Institutional Stay (SKILLED_NURSING_FACILITY): Payer: Medicaid Other | Admitting: Internal Medicine

## 2013-11-07 DIAGNOSIS — B351 Tinea unguium: Secondary | ICD-10-CM

## 2013-11-10 NOTE — Progress Notes (Signed)
Patient ID: Javier Quinn, male   DOB: May 14, 1960, 53 y.o.   MRN: [id number]               PROGRESS NOTE  DATE:  11/07/2013    FACILITY: [facility]    LEVEL OF CARE:   SNF   Acute Visit   CHIEF COMPLAINT:   Nail changes.    HISTORY OF PRESENT ILLNESS:  This is a 53 year-old man who was admitted here roughly a year ago after having a right above-knee amputation.  He failed attempts at revascularization and he has not left the facility.    He stopped me today to complain about nail changes including his left first toe, left thumb, and right third finger.  He has had this for some time.     PHYSICAL EXAMINATION:   NAILS:  The digits noted above:  This is clearly onychomycosis.    ASSESSMENT/PLAN:  I gave the patient an option of an attempt at topical therapy if that would be covered by his insurance, or oral drugs, versus no therapy at all.  He is really interested in trying an oral antifungal.  I think the issue is that the onychomycosis contributed To the loss of his other leg.

## 2013-11-29 ENCOUNTER — Non-Acute Institutional Stay (SKILLED_NURSING_FACILITY): Payer: Medicaid Other | Admitting: Nurse Practitioner

## 2013-11-29 DIAGNOSIS — IMO0002 Reserved for concepts with insufficient information to code with codable children: Secondary | ICD-10-CM

## 2013-11-29 DIAGNOSIS — E1151 Type 2 diabetes mellitus with diabetic peripheral angiopathy without gangrene: Secondary | ICD-10-CM

## 2013-11-29 DIAGNOSIS — G546 Phantom limb syndrome with pain: Secondary | ICD-10-CM

## 2013-11-29 DIAGNOSIS — I10 Essential (primary) hypertension: Secondary | ICD-10-CM

## 2013-11-29 DIAGNOSIS — E785 Hyperlipidemia, unspecified: Secondary | ICD-10-CM

## 2013-11-29 DIAGNOSIS — E1159 Type 2 diabetes mellitus with other circulatory complications: Secondary | ICD-10-CM

## 2013-11-29 DIAGNOSIS — I739 Peripheral vascular disease, unspecified: Secondary | ICD-10-CM

## 2013-11-29 DIAGNOSIS — K5900 Constipation, unspecified: Secondary | ICD-10-CM

## 2013-11-29 DIAGNOSIS — E1165 Type 2 diabetes mellitus with hyperglycemia: Principal | ICD-10-CM

## 2013-11-29 MED ORDER — SENNA 8.6 MG PO TABS: 1.0000 | ORAL_TABLET | Freq: Every day | ORAL | Status: DC
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
## 2013-12-12 ENCOUNTER — Non-Acute Institutional Stay (SKILLED_NURSING_FACILITY): Payer: Medicaid Other | Admitting: Internal Medicine

## 2013-12-12 DIAGNOSIS — M130 Polyarthritis, unspecified: Secondary | ICD-10-CM

## 2013-12-13 NOTE — Progress Notes (Signed)
Patient ID: Javier Quinn, male   DOB: 15-May-1960, 53 y.o.   MRN: [id number]               PROGRESS NOTE  DATE:  12/12/2013    FACILITY: [facility]    LEVEL OF CARE:   SNF   Acute Visit   CHIEF COMPLAINT:  Pain in his remaining left foot.    HISTORY OF PRESENT ILLNESS:  Javier Quinn is a type 2 diabetic whom we have had in the building, I believe, since October 2014.  At that point, he underwent a right above-knee amputation.    Over the last several days, he has been complaining of pain in the left foot.  He is scared that this is related to his known PAD and he actually tells me he has an appointment with Dr. Bridgett Larsson.  He tells me that the pain is only there when he puts weight on this.  If he is sitting or lying, he does not have any pain.  As noted, he has significant PAD.    His [facility] problem list lists pain in joints such as ankle and foot, although I do not see a more specific diagnosis and it is difficult to sort this out from his ischemic type pain.    He has been seeing Orthopedics for pain in his right shoulder, which is listed as bursitis.    PHYSICAL EXAMINATION:   MUSCULOSKELETAL:   EXTREMITIES:   LEFT LOWER EXTREMITY:  Left leg:  Although his peripheral pulses are difficult to feel (I actually think I felt a dorsalis pedis pulse), this does not seem to be an ischemic rest issue.  He has marked tenderness over his first, second, third, and fourth metatarsophalangeals on the plantar aspect.  There is also swelling of his left ankle and tenderness, especially over the medial malleolus.  There are no wound issues here.    ASSESSMENT/PLAN:                                  Polyarthritis involving the left foot.  I wonder about a crystal arthropathy here.  He is not listed as having previous arthritis, although there is a lot of comment about joint pain in his problem list.  This includes his right shoulder, wrist.  I do not see any reason why he could not tolerate a course of  nonsteroidals for 5-7 days.   We will x-ray the foot for completeness (?chondrocalcinosis).                            CPT CODE: 27035

## 2013-12-14 ENCOUNTER — Encounter (HOSPITAL_COMMUNITY): Payer: Self-pay | Admitting: Vascular Surgery

## 2013-12-19 ENCOUNTER — Non-Acute Institutional Stay (SKILLED_NURSING_FACILITY): Payer: Medicaid Other | Admitting: Internal Medicine

## 2013-12-19 DIAGNOSIS — M119 Crystal arthropathy, unspecified: Secondary | ICD-10-CM

## 2013-12-21 ENCOUNTER — Encounter: Payer: Self-pay | Admitting: Family

## 2013-12-21 NOTE — Progress Notes (Addendum)
Patient ID: Javier Quinn, male   DOB: 01-15-1960, 53 y.o.   MRN: [id number]                PROGRESS NOTE  DATE:  12/19/2013    FACILITY: [facility]    LEVEL OF CARE:   SNF   Acute Visit   CHIEF COMPLAINT:  Review of pain in his left foot.    HISTORY OF PRESENT ILLNESS:  I saw Javier Quinn last week for pain in his left foot.  He was actually concerned that this was related to known PAD, although I felt this was more likely some form of polyarticular arthritis involving his metatarsophalangeals and his ankle.  There was no history of trauma, and I did not think this was an ischemic pain issue at all.     LABS/RADIOLOGY:  An x-ray has been done of his left foot that showed a punctate avulsion of the medial malleolus, although this certainly would not explain his discomfort.  There was no evidence of active arthritis.  He did have soft arteriovascular calcifications.    PHYSICAL EXAMINATION:   VASCULAR:   ARTERIAL:  Left foot:  Once again, I think I can feel his dorsalis pedis pulse.  There is no evidence of ischemia.   MUSCULOSKELETAL:   EXTREMITIES:   LEFT LOWER EXTREMITY:  The exquisite tenderness in his metatarsophalangeals and ankle have resolved.    ASSESSMENT/PLAN:                             Some form of crystal arthropathy.  I will check a uric acid level on him although, by itself, this would not be diagnostic of gout.  I would wonder whether he might have pseudogout, as well.  Whatever this was, it responded abruptly to a short course of nonsteroidals.  I think a crystal arthropathy here would be the most likely culprit.

## 2013-12-22 ENCOUNTER — Ambulatory Visit (INDEPENDENT_AMBULATORY_CARE_PROVIDER_SITE_OTHER): Payer: Medicaid Other | Admitting: Family

## 2013-12-22 ENCOUNTER — Ambulatory Visit (HOSPITAL_COMMUNITY)
Admission: RE | Admit: 2013-12-22 | Discharge: 2013-12-22 | Disposition: A | Discharge status: Home / Self Care | Payer: Medicaid Other | Source: Ambulatory Visit | Attending: Family | Admitting: Family

## 2013-12-22 ENCOUNTER — Encounter: Payer: Self-pay | Admitting: Family

## 2013-12-22 VITALS — BP 148/104 | HR 96 | Resp 18 | Ht 72.0 in | Wt 225.0 lb

## 2013-12-22 DIAGNOSIS — Z89611 Acquired absence of right leg above knee: Secondary | ICD-10-CM

## 2013-12-22 DIAGNOSIS — I70229 Atherosclerosis of native arteries of extremities with rest pain, unspecified extremity: Secondary | ICD-10-CM

## 2013-12-22 DIAGNOSIS — S78111A Complete traumatic amputation at level between right hip and knee, initial encounter: Secondary | ICD-10-CM

## 2013-12-22 DIAGNOSIS — Z89511 Acquired absence of right leg below knee: Secondary | ICD-10-CM | POA: Diagnosis not present

## 2013-12-22 DIAGNOSIS — I70219 Atherosclerosis of native arteries of extremities with intermittent claudication, unspecified extremity: Secondary | ICD-10-CM

## 2013-12-22 DIAGNOSIS — I998 Other disorder of circulatory system: Secondary | ICD-10-CM

## 2013-12-22 DIAGNOSIS — I70209 Unspecified atherosclerosis of native arteries of extremities, unspecified extremity: Secondary | ICD-10-CM | POA: Insufficient documentation

## 2013-12-22 DIAGNOSIS — I739 Peripheral vascular disease, unspecified: Secondary | ICD-10-CM
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
## 2013-12-22 NOTE — Patient Instructions (Signed)

## 2013-12-22 NOTE — Addendum Note (Signed)
Addended by: Peter Minium K on: 12/22/2013 03:00 PM   Modules accepted: Orders

## 2014-01-01 ENCOUNTER — Non-Acute Institutional Stay (SKILLED_NURSING_FACILITY): Payer: Medicaid Other | Admitting: Nurse Practitioner

## 2014-01-01 DIAGNOSIS — E1151 Type 2 diabetes mellitus with diabetic peripheral angiopathy without gangrene: Secondary | ICD-10-CM

## 2014-01-01 DIAGNOSIS — E1165 Type 2 diabetes mellitus with hyperglycemia: Principal | ICD-10-CM

## 2014-01-01 DIAGNOSIS — M25511 Pain in right shoulder: Secondary | ICD-10-CM

## 2014-01-01 DIAGNOSIS — G546 Phantom limb syndrome with pain: Secondary | ICD-10-CM

## 2014-01-01 DIAGNOSIS — M67431 Ganglion, right wrist: Secondary | ICD-10-CM

## 2014-01-01 DIAGNOSIS — E1159 Type 2 diabetes mellitus with other circulatory complications: Secondary | ICD-10-CM

## 2014-01-01 DIAGNOSIS — I10 Essential (primary) hypertension: Secondary | ICD-10-CM

## 2014-01-01 DIAGNOSIS — I739 Peripheral vascular disease, unspecified: Secondary | ICD-10-CM

## 2014-01-01 DIAGNOSIS — I70219 Atherosclerosis of native arteries of extremities with intermittent claudication, unspecified extremity: Secondary | ICD-10-CM

## 2014-01-01 DIAGNOSIS — IMO0002 Reserved for concepts with insufficient information to code with codable children: Secondary | ICD-10-CM

## 2014-01-01 DIAGNOSIS — K5900 Constipation, unspecified: Secondary | ICD-10-CM

## 2014-01-01 NOTE — Progress Notes (Signed)
Patient ID: Javier Quinn, male   DOB: Jan 23, 1960, 53 y.o.   MRN: [id number]    Nursing Home Location:  [place] of Service: SNF (31)  PCP: Angelica Chessman, MD  Allergies  Allergen Reactions  . Codeine Other (See Comments)    Causes constipation    Chief Complaint  Patient presents with  . Medical Management of Chronic Issues    HPI:  Patient is a 53 y.o. male seen today at [facility] for routine follow up on chronic conditions. Pt with a pmh of PVD, T2DM, Htn, ASCVD, and Right AKA.  In the last month pt has see Dr Dellia Nims due to worsening left ankle pain.  Uric acid level was WNL. Responding to short steroid course but now having more problems with this when he walks on it. Has also followed up with ortho due to right shoulder pain who referred him to another doctor for a second option. Also being followed for worsening ganglion cyst.  Review of Systems:  Review of Systems  Constitutional: Negative for activity change, appetite change, fatigue and unexpected weight change.  HENT: Negative for congestion and hearing loss.   Eyes: Negative.   Respiratory: Negative for cough and shortness of breath.   Cardiovascular: Positive for leg swelling. Negative for chest pain and palpitations.  Gastrointestinal: Positive for constipation. Negative for abdominal pain and diarrhea.  Genitourinary: Negative for dysuria and difficulty urinating.  Musculoskeletal: Negative for myalgias and arthralgias.  Skin: Negative for color change and wound.  Neurological: Negative for dizziness and weakness.       Phantom pain in right AKA and leg pain in left   Psychiatric/Behavioral: Negative for behavioral problems and confusion.    Past Medical History  Diagnosis Date  . Diabetes mellitus without complication   . Hyperlipidemia   . Tobacco dependence   . GERD (gastroesophageal reflux disease)   . H/O hiatal hernia   . Peripheral vascular disease   .  Hypertension     See's Dr Johnsie Cancel States   . PVD (peripheral vascular disease) with claudication 05/18/2012  . Atherosclerotic peripheral vascular disease with intermittent claudication 05/18/2012  . Unspecified essential hypertension 05/18/2012  . Type II or unspecified type diabetes mellitus with peripheral circulatory disorders, uncontrolled(250.72) 10/11/2012  . Dyslipidemia 08/10/2012  . Above knee amputation of right lower extremity    Past Surgical History  Procedure Laterality Date  . Bypass graft femoral-peroneal Right 06/15/2012    Procedure: BYPASS GRAFT Campbell Stall;  Surgeon: Conrad Ramona, MD;  Location: [place];  Service: Vascular;  Laterality: Right;  using right non-reversed saphenous vein  . Lower extremity angiogram Right 06/15/2012    Procedure: LOWER EXTREMITY ANGIOGRAM;  Surgeon: Conrad Baywood, MD;  Location: [place];  Service: Vascular;  Laterality: Right;  . Lower extremity angiogram Right 09/09/2012    Procedure: LOWER EXTREMITY ANGIOGRAM With Agioplasty Bypass Graft;  Surgeon: Conrad Egan, MD;  Location: [place];  Service: Vascular;  Laterality: Right;  . Amputation Right 09/28/2012    Procedure: AMPUTATION ABOVE KNEE;  Surgeon: Conrad Winnsboro, MD;  Location: [place];  Service: Vascular;  Laterality: Right;  . Ganglion cyst excision Right 08/16/2013    Procedure: REMOVAL GANGLION cyst  RIGHT WRIST;  Surgeon: Marianna Payment, MD;  Location: [place];  Service: Orthopedics;  Laterality: Right;  . Abdominal aortagram N/A 05/12/2012    Procedure: ABDOMINAL AORTAGRAM;  Surgeon: Conrad Wabash, MD;  Location: [facility] CATH  LAB;  Service: Cardiovascular;  Laterality: N/A;  . Lower extremity angiogram  05/12/2012    Procedure: LOWER EXTREMITY ANGIOGRAM;  Surgeon: Conrad Ramona, MD;  Location: [facility] CATH LAB;  Service: Cardiovascular;;   Social History:   reports that he quit smoking about 20 months ago. His smoking use included Cigarettes. He has a 10 pack-year smoking history. He has quit using smokeless  tobacco. He reports that he does not drink alcohol or use illicit drugs.  Family History  Problem Relation Age of Onset  . Cancer Mother     pancreas  . Cancer Father   . Heart attack Sister     Medications: Patient's Medications  New Prescriptions   No medications on file  Previous Medications   ACETAMINOPHEN (TYLENOL) 325 MG TABLET    Take 650 mg by mouth daily as needed for moderate pain.   ALBUTEROL (PROVENTIL HFA;VENTOLIN HFA) 108 (90 BASE) MCG/ACT INHALER    Inhale 2 puffs into the lungs every 4 (four) hours as needed for wheezing.   ATORVASTATIN (LIPITOR) 40 MG TABLET    Take 40 mg by mouth daily.   CLOPIDOGREL (PLAVIX) 75 MG TABLET    Take 1 tablet (75 mg total) by mouth daily with breakfast.   DIVALPROEX (DEPAKOTE) 250 MG DR TABLET    Take 250 mg by mouth at bedtime.   DOCUSATE SODIUM (COLACE) 100 MG CAPSULE    Take 100 mg by mouth 2 (two) times daily.    FUROSEMIDE (LASIX) 20 MG TABLET    Take 20 mg by mouth daily.   GABAPENTIN (NEURONTIN) 300 MG CAPSULE    Take 1 capsule (300 mg total) by mouth 3 (three) times daily.   HYDROCODONE-ACETAMINOPHEN (NORCO) 5-325 MG PER TABLET    Take one to two tablets by mouth every 6 hours as needed for pain   INSULIN GLARGINE (TOUJEO SOLOSTAR) 300 UNIT/ML SOPN    Inject 70 Units into the skin at bedtime.   INSULIN LISPRO (HUMALOG) 100 UNIT/ML INJECTION    Inject 5-10 Units into the skin 3 (three) times daily with meals. If blood sugar is between 150-300 inject 5 units subq. If blood sugar is greater than 300 inject 10 units subq.   LATANOPROST (XALATAN) 0.005 % OPHTHALMIC SOLUTION    Place 1 drop into both eyes at bedtime.   LISINOPRIL (PRINIVIL,ZESTRIL) 40 MG TABLET    Take 40 mg by mouth daily.   METFORMIN (GLUCOPHAGE-XR) 500 MG 24 HR TABLET    Take 1,000 mg by mouth 2 (two) times daily.    SENNA (SENOKOT) 8.6 MG TABS TABLET    Take 1 tablet (8.6 mg total) by mouth daily.   TERBINAFINE (LAMISIL) 250 MG TABLET    Take 250 mg by mouth daily.     TRAMADOL (ULTRAM) 50 MG TABLET    Take one tablet by mouth every 6 hours as needed for mild to moderate pain; Take two tablets by mouth every 6 hours as needed for severe pain  Modified Medications   No medications on file  Discontinued Medications   No medications on file     Physical Exam: Filed Vitals:   01/01/14 1204  BP: 156/93  Pulse: 85  Temp: 98.5 F (36.9 C)  Resp: 20  Weight: 231 lb (104.781 kg)    Physical Exam  Constitutional: He is oriented to person, place, and time. He appears well-developed and well-nourished. No distress.  HENT:  Head: Normocephalic and atraumatic.  Mouth/Throat: Oropharynx is clear and moist.  No oropharyngeal exudate.  Eyes: Conjunctivae and EOM are normal. Pupils are equal, round, and reactive to light.  Neck: Normal range of motion. Neck supple.  Cardiovascular: Normal rate, regular rhythm and normal heart sounds.   Pulmonary/Chest: Effort normal. He has decreased breath sounds.  Abdominal: Soft. Bowel sounds are normal.  Obese   Musculoskeletal: He exhibits no tenderness. Edema: +1 on left LE.  Right AKA Small ganglion cyst to right wrist  Neurological: He is alert and oriented to person, place, and time.  Skin: Skin is warm and dry. He is not diaphoretic.  Psychiatric: He has a normal mood and affect.    Labs reviewed: Basic Metabolic Panel:  Recent Labs  05/12/13 1032 08/16/13 0848 11/06/13  NA 138 137 138  K 4.2 4.3 4.2  CL 103 100  --   CO2 26 21  --   GLUCOSE 238* 135*  --   BUN 13 20 9   CREATININE 1.0 0.79 0.8  CALCIUM 9.6 9.6  --    Liver Function Tests: No results for input(s): AST, ALT, ALKPHOS, BILITOT, PROT, ALBUMIN in the last 8760 hours. No results for input(s): LIPASE, AMYLASE in the last 8760 hours. No results for input(s): AMMONIA in the last 8760 hours. CBC:  Recent Labs  08/16/13 0848  WBC 6.2  HGB 14.4  HCT 42.0  MCV 84.3  PLT 245   TSH: No results for input(s): TSH in the last 8760  hours. A1C: Lab Results  Component Value Date   HGBA1C 8.3* 11/06/2013   Lipid Panel: No results for input(s): CHOL, HDL, LDLCALC, TRIG, CHOLHDL, LDLDIRECT in the last 8760 hours.    Assessment/Plan 1. Type 2 diabetes, uncontrolled, with peripheral circulatory disorder -remains under poor on current regimen, fasting blood sugars between 150-200 -conts on diabetic diet -will increase toujeo to 74 units  2. Essential hypertension -remains elevated, will add cardizem 30 mg BID  -conts on lisinopril and lasix   3. PVD (peripheral vascular disease) -tight blood pressure, cholesterol, blood sugar control discussed and educated with pt, conts on plavix and following with vascular for evaluation.   4. Constipation, unspecified constipation type -stable  5. Phantom limb pain -ongoing conts gabapentin  6. Dyslipidemia Will follow up lipids  7. Ganglion cyst of wrist, right Has returned, ortho has offered additional surgery   8. Right shoulder pain Going to different orthopedic for second opinion

## 2014-01-16 ENCOUNTER — Non-Acute Institutional Stay (SKILLED_NURSING_FACILITY): Payer: Medicaid Other | Admitting: Internal Medicine

## 2014-01-16 DIAGNOSIS — H6591 Unspecified nonsuppurative otitis media, right ear: Secondary | ICD-10-CM

## 2014-01-20 NOTE — Progress Notes (Signed)
Patient ID: Javier Quinn, male   DOB: October 17, 1960, 54 y.o.   MRN: [id number]               PROGRESS NOTE  DATE:  01/16/2014                      FACILITY: [facility]                    LEVEL OF CARE:   SNF   Acute Visit   CHIEF COMPLAINT:  Right ear fullness.     HISTORY OF PRESENT ILLNESS:  Javier Quinn, for the last week, has been complaining of a full feeling in his right ear.  He does not complain of hearing loss, pain, or drainage.  He states he may have had this at one point in the remote past, received a medication at an urgent care.    REVIEW OF SYSTEMS:           HEENT:  Reports some degree of nasal congestion over the last two weeks.    PHYSICAL EXAMINATION:   HEENT:   EARS:  Bilateral ears:  His left ear has some cerumen, making this difficult to see.  However, the right ear is clear.     ASSESSMENT/PLAN:   It would appear that there is a small middle right ear effusion here.  I wonder whether he has an occluded Eustachian tube.  He has blood pressure problems, although a short course of Pseudoephedrine probably is not going to be that problematic.   I will recheck this next week.

## 2014-01-23 ENCOUNTER — Non-Acute Institutional Stay (SKILLED_NURSING_FACILITY): Payer: Medicaid Other | Admitting: Internal Medicine

## 2014-01-23 DIAGNOSIS — H6591 Unspecified nonsuppurative otitis media, right ear: Secondary | ICD-10-CM

## 2014-01-24 NOTE — Progress Notes (Addendum)
Patient ID: JOHNNELL LIOU, male   DOB: [birth year], 54 y.o.   MRN: [id number]               PROGRESS NOTE  DATE:  01/23/2014                  FACILITY: [facility]    LEVEL OF CARE:   SNF   Acute Visit   CHIEF COMPLAINT:  Continued right ear complaints.    HISTORY OF PRESENT ILLNESS:  I saw Mr. Edelson last week.  He had been complaining of a full feeling in his right ear.  He did not complain of pain, hearing loss, or drainage.  He states he has had problems with this ear in the past.  He is not really complaining of hearing loss.  At some points, he actually feels like his hearing is almost hyperacute.    Last week when I saw him, I thought he had a small middle ear effusion.  I gave him a course of Sudafed.  He states that this has not really helped.    PHYSICAL EXAMINATION:        HEENT:   BILATERAL EARS:  I think, actually, that both his drums look normal.    ASSESSMENT/PLAN:                     Complaints of right ear problems, which I thought was an effusion last week.  This is clear, yet his complaints continue.  I am going to send him to ENT.

## 2014-01-29 ENCOUNTER — Non-Acute Institutional Stay (SKILLED_NURSING_FACILITY): Payer: Medicaid Other | Admitting: Nurse Practitioner

## 2014-01-29 DIAGNOSIS — E1159 Type 2 diabetes mellitus with other circulatory complications: Secondary | ICD-10-CM

## 2014-01-29 DIAGNOSIS — I10 Essential (primary) hypertension: Secondary | ICD-10-CM

## 2014-01-29 DIAGNOSIS — E1165 Type 2 diabetes mellitus with hyperglycemia: Principal | ICD-10-CM

## 2014-01-29 DIAGNOSIS — M25511 Pain in right shoulder: Secondary | ICD-10-CM

## 2014-01-29 DIAGNOSIS — IMO0002 Reserved for concepts with insufficient information to code with codable children: Secondary | ICD-10-CM

## 2014-01-29 DIAGNOSIS — I739 Peripheral vascular disease, unspecified: Secondary | ICD-10-CM

## 2014-01-29 DIAGNOSIS — K5900 Constipation, unspecified: Secondary | ICD-10-CM

## 2014-01-29 DIAGNOSIS — E1151 Type 2 diabetes mellitus with diabetic peripheral angiopathy without gangrene: Secondary | ICD-10-CM

## 2014-01-29 DIAGNOSIS — E785 Hyperlipidemia, unspecified: Secondary | ICD-10-CM

## 2014-01-29 DIAGNOSIS — G546 Phantom limb syndrome with pain: Secondary | ICD-10-CM

## 2014-01-29 NOTE — Progress Notes (Signed)
Patient ID: Javier Quinn, male   DOB: [birth year], 54 y.o.   MRN: [id number]    Nursing Home Location:  [place] of Service: SNF (31)  PCP: Angelica Chessman, MD  Allergies  Allergen Reactions  . Codeine Other (See Comments)    Causes constipation    Chief Complaint  Patient presents with  . Medical Management of Chronic Issues    HPI:  Patient is a 54 y.o. male seen today at [facility] for routine follow up on chronic conditions. Pt with a pmh of PVD, T2DM, Htn, ASCVD, and Right AKA.  In the last month pt has see Dr Dellia Nims due to worsening ear discomfort, new referral to ENT has been made.    Pts blood pressure is still not at goal. On review systolic blood pressures ranging from 136-180s. cbgs reviewed and fasting ranging from 120-200. Pt reports he is trying to be more compliant with diet. Denies any worsening pain. No headaches, blurred vision.   Review of Systems:  Review of Systems  Constitutional: Negative for activity change, appetite change, fatigue and unexpected weight change.  HENT: Negative for congestion and hearing loss.   Eyes: Negative.   Respiratory: Negative for cough and shortness of breath.   Cardiovascular: Positive for leg swelling. Negative for chest pain and palpitations.  Gastrointestinal: Positive for constipation (improved on medications). Negative for abdominal pain and diarrhea.  Genitourinary: Negative for dysuria and difficulty urinating.  Musculoskeletal: Negative for myalgias and arthralgias.  Skin: Negative for color change and wound.  Neurological: Negative for dizziness and weakness.       Phantom pain in right AKA and leg pain in left   Psychiatric/Behavioral: Negative for behavioral problems and confusion.    Past Medical History  Diagnosis Date  . Diabetes mellitus without complication   . Hyperlipidemia   . Tobacco dependence   . GERD (gastroesophageal reflux disease)   . H/O hiatal  hernia   . Peripheral vascular disease   . Hypertension     See's Dr Johnsie Cancel States   . PVD (peripheral vascular disease) with claudication 05/18/2012  . Atherosclerotic peripheral vascular disease with intermittent claudication 05/18/2012  . Unspecified essential hypertension 05/18/2012  . Type II or unspecified type diabetes mellitus with peripheral circulatory disorders, uncontrolled(250.72) 10/11/2012  . Dyslipidemia 08/10/2012  . Above knee amputation of right lower extremity    Past Surgical History  Procedure Laterality Date  . Bypass graft femoral-peroneal Right 06/15/2012    Procedure: BYPASS GRAFT Campbell Stall;  Surgeon: Conrad Kings Grant, MD;  Location: [place];  Service: Vascular;  Laterality: Right;  using right non-reversed saphenous vein  . Lower extremity angiogram Right 06/15/2012    Procedure: LOWER EXTREMITY ANGIOGRAM;  Surgeon: Conrad Senath, MD;  Location: [place];  Service: Vascular;  Laterality: Right;  . Lower extremity angiogram Right 09/09/2012    Procedure: LOWER EXTREMITY ANGIOGRAM With Agioplasty Bypass Graft;  Surgeon: Conrad Wanblee, MD;  Location: [place];  Service: Vascular;  Laterality: Right;  . Amputation Right 09/28/2012    Procedure: AMPUTATION ABOVE KNEE;  Surgeon: Conrad Lima, MD;  Location: [place];  Service: Vascular;  Laterality: Right;  . Ganglion cyst excision Right 08/16/2013    Procedure: REMOVAL GANGLION cyst  RIGHT WRIST;  Surgeon: Marianna Payment, MD;  Location: [place];  Service: Orthopedics;  Laterality: Right;  . Abdominal aortagram N/A 05/12/2012    Procedure: ABDOMINAL AORTAGRAM;  Surgeon: Conrad [place], MD;  Location: [place] CATH LAB;  Service: Cardiovascular;  Laterality: N/A;  . Lower extremity angiogram  05/12/2012    Procedure: LOWER EXTREMITY ANGIOGRAM;  Surgeon: Conrad [place], MD;  Location: [facility] CATH LAB;  Service: Cardiovascular;;   Social History:   reports that he quit smoking about 21 months ago. His smoking use included Cigarettes. He has a 10 pack-year  smoking history. He has quit using smokeless tobacco. He reports that he does not drink alcohol or use illicit drugs.  Family History  Problem Relation Age of Onset  . Cancer Mother     pancreas  . Cancer Father   . Heart attack Sister     Medications: Patient's Medications  New Prescriptions   No medications on file  Previous Medications   ACETAMINOPHEN (TYLENOL) 325 MG TABLET    Take 650 mg by mouth daily as needed for moderate pain.   ALBUTEROL (PROVENTIL HFA;VENTOLIN HFA) 108 (90 BASE) MCG/ACT INHALER    Inhale 2 puffs into the lungs every 4 (four) hours as needed for wheezing.   ATORVASTATIN (LIPITOR) 40 MG TABLET    Take 40 mg by mouth daily.   CLOPIDOGREL (PLAVIX) 75 MG TABLET    Take 1 tablet (75 mg total) by mouth daily with breakfast.   DIVALPROEX (DEPAKOTE) 250 MG DR TABLET    Take 500 mg by mouth at bedtime.    DOCUSATE SODIUM (COLACE) 100 MG CAPSULE    Take 100 mg by mouth 2 (two) times daily.    FUROSEMIDE (LASIX) 20 MG TABLET    Take 20 mg by mouth daily.   GABAPENTIN (NEURONTIN) 300 MG CAPSULE    Take 1 capsule (300 mg total) by mouth 3 (three) times daily.   HYDROCODONE-ACETAMINOPHEN (NORCO) 5-325 MG PER TABLET    Take one to two tablets by mouth every 6 hours as needed for pain   INSULIN GLARGINE (TOUJEO SOLOSTAR) 300 UNIT/ML SOPN    Inject 70 Units into the skin at bedtime.   INSULIN LISPRO (HUMALOG) 100 UNIT/ML INJECTION    Inject 5-10 Units into the skin 3 (three) times daily with meals. If blood sugar is between 150-300 inject 5 units subq. If blood sugar is greater than 300 inject 10 units subq.   LATANOPROST (XALATAN) 0.005 % OPHTHALMIC SOLUTION    Place 1 drop into both eyes at bedtime.   LISINOPRIL (PRINIVIL,ZESTRIL) 40 MG TABLET    Take 40 mg by mouth daily.   METFORMIN (GLUCOPHAGE-XR) 500 MG 24 HR TABLET    Take 1,000 mg by mouth 2 (two) times daily.    SENNA (SENOKOT) 8.6 MG TABS TABLET    Take 1 tablet (8.6 mg total) by mouth daily.   TERBINAFINE (LAMISIL)  250 MG TABLET    Take 250 mg by mouth daily.   TRAMADOL (ULTRAM) 50 MG TABLET    Take one tablet by mouth every 6 hours as needed for mild to moderate pain; Take two tablets by mouth every 6 hours as needed for severe pain  Modified Medications   No medications on file  Discontinued Medications   No medications on file     Physical Exam: Filed Vitals:   01/29/14 1248  BP: 136/76  Pulse: 80  Temp: 97.6 F (36.4 C)  Resp: 20    Physical Exam  Constitutional: He is oriented to person, place, and time. He appears well-developed and well-nourished. No distress.  HENT:  Head: Normocephalic and atraumatic.  Mouth/Throat: Oropharynx is clear and moist. No oropharyngeal exudate.  Eyes: Conjunctivae and EOM are normal. Pupils are equal, round, and reactive to light.  Neck: Normal range of motion. Neck supple.  Cardiovascular: Normal rate, regular rhythm and normal heart sounds.   Pulmonary/Chest: Effort normal. He has decreased breath sounds.  Abdominal: Soft. Bowel sounds are normal.  Obese   Musculoskeletal: He exhibits no tenderness. Edema: +1 on left LE.  Right AKA Small ganglion cyst to right wrist  Neurological: He is alert and oriented to person, place, and time.  Skin: Skin is warm and dry. He is not diaphoretic.  Psychiatric: He has a normal mood and affect.    Labs reviewed: Basic Metabolic Panel:  Recent Labs  05/12/13 1032 08/16/13 0848 11/06/13  NA 138 137 138  K 4.2 4.3 4.2  CL 103 100  --   CO2 26 21  --   GLUCOSE 238* 135*  --   BUN 13 20 9   CREATININE 1.0 0.79 0.8  CALCIUM 9.6 9.6  --    Liver Function Tests: No results for input(s): AST, ALT, ALKPHOS, BILITOT, PROT, ALBUMIN in the last 8760 hours. No results for input(s): LIPASE, AMYLASE in the last 8760 hours. No results for input(s): AMMONIA in the last 8760 hours. CBC:  Recent Labs  08/16/13 0848  WBC 6.2  HGB 14.4  HCT 42.0  MCV 84.3  PLT 245   TSH: No results for input(s): TSH in the  last 8760 hours. A1C: Lab Results  Component Value Date   HGBA1C 8.3* 11/06/2013   Lipid Panel: No results for input(s): CHOL, HDL, LDLCALC, TRIG, CHOLHDL, LDLDIRECT in the last 8760 hours.    Assessment/Plan 1. Type 2 diabetes, uncontrolled, with peripheral circulatory disorder -cont to not be controlled on current regimen however has improved  fasting blood sugars between 120-200 -conts on diabetic diet and reports better compliance  -will increase toujeo to 78 units  2. Essential hypertension -remains frequently elevated, again reinforced complaince with diet. Will change Cardizem to 120 CD PO daily -also conts on lisinopril and lasix   3. PVD (peripheral vascular disease) -reinforced blood pressure, cholesterol, blood sugar control discussed and educated with pt, conts on plavix and following with vascular for evaluation.   4. Constipation, unspecified constipation type -stable on current regiment   5. Phantom limb pain -ongoing conts gabapentin  6. Right shoulder pain Going to different orthopedic for second opinion which did not think surgery would benefit pt. Working with therapy at this time  7. Dyslipidemia  LDL at goal, conts on lipitor

## 2014-02-26 ENCOUNTER — Non-Acute Institutional Stay (SKILLED_NURSING_FACILITY): Payer: Medicaid Other | Admitting: Nurse Practitioner

## 2014-02-26 DIAGNOSIS — I10 Essential (primary) hypertension: Secondary | ICD-10-CM

## 2014-02-26 DIAGNOSIS — G546 Phantom limb syndrome with pain: Secondary | ICD-10-CM

## 2014-02-26 DIAGNOSIS — M25511 Pain in right shoulder: Secondary | ICD-10-CM

## 2014-02-26 DIAGNOSIS — I739 Peripheral vascular disease, unspecified: Secondary | ICD-10-CM

## 2014-02-26 DIAGNOSIS — K5900 Constipation, unspecified: Secondary | ICD-10-CM

## 2014-02-26 DIAGNOSIS — E1165 Type 2 diabetes mellitus with hyperglycemia: Secondary | ICD-10-CM

## 2014-02-26 DIAGNOSIS — I70219 Atherosclerosis of native arteries of extremities with intermittent claudication, unspecified extremity: Secondary | ICD-10-CM

## 2014-02-26 DIAGNOSIS — E1151 Type 2 diabetes mellitus with diabetic peripheral angiopathy without gangrene: Secondary | ICD-10-CM

## 2014-02-26 DIAGNOSIS — E1159 Type 2 diabetes mellitus with other circulatory complications: Secondary | ICD-10-CM

## 2014-02-26 DIAGNOSIS — IMO0002 Reserved for concepts with insufficient information to code with codable children: Secondary | ICD-10-CM

## 2014-02-26 NOTE — Progress Notes (Signed)
Patient ID: Javier Quinn, male   DOB: 1960-10-23, 54 y.o.   MRN: [id number]    Nursing Home Location:  [place] of Service: SNF (31)  PCP: Angelica Chessman, MD  Allergies  Allergen Reactions  . Codeine Other (See Comments)    Causes constipation    Chief Complaint  Patient presents with  . Medical Management of Chronic Issues    HPI:  Patient is a 54 y.o. male seen today at [facility] for routine follow up on chronic conditions. Pt with a pmh of PVD, T2DM, Htn, ASCVD, and Right AKA.  Pt with ongoing ear discomfort, new referral to ENT has been made and appt is tomorrow.  Pts blood pressure is still not at goal but has improved.  cbgs reviewed and fasting ranging from 120-200. Some blood sugars in the 70s midday, pt is skipping breakfast Pt reports he is exercising 45 mins every morning.  Review of Systems:  Review of Systems  Constitutional: Negative for activity change, appetite change, fatigue and unexpected weight change.  HENT: Negative for congestion and hearing loss.   Eyes: Negative.   Respiratory: Negative for cough and shortness of breath.   Cardiovascular: Positive for leg swelling. Negative for chest pain and palpitations.  Gastrointestinal: Positive for constipation (improved on medications). Negative for abdominal pain and diarrhea.  Genitourinary: Negative for dysuria and difficulty urinating.  Musculoskeletal: Negative for myalgias and arthralgias.  Skin: Negative for color change and wound.  Neurological: Negative for dizziness and weakness.       Phantom pain in right AKA and leg pain in left   Psychiatric/Behavioral: Negative for behavioral problems and confusion.    Past Medical History  Diagnosis Date  . Diabetes mellitus without complication   . Hyperlipidemia   . Tobacco dependence   . GERD (gastroesophageal reflux disease)   . H/O hiatal hernia   . Peripheral vascular disease   . Hypertension    See's Dr Johnsie Cancel States   . PVD (peripheral vascular disease) with claudication 05/18/2012  . Atherosclerotic peripheral vascular disease with intermittent claudication 05/18/2012  . Unspecified essential hypertension 05/18/2012  . Type II or unspecified type diabetes mellitus with peripheral circulatory disorders, uncontrolled(250.72) 10/11/2012  . Dyslipidemia 08/10/2012  . Above knee amputation of right lower extremity    Past Surgical History  Procedure Laterality Date  . Bypass graft femoral-peroneal Right 06/15/2012    Procedure: BYPASS GRAFT Campbell Stall;  Surgeon: Conrad [place], MD;  Location: [place];  Service: Vascular;  Laterality: Right;  using right non-reversed saphenous vein  . Lower extremity angiogram Right 06/15/2012    Procedure: LOWER EXTREMITY ANGIOGRAM;  Surgeon: Conrad [place], MD;  Location: [place];  Service: Vascular;  Laterality: Right;  . Lower extremity angiogram Right 09/09/2012    Procedure: LOWER EXTREMITY ANGIOGRAM With Agioplasty Bypass Graft;  Surgeon: Conrad [place], MD;  Location: [place];  Service: Vascular;  Laterality: Right;  . Amputation Right 09/28/2012    Procedure: AMPUTATION ABOVE KNEE;  Surgeon: Conrad [place], MD;  Location: [place];  Service: Vascular;  Laterality: Right;  . Ganglion cyst excision Right 08/16/2013    Procedure: REMOVAL GANGLION cyst  RIGHT WRIST;  Surgeon: Marianna Payment, MD;  Location: [place];  Service: Orthopedics;  Laterality: Right;  . Abdominal aortagram N/A 05/12/2012    Procedure: ABDOMINAL AORTAGRAM;  Surgeon: Conrad [place], MD;  Location: [facility] CATH LAB;  Service: Cardiovascular;  Laterality: N/A;  .  Lower extremity angiogram  05/12/2012    Procedure: LOWER EXTREMITY ANGIOGRAM;  Surgeon: Conrad [place], MD;  Location: [facility] CATH LAB;  Service: Cardiovascular;;   Social History:   reports that he quit smoking about 22 months ago. His smoking use included Cigarettes. He has a 10 pack-year smoking history. He has quit using smokeless tobacco. He reports  that he does not drink alcohol or use illicit drugs.  Family History  Problem Relation Age of Onset  . Cancer Mother     pancreas  . Cancer Father   . Heart attack Sister     Medications: Patient's Medications  New Prescriptions   No medications on file  Previous Medications   ACETAMINOPHEN (TYLENOL) 325 MG TABLET    Take 650 mg by mouth daily as needed for moderate pain.   ALBUTEROL (PROVENTIL HFA;VENTOLIN HFA) 108 (90 BASE) MCG/ACT INHALER    Inhale 2 puffs into the lungs every 4 (four) hours as needed for wheezing.   ATORVASTATIN (LIPITOR) 40 MG TABLET    Take 40 mg by mouth daily.   CLOPIDOGREL (PLAVIX) 75 MG TABLET    Take 1 tablet (75 mg total) by mouth daily with breakfast.   DILTIAZEM (CARDIZEM CD) 120 MG 24 HR CAPSULE    Take 120 mg by mouth daily.   DIVALPROEX (DEPAKOTE) 250 MG DR TABLET    Take 500 mg by mouth at bedtime.    DOCUSATE SODIUM (COLACE) 100 MG CAPSULE    Take 100 mg by mouth 2 (two) times daily.    FUROSEMIDE (LASIX) 20 MG TABLET    Take 20 mg by mouth daily.   GABAPENTIN (NEURONTIN) 300 MG CAPSULE    Take 1 capsule (300 mg total) by mouth 3 (three) times daily.   HYDROCODONE-ACETAMINOPHEN (NORCO) 5-325 MG PER TABLET    Take one to two tablets by mouth every 6 hours as needed for pain   INSULIN GLARGINE (TOUJEO SOLOSTAR) 300 UNIT/ML SOPN    Inject 70 Units into the skin at bedtime.   INSULIN LISPRO (HUMALOG) 100 UNIT/ML INJECTION    Inject 5-10 Units into the skin 3 (three) times daily with meals. If blood sugar is between 150-300 inject 5 units subq. If blood sugar is greater than 300 inject 10 units subq.   LATANOPROST (XALATAN) 0.005 % OPHTHALMIC SOLUTION    Place 1 drop into both eyes at bedtime.   LISINOPRIL (PRINIVIL,ZESTRIL) 40 MG TABLET    Take 40 mg by mouth daily.   METFORMIN (GLUCOPHAGE-XR) 500 MG 24 HR TABLET    Take 1,000 mg by mouth 2 (two) times daily.    SENNA (SENOKOT) 8.6 MG TABS TABLET    Take 1 tablet (8.6 mg total) by mouth daily.   TRAMADOL  (ULTRAM) 50 MG TABLET    Take one tablet by mouth every 6 hours as needed for mild to moderate pain; Take two tablets by mouth every 6 hours as needed for severe pain  Modified Medications   No medications on file  Discontinued Medications   No medications on file     Physical Exam: Filed Vitals:   02/26/14 1120  BP: 155/90  Pulse: 88  Temp: 98.2 F (36.8 C)  Resp: 18  Weight: 234 lb (106.142 kg)    Physical Exam  Constitutional: He is oriented to person, place, and time. He appears well-developed and well-nourished. No distress.  HENT:  Head: Normocephalic and atraumatic.  Mouth/Throat: Oropharynx is clear and moist. No oropharyngeal exudate.  Eyes: Conjunctivae  and EOM are normal. Pupils are equal, round, and reactive to light.  Neck: Normal range of motion. Neck supple.  Cardiovascular: Normal rate, regular rhythm and normal heart sounds.   Pulmonary/Chest: Effort normal and breath sounds normal.  Abdominal: Soft. Bowel sounds are normal.  Obese   Musculoskeletal: He exhibits no tenderness. Edema: +1 on left LE.  Right AKA Small ganglion cyst to right wrist  Neurological: He is alert and oriented to person, place, and time.  Skin: Skin is warm and dry. He is not diaphoretic.  Psychiatric: He has a normal mood and affect.    Labs reviewed: Basic Metabolic Panel:  Recent Labs  05/12/13 1032 08/16/13 0848 11/06/13  NA 138 137 138  K 4.2 4.3 4.2  CL 103 100  --   CO2 26 21  --   GLUCOSE 238* 135*  --   BUN 13 20 9   CREATININE 1.0 0.79 0.8  CALCIUM 9.6 9.6  --    Liver Function Tests: No results for input(s): AST, ALT, ALKPHOS, BILITOT, PROT, ALBUMIN in the last 8760 hours. No results for input(s): LIPASE, AMYLASE in the last 8760 hours. No results for input(s): AMMONIA in the last 8760 hours. CBC:  Recent Labs  08/16/13 0848  WBC 6.2  HGB 14.4  HCT 42.0  MCV 84.3  PLT 245   TSH: No results for input(s): TSH in the last 8760 hours. A1C: Lab  Results  Component Value Date   HGBA1C 8.3* 11/06/2013   Lipid Panel: No results for input(s): CHOL, HDL, LDLCALC, TRIG, CHOLHDL, LDLDIRECT in the last 8760 hours.    Assessment/Plan 1. Type 2 diabetes, uncontrolled, with peripheral circulatory disorder -overall improved blood sugars however not at goal, pt having some low blood sugars before lunch because he is skipping breakfast. Encouraged NOT to skip meals. Small meals at better than skipping. Plus healthy snacks. Works out 45 mins in the am.  -will follow up A1c  2. Essential hypertension -remains frequently elevated, again reinforced complaince with diet. Will increase Cardizem to  240 CD PO daily -also conts on lisinopril and lasix  -will follow up bmp  3. Atherosclerotic peripheral vascular disease with intermittent claudication Stable, conts to follow up with vein and vascular. conts on plavix  4. Constipation, unspecified constipation type -controlled on current regiment   5. Phantom limb pain -stable on gabapentin  6. Right shoulder pain Ongoing follow up with ortho and therapy, no changes in pain.

## 2014-03-06 ENCOUNTER — Non-Acute Institutional Stay (SKILLED_NURSING_FACILITY): Payer: Medicaid Other | Admitting: Internal Medicine

## 2014-03-06 DIAGNOSIS — E1159 Type 2 diabetes mellitus with other circulatory complications: Secondary | ICD-10-CM

## 2014-03-06 DIAGNOSIS — E1165 Type 2 diabetes mellitus with hyperglycemia: Principal | ICD-10-CM

## 2014-03-06 DIAGNOSIS — E1151 Type 2 diabetes mellitus with diabetic peripheral angiopathy without gangrene: Secondary | ICD-10-CM

## 2014-03-06 DIAGNOSIS — IMO0002 Reserved for concepts with insufficient information to code with codable children: Secondary | ICD-10-CM

## 2014-03-08 ENCOUNTER — Other Ambulatory Visit: Payer: Self-pay | Admitting: Orthopaedic Surgery

## 2014-03-08 DIAGNOSIS — M542 Cervicalgia: Secondary | ICD-10-CM

## 2014-03-09 NOTE — Progress Notes (Addendum)
Patient ID: Javier Quinn, male   DOB: [birth year], 54 y.o.   MRN: [id number]               PROGRESS NOTE  DATE:  03/06/2014             FACILITY: [facility]        LEVEL OF CARE:   SNF   Acute Visit   CHIEF COMPLAINT:  Follow up hyperglycemia and other issues.    HISTORY OF PRESENT ILLNESS:  Javier Quinn is a type 2 diabetic with complications, predominantly peripheral vascular disease, atherosclerotic cerebrovascular disease.  He has previously undergone a right AKA.    His recent blood sugars have been in the upper 100 range.  His hemoglobin A1c is 8.5.  Given his age and complications, we would like better control than this.  Currently, he is on Toujeo 78 U as well as metformin 1000 b.i.d.  His last hemoglobin A1c in November 2015 was also 8.3.  Previously in July 2015, this was 9.9.  He does not have evidence of diabetic nephropathy as of yet.  His urine microalbumin level was 0.5, well within the acceptable range although this was not corrected for creatinine.    I sent him to see ENT for a persistent right middle ear effusion.  They are trying to manage this conservatively.    He has also been to see Dr. Erlinda Hong of Orthopedics with regards to pain in his right shoulder.  It is apparently thought that this is radicular from his neck.    Review of his recent CBGs shows yesterday's values to be 153/110/153.  The day before:  153/106/164.  I am going to increase the Toujeo to 85 U at h.s., and monitor this carefully.     ASSESSMENT/PLAN:                         Type 2 diabetes with multiple complications.  Hemoglobin A1c is not acceptable.  The Toujeo will increase to 85 U.    Right middle ear effusion.  Following with ENT.    Apparently radicular pain in his shoulder.  He is going for an MRI of the neck

## 2014-03-19 ENCOUNTER — Other Ambulatory Visit: Payer: Medicaid Other

## 2014-03-23 ENCOUNTER — Ambulatory Visit: Payer: Medicaid Other | Admitting: Family

## 2014-03-23 ENCOUNTER — Encounter (HOSPITAL_COMMUNITY): Payer: Medicaid Other

## 2014-03-26 ENCOUNTER — Ambulatory Visit
Admission: RE | Admit: 2014-03-26 | Discharge: 2014-03-26 | Disposition: A | Discharge status: Home / Self Care | Payer: Medicaid Other | Source: Ambulatory Visit | Attending: Orthopaedic Surgery | Admitting: Orthopaedic Surgery

## 2014-03-26 DIAGNOSIS — M542 Cervicalgia: Secondary | ICD-10-CM

## 2014-04-02 ENCOUNTER — Non-Acute Institutional Stay (SKILLED_NURSING_FACILITY): Payer: Medicaid Other | Admitting: Nurse Practitioner

## 2014-04-02 DIAGNOSIS — E1151 Type 2 diabetes mellitus with diabetic peripheral angiopathy without gangrene: Secondary | ICD-10-CM

## 2014-04-02 DIAGNOSIS — E1159 Type 2 diabetes mellitus with other circulatory complications: Secondary | ICD-10-CM | POA: Diagnosis not present

## 2014-04-02 DIAGNOSIS — I70219 Atherosclerosis of native arteries of extremities with intermittent claudication, unspecified extremity: Secondary | ICD-10-CM

## 2014-04-02 DIAGNOSIS — K5900 Constipation, unspecified: Secondary | ICD-10-CM | POA: Diagnosis not present

## 2014-04-02 DIAGNOSIS — M25511 Pain in right shoulder: Secondary | ICD-10-CM | POA: Diagnosis not present

## 2014-04-02 DIAGNOSIS — I739 Peripheral vascular disease, unspecified: Secondary | ICD-10-CM

## 2014-04-02 DIAGNOSIS — I10 Essential (primary) hypertension: Secondary | ICD-10-CM | POA: Diagnosis not present

## 2014-04-02 DIAGNOSIS — E1165 Type 2 diabetes mellitus with hyperglycemia: Secondary | ICD-10-CM

## 2014-04-02 DIAGNOSIS — IMO0002 Reserved for concepts with insufficient information to code with codable children: Secondary | ICD-10-CM

## 2014-04-02 NOTE — Progress Notes (Signed)
Patient ID: Javier Quinn, male   DOB: 10-12-1960, 54 y.o.   MRN: [id number]    Nursing Home Location:  [place] of Service: SNF (31)  PCP: Angelica Chessman, MD  Allergies  Allergen Reactions  . Codeine Other (See Comments)    Causes constipation    Chief Complaint  Patient presents with  . Medical Management of Chronic Issues    HPI:  Patient is a 54 y.o. male seen today at [facility] for routine follow up on chronic conditions. Pt with a pmh of PVD, T2DM, Htn, ASCVD, and Right AKA.  Pt was seen by Dr Dellia Nims in the last month who increased his toujeo. Pt with ongoing follow up with ENT due to fullness in ears. Pt reports he had tubes placed last week- does not see improvement yet.  Reports he conts to lift weights, attempts to do physical activity daily. Reports he has made changes to diet, however A1c is worse despite increase in insulin.  No new complaints at this time.  Review of Systems:  Review of Systems  Constitutional: Negative for activity change, appetite change, fatigue and unexpected weight change.  HENT: Negative for congestion and hearing loss.   Eyes: Negative.   Respiratory: Negative for cough and shortness of breath.   Cardiovascular: Positive for leg swelling. Negative for chest pain and palpitations.  Gastrointestinal: Positive for constipation (improved on medications). Negative for abdominal pain and diarrhea.  Genitourinary: Negative for dysuria and difficulty urinating.  Musculoskeletal: Negative for myalgias and arthralgias.  Skin: Negative for color change and wound.  Neurological: Negative for dizziness and weakness.       Phantom pain in right AKA and leg pain in left   Psychiatric/Behavioral: Negative for behavioral problems and confusion.    Past Medical History  Diagnosis Date  . Diabetes mellitus without complication   . Hyperlipidemia   . Tobacco dependence   . GERD (gastroesophageal reflux  disease)   . H/O hiatal hernia   . Peripheral vascular disease   . Hypertension     See's Dr Johnsie Cancel States   . PVD (peripheral vascular disease) with claudication 05/18/2012  . Atherosclerotic peripheral vascular disease with intermittent claudication 05/18/2012  . Unspecified essential hypertension 05/18/2012  . Type II or unspecified type diabetes mellitus with peripheral circulatory disorders, uncontrolled(250.72) 10/11/2012  . Dyslipidemia 08/10/2012  . Above knee amputation of right lower extremity    Past Surgical History  Procedure Laterality Date  . Bypass graft femoral-peroneal Right 06/15/2012    Procedure: BYPASS GRAFT Campbell Stall;  Surgeon: Conrad [place], MD;  Location: [place];  Service: Vascular;  Laterality: Right;  using right non-reversed saphenous vein  . Lower extremity angiogram Right 06/15/2012    Procedure: LOWER EXTREMITY ANGIOGRAM;  Surgeon: Conrad [place], MD;  Location: [place];  Service: Vascular;  Laterality: Right;  . Lower extremity angiogram Right 09/09/2012    Procedure: LOWER EXTREMITY ANGIOGRAM With Agioplasty Bypass Graft;  Surgeon: Conrad [place], MD;  Location: [place];  Service: Vascular;  Laterality: Right;  . Amputation Right 09/28/2012    Procedure: AMPUTATION ABOVE KNEE;  Surgeon: Conrad , MD;  Location: [place];  Service: Vascular;  Laterality: Right;  . Ganglion cyst excision Right 08/16/2013    Procedure: REMOVAL GANGLION cyst  RIGHT WRIST;  Surgeon: Marianna Payment, MD;  Location: [place];  Service: Orthopedics;  Laterality: Right;  . Abdominal aortagram N/A 05/12/2012    Procedure: ABDOMINAL  Maxcine Ham;  Surgeon: Conrad [place], MD;  Location: [facility] CATH LAB;  Service: Cardiovascular;  Laterality: N/A;  . Lower extremity angiogram  05/12/2012    Procedure: LOWER EXTREMITY ANGIOGRAM;  Surgeon: Conrad Scofield, MD;  Location: [facility] CATH LAB;  Service: Cardiovascular;;   Social History:   reports that he quit smoking about 1 years ago. His smoking use included Cigarettes.  He has a 10 pack-year smoking history. He has quit using smokeless tobacco. He reports that he does not drink alcohol or use illicit drugs.  Family History  Problem Relation Age of Onset  . Cancer Mother     pancreas  . Cancer Father   . Heart attack Sister     Medications: Patient's Medications  New Prescriptions   No medications on file  Previous Medications   ACETAMINOPHEN (TYLENOL) 325 MG TABLET    Take 650 mg by mouth daily as needed for moderate pain.   ALBUTEROL (PROVENTIL HFA;VENTOLIN HFA) 108 (90 BASE) MCG/ACT INHALER    Inhale 2 puffs into the lungs every 4 (four) hours as needed for wheezing.   ATORVASTATIN (LIPITOR) 40 MG TABLET    Take 40 mg by mouth daily.   CLOPIDOGREL (PLAVIX) 75 MG TABLET    Take 1 tablet (75 mg total) by mouth daily with breakfast.   DILTIAZEM (CARDIZEM CD) 120 MG 24 HR CAPSULE    Take 120 mg by mouth daily.   DIVALPROEX (DEPAKOTE) 250 MG DR TABLET    Take 500 mg by mouth at bedtime.    DOCUSATE SODIUM (COLACE) 100 MG CAPSULE    Take 100 mg by mouth 2 (two) times daily.    FUROSEMIDE (LASIX) 20 MG TABLET    Take 20 mg by mouth daily.   GABAPENTIN (NEURONTIN) 300 MG CAPSULE    Take 1 capsule (300 mg total) by mouth 3 (three) times daily.   HYDROCODONE-ACETAMINOPHEN (NORCO) 5-325 MG PER TABLET    Take one to two tablets by mouth every 6 hours as needed for pain   INSULIN GLARGINE (TOUJEO SOLOSTAR) 300 UNIT/ML SOPN    Inject 85 Units into the skin at bedtime.    INSULIN LISPRO (HUMALOG) 100 UNIT/ML INJECTION    Inject 5-10 Units into the skin 3 (three) times daily with meals. If blood sugar is between 150-300 inject 5 units subq. If blood sugar is greater than 300 inject 10 units subq.   LATANOPROST (XALATAN) 0.005 % OPHTHALMIC SOLUTION    Place 1 drop into both eyes at bedtime.   LISINOPRIL (PRINIVIL,ZESTRIL) 40 MG TABLET    Take 40 mg by mouth daily.   METFORMIN (GLUCOPHAGE-XR) 500 MG 24 HR TABLET    Take 1,000 mg by mouth 2 (two) times daily.    SENNA  (SENOKOT) 8.6 MG TABS TABLET    Take 1 tablet (8.6 mg total) by mouth daily.   TRAMADOL (ULTRAM) 50 MG TABLET    Take one tablet by mouth every 6 hours as needed for mild to moderate pain; Take two tablets by mouth every 6 hours as needed for severe pain  Modified Medications   No medications on file  Discontinued Medications   No medications on file     Physical Exam: Filed Vitals:   04/02/14 1033  BP: 138/84  Pulse: 80  Temp: 97.4 F (36.3 C)  Resp: 18  Weight: 234 lb (106.142 kg)    Physical Exam  Constitutional: He is oriented to person, place, and time. He appears well-developed and well-nourished. No  distress.  HENT:  Head: Normocephalic and atraumatic.  Mouth/Throat: Oropharynx is clear and moist. No oropharyngeal exudate.  Eyes: Conjunctivae and EOM are normal. Pupils are equal, round, and reactive to light.  Neck: Normal range of motion. Neck supple.  Cardiovascular: Normal rate, regular rhythm and normal heart sounds.   Pulmonary/Chest: Effort normal and breath sounds normal.  Abdominal: Soft. Bowel sounds are normal.  Obese   Musculoskeletal: He exhibits no tenderness. Edema: +1 on left LE.  Right AKA Small ganglion cyst to right wrist  Neurological: He is alert and oriented to person, place, and time.  Skin: Skin is warm and dry. He is not diaphoretic.  Psychiatric: He has a normal mood and affect.    Labs reviewed: Basic Metabolic Panel:  Recent Labs  05/12/13 1032 08/16/13 0848 11/06/13  NA 138 137 138  K 4.2 4.3 4.2  CL 103 100  --   CO2 26 21  --   GLUCOSE 238* 135*  --   BUN 13 20 9   CREATININE 1.0 0.79 0.8  CALCIUM 9.6 9.6  --    Liver Function Tests: No results for input(s): AST, ALT, ALKPHOS, BILITOT, PROT, ALBUMIN in the last 8760 hours. No results for input(s): LIPASE, AMYLASE in the last 8760 hours. No results for input(s): AMMONIA in the last 8760 hours. CBC:  Recent Labs  08/16/13 0848  WBC 6.2  HGB 14.4  HCT 42.0  MCV 84.3    PLT 245   TSH: No results for input(s): TSH in the last 8760 hours. A1C: Lab Results  Component Value Date   HGBA1C 8.3* 11/06/2013   Lipid Panel: No results for input(s): CHOL, HDL, LDLCALC, TRIG, CHOLHDL, LDLDIRECT in the last 8760 hours.   CBC NO Diff (Complete Blood Count)    Result: 02/28/2014 12:52 PM   ( Status: F )       WBC 7.0     4.0-10.5 K/uL SLN   RBC 4.93     4.22-5.81 MIL/uL SLN   Hemoglobin 14.1     13.0-17.0 g/dL SLN   Hematocrit 39.8     39.0-52.0 % SLN   MCV 80.7     78.0-100.0 fL SLN   MCH 28.6     26.0-34.0 pg SLN   MCHC 35.4     30.0-36.0 g/dL SLN   RDW 14.4     11.5-15.5 % SLN   Platelet Count 246     150-400 K/uL SLN   MPV 8.4   L 8.6-12.4 fL SLN   Basic Metabolic Panel    Result: 02/28/2014 2:25 PM   ( Status: F )       Sodium 143     135-145 mEq/L SLN   Potassium 4.0     3.5-5.3 mEq/L SLN   Chloride 106     96-112 mEq/L SLN   CO2 25     19-32 mEq/L SLN   Glucose 179   H 70-99 mg/dL SLN   BUN 10     6-23 mg/dL SLN   Creatinine 0.71     0.50-1.35 mg/dL SLN   Calcium 9.0     8.4-10.5 mg/dL SLN   Hemoglobin A1c with eAG    Result: 02/28/2014 4:35 PM   ( Status: F )       Hemoglobin A1C 8.5   H <5.7 % SLN C Estimated Average Glucose 197   H <117 mg/dL SLN   Valproic Acid (Depakene)    Result: 02/28/2014 1:57 PM   (  Status: F )       Valproic Acid (Depakene) 22.2   L 50.0-100.0 ug/mL SLN Assessment/Plan 1. Atherosclerotic peripheral vascular disease with intermittent claudication Ongoing follow up with vascular, conts on plavix. Working towards proper A1c. blood pressure controlled which has overall improved in the last few months.   2. Type 2 diabetes, uncontrolled, with peripheral circulatory disorder A1C was worse on last lab in February at 8.5, Dr Dellia Nims saw pt and increased Toujeo to 85 units, fasting ranging from 120-200, overall blood sugars below 260. -will again increase Toujeo to 88 units qhs for better control -cont dietary and exercise  modifications for optimal treatment  3. Essential hypertension Overall has improved blood pressure ranging from 138-150/80s, will increase cardizem CD to 240 mg for optimal treatment.  Encouraged low sodium diet as well.   4. Constipation, unspecified constipation type Controlled at this time  5. Right shoulder pain Following with ortho, has had MRI and to follow up with orthopedic to discuss further treatment

## 2014-04-26 ENCOUNTER — Other Ambulatory Visit: Payer: Self-pay | Admitting: *Deleted

## 2014-04-26 MED ORDER — HYDROCODONE-ACETAMINOPHEN 5-325 MG PO TABS: ORAL_TABLET | ORAL | Status: DC

## 2014-04-26 NOTE — Telephone Encounter (Signed)
[facility] 

## 2014-05-14 ENCOUNTER — Non-Acute Institutional Stay (SKILLED_NURSING_FACILITY): Payer: Medicaid Other | Admitting: Nurse Practitioner

## 2014-05-14 DIAGNOSIS — I70219 Atherosclerosis of native arteries of extremities with intermittent claudication, unspecified extremity: Secondary | ICD-10-CM

## 2014-05-14 DIAGNOSIS — IMO0002 Reserved for concepts with insufficient information to code with codable children: Secondary | ICD-10-CM

## 2014-05-14 DIAGNOSIS — I739 Peripheral vascular disease, unspecified: Secondary | ICD-10-CM

## 2014-05-14 DIAGNOSIS — E1151 Type 2 diabetes mellitus with diabetic peripheral angiopathy without gangrene: Secondary | ICD-10-CM

## 2014-05-14 DIAGNOSIS — E1165 Type 2 diabetes mellitus with hyperglycemia: Secondary | ICD-10-CM

## 2014-05-14 DIAGNOSIS — G546 Phantom limb syndrome with pain: Secondary | ICD-10-CM | POA: Diagnosis not present

## 2014-05-14 DIAGNOSIS — E1159 Type 2 diabetes mellitus with other circulatory complications: Secondary | ICD-10-CM

## 2014-05-14 DIAGNOSIS — M25511 Pain in right shoulder: Secondary | ICD-10-CM | POA: Diagnosis not present

## 2014-05-14 DIAGNOSIS — I10 Essential (primary) hypertension: Secondary | ICD-10-CM | POA: Diagnosis not present

## 2014-05-14 NOTE — Progress Notes (Signed)
Patient ID: Javier Quinn, male   DOB: 1960-03-24, 54 y.o.   MRN: [id number]    Nursing Home Location:  [place] of Service: SNF (31)  PCP: Angelica Chessman, MD  Allergies  Allergen Reactions  . Codeine Other (See Comments)    Causes constipation    Chief Complaint  Patient presents with  . Medical Management of Chronic Issues    HPI:  Patient is a 54 y.o. male seen today at [facility] for routine follow up on chronic conditions. Pt with a pmh of PVD, T2DM, Htn, ASCVD, and Right AKA.  Pt recently with facet injection due to worsening neck and shoulder pain, has helped some but pain is still there. He is unable to lift weights due to the shoulder pain but has made dietary adjustments and does not eat a lot of sodium rich foods.   Review of Systems:  Review of Systems  Constitutional: Negative for activity change, appetite change, fatigue and unexpected weight change.  HENT: Negative for congestion and hearing loss.   Eyes: Negative.   Respiratory: Negative for cough and shortness of breath.   Cardiovascular: Positive for leg swelling (elevation helps swelling). Negative for chest pain and palpitations.  Gastrointestinal: Positive for constipation (improved on medications). Negative for abdominal pain and diarrhea.  Genitourinary: Negative for dysuria and difficulty urinating.  Musculoskeletal: Positive for myalgias and arthralgias.       Right shoulder pain  Skin: Negative for color change and wound.  Neurological: Negative for dizziness and weakness.       Phantom pain in right AKA and leg pain in left   Psychiatric/Behavioral: Negative for behavioral problems and confusion.    Past Medical History  Diagnosis Date  . Diabetes mellitus without complication   . Hyperlipidemia   . Tobacco dependence   . GERD (gastroesophageal reflux disease)   . H/O hiatal hernia   . Peripheral vascular disease   . Hypertension     See's Dr  Johnsie Cancel States   . PVD (peripheral vascular disease) with claudication 05/18/2012  . Atherosclerotic peripheral vascular disease with intermittent claudication 05/18/2012  . Unspecified essential hypertension 05/18/2012  . Type II or unspecified type diabetes mellitus with peripheral circulatory disorders, uncontrolled(250.72) 10/11/2012  . Dyslipidemia 08/10/2012  . Above knee amputation of right lower extremity    Past Surgical History  Procedure Laterality Date  . Bypass graft femoral-peroneal Right 06/15/2012    Procedure: BYPASS GRAFT Campbell Stall;  Surgeon: Conrad Benavides, MD;  Location: [place];  Service: Vascular;  Laterality: Right;  using right non-reversed saphenous vein  . Lower extremity angiogram Right 06/15/2012    Procedure: LOWER EXTREMITY ANGIOGRAM;  Surgeon: Conrad [place], MD;  Location: [place];  Service: Vascular;  Laterality: Right;  . Lower extremity angiogram Right 09/09/2012    Procedure: LOWER EXTREMITY ANGIOGRAM With Agioplasty Bypass Graft;  Surgeon: Conrad [place], MD;  Location: [place];  Service: Vascular;  Laterality: Right;  . Amputation Right 09/28/2012    Procedure: AMPUTATION ABOVE KNEE;  Surgeon: Conrad [place], MD;  Location: [place];  Service: Vascular;  Laterality: Right;  . Ganglion cyst excision Right 08/16/2013    Procedure: REMOVAL GANGLION cyst  RIGHT WRIST;  Surgeon: Marianna Payment, MD;  Location: [place];  Service: Orthopedics;  Laterality: Right;  . Abdominal aortagram N/A 05/12/2012    Procedure: ABDOMINAL AORTAGRAM;  Surgeon: Conrad , MD;  Location[facility] CATH LAB;  Service: Cardiovascular;  Laterality: N/A;  . Lower extremity angiogram  05/12/2012    Procedure: LOWER EXTREMITY ANGIOGRAM;  Surgeon: Conrad [place], MD;  Location: [facility] CATH LAB;  Service: Cardiovascular;;   Social History:   reports that he quit smoking about 2 years ago. His smoking use included Cigarettes. He has a 10 pack-year smoking history. He has quit using smokeless tobacco. He reports that he  does not drink alcohol or use illicit drugs.  Family History  Problem Relation Age of Onset  . Cancer Mother     pancreas  . Cancer Father   . Heart attack Sister     Medications: Patient's Medications  New Prescriptions   No medications on file  Previous Medications   ACETAMINOPHEN (TYLENOL) 325 MG TABLET    Take 650 mg by mouth daily as needed for moderate pain.   ALBUTEROL (PROVENTIL HFA;VENTOLIN HFA) 108 (90 BASE) MCG/ACT INHALER    Inhale 2 puffs into the lungs every 4 (four) hours as needed for wheezing.   ATORVASTATIN (LIPITOR) 40 MG TABLET    Take 40 mg by mouth daily.   CLOPIDOGREL (PLAVIX) 75 MG TABLET    Take 1 tablet (75 mg total) by mouth daily with breakfast.   DILTIAZEM (CARDIZEM CD) 120 MG 24 HR CAPSULE    Take 240 mg by mouth daily.   DIVALPROEX (DEPAKOTE) 250 MG DR TABLET    Take 500 mg by mouth at bedtime.    DOCUSATE SODIUM (COLACE) 100 MG CAPSULE    Take 100 mg by mouth 2 (two) times daily.    FUROSEMIDE (LASIX) 20 MG TABLET    Take 20 mg by mouth daily.   GABAPENTIN (NEURONTIN) 300 MG CAPSULE    Take 1 capsule (300 mg total) by mouth 3 (three) times daily.   HYDROCODONE-ACETAMINOPHEN (NORCO) 5-325 MG PER TABLET    Take one tablet by mouth every 6 hours as needed for mild to moderate pain.   INSULIN GLARGINE (TOUJEO SOLOSTAR) 300 UNIT/ML SOPN    Inject 88 Units into the skin at bedtime.    INSULIN LISPRO (HUMALOG) 100 UNIT/ML INJECTION    Inject 5-10 Units into the skin 3 (three) times daily with meals. If blood sugar is between 150-300 inject 5 units subq. If blood sugar is greater than 300 inject 10 units subq.   LATANOPROST (XALATAN) 0.005 % OPHTHALMIC SOLUTION    Place 1 drop into both eyes at bedtime.   LISINOPRIL (PRINIVIL,ZESTRIL) 40 MG TABLET    Take 40 mg by mouth daily.   METFORMIN (GLUCOPHAGE-XR) 500 MG 24 HR TABLET    Take 1,000 mg by mouth 2 (two) times daily.    SENNA (SENOKOT) 8.6 MG TABS TABLET    Take 1 tablet (8.6 mg total) by mouth daily.    TRAMADOL (ULTRAM) 50 MG TABLET    Take one tablet by mouth every 6 hours as needed for mild to moderate pain; Take two tablets by mouth every 6 hours as needed for severe pain  Modified Medications   No medications on file  Discontinued Medications   No medications on file     Physical Exam: Filed Vitals:   05/14/14 1037  BP: 138/74  Pulse: 78  Temp: 98.5 F (36.9 C)  Resp: 20  Weight: 236 lb (107.049 kg)    Physical Exam  Constitutional: He is oriented to person, place, and time. He appears well-developed and well-nourished. No distress.  HENT:  Head: Normocephalic and atraumatic.  Mouth/Throat: Oropharynx is clear and moist.  No oropharyngeal exudate.  Eyes: Conjunctivae and EOM are normal. Pupils are equal, round, and reactive to light.  Neck: Normal range of motion. Neck supple.  Cardiovascular: Normal rate, regular rhythm and normal heart sounds.   Pulmonary/Chest: Effort normal and breath sounds normal.  Abdominal: Soft. Bowel sounds are normal.  Obese   Musculoskeletal: He exhibits no tenderness. Edema: +1 on left LE.  Right AKA Small ganglion cyst to right wrist  Neurological: He is alert and oriented to person, place, and time.  Skin: Skin is warm and dry. He is not diaphoretic.  Psychiatric: He has a normal mood and affect.    Labs reviewed: Basic Metabolic Panel:  Recent Labs  08/16/13 0848 11/06/13  NA 137 138  K 4.3 4.2  CL 100  --   CO2 21  --   GLUCOSE 135*  --   BUN 20 9  CREATININE 0.79 0.8  CALCIUM 9.6  --    Liver Function Tests: No results for input(s): AST, ALT, ALKPHOS, BILITOT, PROT, ALBUMIN in the last 8760 hours. No results for input(s): LIPASE, AMYLASE in the last 8760 hours. No results for input(s): AMMONIA in the last 8760 hours. CBC:  Recent Labs  08/16/13 0848  WBC 6.2  HGB 14.4  HCT 42.0  MCV 84.3  PLT 245   TSH: No results for input(s): TSH in the last 8760 hours. A1C: Lab Results  Component Value Date   HGBA1C  8.3* 11/06/2013   Lipid Panel: No results for input(s): CHOL, HDL, LDLCALC, TRIG, CHOLHDL, LDLDIRECT in the last 8760 hours.   CBC NO Diff (Complete Blood Count)    Result: 02/28/2014 12:52 PM   ( Status: F )       WBC 7.0     4.0-10.5 K/uL SLN   RBC 4.93     4.22-5.81 MIL/uL SLN   Hemoglobin 14.1     13.0-17.0 g/dL SLN   Hematocrit 39.8     39.0-52.0 % SLN   MCV 80.7     78.0-100.0 fL SLN   MCH 28.6     26.0-34.0 pg SLN   MCHC 35.4     30.0-36.0 g/dL SLN   RDW 14.4     11.5-15.5 % SLN   Platelet Count 246     150-400 K/uL SLN   MPV 8.4   L 8.6-12.4 fL SLN   Basic Metabolic Panel    Result: 02/28/2014 2:25 PM   ( Status: F )       Sodium 143     135-145 mEq/L SLN   Potassium 4.0     3.5-5.3 mEq/L SLN   Chloride 106     96-112 mEq/L SLN   CO2 25     19-32 mEq/L SLN   Glucose 179   H 70-99 mg/dL SLN   BUN 10     6-23 mg/dL SLN   Creatinine 0.71     0.50-1.35 mg/dL SLN   Calcium 9.0     8.4-10.5 mg/dL SLN   Hemoglobin A1c with eAG    Result: 02/28/2014 4:35 PM   ( Status: F )       Hemoglobin A1C 8.5   H <5.7 % SLN C Estimated Average Glucose 197   H <117 mg/dL SLN   Valproic Acid (Depakene)    Result: 02/28/2014 1:57 PM   ( Status: F )       Valproic Acid (Depakene) 22.2   L 50.0-100.0 ug/mL SLN Assessment/Plan 1. Atherosclerotic peripheral vascular disease with  intermittent claudication follow up with vascular, conts on plavix. Working on proper A1c, blood pressure and cholesterol control.   2. Type 2 diabetes, uncontrolled, with peripheral circulatory disorder  Toujeo at 88  units, fasting ranging from 82-150,, overall blood sugars below 230 at this time, no hypoglycemic episodes.  -will cont current insulin and dietary and exercise modifications at this time  3. Essential hypertension Blood pressures have improved, will cont current medications and strict dietary modifications   4. Right shoulder pain Following with ortho, has had MRI which revelaed cervical facet  spondylosis at C4 -C6, injection done which provided some relief but still having pain, ongoing follow up with ortho.   5. Phantom limb pain Worsening pain at times based on movement and how he rest, does not keep him up at night. conts on gabapentin.

## 2014-05-28 ENCOUNTER — Other Ambulatory Visit: Payer: Self-pay | Admitting: *Deleted

## 2014-05-28 MED ORDER — HYDROCODONE-ACETAMINOPHEN 5-325 MG PO TABS: ORAL_TABLET | ORAL | Status: DC

## 2014-05-28 NOTE — Telephone Encounter (Signed)
[facility] 

## 2014-06-19 ENCOUNTER — Encounter: Payer: Self-pay | Admitting: Family

## 2014-06-19 ENCOUNTER — Non-Acute Institutional Stay (SKILLED_NURSING_FACILITY): Payer: Medicaid Other | Admitting: Internal Medicine

## 2014-06-19 DIAGNOSIS — E1151 Type 2 diabetes mellitus with diabetic peripheral angiopathy without gangrene: Secondary | ICD-10-CM

## 2014-06-19 DIAGNOSIS — E1159 Type 2 diabetes mellitus with other circulatory complications: Secondary | ICD-10-CM | POA: Diagnosis not present

## 2014-06-19 DIAGNOSIS — M25512 Pain in left shoulder: Secondary | ICD-10-CM

## 2014-06-19 DIAGNOSIS — E1165 Type 2 diabetes mellitus with hyperglycemia: Principal | ICD-10-CM

## 2014-06-19 DIAGNOSIS — IMO0002 Reserved for concepts with insufficient information to code with codable children: Secondary | ICD-10-CM

## 2014-06-21 NOTE — Progress Notes (Addendum)
Patient ID: Javier Quinn, male   DOB: July 28, 1960, 54 y.o.   MRN: [id number]                PROGRESS NOTE  DATE:  06/19/2014             FACILITY: [facility]                          LEVEL OF CARE:   SNF   Acute Visit                              CHIEF COMPLAINT:  Follow up hyperglycemia and other issues.     HISTORY OF PRESENT ILLNESS:  [name] hemoglobin A1c from earlier this month was 8.7.  This is even higher than the 8.5 in February.   However, it is down from 9.9 in July.    He currently is on Toujeo insulin at 88 U daily.  He is on metformin 1000 b.i.d. and he is on a loose sliding scale of 5 U between 150 and 300 and 10 U above that.  He is almost never above 300, so he does not get the 10 U.  In the mornings, he is in the low 100s.  A.c. lunch is acceptable.  However, later in the day he is almost always consistently above 200.  I think that is where the problem lies.    With regards to his other current medical issues, he is mostly complaining about pain in his right shoulder.  He has been to see Dr. Louanne Skye of Orthopedics.  This is felt to be radicular.  He had an MRI of his neck in March.  This showed significant bilateral foraminal stenosis at C4-C5 and severe on the left at C5-C6.  I think there is additional imaging that was asked for by Dr. Louanne Skye, which Medicaid has denied.  I am not really sure of the issues here.    LABORATORY DATA:  Lab work from 06/08/2014:   Hemoglobin A1c 8.7.    From 02/28/2014:  BUN 10, creatinine 0.71.    Hemoglobin 8.5.  Normal CBC.    From 12/27/2013:  LDL cholesterol 83.    REVIEW OF SYSTEMS:    GENERAL:  He states he has gained weight.   CHEST/RESPIRATORY:  No shortness of breath.   CARDIAC:  No chest pain.   VASCULAR:   In his remaining left leg, he has known significant PAD for which he has seen Dr. Bridgett Larsson, although he does not appear to have claudication currently.    PHYSICAL EXAMINATION:   GENERAL APPEARANCE:  The patient is  not in any distress.   VASCULAR:   ARTERIAL:  Extremities:  He does have very poor perfusion in the left leg.  There is no palpable pulse in the dorsalis pedis or below.   CHEST/RESPIRATORY:  Clear air entry bilaterally.    CARDIOVASCULAR:   CARDIAC:  Heart sounds are normal.    ASSESSMENT/PLAN:                     Type 2 diabetes with PAD.  I think the issue here is later in the day, i.e. a.c. supper hyperglycemia.  I am going to take him off his sliding scale and move to a routine short-acting insulin.  I think the Toujeo at 38 U is sufficient and the metformin is probably adequate.  Radicular pain in his shoulder from significant cervical spinal stenosis, especially at C4-C5.  I am not particularly sure what Dr. Louanne Skye is planning here.  The patient showed me his denial letters from the intermediary for Medicaid.

## 2014-06-22 ENCOUNTER — Ambulatory Visit (INDEPENDENT_AMBULATORY_CARE_PROVIDER_SITE_OTHER): Payer: Medicaid Other | Admitting: Family

## 2014-06-22 ENCOUNTER — Encounter (HOSPITAL_COMMUNITY): Payer: Medicaid Other

## 2014-06-22 ENCOUNTER — Encounter: Payer: Self-pay | Admitting: Family

## 2014-06-22 VITALS — BP 127/83 | HR 73 | Temp 97.6°F | Resp 16 | Ht 72.0 in | Wt 238.0 lb

## 2014-06-22 DIAGNOSIS — Z89611 Acquired absence of right leg above knee: Secondary | ICD-10-CM | POA: Diagnosis not present

## 2014-06-22 DIAGNOSIS — I739 Peripheral vascular disease, unspecified: Secondary | ICD-10-CM

## 2014-06-22 DIAGNOSIS — E1159 Type 2 diabetes mellitus with other circulatory complications: Secondary | ICD-10-CM | POA: Diagnosis not present

## 2014-06-22 DIAGNOSIS — E1151 Type 2 diabetes mellitus with diabetic peripheral angiopathy without gangrene: Secondary | ICD-10-CM

## 2014-06-22 DIAGNOSIS — E1142 Type 2 diabetes mellitus with diabetic polyneuropathy: Secondary | ICD-10-CM | POA: Diagnosis not present

## 2014-06-22 DIAGNOSIS — G629 Polyneuropathy, unspecified: Secondary | ICD-10-CM

## 2014-06-22 DIAGNOSIS — M7989 Other specified soft tissue disorders: Secondary | ICD-10-CM | POA: Insufficient documentation

## 2014-06-22 DIAGNOSIS — R200 Anesthesia of skin: Secondary | ICD-10-CM | POA: Insufficient documentation

## 2014-06-22 NOTE — Patient Instructions (Signed)

## 2014-06-22 NOTE — Progress Notes (Signed)
[facility] [place] HISTORY AND PHYSICAL -PAD  History of Present Illness Javier Quinn is a 54 y.o. male  patient of Dr. Bridgett Larsson who presents with chief complaint: follow up for PAD. Previous operation(s) completed include: failed R SFA to peroneal bypass, PTA distal anastomosis, R AKA.   The patient's symptoms are: neuropathic pain in L foot and diffuse numbness if L foot.   The patient's treatment regimen currently included: maximal medical management, physical therapy has ended.  [name] He continues to have swelling in his left foot/lower leg; swelling resolves overnight with left leg elevation.  The pt denies non healing wounds on his right AKA stump or left lower extremity.  He states his DM is under fair control, A1C November 2015 was 8.3, uncontrolled. He states his insulin was increased lately. He takes Plavix and Lipitor, no ASA, no anticoagulants.  He denies any history of stroke or TIA. At his last visit on 12/22/13 I advised 3 months follow up with LLE  ABI. ABI's not done today, unclear as to why.  He states his sister emptied his bank account and he has no apt to go to Pt states he is waiting on an apt to become available.   Pt states that his orthopedic doctor determined that his right shoulder to right leg pain is likely from a pinched nerve in his back, that he needs an MRI of neck and back but that his insurance denied coverage.  He walks daily with his prosthetic that rubs his right hip, he also must use a walker. Pt states he needs to contact Fritz Pickerel at [facility] for further adjustments of his right AKA prosthetic.   Pt smoker: former smoker, quit in 2014  Pt meds include: Statin :Yes Betablocker: No ASA: No Other anticoagulants/antiplatelets: Plavix   Past Medical History  Diagnosis Date  . Diabetes mellitus without complication   . Hyperlipidemia   . Tobacco dependence   . GERD (gastroesophageal reflux disease)   . H/O hiatal hernia   .  Peripheral vascular disease   . Hypertension     See's Dr Johnsie Cancel States   . PVD (peripheral vascular disease) with claudication 05/18/2012  . Atherosclerotic peripheral vascular disease with intermittent claudication 05/18/2012  . Unspecified essential hypertension 05/18/2012  . Type II or unspecified type diabetes mellitus with peripheral circulatory disorders, uncontrolled(250.72) 10/11/2012  . Dyslipidemia 08/10/2012  . Above knee amputation of right lower extremity     Social History History  Substance Use Topics  . Smoking status: Former Smoker -- 0.50 packs/day for 20 years    Types: Cigarettes    Quit date: 04/11/2012  . Smokeless tobacco: Former Systems developer  . Alcohol Use: No     Comment: occ    Family History Family History  Problem Relation Age of Onset  . Cancer Mother     pancreas  . Cancer Father   . Heart attack Sister     Past Surgical History  Procedure Laterality Date  . Bypass graft femoral-peroneal Right 06/15/2012    Procedure: BYPASS GRAFT Campbell Stall;  Surgeon: Conrad Pekin, MD;  Location: [place];  Service: Vascular;  Laterality: Right;  using right non-reversed saphenous vein  . Lower extremity angiogram Right 06/15/2012    Procedure: LOWER EXTREMITY ANGIOGRAM;  Surgeon: Conrad [place], MD;  Location: [place];  Service: Vascular;  Laterality: Right;  . Lower extremity angiogram Right 09/09/2012    Procedure: LOWER EXTREMITY ANGIOGRAM With Agioplasty Bypass Graft;  Surgeon: Jannette Fogo  Bridgett Larsson, MD;  Location: [place];  Service: Vascular;  Laterality: Right;  . Amputation Right 09/28/2012    Procedure: AMPUTATION ABOVE KNEE;  Surgeon: Conrad Nobles, MD;  Location: [place];  Service: Vascular;  Laterality: Right;  . Ganglion cyst excision Right 08/16/2013    Procedure: REMOVAL GANGLION cyst  RIGHT WRIST;  Surgeon: Marianna Payment, MD;  Location: [place];  Service: Orthopedics;  Laterality: Right;  . Abdominal aortagram N/A 05/12/2012    Procedure: ABDOMINAL AORTAGRAM;  Surgeon: Conrad Beltrami, MD;  [facility] C[facility] LAB;  Service: Cardiovascular;  Laterality: N/A;  . Lower extremity angiogram  05/12/2012    Procedure: LOWER EXTREMITY ANGIOGRAM;  Surgeon: Conrad Sunbury, MD;  Location: [facility] CATH LAB;  Service: Cardiovascular;;    Allergies  Allergen Reactions  . Codeine Other (See Comments)    Causes constipation    Current Outpatient Prescriptions  Medication Sig Dispense Refill  . acetaminophen (TYLENOL) 325 MG tablet Take 650 mg by mouth daily as needed for moderate pain.    [name] albuterol (PROVENTIL HFA;VENTOLIN HFA) 108 (90 BASE) MCG/ACT inhaler Inhale 2 puffs into the lungs every 4 (four) hours as needed for wheezing. 1 each 0  . atorvastatin (LIPITOR) 40 MG tablet Take 40 mg by mouth daily.    . clopidogrel (PLAVIX) 75 MG tablet Take 1 tablet (75 mg total) by mouth daily with breakfast. 30 tablet 6  . diltiazem (CARDIZEM CD) 120 MG 24 hr capsule Take 240 mg by mouth daily.    . divalproex (DEPAKOTE) 250 MG DR tablet Take 500 mg by mouth at bedtime.     . docusate sodium (COLACE) 100 MG capsule Take 100 mg by mouth 2 (two) times daily.     . furosemide (LASIX) 20 MG tablet Take 20 mg by mouth daily.    [name] gabapentin (NEURONTIN) 300 MG capsule Take 1 capsule (300 mg total) by mouth 3 (three) times daily. 90 capsule 11  . HYDROcodone-acetaminophen (NORCO) 5-325 MG per tablet Take one tablet by mouth every 6 hours as needed for mild to moderate pain. Do not exceed 4gm of APAP in 24 hours 120 tablet 0  . Insulin Glargine (TOUJEO SOLOSTAR) 300 UNIT/ML SOPN Inject 88 Units into the skin at bedtime.     . insulin lispro (HUMALOG) 100 UNIT/ML injection Inject 5-10 Units into the skin 3 (three) times daily with meals. If blood sugar is between 150-300 inject 5 units subq. If blood sugar is greater than 300 inject 10 units subq.    . latanoprost (XALATAN) 0.005 % ophthalmic solution Place 1 drop into both eyes at bedtime.    [name] lisinopril (PRINIVIL,ZESTRIL) 40 MG tablet Take 40 mg by  mouth daily.    . metFORMIN (GLUCOPHAGE-XR) 500 MG 24 hr tablet Take 1,000 mg by mouth 2 (two) times daily.     [name] senna (SENOKOT) 8.6 MG TABS tablet Take 1 tablet (8.6 mg total) by mouth daily. 120 each 0  . traMADol (ULTRAM) 50 MG tablet Take one tablet by mouth every 6 hours as needed for mild to moderate pain; Take two tablets by mouth every 6 hours as needed for severe pain 240 tablet 5   No current facility-administered medications for this visit.    ROS: See HPI for pertinent positives and negatives.   Physical Examination  Filed Vitals:   06/22/14 1326  BP: 127/83  Pulse: 73  Temp: 97.6 F (36.4 C)  TempSrc: Oral  Resp: 16  Height: 6' (  1.829 m)  Weight: 238 lb (107.956 kg)  SpO2: 95%   Body mass index is 32.27 kg/(m^2).   General: A&O x 3, WDWN, obese male  Pulmonary: Sym exp, diminished air movt, CTAB, no rales, rhonchi, & wheezing   Cardiac: RRR, Nl S1, S2, no detected murmur  Vascular:  Vessel  Right  Left   Radial  Palpable  Palpable   Brachial  Palpable  Palpable   Carotid  Palpable, without bruit  Palpable, without bruit   Aorta  Not palpable  N/A   Femoral  Palpable  Palpable   Popliteal  AKA  Not palpable   PT  AKA  Not palpable, not audible with Doppler   DP  AKA  Not palpable, biphasic with Doppler   peroneal AKA Not palpable, biphasic by Doppler   Gastrointestinal: soft, NTND, -G/R, - HSM, - palpable masses, - CVAT B   Musculoskeletal: M/S 5/5 throughout , R AKA , L foot viable with some mild dep. rubor, no gangrene or ulcers , no sensation in foot, scant hair growth on left great toe and left 4th toe.1+ non pitting edema in left lower leg and foot.  Neurologic: Pain and light touch intact in extremities except R AKA and L foot, Motor exam as listed above            ASSESSMENT: Javier Quinn is a 54 y.o. male who is s/p R AKA, known non-reconstructable LLE tibial disease, diabetic  neuropathy.  ABI's were not performed today, were requested at his last visit. On exam he has no signs of ischemia in his left foot or leg, he has a healed right AKA stump. Left pedal pulses are not palpable but peroneal and DP pulses are biphasically audible by Doppler.  Pt states that his orthopedic doctor determined that his right shoulder to right leg pain is likely from a pinched nerve in his back, that he needs an MRI of neck and back but that his insurance denied coverage. He walks daily with his prosthetic that rubs his right hip, he also must use a walker. Pt states he needs to contact Fritz Pickerel at [place] for further adjustments of his right AKA prosthetic.  On patient's 09/22/13 office visit Dr. Lianne Moris assessment reads as follows:  I reviewed this patient L leg runoff and I don't think he has any endovascular reconstruction options at the mid-distal peroneal is essentially obliterated despite my previous recollection.  I don't think the distal target is big enough to attempt a fem-peroneal bypass. Additionally, this patient's ABI already suggest medial calcification of the distal tibial arteries, so the distal target is likely to be too calcified for a bypass target. Based on the patient's vascular studies and examination, I have offered the patient: LLE ABI in 3 months.    PLAN:  I discussed in depth with the patient the nature of atherosclerosis, and emphasized the importance of maximal medical management including strict control of blood pressure, blood glucose, and lipid levels, obtaining regular exercise, and continued cessation of smoking.  The patient is aware that without maximal medical management the underlying atherosclerotic disease process will progress, limiting the benefit of any interventions.  Based on the patient's vascular studies and examination, pt will return to clinic in 3 months with left LE ABI.   The patient was given information about PAD including signs,  symptoms, treatment, what symptoms should prompt the patient to seek immediate medical care, and risk reduction measures to take.  Clemon Chambers, RN, MSN,  FNP-C [facility] Phone: [phone]  Clinic MD: Trula Slade  06/22/2014 1:29 PM

## 2014-07-02 ENCOUNTER — Other Ambulatory Visit: Payer: Self-pay

## 2014-07-10 ENCOUNTER — Other Ambulatory Visit: Payer: Self-pay

## 2014-07-10 MED ORDER — HYDROCODONE-ACETAMINOPHEN 5-325 MG PO TABS: ORAL_TABLET | ORAL | Status: DC

## 2014-07-10 NOTE — Telephone Encounter (Signed)
Rx faxed to [facility] @ [phone], phone number [phone]  

## 2014-07-11 ENCOUNTER — Other Ambulatory Visit: Payer: Self-pay | Admitting: *Deleted

## 2014-07-11 MED ORDER — HYDROCODONE-ACETAMINOPHEN 5-325 MG PO TABS: ORAL_TABLET | ORAL | Status: DC

## 2014-07-11 NOTE — Progress Notes (Signed)
Patient ID: Javier Quinn, male   DOB: March 03, 1960, 54 y.o.   MRN: [id number] 54 y.o.  Previous smoker quit in 2015  with PVD. No nonhealing ulcers. Lots of pain and swelling in feet. No history of CAD. Still works detailing cars. CRF;s poorly controlled DM and HTN quit smoking a month ago. No chest pain but limited activity. Denies dyspnea palpitations or syncope  05/27/12 myovue normal EF 50%   Surgery done 7/82 with no complications  1. Right superficial femoral artery to peroneal artery bypass with ipsilateral non-reverse greater saphenous vein  2. Open superficial femoral artery cannulation  3. Right leg runoff   Has not had good result Still with lots of pain and LE swelling ABI .79 on right and .56 on left. 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  [place] recently and has pain in right shoulder with limited ROM  Has painful mass over right wrist since second surgery   Saw  "chinese" hand doctor ??  Had neuroma "sucked" out but came back Still with shoulder pain Some pain in sole of left foot   Nitka has seen may need some neck surgery    ROS: Denies fever, malais, weight loss, blurry vision, decreased visual acuity, cough, sputum, SOB, hemoptysis, pleuritic pain, palpitaitons, heartburn, abdominal pain, melena, lower extremity edema, claudication, or rash.  All other systems reviewed and negative  General: Affect appropriate Chronically ill black male in wheel chair  HEENT: normal Neck supple with no adenopathy JVP normal no bruits no thyromegaly Lungs clear with no wheezing and good diaphragmatic motion Heart:  S1/S2 no murmur, no rub, gallop or click PMI normal Abdomen: benighn, BS positve, no tenderness, no AAA no bruit.  No HSM or HJR S/P right above knee amputation  No edema Neuro non-focal Skin warm and dry  Neuroma right wrist  No muscular weakness   Current Outpatient Prescriptions  Medication Sig Dispense Refill  .  acetaminophen (TYLENOL) 325 MG tablet Take 650 mg by mouth daily as needed for moderate pain.    [name] albuterol (PROVENTIL HFA;VENTOLIN HFA) 108 (90 BASE) MCG/ACT inhaler Inhale 2 puffs into the lungs every 4 (four) hours as needed for wheezing. 1 each 0  . atorvastatin (LIPITOR) 40 MG tablet Take 40 mg by mouth daily.    . clopidogrel (PLAVIX) 75 MG tablet Take 1 tablet (75 mg total) by mouth daily with breakfast. 30 tablet 6  . diltiazem (CARDIZEM CD) 120 MG 24 hr capsule Take 240 mg by mouth daily.    . divalproex (DEPAKOTE) 250 MG DR tablet Take 500 mg by mouth at bedtime.     . docusate sodium (COLACE) 100 MG capsule Take 100 mg by mouth 2 (two) times daily.     . furosemide (LASIX) 20 MG tablet Take 20 mg by mouth daily.    [name] gabapentin (NEURONTIN) 300 MG capsule Take 1 capsule (300 mg total) by mouth 3 (three) times daily. 90 capsule 11  . HYDROcodone-acetaminophen (NORCO) 5-325 MG per tablet Take one tablet by mouth every 6 hours as needed for mild to moderate pain. Do not exceed 4gm of APAP in 24 hours 120 tablet 0  . Insulin Glargine (TOUJEO SOLOSTAR) 300 UNIT/ML SOPN Inject 88 Units into the skin at bedtime.     . insulin lispro (HUMALOG) 100 UNIT/ML injection Inject 5-10 Units into the skin 3 (three) times daily with meals. If blood sugar is between 150-300 inject 5 units  subq. If blood sugar is greater than 300 inject 10 units subq.    . latanoprost (XALATAN) 0.005 % ophthalmic solution Place 1 drop into both eyes every morning.     [name] lisinopril (PRINIVIL,ZESTRIL) 40 MG tablet Take 40 mg by mouth daily.    . metFORMIN (GLUCOPHAGE-XR) 500 MG 24 hr tablet Take 1,000 mg by mouth 2 (two) times daily.     [name] senna (SENOKOT) 8.6 MG TABS tablet Take 1 tablet (8.6 mg total) by mouth daily. 120 each 0  . traMADol (ULTRAM) 50 MG tablet Take one tablet by mouth every 6 hours as needed for mild to moderate pain; Take two tablets by mouth every 6 hours as needed for severe pain 240 tablet 5   No  current facility-administered medications for this visit.    Allergies  Codeine  Electrocardiogram:   03/09/13 SR rate 85 insig Q waves 3, F poor R wave progression   Assessment and Plan EF:  Low normal myovue non ischemic stable f/u echo in a year PVD:  F/u Bridgett Larsson has not rehabed well needs walker and in wheel chair today HTN:  Well controlled.  Continue current medications and low sodium Dash type diet.   DM:  Discussed low carb diet.  Target hemoglobin A1c is 6.5 or less.  Continue current medications. Shoulder: ? Referred pain from neck getting CT and f/u Nitka Smoking:  Quit last year no active wheezing has inhaler

## 2014-07-13 ENCOUNTER — Encounter: Payer: Self-pay | Admitting: Cardiovascular Disease

## 2014-07-13 ENCOUNTER — Ambulatory Visit (INDEPENDENT_AMBULATORY_CARE_PROVIDER_SITE_OTHER): Payer: Medicaid Other | Admitting: Cardiovascular Disease

## 2014-07-13 VITALS — BP 144/98 | HR 75 | Ht 72.0 in

## 2014-07-13 DIAGNOSIS — I70219 Atherosclerosis of native arteries of extremities with intermittent claudication, unspecified extremity: Secondary | ICD-10-CM

## 2014-07-13 DIAGNOSIS — I10 Essential (primary) hypertension: Secondary | ICD-10-CM | POA: Diagnosis not present

## 2014-07-13 DIAGNOSIS — I739 Peripheral vascular disease, unspecified: Secondary | ICD-10-CM | POA: Diagnosis not present

## 2014-07-13 NOTE — Patient Instructions (Signed)
Medication Instructions:  NO CHANGES  Labwork: NONE  Testing/Procedures: NONE  Follow-Up: Your physician wants you to follow-up in: YEAR WITH  DR NISHAN You will receive a reminder letter in the mail two months in advance. If you don't receive a letter, please call our office to schedule the follow-up appointment.   Any Other Special Instructions Will Be Listed Below (If Applicable).   

## 2014-07-18 ENCOUNTER — Encounter: Payer: Self-pay | Admitting: Family

## 2014-07-20 ENCOUNTER — Ambulatory Visit (HOSPITAL_COMMUNITY)
Admission: RE | Admit: 2014-07-20 | Discharge: 2014-07-20 | Disposition: A | Discharge status: Home / Self Care | Payer: Medicaid Other | Source: Ambulatory Visit | Attending: Family | Admitting: Family

## 2014-07-20 ENCOUNTER — Ambulatory Visit (INDEPENDENT_AMBULATORY_CARE_PROVIDER_SITE_OTHER): Payer: Medicaid Other | Admitting: Family

## 2014-07-20 ENCOUNTER — Encounter: Payer: Self-pay | Admitting: Family

## 2014-07-20 VITALS — BP 134/75 | HR 87 | Temp 97.0°F | Resp 16 | Ht 72.0 in | Wt 240.0 lb

## 2014-07-20 DIAGNOSIS — Z89611 Acquired absence of right leg above knee: Secondary | ICD-10-CM

## 2014-07-20 DIAGNOSIS — Z9889 Other specified postprocedural states: Secondary | ICD-10-CM

## 2014-07-20 DIAGNOSIS — E1142 Type 2 diabetes mellitus with diabetic polyneuropathy: Secondary | ICD-10-CM

## 2014-07-20 DIAGNOSIS — E1159 Type 2 diabetes mellitus with other circulatory complications: Secondary | ICD-10-CM

## 2014-07-20 DIAGNOSIS — Z87891 Personal history of nicotine dependence: Secondary | ICD-10-CM

## 2014-07-20 DIAGNOSIS — G629 Polyneuropathy, unspecified: Secondary | ICD-10-CM

## 2014-07-20 DIAGNOSIS — I739 Peripheral vascular disease, unspecified: Secondary | ICD-10-CM

## 2014-07-20 DIAGNOSIS — Z95828 Presence of other vascular implants and grafts: Secondary | ICD-10-CM

## 2014-07-20 DIAGNOSIS — E1151 Type 2 diabetes mellitus with diabetic peripheral angiopathy without gangrene: Secondary | ICD-10-CM

## 2014-07-20 DIAGNOSIS — G5792 Unspecified mononeuropathy of left lower limb: Secondary | ICD-10-CM

## 2014-07-20 DIAGNOSIS — M792 Neuralgia and neuritis, unspecified: Secondary | ICD-10-CM

## 2014-07-20 NOTE — Progress Notes (Signed)
[facility] [place] HISTORY AND PHYSICAL -PAD  History of Present Illness Javier Quinn is a 54 y.o. male patient of Dr. Bridgett Larsson who presents with chief complaint: follow up for PAD. Previous operation(s) completed include: failed R SFA to peroneal bypass, PTA distal anastomosis, R AKA.  The patient's symptoms are: neuropathic pain in L foot and diffuse numbness if L foot.  The patient's treatment regimen currently included: maximal medical management, physical therapy has ended. [name] He continues to have mild swelling in his left foot/lower leg; swelling resolves overnight with left leg elevation.  The pt denies non healing wounds on his right AKA stump or left lower extremity.  He states his DM is under fair control, A1C November 2015 was 8.3, uncontrolled. He states his insulin was increased lately. He takes Plavix and Lipitor, no ASA, no anticoagulants.  He denies any history of stroke or TIA. At his visit on 12/22/13 I advised 3 months follow up with LLE ABI. ABI's not done again today, unclear as to why.  He states his sister emptied his bank account and he has no apt to go to Pt states he is waiting on an apt to become available.   Pt states that his orthopedic doctor determined that his right shoulder to right leg pain is likely from a pinched nerve in his back, that he needs an MRI of neck and back but that his insurance denied coverage.  The plantar surface of his left foot is painful with walking, the more he walks the more painful this becomes. This limits his walking.   Pt smoker: former smoker, quit in 2014  Pt meds include: Statin :Yes Betablocker: No ASA: No Other anticoagulants/antiplatelets: Plavix    Past Medical History  Diagnosis Date  . Diabetes mellitus without complication   . Hyperlipidemia   . Tobacco dependence   . GERD (gastroesophageal reflux disease)   . H/O hiatal hernia   . Peripheral vascular disease   . Hypertension      See's Dr Johnsie Cancel States   . PVD (peripheral vascular disease) with claudication 05/18/2012  . Atherosclerotic peripheral vascular disease with intermittent claudication 05/18/2012  . Unspecified essential hypertension 05/18/2012  . Type II or unspecified type diabetes mellitus with peripheral circulatory disorders, uncontrolled(250.72) 10/11/2012  . Dyslipidemia 08/10/2012  . Above knee amputation of right lower extremity     Social History History  Substance Use Topics  . Smoking status: Former Smoker -- 0.50 packs/day for 20 years    Types: Cigarettes    Quit date: 04/11/2012  . Smokeless tobacco: Former Systems developer  . Alcohol Use: No     Comment: occ    Family History Family History  Problem Relation Age of Onset  . Cancer Mother     pancreas  . Cancer Father   . Heart attack Sister     Past Surgical History  Procedure Laterality Date  . Bypass graft femoral-peroneal Right 06/15/2012    Procedure: BYPASS GRAFT Campbell Stall;  Surgeon: Conrad Crestone, MD;  Location: [place];  Service: Vascular;  Laterality: Right;  using right non-reversed saphenous vein  . Lower extremity angiogram Right 06/15/2012    Procedure: LOWER EXTREMITY ANGIOGRAM;  Surgeon: Conrad Caribou, MD;  Location: [place];  Service: Vascular;  Laterality: Right;  . Lower extremity angiogram Right 09/09/2012    Procedure: LOWER EXTREMITY ANGIOGRAM With Agioplasty Bypass Graft;  Surgeon: Conrad Melfa, MD;  Location: [place];  Service: Vascular;  Laterality: Right;  .  Amputation Right 09/28/2012    Procedure: AMPUTATION ABOVE KNEE;  Surgeon: Conrad Flat Rock, MD;  Location: [place];  Service: Vascular;  Laterality: Right;  . Ganglion cyst excision Right 08/16/2013    Procedure: REMOVAL GANGLION cyst  RIGHT WRIST;  Surgeon: Marianna Payment, MD;  Location: [place];  Service: Orthopedics;  Laterality: Right;  . Abdominal aortagram N/A 05/12/2012    Procedure: ABDOMINAL AORTAGRAM;  Surgeon: Conrad Philo, MD;  Location: [facility] CATH LAB;  Service:  Cardiovascular;  Laterality: N/A;  . Lower extremity angiogram  05/12/2012    Procedure: LOWER EXTREMITY ANGIOGRAM;  Surgeon: Conrad , MD;  Location: [facility] CATH LAB;  Service: Cardiovascular;;    Allergies  Allergen Reactions  . Codeine Other (See Comments)    Causes constipation    Current Outpatient Prescriptions  Medication Sig Dispense Refill  . acetaminophen (TYLENOL) 325 MG tablet Take 650 mg by mouth daily as needed for moderate pain.    [name] albuterol (PROVENTIL HFA;VENTOLIN HFA) 108 (90 BASE) MCG/ACT inhaler Inhale 2 puffs into the lungs every 4 (four) hours as needed for wheezing. 1 each 0  . atorvastatin (LIPITOR) 40 MG tablet Take 40 mg by mouth daily.    . clopidogrel (PLAVIX) 75 MG tablet Take 1 tablet (75 mg total) by mouth daily with breakfast. 30 tablet 6  . diltiazem (CARDIZEM CD) 120 MG 24 hr capsule Take 240 mg by mouth daily.    . divalproex (DEPAKOTE) 250 MG DR tablet Take 500 mg by mouth at bedtime.     . docusate sodium (COLACE) 100 MG capsule Take 100 mg by mouth 2 (two) times daily.     . furosemide (LASIX) 20 MG tablet Take 20 mg by mouth daily.    [name] gabapentin (NEURONTIN) 300 MG capsule Take 1 capsule (300 mg total) by mouth 3 (three) times daily. 90 capsule 11  . HYDROcodone-acetaminophen (NORCO) 5-325 MG per tablet Take one tablet by mouth every 6 hours as needed for mild to moderate pain. Do not exceed 4gm of APAP in 24 hours 120 tablet 0  . Insulin Glargine (TOUJEO SOLOSTAR) 300 UNIT/ML SOPN Inject 88 Units into the skin at bedtime.     . insulin lispro (HUMALOG) 100 UNIT/ML injection Inject 5-10 Units into the skin 3 (three) times daily with meals. If blood sugar is between 150-300 inject 5 units subq. If blood sugar is greater than 300 inject 10 units subq.    . latanoprost (XALATAN) 0.005 % ophthalmic solution Place 1 drop into both eyes every morning.     [name] lisinopril (PRINIVIL,ZESTRIL) 40 MG tablet Take 40 mg by mouth daily.    . metFORMIN (GLUCOPHAGE-XR)  500 MG 24 hr tablet Take 1,000 mg by mouth 2 (two) times daily.     [name] senna (SENOKOT) 8.6 MG TABS tablet Take 1 tablet (8.6 mg total) by mouth daily. 120 each 0  . traMADol (ULTRAM) 50 MG tablet Take one tablet by mouth every 6 hours as needed for mild to moderate pain; Take two tablets by mouth every 6 hours as needed for severe pain 240 tablet 5   No current facility-administered medications for this visit.    ROS: See HPI for pertinent positives and negatives.   Physical Examination   General: A&O x 3, WDWN, obese male, seated in his wheelchair  Pulmonary: Sym exp, diminished air movt, CTAB, no rales, rhonchi, & wheezing   Cardiac: RRR, Nl S1, S2, no detected murmur  Vascular:  Vessel  Right  Left   Radial  Palpable  Palpable   Brachial  Palpable  Palpable   Carotid  Palpable, without bruit  Palpable, without bruit   Aorta  Not palpable  N/A   Femoral  Not Palpable, pt sitting in wheelchair and is obese  Not Palpable, pt sitting in wheelchair and is obese  Popliteal  AKA  Not palpable   PT  AKA  Not palpable, not audible with Doppler   DP  AKA  Not palpable, monophasic with Doppler   peroneal AKA Not palpable, monophasic by Doppler   Gastrointestinal: soft, NTND, -G/R, - HSM, - palpable masses, - CVAT B   Musculoskeletal: M/S 5/5 throughout , R AKA with no evidence of wounds or ulcers,  left foot viable with some mild dep. rubor, no gangrene or ulcers , no sensation in foot, scant hair growth on left great toe and left 4th toe.trace non pitting edema in left lower leg and foot.  Neurologic: Pain and light touch intact in extremities except R AKA and L foot, Motor exam as listed above                 Outside Studies/Documentation: 16 pages from [facility] and [place] reviewed.    ASSESSMENT: KOA ZOELLER is a 54 y.o. male who is s/p R AKA, known  non-reconstructable LLE tibial disease, diabetic neuropathy.  ABI's were not performed today, apparently his insurance denied coverage.  Last ABI's performed were on 12/22/13 and indicated 0.69 left ABI with monophasic AT and PT waveforms; left great toe TBI was 0.26 (42 toe pressure). Right LE with AKA. His biggest barrier to walking seems to be the neuropathic left foot pain in the plantar surface of his foot; pain worsens with more walking; he denies pain in his foot when not weight bearing, but has chronic numbness in his left foot. He reports that he last saw a podiatrist at [facility] about January of 2015 or 2014 to cut his toenails; states he can cut his own toenails. I explained to pt that a podiatrist has tools to help with diabetic neuropathic foot pain, and that he should get regular foot exams as a diabetic with uncontrolled blood sugars.   Discussed pt HPI and today's exam results with Dr. Bridgett Larsson.  PLAN:  Pt referred to [facility] for help with alleviating left foot DM neuropathic pain in order for pt to be able to walk more, and regular diabetic foot exams.   Graduated walking program as soon as left foot neuropathic pain is under better control.   Based on the patient's vascular studies and examination, pt will return to clinic in 3 months with ABI's if his insurance will cover. He knows to return sooner if he develops non healing wounds in his left foot or leg or right AKA stump.  I discussed in depth with the patient the nature of atherosclerosis, and emphasized the importance of maximal medical management including strict control of blood pressure, blood glucose, and lipid levels, obtaining regular exercise, and continued cessation of smoking.  The patient is aware that without maximal medical management the underlying atherosclerotic disease process will progress, limiting the benefit of any interventions.  The patient was given information about PAD including  signs, symptoms, treatment, what symptoms should prompt the patient to seek immediate medical care, and risk reduction measures to take.  Clemon Chambers, RN, MSN, FNP-C [facility] Phone: [phone]  Clinic MD:  Chen  07/20/2014 2:38 PM

## 2014-07-20 NOTE — Patient Instructions (Signed)

## 2014-07-23 NOTE — Addendum Note (Signed)
Addended by: Dorthula Rue L on: 07/23/2014 10:17 AM   Modules accepted: Orders

## 2014-08-15 ENCOUNTER — Ambulatory Visit (INDEPENDENT_AMBULATORY_CARE_PROVIDER_SITE_OTHER): Payer: Medicaid Other | Admitting: Podiatry

## 2014-08-15 ENCOUNTER — Ambulatory Visit (INDEPENDENT_AMBULATORY_CARE_PROVIDER_SITE_OTHER): Payer: Medicaid Other

## 2014-08-15 ENCOUNTER — Encounter: Payer: Self-pay | Admitting: Podiatry

## 2014-08-15 VITALS — BP 166/90 | HR 84 | Resp 12

## 2014-08-15 DIAGNOSIS — M722 Plantar fascial fibromatosis: Secondary | ICD-10-CM

## 2014-08-15 DIAGNOSIS — R52 Pain, unspecified: Secondary | ICD-10-CM

## 2014-08-15 NOTE — Progress Notes (Signed)
   Subjective:    Patient ID: Javier Quinn, male    DOB: 09-29-1960, 54 y.o.   MRN: [id number]  HPI  This patient presents today complaining of approximately 2 month history of some swelling, numbness and pain in the left foot that is aggravated upon weight-bearing with some reduction of pain with rest. He says that the left foot is quite uncomfortable when he wears his prosthetic limb on the right. He says that the pain seems to be more localized into the heel area and results in him having to stop at a proximally 200 foot range on a consistent basis. The numbness is rather persistent on and off weightbearing. Patient has a history of above-knee amputation on the right and wears a prosthetic limb somewhat on a intermittent basis. He also uses a wheelchair when he does not use his prosthetic limb. He wears a athletic shoe on his left foot that isn't a good state of repair  Review of Systems  Respiratory: Positive for cough.   Cardiovascular: Positive for leg swelling.  Musculoskeletal: Positive for myalgias, back pain, joint swelling and gait problem.       Objective:   Physical Exam  Orientated 3  Vascular: Pitting edema left extremity No calf pain  Vascular: DP and PT pulses 1/4 bilaterally Capillary reflex immediate bilaterally  Neurological: Sensation to 10 g monofilament wire intact 0/5 left Vibratory sensation reactive left Ankle reflex reactive left  Dermatological: Toenails 1-5 left are hypertrophic elongated, brittle and discolored No open lesions noted left  Musculoskeletal: Prosthetic limb right lower extremity Severe HAV deformity left Palpable tenderness in the medial plantar heel left without any palpable lesions Mild palpable tenderness medial midfoot left without any palpable lesions  X-ray examination left foot  Intact bony structure without fracture and/or dislocation Bone density appears to be adequate Joint spaces adequate No emphysema noted HAV  deformity  Radiographic impression: No acute bony abnormality noted in the left foot      Assessment & Plan:   Assessment: Peripheral vascular disease left Diabetic peripheral neuropathy left Plantar fasciitis left Weight transfer from prosthetic limb left causing generalized foot pain  Plan: Today review the results examination with patient today. I'm recommending a custom or foot orthotic for the left foot to give it additional support primarily for the plantar fasciitis left and generalized arthralgia in the foot  Refer to [facility] Custom foot orthotic Rigid foot orthotic Extrinsic rear foot post 0 Forefoot intrinsic Distal edge of orthotic stops proximal to MPJ  Patient return after wears custom foot orthotic to check response :

## 2014-08-15 NOTE — Patient Instructions (Signed)
Am referring you to [facility] for custom foot orthotic for your left foot to treat plantar fasciitis and generalized foot pain  Plantar Fasciitis Plantar fasciitis is a common condition that causes foot pain. It is soreness (inflammation) of the band of tough fibrous tissue on the bottom of the foot that runs from the heel bone (calcaneus) to the ball of the foot. The cause of this soreness may be from excessive standing, poor fitting shoes, running on hard surfaces, being overweight, having an abnormal walk, or overuse (this is common in runners) of the painful foot or feet. It is also common in aerobic exercise dancers and ballet dancers. SYMPTOMS  Most people with plantar fasciitis complain of:  Severe pain in the morning on the bottom of their foot especially when taking the first steps out of bed. This pain recedes after a few minutes of walking.  Severe pain is experienced also during walking following a long period of inactivity.  Pain is worse when walking barefoot or up stairs DIAGNOSIS   Your caregiver will diagnose this condition by examining and feeling your foot.  Special tests such as X-rays of your foot, are usually not needed. PREVENTION   Consult a sports medicine professional before beginning a new exercise program.  Walking programs offer a good workout. With walking there is a lower chance of overuse injuries common to runners. There is less impact and less jarring of the joints.  Begin all new exercise programs slowly. If problems or pain develop, decrease the amount of time or distance until you are at a comfortable level.  Wear good shoes and replace them regularly.  Stretch your foot and the heel cords at the back of the ankle (Achilles tendon) both before and after exercise.  Run or exercise on even surfaces that are not hard. For example, asphalt is better than pavement.  Do not run barefoot on hard surfaces.  If using a treadmill, vary the incline.  Do not  continue to workout if you have foot or joint problems. Seek professional help if they do not improve. HOME CARE INSTRUCTIONS   Avoid activities that cause you pain until you recover.  Use ice or cold packs on the problem or painful areas after working out.  Only take over-the-counter or prescription medicines for pain, discomfort, or fever as directed by your caregiver.  Soft shoe inserts or athletic shoes with air or gel sole cushions may be helpful.  If problems continue or become more severe, consult a sports medicine caregiver or your own health care provider. Cortisone is a potent anti-inflammatory medication that may be injected into the painful area. You can discuss this treatment with your caregiver. MAKE SURE YOU:   Understand these instructions.  Will watch your condition.  Will get help right away if you are not doing well or get worse. Document Released: 09/16/2000 Document Revised: 03/16/2011 Document Reviewed: 11/16/2007 [facility] Patient Information 2015 [place], [place]. This information is not intended to replace advice given to you by your health care provider. Make sure you discuss any questions you have with your health care provider.

## 2014-08-24 ENCOUNTER — Other Ambulatory Visit: Payer: Self-pay | Admitting: *Deleted

## 2014-08-24 ENCOUNTER — Other Ambulatory Visit: Payer: Self-pay | Admitting: Specialist

## 2014-08-24 DIAGNOSIS — M545 Low back pain: Secondary | ICD-10-CM

## 2014-08-24 MED ORDER — HYDROCODONE-ACETAMINOPHEN 10-325 MG PO TABS: ORAL_TABLET | ORAL | Status: DC

## 2014-08-24 NOTE — Telephone Encounter (Signed)
[facility] 

## 2014-08-30 ENCOUNTER — Other Ambulatory Visit: Payer: Medicaid Other

## 2014-08-31 ENCOUNTER — Ambulatory Visit
Admission: RE | Admit: 2014-08-31 | Discharge: 2014-08-31 | Disposition: A | Discharge status: Home / Self Care | Payer: Medicaid Other | Source: Ambulatory Visit | Attending: Specialist | Admitting: Specialist

## 2014-08-31 DIAGNOSIS — M545 Low back pain: Secondary | ICD-10-CM

## 2014-09-05 ENCOUNTER — Telehealth: Payer: Self-pay | Admitting: *Deleted

## 2014-09-05 NOTE — Telephone Encounter (Signed)
Received a fax from [employer] [phone] regarding temporary discontinuation of Plavix 75mg . They are needing Dr. Dellia Nims to sign for documentation to provide safe and accurate care for patient. Faxed to [place] for Dr. Dellia Nims to review and sign. Also faxed to [employer] Fax: [phone] (attn: Dr. Ernestina Patches Scheduling) with note stating order was faxed there for signature.

## 2014-09-18 ENCOUNTER — Non-Acute Institutional Stay (SKILLED_NURSING_FACILITY): Payer: Medicaid Other | Admitting: Internal Medicine

## 2014-09-18 DIAGNOSIS — E785 Hyperlipidemia, unspecified: Secondary | ICD-10-CM

## 2014-09-18 DIAGNOSIS — S78111A Complete traumatic amputation at level between right hip and knee, initial encounter: Secondary | ICD-10-CM

## 2014-09-18 DIAGNOSIS — I10 Essential (primary) hypertension: Secondary | ICD-10-CM

## 2014-09-18 DIAGNOSIS — Z89611 Acquired absence of right leg above knee: Secondary | ICD-10-CM

## 2014-09-18 DIAGNOSIS — E1151 Type 2 diabetes mellitus with diabetic peripheral angiopathy without gangrene: Secondary | ICD-10-CM

## 2014-09-18 DIAGNOSIS — IMO0002 Reserved for concepts with insufficient information to code with codable children: Secondary | ICD-10-CM

## 2014-09-18 DIAGNOSIS — E1165 Type 2 diabetes mellitus with hyperglycemia: Principal | ICD-10-CM

## 2014-09-18 DIAGNOSIS — E1159 Type 2 diabetes mellitus with other circulatory complications: Secondary | ICD-10-CM

## 2014-09-21 ENCOUNTER — Ambulatory Visit: Payer: Medicaid Other | Admitting: Family

## 2014-09-21 ENCOUNTER — Encounter (HOSPITAL_COMMUNITY): Payer: Medicaid Other

## 2014-09-24 ENCOUNTER — Encounter: Payer: Self-pay | Admitting: Internal Medicine

## 2014-09-24 NOTE — Progress Notes (Signed)
Patient ID: Javier Quinn, male   DOB: 01-Sep-1960, 54 y.o.   MRN: [id number]                PROGRESS NOTE  DATE:  09/18/2014        FACILITY: [facility]                 LEVEL OF CARE:   SNF   Acute Visit/Discharge Visit   CHIEF COMPLAINT:   Pre-discharge review.      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    He was discovered to be a type 2 diabetic, I think during this hospitalization.   He was discharged here on insulin.    He became an ongoing patient in the building.   He spent most of the time that I would see him in the wheelchair, although he is capable of using his above-knee prosthesis and walking with a walker.     His recent issues have included pain in his right arm and his right leg for which he is being seen by Orthopedics.  Apparently, plans are being made for an epidural injection in his neck.    He also follows with Dr. Adele Barthel of Vascular Surgery.  There is little doubt in my mind that he has claudication when he walks.  However, he is really quite functional.    Plans are being made for him to go to a handicapped apartment.       PAST MEDICAL HISTORY/PROBLEM LIST:                Type 2 diabetes with macrovascular disease.     Hyperlipidemia.    Tobacco dependence, although he is not smoking.     Gastroesophageal reflux disease.      History of hiatal hernia.    Hypertension.      Cervical spondylosis.     Glaucoma.     SOCIAL HISTORY:             HOUSING:  The patient is going to a handicapped apartment in [facility].    TOBACCO:  He no longer smokes.    FUNCTIONAL STATUS:   He is able to use his prosthesis with a walker.    CURRENT MEDICATIONS:  Medication list is reviewed.                 Depakote ER 750 q.h.s.       Lasix 20 mg a day.     Plavix 75 q.d.       Xalatan 0.005%, 1 drop into each eye for glaucoma.    Zestril 40 mg a day.      Diltiazem 240 q.d.      Neurontin 600 b.i.d.      Metformin 1000 b.i.d.     Toujeo Solo 88 U subcu every night.    Humalog 5 U subcutaneously three times a day.    Lipitor 40 q.d.     Neurontin 300 at noon.     Colace 100 b.i.d.     Senokot S, 1 tablet at bedtime.      LABORATORY DATA:   Last lab work that we did on this man was in June at which time his hemoglobin A1c was 8.7.      He has not had a recent BUN and creatinine, last done in February.  However, this was normal.    His  CBC was normal at that time.     REVIEW OF SYSTEMS:    HEENT:   No headache.     CHEST/RESPIRATORY:  No cough.  No sputum.    CARDIAC:  No chest pain.   GI:  No abdominal pain.    GU:  No voiding difficulties.     MUSCULOSKELETAL:  Complains of pain in the right shoulder, right arm, and also his right stump.   VASCULAR:  I think he pretty clearly has claudication in the left leg.    PHYSICAL EXAMINATION:   VITAL SIGNS:     TEMPERATURE:  98.    PULSE:  80.    RESPIRATIONS:  18.    BLOOD PRESSURE:  136/76.     WEIGHT:  240 pounds.     GENERAL APPEARANCE:   The patient is not in any distress.      CHEST/RESPIRATORY:  Clear air entry bilaterally.    CARDIOVASCULAR:   CARDIAC:  Heart sounds are normal.  There are no murmurs.    GASTROINTESTINAL:   ABDOMEN:  Soft.  No masses.     CIRCULATION:   EDEMA/VARICOSITIES:  Extremities:  He has mild 1-2+ pitting edema to his mid calf on the left.    ARTERIAL:  Peripheral pulses are absent.   Papillary refill is delayed.     ASSESSMENT/PLAN:                  He is being discharged to an independent apartment.   He will need a walker, PT, OT, and skilled nursing.     Type 2 diabetes with macrovascular disease.   His blood sugars appear to be much better, 130-150 range fasting.  However, he is in the low 200s to low 300s a.c. dinner.   His last hemoglobin A1c on 09/10/2014 was 8.6, reflecting this.    Hyperlipidemia.  Last LDL was 35 on  09/10/2014.     Hypertension.  His blood pressure appears to be reasonably well controlled.     Left leg edema.  I recently started him on Lasix earlier this month, 20 mg daily.   There does not appear to be a major issue here.  No evidence of a DVT.    Mood disorder.  On Depakote.  I think this was started by Psychiatry.  I was not predominantly involved in this.  I am really not certain that he requires this and I think it can be tapered when he meets his primary doctor.    I have written scripts for a walker.  He will need PT, OT, and skilled nursing.   The facility is getting him trained in terms of his insulin injections.  I will probably increase his short-acting insulin a.c. meals, leave the Toujeo at 88 U.  His recent lab work is stable.     CPT CODE: 81829 (well over 30 minutes spent in direct patient care, and I have extensively discussed this with the social worker and Therapy)   ADDENDUM:     DISCHARGE MEDICATIONS:    Plavix 75 q.d.      Xalatan 0.005%, 1 drop OU q.d.     Lisinopril 40 q.d.     Diltiazem CD 240 q.d.     Neurontin 600 mg in the morning and at bedtime, and 300 mg at noon.    Metformin 1000 b.i.d.     Toujeo Solo 88 U subcu q.h.s.      Humalog 7 U a.c. meals.  Atorvastatin 40 q.d.    Colace 100 b.i.d.      Depakote ER 750 q.h.s.       Lasix 20 q.d.       Norco/APAP 10/325, 1 p.o. q.4 h p.r.n., #20 supplied.

## 2014-10-03 ENCOUNTER — Other Ambulatory Visit: Payer: Self-pay | Admitting: Internal Medicine

## 2014-10-03 ENCOUNTER — Ambulatory Visit (HOSPITAL_COMMUNITY): Admission: RE | Admit: 2014-10-03 | Payer: Medicaid Other | Source: Ambulatory Visit

## 2014-10-03 DIAGNOSIS — M79662 Pain in left lower leg: Secondary | ICD-10-CM

## 2014-10-04 ENCOUNTER — Emergency Department (HOSPITAL_COMMUNITY)
Admission: EM | Admit: 2014-10-04 | Discharge: 2014-10-04 | Disposition: A | Discharge status: Home / Self Care | Payer: Medicaid Other | Attending: Emergency Medicine | Admitting: Emergency Medicine

## 2014-10-04 ENCOUNTER — Encounter (HOSPITAL_COMMUNITY): Payer: Self-pay | Admitting: *Deleted

## 2014-10-04 ENCOUNTER — Emergency Department (HOSPITAL_BASED_OUTPATIENT_CLINIC_OR_DEPARTMENT_OTHER): Payer: Medicaid Other

## 2014-10-04 ENCOUNTER — Emergency Department (HOSPITAL_COMMUNITY): Payer: Medicaid Other

## 2014-10-04 DIAGNOSIS — Z794 Long term (current) use of insulin: Secondary | ICD-10-CM | POA: Insufficient documentation

## 2014-10-04 DIAGNOSIS — Z79899 Other long term (current) drug therapy: Secondary | ICD-10-CM | POA: Insufficient documentation

## 2014-10-04 DIAGNOSIS — E785 Hyperlipidemia, unspecified: Secondary | ICD-10-CM | POA: Insufficient documentation

## 2014-10-04 DIAGNOSIS — Z7902 Long term (current) use of antithrombotics/antiplatelets: Secondary | ICD-10-CM | POA: Insufficient documentation

## 2014-10-04 DIAGNOSIS — R2242 Localized swelling, mass and lump, left lower limb: Secondary | ICD-10-CM | POA: Insufficient documentation

## 2014-10-04 DIAGNOSIS — M79609 Pain in unspecified limb: Secondary | ICD-10-CM

## 2014-10-04 DIAGNOSIS — M7989 Other specified soft tissue disorders: Secondary | ICD-10-CM

## 2014-10-04 DIAGNOSIS — Z87891 Personal history of nicotine dependence: Secondary | ICD-10-CM | POA: Diagnosis not present

## 2014-10-04 DIAGNOSIS — E119 Type 2 diabetes mellitus without complications: Secondary | ICD-10-CM | POA: Insufficient documentation

## 2014-10-04 DIAGNOSIS — K219 Gastro-esophageal reflux disease without esophagitis: Secondary | ICD-10-CM | POA: Diagnosis not present

## 2014-10-04 DIAGNOSIS — I10 Essential (primary) hypertension: Secondary | ICD-10-CM | POA: Diagnosis not present

## 2014-10-04 LAB — CBC WITH DIFFERENTIAL/PLATELET
Basophils Absolute: 0 10*3/uL (ref 0.0–0.1)
Basophils Relative: 0 %
Eosinophils Absolute: 0.1 10*3/uL (ref 0.0–0.7)
Eosinophils Relative: 2 %
HEMATOCRIT: 38.9 % — AB (ref 39.0–52.0)
HEMOGLOBIN: 13.4 g/dL (ref 13.0–17.0)
LYMPHS ABS: 1.5 10*3/uL (ref 0.7–4.0)
LYMPHS PCT: 31 %
MCH: 29.4 pg (ref 26.0–34.0)
MCHC: 34.4 g/dL (ref 30.0–36.0)
MCV: 85.3 fL (ref 78.0–100.0)
MONOS PCT: 9 %
Monocytes Absolute: 0.4 10*3/uL (ref 0.1–1.0)
NEUTROS ABS: 2.8 10*3/uL (ref 1.7–7.7)
NEUTROS PCT: 58 %
Platelets: 242 10*3/uL (ref 150–400)
RBC: 4.56 MIL/uL (ref 4.22–5.81)
RDW: 12.8 % (ref 11.5–15.5)
WBC: 4.7 10*3/uL (ref 4.0–10.5)

## 2014-10-04 LAB — BASIC METABOLIC PANEL
ANION GAP: 9 (ref 5–15)
BUN: 22 mg/dL — ABNORMAL HIGH (ref 6–20)
CHLORIDE: 106 mmol/L (ref 101–111)
CO2: 24 mmol/L (ref 22–32)
Calcium: 9.6 mg/dL (ref 8.9–10.3)
Creatinine, Ser: 1.1 mg/dL (ref 0.61–1.24)
GFR calc non Af Amer: >60 mL/min (ref >60)
Glucose, Bld: 129 mg/dL — ABNORMAL HIGH (ref 65–99)
Potassium: 4.3 mmol/L (ref 3.5–5.1)
Sodium: 139 mmol/L (ref 135–145)

## 2014-10-04 LAB — BRAIN NATRIURETIC PEPTIDE: B Natriuretic Peptide: 51.4 pg/mL (ref 0.0–100.0)

## 2014-10-04 NOTE — ED Notes (Signed)
Patient transported to X-ray 

## 2014-10-04 NOTE — ED Notes (Signed)
Case management consulted for wheelchair and a cab voucher in order to d/c pt home.

## 2014-10-04 NOTE — ED Notes (Signed)
Patient ambulated well and independently down the hallway and back with the assistance of his walker.

## 2014-10-04 NOTE — Progress Notes (Signed)
CSW provided pt with taxi voucher transportation home. 

## 2014-10-04 NOTE — ED Provider Notes (Signed)
CSN: [id number]     Arrival date & time 10/04/14  0935 History   First MD Initiated Contact with Patient 10/04/14 1004     Chief Complaint  Patient presents with  . Leg Pain     Patient is a 54 y.o. male presenting with leg pain. The history is provided by the patient. No language interpreter was used.  Leg Pain  Javier Quinn presents for evaluation of left leg pain. He has a history of diabetes and peripheral vascular disease. He had a prior right AKA. For the last 1-3 weeks reports increased swelling in his left lower extremity. For the last week he has had pain with ambulation bearing weight to the bottom of his left foot. He feels that he cannot get around safely at home. His home cannot accommodate a wheelchair so he is forced to use a walker. He was discharged from [facility] rehabilitation about a week ago. He denies any fevers, chest pain, shortness of breath, abdominal pain.  Past Medical History  Diagnosis Date  . Diabetes mellitus without complication   . Hyperlipidemia   . Tobacco dependence   . GERD (gastroesophageal reflux disease)   . H/O hiatal hernia   . Peripheral vascular disease   . Hypertension     See's Dr Johnsie Cancel States   . PVD (peripheral vascular disease) with claudication 05/18/2012  . Atherosclerotic peripheral vascular disease with intermittent claudication 05/18/2012  . Unspecified essential hypertension 05/18/2012  . Type II or unspecified type diabetes mellitus with peripheral circulatory disorders, uncontrolled(250.72) 10/11/2012  . Dyslipidemia 08/10/2012  . Above knee amputation of right lower extremity    Past Surgical History  Procedure Laterality Date  . Bypass graft femoral-peroneal Right 06/15/2012    Procedure: BYPASS GRAFT Campbell Stall;  Surgeon: Conrad Avalon, MD;  Location: [place];  Service: Vascular;  Laterality: Right;  using right non-reversed saphenous vein  . Lower extremity angiogram Right 06/15/2012    Procedure: LOWER EXTREMITY ANGIOGRAM;   Surgeon: Conrad Rickardsville, MD;  Location: [place];  Service: Vascular;  Laterality: Right;  . Lower extremity angiogram Right 09/09/2012    Procedure: LOWER EXTREMITY ANGIOGRAM With Agioplasty Bypass Graft;  Surgeon: Conrad Foster, MD;  Location: [place];  Service: Vascular;  Laterality: Right;  . Amputation Right 09/28/2012    Procedure: AMPUTATION ABOVE KNEE;  Surgeon: Conrad Providence, MD;  Location: [place];  Service: Vascular;  Laterality: Right;  . Ganglion cyst excision Right 08/16/2013    Procedure: REMOVAL GANGLION cyst  RIGHT WRIST;  Surgeon: Marianna Payment, MD;  Location: [place];  Service: Orthopedics;  Laterality: Right;  . Abdominal aortagram N/A 05/12/2012    Procedure: ABDOMINAL AORTAGRAM;  Surgeon: Conrad Hatillo, MD;  Location[facility] CATH LAB;  Service: Cardiovascular;  Laterality: N/A;  . Lower extremity angiogram  05/12/2012    Procedure: LOWER EXTREMITY ANGIOGRAM;  Surgeon: Conrad Kamrar, MD;  Location: [facility] CATH LAB;  Service: Cardiovascular;;   Family History  Problem Relation Age of Onset  . Cancer Mother     pancreas  . Cancer Father   . Heart attack Sister    Social History  Substance Use Topics  . Smoking status: Former Smoker -- 0.50 packs/day for 20 years    Types: Cigarettes    Quit date: 04/11/2012  . Smokeless tobacco: Former Systems developer  . Alcohol Use: No     Comment: occ    Review of Systems  All other systems reviewed and are negative.  Allergies  Codeine  Home Medications   Prior to Admission medications   Medication Sig Start Date End Date Taking? Authorizing Provider  acetaminophen (TYLENOL) 325 MG tablet Take 650 mg by mouth daily as needed for moderate pain.   Yes Historical Provider, MD  albuterol (PROVENTIL HFA;VENTOLIN HFA) 108 (90 BASE) MCG/ACT inhaler Inhale 2 puffs into the lungs every 4 (four) hours as needed for wheezing. 08/04/12  Yes Ezequiel Essex, MD  atorvastatin (LIPITOR) 40 MG tablet Take 40 mg by mouth daily.   Yes Historical Provider, MD   clopidogrel (PLAVIX) 75 MG tablet Take 1 tablet (75 mg total) by mouth daily with breakfast. 06/24/12  Yes Samantha J Rhyne, PA-C  diltiazem (CARDIZEM CD) 240 MG 24 hr capsule Take 240 mg by mouth daily.   Yes Historical Provider, MD  divalproex (DEPAKOTE ER) 250 MG 24 hr tablet Take 750 mg by mouth at bedtime.   Yes Historical Provider, MD  docusate sodium (COLACE) 100 MG capsule Take 100 mg by mouth 2 (two) times daily.    Yes Historical Provider, MD  furosemide (LASIX) 20 MG tablet Take 20 mg by mouth daily.   Yes Historical Provider, MD  furosemide (LASIX) 20 MG tablet Take 20 mg by mouth daily.   Yes Historical Provider, MD  gabapentin (NEURONTIN) 300 MG capsule Take 1 capsule (300 mg total) by mouth 3 (three) times daily. 11/18/12  Yes Gerlene Fee, NP  HYDROcodone-acetaminophen (NORCO) 10-325 MG per tablet Take one tablet by mouth every 4 hours as needed for pain. Max of 6 per day. Do not exceed 3gm of Tylenol in 24 hours 08/24/14  Yes Tiffany L Reed, DO  insulin lispro (HUMALOG) 100 UNIT/ML injection Inject 7 Units into the skin 3 (three) times daily with meals.    Yes Historical Provider, MD  latanoprost (XALATAN) 0.005 % ophthalmic solution Place 1 drop into both eyes every morning.    Yes Historical Provider, MD  lisinopril (PRINIVIL,ZESTRIL) 40 MG tablet Take 40 mg by mouth daily.   Yes Historical Provider, MD  lisinopril (PRINIVIL,ZESTRIL) 40 MG tablet Take 40 mg by mouth daily.   Yes Historical Provider, MD  metFORMIN (GLUCOPHAGE) 1000 MG tablet Take 1,000 mg by mouth 2 (two) times daily with a meal.   Yes Historical Provider, MD  senna (SENOKOT) 8.6 MG TABS tablet Take 1 tablet (8.6 mg total) by mouth daily. 11/29/13  Yes Lauree Chandler, NP  traMADol (ULTRAM) 50 MG tablet Take one tablet by mouth every 6 hours as needed for mild to moderate pain; Take two tablets by mouth every 6 hours as needed for severe pain 08/28/13  Yes Tiffany L Reed, DO  diltiazem (CARDIZEM CD) 120 MG 24 hr  capsule Take 240 mg by mouth daily.    Historical Provider, MD  divalproex (DEPAKOTE) 250 MG DR tablet Take 500 mg by mouth at bedtime.     Historical Provider, MD  HYDROcodone-acetaminophen (NORCO) 5-325 MG per tablet Take one tablet by mouth every 6 hours as needed for mild to moderate pain. Do not exceed 4gm of APAP in 24 hours 07/11/14   Gildardo Cranker, DO  Insulin Glargine (TOUJEO SOLOSTAR) 300 UNIT/ML SOPN Inject 88 Units into the skin at bedtime.     Historical Provider, MD  metFORMIN (GLUCOPHAGE-XR) 500 MG 24 hr tablet Take 1,000 mg by mouth 2 (two) times daily.  08/10/12   Clanford L Johnson, MD   BP 124/62 mmHg  Pulse 86  Temp(Src) 98.2 F (36.8 C)  Ht 6' (1.829 m)  Wt 240 lb (108.863 kg)  BMI 32.54 kg/m2  SpO2 92% Physical Exam  Constitutional: He is oriented to person, place, and time. He appears well-developed and well-nourished.  HENT:  Head: Normocephalic and atraumatic.  Cardiovascular: Normal rate and regular rhythm.   No murmur heard. Pulmonary/Chest: Effort normal and breath sounds normal. No respiratory distress.  Abdominal: Soft. There is no tenderness. There is no rebound and no guarding.  Musculoskeletal:  2+ pitting edema in the left lower extremity from the foot and the knee. There is mild diffuse tenderness to palpation. 2+ DP pulses left lower extremity. Right lower extremity with AKA and prosthesis in place.  Neurological: He is alert and oriented to person, place, and time.  Skin: Skin is warm and dry.  Psychiatric: He has a normal mood and affect. His behavior is normal.  Nursing note and vitals reviewed.   ED Course  Procedures (including critical care time) Labs Review Labs Reviewed  BASIC METABOLIC PANEL - Abnormal; Notable for the following:    Glucose, Bld 129 (*)    BUN 22 (*)    All other components within normal limits  CBC WITH DIFFERENTIAL/PLATELET - Abnormal; Notable for the following:    HCT 38.9 (*)    All other components within normal  limits  BRAIN NATRIURETIC PEPTIDE    Imaging Review Dg Chest 2 View  10/04/2014   CLINICAL DATA:  Leg edema  EXAM: CHEST  2 VIEW  COMPARISON:  08/16/2013  FINDINGS: Heart size within normal limits. Negative for heart failure. Lungs are clear without infiltrate or effusion.  The lateral view is nondiagnostic due to underpenetration.  IMPRESSION: No active cardiopulmonary disease.   Electronically Signed   By: Franchot Gallo M.D.   On: 10/04/2014 11:37   I have personally reviewed and evaluated these images and lab results as part of my medical decision-making.   EKG Interpretation None      MDM   Final diagnoses:  Left leg swelling    Patient with history of diabetes and prior right AKA here for swelling and pain to the left lower extremity. No evidence of acute congestive heart failure, acute infection, DVT. Patient is requesting placement back in [place]. Discussed with social work to evaluate his options and patient is not eligible for placement back in assisted living at this time. Patient is able to and related in the department with a walker. Patient was offered wheelchair to help him with mobility at home. Discussed with patient follow-up with his primary care provider for assistance with placement in assisted living.    Quintella Reichert, MD 10/04/14 [phone]

## 2014-10-04 NOTE — Progress Notes (Signed)
VASCULAR LAB PRELIMINARY  PRELIMINARY  PRELIMINARY  PRELIMINARY  Left lower extremity venous duplex completed.    Preliminary report:  Left:  No evidence of DVT, superficial thrombosis, or Baker's cyst.  Cestone,Helene, RVT 10/04/2014, 11:15 AM
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
## 2014-10-04 NOTE — ED Notes (Signed)
Pt arrives from home. Pt states he has pain in the center of his leg from his knee down to his foot. Pt states, "it feels like the other one did". Pt has a AKA on the right side. Pt has positive edema +2 on left leg and has a palpable pedal pulse.

## 2014-10-04 NOTE — Discharge Instructions (Signed)
Peripheral Edema °You have swelling in your legs (peripheral edema). This swelling is due to excess accumulation of salt and water in your body. Edema may be a sign of heart, kidney or liver disease, or a side effect of a medication. It may also be due to problems in the leg veins. Elevating your legs and using special support stockings may be very helpful, if the cause of the swelling is due to poor venous circulation. Avoid long periods of standing, whatever the cause. °Treatment of edema depends on identifying the cause. Chips, pretzels, pickles and other salty foods should be avoided. Restricting salt in your diet is almost always needed. Water pills (diuretics) are often used to remove the excess salt and water from your body via urine. These medicines prevent the kidney from reabsorbing sodium. This increases urine flow. °Diuretic treatment may also result in lowering of potassium levels in your body. Potassium supplements may be needed if you have to use diuretics daily. Daily weights can help you keep track of your progress in clearing your edema. You should call your caregiver for follow up care as recommended. °SEEK IMMEDIATE MEDICAL CARE IF:  °· You have increased swelling, pain, redness, or heat in your legs. °· You develop shortness of breath, especially when lying down. °· You develop chest or abdominal pain, weakness, or fainting. °· You have a fever. °Document Released: 01/30/2004 Document Revised: 03/16/2011 Document Reviewed: 01/09/2009 °ExitCare® Patient Information ©2015 [employer]. This information is not intended to replace advice given to you by your health care provider. Make sure you discuss any questions you have with your health care provider. ° °

## 2014-10-04 NOTE — ED Notes (Signed)
Pt refuses to leave and states, "There is no way you can d/c me, I can't walk". Pt walked with no incident with tech.

## 2014-10-22 ENCOUNTER — Telehealth: Payer: Self-pay | Admitting: Internal Medicine

## 2014-10-29 ENCOUNTER — Other Ambulatory Visit: Payer: Self-pay | Admitting: Family

## 2014-10-29 ENCOUNTER — Ambulatory Visit (HOSPITAL_COMMUNITY)
Admission: RE | Admit: 2014-10-29 | Discharge: 2014-10-29 | Disposition: A | Discharge status: Home / Self Care | Payer: Medicaid Other | Source: Ambulatory Visit | Attending: Surgery | Admitting: Surgery

## 2014-10-29 DIAGNOSIS — Z95828 Presence of other vascular implants and grafts: Secondary | ICD-10-CM

## 2014-10-29 DIAGNOSIS — Z87891 Personal history of nicotine dependence: Secondary | ICD-10-CM | POA: Insufficient documentation

## 2014-10-29 DIAGNOSIS — M792 Neuralgia and neuritis, unspecified: Secondary | ICD-10-CM

## 2014-10-29 DIAGNOSIS — G5792 Unspecified mononeuropathy of left lower limb: Secondary | ICD-10-CM | POA: Diagnosis not present

## 2014-10-29 DIAGNOSIS — Z89611 Acquired absence of right leg above knee: Secondary | ICD-10-CM | POA: Diagnosis not present

## 2014-10-29 DIAGNOSIS — I10 Essential (primary) hypertension: Secondary | ICD-10-CM | POA: Diagnosis not present

## 2014-10-29 DIAGNOSIS — E1151 Type 2 diabetes mellitus with diabetic peripheral angiopathy without gangrene: Secondary | ICD-10-CM | POA: Diagnosis not present

## 2014-10-29 DIAGNOSIS — E1142 Type 2 diabetes mellitus with diabetic polyneuropathy: Secondary | ICD-10-CM

## 2014-10-29 DIAGNOSIS — E785 Hyperlipidemia, unspecified: Secondary | ICD-10-CM | POA: Insufficient documentation

## 2014-10-31 ENCOUNTER — Encounter: Payer: Self-pay | Admitting: Family

## 2014-11-02 ENCOUNTER — Ambulatory Visit (INDEPENDENT_AMBULATORY_CARE_PROVIDER_SITE_OTHER): Payer: Medicaid Other | Admitting: Vascular Surgery

## 2014-11-02 ENCOUNTER — Encounter (HOSPITAL_COMMUNITY): Payer: Medicaid Other

## 2014-11-02 ENCOUNTER — Encounter: Payer: Self-pay | Admitting: Vascular Surgery

## 2014-11-02 VITALS — BP 180/100 | HR 85 | Temp 98.1°F | Resp 16 | Ht 72.0 in | Wt 234.0 lb

## 2014-11-02 DIAGNOSIS — I70219 Atherosclerosis of native arteries of extremities with intermittent claudication, unspecified extremity: Secondary | ICD-10-CM

## 2014-11-02 DIAGNOSIS — E108 Type 1 diabetes mellitus with unspecified complications: Secondary | ICD-10-CM | POA: Insufficient documentation

## 2014-11-02 DIAGNOSIS — I739 Peripheral vascular disease, unspecified: Secondary | ICD-10-CM | POA: Diagnosis not present

## 2014-11-02 NOTE — Progress Notes (Signed)
Established Critical Limb Ischemia Patient  History of Present Illness  Javier Quinn is a 54 y.o. ([birth year]) male who presents with chief complaint: neuropathic pain in plantar surface of L foot.  The patient has no rest pain and no wounds.  He has a R AKA prosthesis but his ambulation is limited by his L foot pain with ambulation, described as pins and needles with some severe burning in his great toe.  The patient's treatment regimen currently included: maximal medical management.   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  The patient's PMH, PSH, SH, and FamHx are unchanged from 07/20/14.  Current Outpatient Prescriptions  Medication Sig Dispense Refill  . diltiazem (CARDIZEM CD) 240 MG 24 hr capsule Take 240 mg by mouth daily.    . furosemide (LASIX) 20 MG tablet Take 20 mg by mouth daily.    . Insulin Glargine (TOUJEO SOLOSTAR) 300 UNIT/ML SOPN Inject 88 Units into the skin at bedtime.     . insulin lispro (HUMALOG) 100 UNIT/ML injection Inject 7 Units into the skin 3 (three) times daily with meals.     . latanoprost (XALATAN) 0.005 % ophthalmic solution Place 1 drop into both eyes every morning.     . metFORMIN (GLUCOPHAGE-XR) 500 MG 24 hr tablet Take 1,000 mg by mouth 2 (two) times daily.     [name] acetaminophen (TYLENOL) 325 MG tablet Take 650 mg by mouth daily as needed for moderate pain.    [name] albuterol (PROVENTIL HFA;VENTOLIN HFA) 108 (90 BASE) MCG/ACT inhaler Inhale 2 puffs into the lungs every 4 (four) hours as needed for wheezing. (Patient not taking: Reported on 11/02/2014) 1 each 0  . atorvastatin (LIPITOR) 40 MG tablet Take 40 mg by mouth daily.    . clopidogrel (PLAVIX) 75 MG tablet Take 1 tablet (75 mg total) by mouth daily with breakfast. (Patient not taking: Reported on 11/02/2014) 30 tablet 6  . diltiazem (CARDIZEM CD) 120 MG 24 hr capsule Take 240 mg by mouth daily.    . divalproex (DEPAKOTE ER) 250 MG 24 hr tablet  Take 750 mg by mouth at bedtime.    . divalproex (DEPAKOTE) 250 MG DR tablet Take 500 mg by mouth at bedtime.     . docusate sodium (COLACE) 100 MG capsule Take 100 mg by mouth 2 (two) times daily.     . furosemide (LASIX) 20 MG tablet Take 20 mg by mouth daily.    [name] gabapentin (NEURONTIN) 300 MG capsule Take 1 capsule (300 mg total) by mouth 3 (three) times daily. (Patient not taking: Reported on 11/02/2014) 90 capsule 11  . HYDROcodone-acetaminophen (NORCO) 10-325 MG per tablet Take one tablet by mouth every 4 hours as needed for pain. Max of 6 per day. Do not exceed 3gm of Tylenol in 24 hours (Patient not taking: Reported on 11/02/2014) 180 tablet 0  . HYDROcodone-acetaminophen (NORCO) 5-325 MG per tablet Take one tablet by mouth every 6 hours as needed for mild to moderate pain. Do not exceed 4gm of APAP in 24 hours (Patient not taking: Reported on 11/02/2014) 120 tablet 0  . lisinopril (PRINIVIL,ZESTRIL) 40 MG tablet Take 40 mg by mouth daily.    [name] lisinopril (PRINIVIL,ZESTRIL) 40 MG tablet Take 40 mg by mouth daily.    . metFORMIN (GLUCOPHAGE) 1000 MG tablet Take 1,000 mg by mouth 2 (two) times daily with a meal.    . senna (SENOKOT) 8.6 MG TABS tablet  Take 1 tablet (8.6 mg total) by mouth daily. (Patient not taking: Reported on 11/02/2014) 120 each 0  . traMADol (ULTRAM) 50 MG tablet Take one tablet by mouth every 6 hours as needed for mild to moderate pain; Take two tablets by mouth every 6 hours as needed for severe pain (Patient not taking: Reported on 11/02/2014) 240 tablet 5   No current facility-administered medications for this visit.    Allergies  Allergen Reactions  . Codeine Other (See Comments)    Causes constipation    On ROS today: +neuropathy L foot, poor diabetes control   Physical Examination  Filed Vitals:   11/02/14 1114 11/02/14 1118  BP: 179/100 180/100  Pulse: 83 85  Temp: 98.1 F (36.7 C)   TempSrc: Oral   Resp: 16   Height: 6' (1.829 m)   Weight: 234  lb (106.142 kg)   SpO2: 98%    Body mass index is 31.73 kg/(m^2).  General: A&O x 3, WDWN  Eyes: PERRLA, EOMI  Pulmonary: Sym exp, good air movt, CTAB, no rales, rhonchi, & wheezing  Cardiac: RRR, Nl S1, S2, no Murmurs, rubs or gallops  Vascular: Vessel Right Left  Radial Palpable Palpable  Brachial Palpable Palpable  Carotid Palpable, without bruit Palpable, without bruit  Aorta Not palpable N/A  Femoral Palpable Palpable  Popliteal Not palpable Not palpable  PT Not Palpable Not Palpable  DP Not Palpable Not Palpable   Gastrointestinal: soft, NTND, no G/R, no HSM, no masses, no CVAT B  Musculoskeletal: M/S 5/5 throughout , Extremities without ischemic changes except R AKA, L foot mildly swollen no ulcers or gangrene  Neurologic: Pain and light touch intact in extremities except L foot with minimal sensation, Motor exam as listed above  Non-Invasive Vascular Imaging L ABI (Date: 11/02/2014)  L: 0.69 (0.69), DP: mono, PT: mono, TBI: 0.63   Medical Decision Making  Javier Quinn is a 54 y.o. male who presents with: LLE critical limb ischemia with no wounds, DM with neuropathy and PAD   Based on the patient's vascular studies and examination, I have offered the patient: q3 month ABI.  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  Unfortunately, his diabetic neuropathy is severe enough to limit his ambulation..  I discussed in depth with the patient the nature of atherosclerosis, and emphasized the importance of maximal medical management including strict control of blood pressure, blood glucose, and lipid levels, antiplatelet agents, obtaining regular exercise, and cessation of smoking.    The patient is aware that without maximal medical management the underlying atherosclerotic disease process will progress, limiting the benefit of any  interventions. The patient is currently on a statin: Lipitor. The patient is currently on an anti-platelet: Plavix.  Thank you for allowing [place] to participate in this patient's care.   Adele Barthel, MD [facility] Office: [phone] Pager: [phone]  11/02/2014, 1:00 PM

## 2014-11-02 NOTE — Addendum Note (Signed)
Addended by: Dorthula Rue L on: 11/02/2014 03:19 PM   Modules accepted: Orders

## 2014-11-16 ENCOUNTER — Ambulatory Visit: Payer: Medicaid Other | Attending: Family Medicine | Admitting: Family Medicine

## 2014-11-16 ENCOUNTER — Encounter: Payer: Self-pay | Admitting: Family Medicine

## 2014-11-16 VITALS — BP 158/102 | HR 80 | Temp 97.7°F | Resp 16 | Ht 72.0 in | Wt 240.0 lb

## 2014-11-16 DIAGNOSIS — E1151 Type 2 diabetes mellitus with diabetic peripheral angiopathy without gangrene: Secondary | ICD-10-CM

## 2014-11-16 DIAGNOSIS — R200 Anesthesia of skin: Secondary | ICD-10-CM | POA: Diagnosis not present

## 2014-11-16 DIAGNOSIS — E1140 Type 2 diabetes mellitus with diabetic neuropathy, unspecified: Secondary | ICD-10-CM | POA: Diagnosis not present

## 2014-11-16 DIAGNOSIS — I10 Essential (primary) hypertension: Secondary | ICD-10-CM | POA: Diagnosis not present

## 2014-11-16 DIAGNOSIS — IMO0002 Reserved for concepts with insufficient information to code with codable children: Secondary | ICD-10-CM

## 2014-11-16 DIAGNOSIS — M7989 Other specified soft tissue disorders: Secondary | ICD-10-CM | POA: Diagnosis not present

## 2014-11-16 DIAGNOSIS — G546 Phantom limb syndrome with pain: Secondary | ICD-10-CM | POA: Diagnosis not present

## 2014-11-16 DIAGNOSIS — E1165 Type 2 diabetes mellitus with hyperglycemia: Secondary | ICD-10-CM | POA: Diagnosis not present

## 2014-11-16 HISTORY — DX: Type 2 diabetes mellitus with diabetic peripheral angiopathy without gangrene: E11.51

## 2014-11-16 LAB — GLUCOSE, POCT (MANUAL RESULT ENTRY): POC GLUCOSE: 142 mg/dL — AB (ref 70–99)

## 2014-11-16 LAB — POCT GLYCOSYLATED HEMOGLOBIN (HGB A1C): Hemoglobin A1C: 7.5

## 2014-11-16 MED ORDER — FUROSEMIDE 40 MG PO TABS: 40.0000 mg | ORAL_TABLET | Freq: Every day | ORAL | Status: DC

## 2014-11-16 MED ORDER — CLONIDINE HCL 0.1 MG PO TABS
0.1000 mg | ORAL_TABLET | Freq: Once | ORAL | Status: AC
  Administered 2014-11-16: 0.1 mg via ORAL

## 2014-11-16 MED ORDER — GABAPENTIN 300 MG PO CAPS: 300.0000 mg | ORAL_CAPSULE | Freq: Every day | ORAL | Status: DC

## 2014-11-16 NOTE — Assessment & Plan Note (Signed)
Increase lasix to 40 once daily with goal of BP reduction and improvement in LLE edema

## 2014-11-16 NOTE — Progress Notes (Signed)
Establish care  DM stated taking medication as prescribed  Glucose running 119-600 fasting  No tobacco user  No suicide thought in the past two weeks

## 2014-11-16 NOTE — Progress Notes (Signed)
Subjective:  Patient ID: Javier Quinn, male    DOB: [birth year]  Age: 54 y.o. MRN: [id number]  CC: Establish Care and Diabetes   HPI Javier Quinn presents for   1. CHRONIC DIABETES  Disease Monitoring  Blood Sugar Ranges: 119-600 (ate donuts)   Polyuria: yes, on lasix    Visual problems: no   Medication Compliance: yes  Medication Side Effects  Hypoglycemia: no   Preventitive Health Care  Eye Exam: done recently   Foot Exam: done today   Diet pattern: low carb  Exercise: minimal   2. CHRONIC HYPERTENSION  Disease Monitoring  Blood pressure range: not checking   Chest pain: no   Dyspnea: yes   Claudication: yes   Medication compliance: yes  Medication Side Effects  Lightheadedness: no   Urinary frequency: yes   Edema: yes, L leg      Social History  Substance Use Topics  . Smoking status: Former Smoker -- 0.50 packs/day for 20 years    Types: Cigarettes    Quit date: 04/11/2012  . Smokeless tobacco: Former Systems developer  . Alcohol Use: No     Comment: occ   Outpatient Prescriptions Prior to Visit  Medication Sig Dispense Refill  . acetaminophen (TYLENOL) 325 MG tablet Take 650 mg by mouth daily as needed for moderate pain.    [name] albuterol (PROVENTIL HFA;VENTOLIN HFA) 108 (90 BASE) MCG/ACT inhaler Inhale 2 puffs into the lungs every 4 (four) hours as needed for wheezing. (Patient not taking: Reported on 11/02/2014) 1 each 0  . atorvastatin (LIPITOR) 40 MG tablet Take 40 mg by mouth daily.    . clopidogrel (PLAVIX) 75 MG tablet Take 1 tablet (75 mg total) by mouth daily with breakfast. (Patient not taking: Reported on 11/02/2014) 30 tablet 6  . diltiazem (CARDIZEM CD) 120 MG 24 hr capsule Take 240 mg by mouth daily.    [name] diltiazem (CARDIZEM CD) 240 MG 24 hr capsule Take 240 mg by mouth daily.    . divalproex (DEPAKOTE ER) 250 MG 24 hr tablet Take 750 mg by mouth at bedtime.    . divalproex (DEPAKOTE) 250 MG DR tablet Take 500 mg by mouth at bedtime.     . docusate  sodium (COLACE) 100 MG capsule Take 100 mg by mouth 2 (two) times daily.     . furosemide (LASIX) 20 MG tablet Take 20 mg by mouth daily.    . furosemide (LASIX) 20 MG tablet Take 20 mg by mouth daily.    [name] gabapentin (NEURONTIN) 300 MG capsule Take 1 capsule (300 mg total) by mouth 3 (three) times daily. (Patient not taking: Reported on 11/02/2014) 90 capsule 11  . HYDROcodone-acetaminophen (NORCO) 10-325 MG per tablet Take one tablet by mouth every 4 hours as needed for pain. Max of 6 per day. Do not exceed 3gm of Tylenol in 24 hours (Patient not taking: Reported on 11/02/2014) 180 tablet 0  . HYDROcodone-acetaminophen (NORCO) 5-325 MG per tablet Take one tablet by mouth every 6 hours as needed for mild to moderate pain. Do not exceed 4gm of APAP in 24 hours (Patient not taking: Reported on 11/02/2014) 120 tablet 0  . Insulin Glargine (TOUJEO SOLOSTAR) 300 UNIT/ML SOPN Inject 88 Units into the skin at bedtime.     . insulin lispro (HUMALOG) 100 UNIT/ML injection Inject 7 Units into the skin 3 (three) times daily with meals.     . latanoprost (XALATAN) 0.005 % ophthalmic solution Place 1 drop into both eyes  every morning.     [name] lisinopril (PRINIVIL,ZESTRIL) 40 MG tablet Take 40 mg by mouth daily.    [name] lisinopril (PRINIVIL,ZESTRIL) 40 MG tablet Take 40 mg by mouth daily.    . metFORMIN (GLUCOPHAGE) 1000 MG tablet Take 1,000 mg by mouth 2 (two) times daily with a meal.    . metFORMIN (GLUCOPHAGE-XR) 500 MG 24 hr tablet Take 1,000 mg by mouth 2 (two) times daily.     [name] senna (SENOKOT) 8.6 MG TABS tablet Take 1 tablet (8.6 mg total) by mouth daily. (Patient not taking: Reported on 11/02/2014) 120 each 0  . traMADol (ULTRAM) 50 MG tablet Take one tablet by mouth every 6 hours as needed for mild to moderate pain; Take two tablets by mouth every 6 hours as needed for severe pain (Patient not taking: Reported on 11/02/2014) 240 tablet 5   No facility-administered medications prior to visit.     ROS Review of Systems  Constitutional: Negative for fever, chills, fatigue and unexpected weight change.  Eyes: Negative for visual disturbance.  Respiratory: Positive for shortness of breath. Negative for cough.   Cardiovascular: Positive for leg swelling. Negative for chest pain and palpitations.  Gastrointestinal: Negative for nausea, vomiting, abdominal pain, diarrhea, constipation and blood in stool.  Endocrine: Negative for polydipsia, polyphagia and polyuria.  Musculoskeletal: Negative for myalgias, back pain, arthralgias, gait problem and neck pain.  Skin: Negative for rash.  Allergic/Immunologic: Negative for immunocompromised state.  Neurological: Positive for numbness.  Hematological: Negative for adenopathy. Does not bruise/bleed easily.  Psychiatric/Behavioral: Negative for suicidal ideas, sleep disturbance and dysphoric mood. The patient is not nervous/anxious.     Objective:  BP 160/111 mmHg  Pulse 84  Temp(Src) 97.7 F (36.5 C) (Oral)  Resp 16  Ht 6' (1.829 m)  Wt 240 lb (108.863 kg)  BMI 32.54 kg/m2  SpO2 99%  BP/Weight 11/16/2014 11/02/2014 123456  Systolic BP 0000000 [id number]  Diastolic BP 99991111 [id number] 76  Wt. (Lbs) 240 234 240  BMI 32.54 31.73 32.54  Some encounter information is confidential and restricted. Go to Review Flowsheets activity to see all data.    Physical Exam  Constitutional: He appears well-developed and well-nourished. No distress.  HENT:  Head: Normocephalic and atraumatic.  Neck: Normal range of motion. Neck supple.  Cardiovascular: Normal rate, regular rhythm, normal heart sounds and intact distal pulses.   Pulmonary/Chest: Effort normal and breath sounds normal.  Musculoskeletal: He exhibits no edema.       Legs: Neurological: He is alert.  Skin: Skin is warm and dry. No rash noted. No erythema.  Psychiatric: He has a normal mood and affect.    Lab Results  Component Value Date   HGBA1C 8.3* 11/06/2013   Lab Results   Component Value Date   HGBA1C 7.50 11/16/2014    CBG 142  Assessment & Plan:   Problem List Items Addressed This Visit    Essential hypertension (Chronic)   Relevant Medications   cloNIDine (CATAPRES) tablet 0.1 mg (Completed)   furosemide (LASIX) 40 MG tablet   Left leg swelling (Chronic)   Relevant Medications   furosemide (LASIX) 40 MG tablet   Numbness-  Left foot   Phantom limb pain (HCC)   Relevant Medications   gabapentin (NEURONTIN) 300 MG capsule   Type 2 diabetes, controlled, with neuropathy (HCC) - Primary (Chronic)   Relevant Orders   Microalbumin/Creatinine Ratio, Urine   RESOLVED: Type 2 diabetes, uncontrolled, with peripheral circulatory disorder (HCC)   Relevant Medications  cloNIDine (CATAPRES) tablet 0.1 mg (Completed)   furosemide (LASIX) 40 MG tablet   Other Relevant Orders   POCT glycosylated hemoglobin (Hb A1C) (Completed)   POCT glucose (manual entry) (Completed)      No orders of the defined types were placed in this encounter.    Follow-up: No Follow-up on file.   Boykin Nearing MD

## 2014-11-16 NOTE — Patient Instructions (Signed)
Javier Quinn was seen today for establish care and diabetes.  Diagnoses and all orders for this visit:  Type 2 diabetes, controlled, with neuropathy (HCC) -     Microalbumin/Creatinine Ratio, Urine  Type 2 diabetes, uncontrolled, with peripheral circulatory disorder (HCC) -     POCT glycosylated hemoglobin (Hb A1C) -     POCT glucose (manual entry)  Essential hypertension -     cloNIDine (CATAPRES) tablet 0.1 mg; Take 1 tablet (0.1 mg total) by mouth once. -     furosemide (LASIX) 40 MG tablet; Take 1 tablet (40 mg total) by mouth daily.  Left leg swelling -     furosemide (LASIX) 40 MG tablet; Take 1 tablet (40 mg total) by mouth daily.  Numbness-  Left foot  Phantom limb pain (HCC) -     gabapentin (NEURONTIN) 300 MG capsule; Take 1 capsule (300 mg total) by mouth at bedtime.   F/u in 3 weeks for pharmacy BP check, bring all medications F/u with me in 2 months for HTN  Dr. Adrian Blackwater

## 2014-11-17 LAB — MICROALBUMIN / CREATININE URINE RATIO
CREATININE, URINE: 161 mg/dL (ref 20–370)
MICROALB UR: 1.3 mg/dL
Microalb Creat Ratio: 8 mcg/mg creat (ref <30)

## 2014-11-19 NOTE — Telephone Encounter (Signed)
Pt. Came in to drop off a letter to PCP regarding a surgery that pt. Is going to have. [facility] would like medical clearance for pt. Please f/u.

## 2014-11-26 ENCOUNTER — Telehealth: Payer: Self-pay | Admitting: Internal Medicine

## 2014-11-26 ENCOUNTER — Other Ambulatory Visit: Payer: Self-pay | Admitting: *Deleted

## 2014-11-26 MED ORDER — TRUEPLUS LANCETS 28G MISC: 1.0000 | Freq: Three times a day (TID) | Status: AC

## 2014-11-26 MED ORDER — GLUCOSE BLOOD VI STRP: ORAL_STRIP | Status: DC

## 2014-11-26 MED ORDER — TRUE METRIX METER W/DEVICE KIT: 1.0000 | PACK | Freq: Three times a day (TID) | Status: DC

## 2014-11-26 NOTE — Telephone Encounter (Signed)
Patient has been using his sisters test strips and was never prescribed test strips here and is needing them because he no longer has access to them. Please follow up with patient. Thank you.

## 2014-11-26 NOTE — Telephone Encounter (Signed)
Glucometer, strips and lancets send to [facility] pharmacy  Pt aware

## 2014-11-26 NOTE — Telephone Encounter (Signed)
Patient cleared from a medical standpoint but is instructed to speak with his cardiologist for cardiac clearance and for recommendations regarding whether to continue or hold plavix.

## 2014-11-26 NOTE — Telephone Encounter (Signed)
   Expand All Collapse All   Patient cleared from a medical standpoint, letter in [facility], but is instructed to speak with his cardiologist, Dr. Johnsie Cancel,  for cardiac clearance and for recommendations regarding whether to continue or hold plavix

## 2014-12-01 IMAGING — CR DG LUMBAR SPINE 2-3V
3 series · 3 of 3 positions shown · non-contrast
Comparison: None.

CLINICAL DATA: Chronic upper lumbar and bilateral hip pain.  No
recent injury.

LUMBAR SPINE - 2-3 VIEW

[t l-spine a.p.]
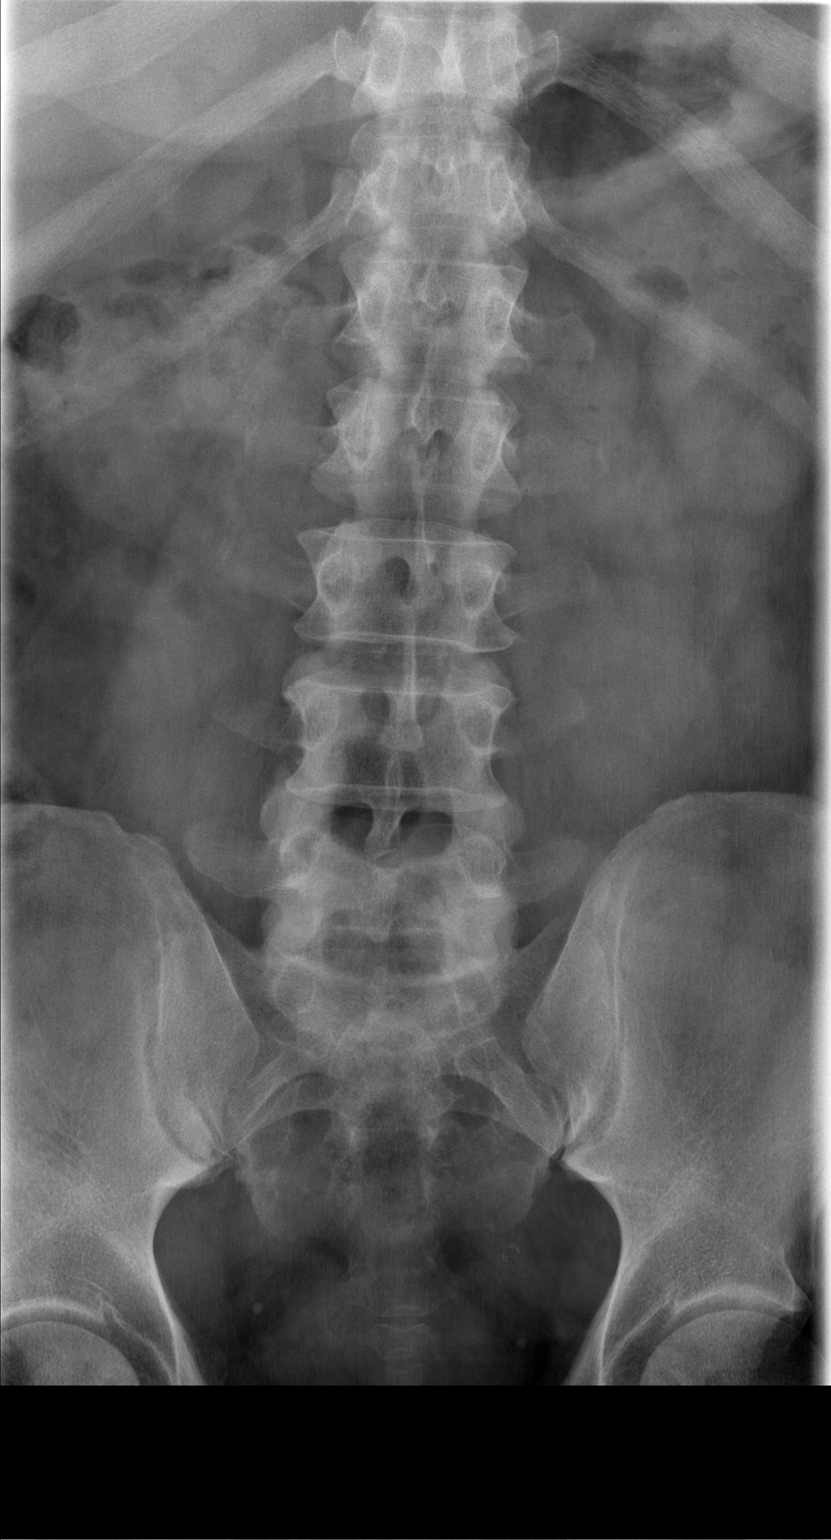

[t l-spine lat]
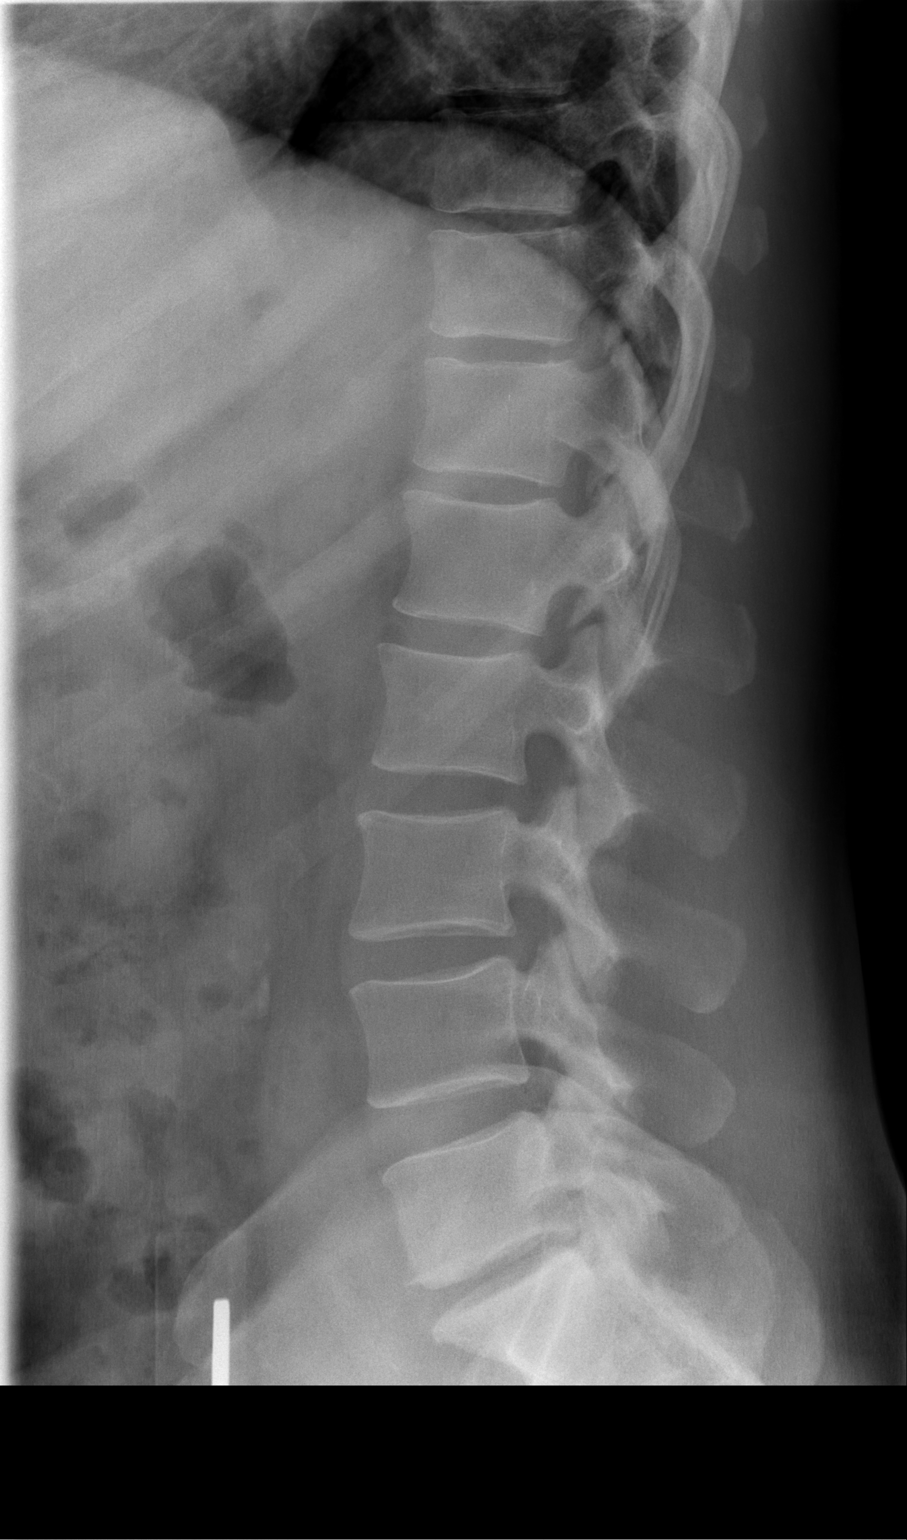

[t l-spine l5-s1 spot]
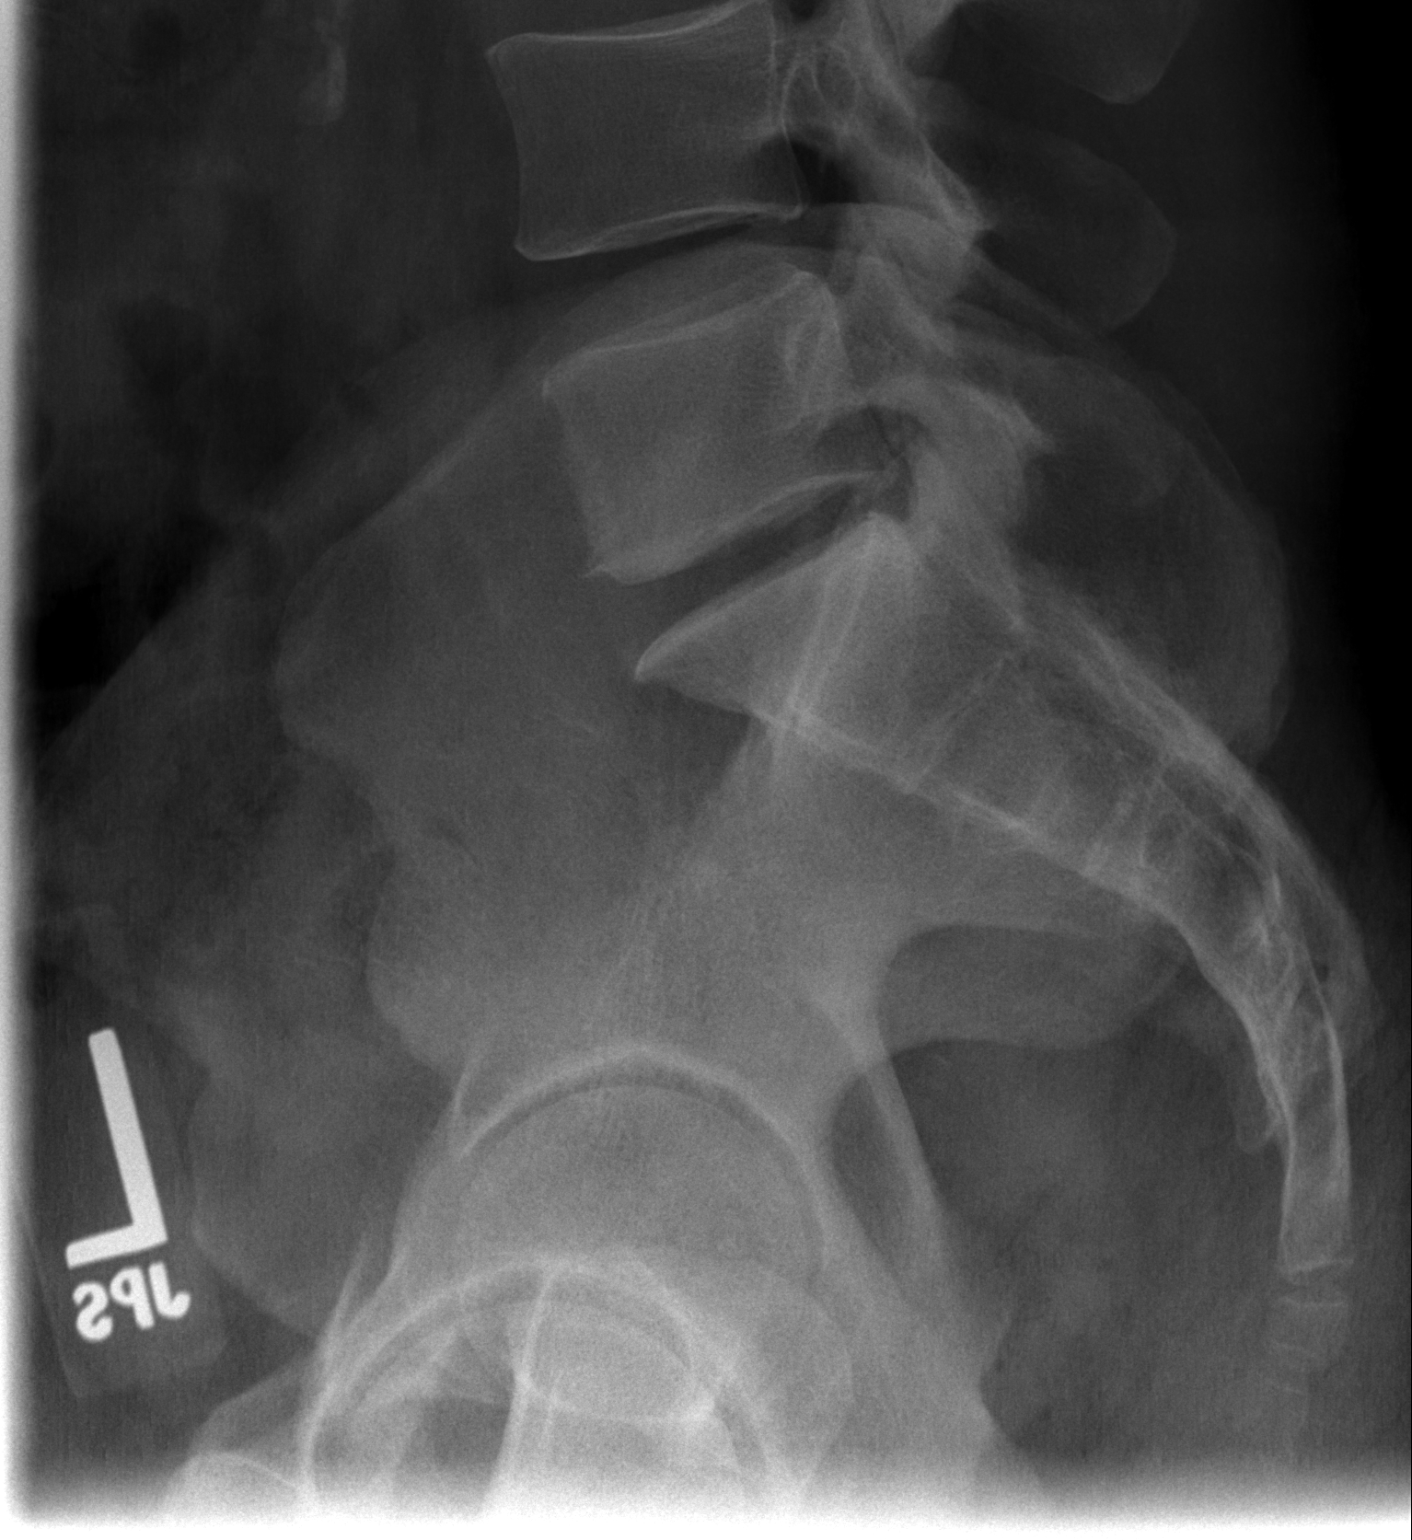

[3 of 3 positions shown; findings below may reference images not displayed]

FINDINGS: There are five lumbar type vertebral bodies.  The
alignment is normal.  There is moderate disc space loss at L5-S1.
The additional disc spaces are preserved.  There is no evidence of
fracture or pars defect.  Early atherosclerosis of the aorta is
noted.
IMPRESSION: Disc space loss at L5-S1.  No acute osseous findings or
malalignment.

## 2014-12-03 NOTE — Telephone Encounter (Signed)
Called Pt notified letter was send Advised  to schedule Cardiologist for clearance  Pt verbalized understanding

## 2014-12-05 ENCOUNTER — Encounter: Payer: Medicaid Other | Admitting: Pharmacist

## 2014-12-12 ENCOUNTER — Ambulatory Visit: Payer: Medicaid Other | Attending: Family Medicine | Admitting: Pharmacist

## 2014-12-12 VITALS — BP 131/89 | HR 76

## 2014-12-12 DIAGNOSIS — I10 Essential (primary) hypertension: Secondary | ICD-10-CM

## 2014-12-12 LAB — GLUCOSE, POCT (MANUAL RESULT ENTRY): POC Glucose: 173 mg/dl — AB (ref 70–99)

## 2014-12-12 MED ORDER — DIVALPROEX SODIUM ER 250 MG PO TB24: 750.0000 mg | ORAL_TABLET | Freq: Every day | ORAL | Status: DC

## 2014-12-12 NOTE — Progress Notes (Signed)
S:    Patient arrives in good spirits, using a wheelchair. Presents to the clinic for hypertension evaluation.   Patient reports adherence with medications.  Current BP Medications include:  Furosemide 40 mg daily, lisinopril 40 mg daily  Patient brings with him all of his medications. He reports that he has not been taking the "pen" (Toujeo) because he doesn't know what it is for. He has been taking all of his other medications but he is almost out of divalproex (1 pill left). He doesn't know that he needs to still take it.   O:   Last 3 Office BP readings: BP Readings from Last 3 Encounters:  12/12/14 131/89  11/16/14 158/102  11/02/14 180/100    BMET    Component Value Date/Time   NA 139 10/04/2014 1036   NA 138 11/06/2013   K 4.3 10/04/2014 1036   CL 106 10/04/2014 1036   CO2 24 10/04/2014 1036   GLUCOSE 129* 10/04/2014 1036   BUN 22* 10/04/2014 1036   BUN 9 11/06/2013   CREATININE 1.10 10/04/2014 1036   CREATININE 0.8 11/06/2013   CALCIUM 9.6 10/04/2014 1036   GFRNONAA >60 10/04/2014 1036   GFRAA >60 10/04/2014 1036   POCT glucose = 173  A/P: History of hypertension currently controlled on current medications.  Continue lisinopril 40 mg daily and furosemide 40 mg daily. Instructed patient to refill his medications on time and to not miss any doses.   Medication reconciliation: patient is not taking his Toujeo since he left the nursing home. His blood sugars have ranged from 100s - 600s per his report. Today it is 173 in clinic. Patient will not take Toujeo until he follows up with Dr. Adrian Blackwater. He will bring in his meter and blood glucose log book at that time. He is almost out of divalproex and has no refills. All of the rest of his medications are present and have refills. Discussed with Dr. Adrian Blackwater, and will obtain valproic acid level and send in script for divalproex. Patient with follow up with Dr. Adrian Blackwater to taper off of divalproex.   Results reviewed and written  information provided. Total time in face-to-face counseling 35 minutes.  F/U Clinic Visit with Dr. Adrian Blackwater.

## 2014-12-12 NOTE — Patient Instructions (Addendum)
Thanks for coming to see me today!  Keep taking all of your medications except the Toujeo pen  Come back and see Dr. Adrian Blackwater to discuss the mood drug (divalproex) and bring your meter or log book!

## 2014-12-13 LAB — VALPROIC ACID LEVEL

## 2014-12-14 ENCOUNTER — Telehealth: Payer: Self-pay | Admitting: *Deleted

## 2014-12-14 NOTE — Telephone Encounter (Signed)
-----   Message from Boykin Nearing, MD sent at 12/13/2014  9:21 AM EST ----- depakote level low Will plan to taper to off at f/u

## 2014-12-14 NOTE — Telephone Encounter (Signed)
Date of birth verified by pt  Lab results given F/U appointment on 01/04/2015 Pt verbalized understanding

## 2015-01-02 ENCOUNTER — Other Ambulatory Visit (HOSPITAL_COMMUNITY): Payer: Self-pay | Admitting: Specialist

## 2015-01-04 ENCOUNTER — Encounter: Payer: Self-pay | Admitting: Family Medicine

## 2015-01-04 ENCOUNTER — Ambulatory Visit: Payer: Medicaid Other | Attending: Family Medicine | Admitting: Family Medicine

## 2015-01-04 VITALS — BP 136/82 | HR 83 | Temp 97.6°F | Resp 20 | Ht 72.0 in | Wt 248.6 lb

## 2015-01-04 DIAGNOSIS — N529 Male erectile dysfunction, unspecified: Secondary | ICD-10-CM | POA: Diagnosis not present

## 2015-01-04 DIAGNOSIS — E1140 Type 2 diabetes mellitus with diabetic neuropathy, unspecified: Secondary | ICD-10-CM | POA: Insufficient documentation

## 2015-01-04 DIAGNOSIS — Z114 Encounter for screening for human immunodeficiency virus [HIV]: Secondary | ICD-10-CM

## 2015-01-04 DIAGNOSIS — Z7984 Long term (current) use of oral hypoglycemic drugs: Secondary | ICD-10-CM | POA: Insufficient documentation

## 2015-01-04 DIAGNOSIS — Z794 Long term (current) use of insulin: Secondary | ICD-10-CM | POA: Diagnosis not present

## 2015-01-04 DIAGNOSIS — Z1159 Encounter for screening for other viral diseases: Secondary | ICD-10-CM | POA: Insufficient documentation

## 2015-01-04 DIAGNOSIS — Z79899 Other long term (current) drug therapy: Secondary | ICD-10-CM | POA: Diagnosis not present

## 2015-01-04 DIAGNOSIS — I10 Essential (primary) hypertension: Secondary | ICD-10-CM | POA: Insufficient documentation

## 2015-01-04 DIAGNOSIS — H409 Unspecified glaucoma: Secondary | ICD-10-CM | POA: Diagnosis not present

## 2015-01-04 DIAGNOSIS — M25511 Pain in right shoulder: Secondary | ICD-10-CM | POA: Insufficient documentation

## 2015-01-04 DIAGNOSIS — Z87891 Personal history of nicotine dependence: Secondary | ICD-10-CM | POA: Insufficient documentation

## 2015-01-04 LAB — BASIC METABOLIC PANEL
BUN: 15 mg/dL (ref 7–25)
CHLORIDE: 101 mmol/L (ref 98–110)
CO2: 23 mmol/L (ref 20–31)
CREATININE: 0.95 mg/dL (ref 0.70–1.33)
Calcium: 10 mg/dL (ref 8.6–10.3)
GLUCOSE: 193 mg/dL — AB (ref 65–99)
Potassium: 4.5 mmol/L (ref 3.5–5.3)
Sodium: 136 mmol/L (ref 135–146)

## 2015-01-04 LAB — GLUCOSE, POCT (MANUAL RESULT ENTRY): POC Glucose: 206 mg/dl — AB (ref 70–99)

## 2015-01-04 MED ORDER — LATANOPROST 0.005 % OP SOLN: 1.0000 [drp] | Freq: Every day | OPHTHALMIC | Status: AC

## 2015-01-04 MED ORDER — METFORMIN HCL ER (OSM) 1000 MG PO TB24: 2000.0000 mg | ORAL_TABLET | Freq: Every day | ORAL | Status: DC

## 2015-01-04 MED ORDER — SILDENAFIL CITRATE 100 MG PO TABS: 50.0000 mg | ORAL_TABLET | Freq: Every day | ORAL | Status: AC | PRN

## 2015-01-04 NOTE — Patient Instructions (Addendum)
Vito was seen today for follow-up.  Diagnoses and all orders for this visit:  Type 2 diabetes, controlled, with neuropathy (HCC) -     Glucose (CBG) -     Cancel: Microalbumin/Creatinine Ratio, Urine -     metformin (FORTAMET) 1000 MG (OSM) 24 hr tablet; Take 2 tablets (2,000 mg total) by mouth daily with breakfast.  Glaucoma -     latanoprost (XALATAN) 0.005 % ophthalmic solution; Place 1 drop into both eyes daily. -     Ambulatory referral to Ophthalmology  Erectile dysfunction, unspecified erectile dysfunction type -     sildenafil (VIAGRA) 100 MG tablet; Take 0.5-1 tablets (50-100 mg total) by mouth daily as needed for erectile dysfunction.  Essential hypertension -     Basic Metabolic Panel  Screening for HIV (human immunodeficiency virus) -     HIV antibody (with reflex)  Need for hepatitis C screening test -     Hepatitis C antibody, reflex   F/u in 2 months for diabetes and HTN   Dr. Adrian Blackwater

## 2015-01-04 NOTE — Assessment & Plan Note (Signed)
A; elevated CBGs P: Decrease carbs Increase metformin to 2000 mg daily

## 2015-01-04 NOTE — Assessment & Plan Note (Signed)
ED viagra ordered

## 2015-01-04 NOTE — Assessment & Plan Note (Signed)
A; BP at goal on recheck Med: compliant P:  Continue current regimen

## 2015-01-04 NOTE — Progress Notes (Addendum)
Subjective:  Patient ID: Javier Quinn, male    DOB: 1960-05-17  Age: 54 y.o. MRN: [id number]  CC: Follow-up   HPI JAXTIN RAIMONDO presents for    1. Discuss depakote: patient was taking up to 750 mg of depakote nightly while in the nursing home. He was taking it presumably as a sleep aid and mood stabilizer. He has no history of seizures. He ha not taking depakote for 3 months. He denies labile mood and sleep disturbance.  2. HTN: taking all meds. Taking lasix every other day. Having swelling in LLE. No HA, CP or SOB.  3. DM2: taking all meds. Gaining weight in abdomen. CBGs in 200s at home.   4. Glaucoma: requesting refill of xalatan drops. Dr. Katy Fitch is his ophthalmologist. He last saw him 6 months ago.   5. R shoulder pain: surgery is coming up. Wants to know when to stop plavix. He has pre-op labs next week.   6. ED: sex life is going down. Trouble getting erection. Has HTN and diabetes. Compliant with all meds.   Social History  Substance Use Topics  . Smoking status: Former Smoker -- 0.50 packs/day for 20 years    Types: Cigarettes    Quit date: 04/11/2012  . Smokeless tobacco: Former Systems developer  . Alcohol Use: No     Comment: occ    Outpatient Prescriptions Prior to Visit  Medication Sig Dispense Refill  . acetaminophen (TYLENOL) 500 MG tablet Take 1,000 mg by mouth daily.     [name] albuterol (PROVENTIL HFA;VENTOLIN HFA) 108 (90 BASE) MCG/ACT inhaler Inhale 1-2 puffs into the lungs every 4 (four) hours as needed for wheezing or shortness of breath.    [name] atorvastatin (LIPITOR) 40 MG tablet Take 40 mg by mouth daily.    . Blood Glucose Monitoring Suppl (TRUE METRIX METER) W/DEVICE KIT 1 each by Does not apply route 3 (three) times daily. 1 kit 0  . clopidogrel (PLAVIX) 75 MG tablet Take 1 tablet (75 mg total) by mouth daily with breakfast. 30 tablet 6  . diltiazem (CARDIZEM CD) 240 MG 24 hr capsule Take 240 mg by mouth daily.    [name] docusate sodium (COLACE) 100 MG capsule Take 100  mg by mouth daily.     . furosemide (LASIX) 40 MG tablet Take 1 tablet (40 mg total) by mouth daily. (Patient taking differently: Take 40 mg by mouth daily as needed for fluid or edema (leg swelling). ) 30 tablet 3  . gabapentin (NEURONTIN) 300 MG capsule Take 1 capsule (300 mg total) by mouth at bedtime. (Patient taking differently: Take 300 mg by mouth 3 (three) times daily. ) 30 capsule 5  . glucose blood (TRUE METRIX BLOOD GLUCOSE TEST) test strip Use as instructed 100 each 12  . insulin lispro (HUMALOG) 100 UNIT/ML injection Inject 15 Units into the skin 3 (three) times daily with meals.     . latanoprost (XALATAN) 0.005 % ophthalmic solution Place 1 drop into both eyes daily.     [name] lisinopril (PRINIVIL,ZESTRIL) 40 MG tablet Take 40 mg by mouth daily.    . metformin (FORTAMET) 1000 MG (OSM) 24 hr tablet Take 1,000 mg by mouth daily with breakfast.    . TRUEPLUS LANCETS 28G MISC 1 each by Does not apply route 3 (three) times daily. 100 each 11   No facility-administered medications prior to visit.    ROS Review of Systems  Constitutional: Negative for fever, chills, fatigue and unexpected weight change.  Eyes:  Negative for visual disturbance.  Respiratory: Negative for cough and shortness of breath.   Cardiovascular: Positive for leg swelling. Negative for chest pain and palpitations.  Gastrointestinal: Negative for nausea, vomiting, abdominal pain, diarrhea, constipation and blood in stool.  Endocrine: Negative for polydipsia, polyphagia and polyuria.  Musculoskeletal: Negative for myalgias, back pain, arthralgias, gait problem and neck pain.  Skin: Negative for rash.  Allergic/Immunologic: Negative for immunocompromised state.  Hematological: Negative for adenopathy. Does not bruise/bleed easily.  Psychiatric/Behavioral: Negative for suicidal ideas, sleep disturbance and dysphoric mood. The patient is not nervous/anxious.     Objective:  BP 136/82 mmHg  Pulse 83  Temp(Src) 97.6  F (36.4 C) (Oral)  Resp 20  Ht 6' (1.829 m)  Wt 248 lb 9.6 oz (112.764 kg)  BMI 33.71 kg/m2  SpO2 96%  BP/Weight 01/04/2015 12/12/2014 05/39/7673  Systolic BP 419 379 024  Diastolic BP 82 89 097  Wt. (Lbs) 248.6 - 240  BMI 33.71 - 32.54  Some encounter information is confidential and restricted. Go to Review Flowsheets activity to see all data.    Physical Exam  Constitutional: He appears well-developed and well-nourished. No distress.  HENT:  Head: Normocephalic and atraumatic.  Neck: Normal range of motion. Neck supple.  Cardiovascular: Normal rate, regular rhythm, normal heart sounds and intact distal pulses.   Pulmonary/Chest: Effort normal and breath sounds normal.  Musculoskeletal: He exhibits no edema.  Neurological: He is alert.  Skin: Skin is warm and dry. No rash noted. No erythema.  Psychiatric: He has a normal mood and affect.   Depression screen [facility] 2/9 01/04/2015 11/16/2014 11/16/2014 08/15/2012 08/10/2012  Decreased Interest 0 0 0 0 0  Down, Depressed, Hopeless 0 0 0 0 0  PHQ - 2 Score 0 0 0 0 0    Lab Results  Component Value Date   HGBA1C 7.50 11/16/2014   CBG 206  Assessment & Plan:   Problem List Items Addressed This Visit    Essential hypertension (Chronic)   Relevant Medications   sildenafil (VIAGRA) 100 MG tablet   Other Relevant Orders   Basic Metabolic Panel   Glaucoma (Chronic)   Relevant Medications   latanoprost (XALATAN) 0.005 % ophthalmic solution   Other Relevant Orders   Ambulatory referral to Ophthalmology   Type 2 diabetes, controlled, with neuropathy (HCC) - Primary (Chronic)   Relevant Medications   metformin (FORTAMET) 1000 MG (OSM) 24 hr tablet   Other Relevant Orders   Glucose (CBG) (Completed)    Other Visit Diagnoses    Erectile dysfunction, unspecified erectile dysfunction type        Relevant Medications    sildenafil (VIAGRA) 100 MG tablet    Screening for HIV (human immunodeficiency virus)        Relevant Orders      HIV antibody (with reflex)    Need for hepatitis C screening test        Relevant Orders    Hepatitis C antibody, reflex       No orders of the defined types were placed in this encounter.    Follow-up: No Follow-up on file.   Boykin Nearing MD

## 2015-01-04 NOTE — Progress Notes (Signed)
Patient here to discuss mood drug with provider.  Patient states he is here for a FU. Patient admits to not taking Depakote for months.  Patient denies pain at this time.  Patient is requesting a refill on Opthalmic solution.

## 2015-01-04 NOTE — Assessment & Plan Note (Signed)
Refilled xalatan Opthalmology referral placed

## 2015-01-05 LAB — HIV ANTIBODY (ROUTINE TESTING W REFLEX): HIV 1&2 Ab, 4th Generation: NONREACTIVE

## 2015-01-05 LAB — HEPATITIS C ANTIBODY: HCV AB: NEGATIVE

## 2015-01-08 ENCOUNTER — Telehealth: Payer: Self-pay

## 2015-01-08 ENCOUNTER — Inpatient Hospital Stay (HOSPITAL_COMMUNITY)
Admission: RE | Admit: 2015-01-08 | Discharge: 2015-01-08 | Disposition: A | Discharge status: Home / Self Care | Payer: Medicaid Other | Source: Ambulatory Visit

## 2015-01-08 NOTE — Telephone Encounter (Signed)
Spoke with patient and he is aware of his lab results 

## 2015-01-08 NOTE — Telephone Encounter (Signed)
-----   Message from Boykin Nearing, MD sent at 01/08/2015 12:05 PM EST ----- Screening HIV and Hep C negative Normal BMP Continue current care plan

## 2015-01-09 ENCOUNTER — Encounter (HOSPITAL_COMMUNITY): Payer: Self-pay

## 2015-01-09 ENCOUNTER — Encounter (HOSPITAL_COMMUNITY)
Admission: RE | Admit: 2015-01-09 | Discharge: 2015-01-09 | Disposition: A | Discharge status: Home / Self Care | Payer: Medicaid Other | Source: Ambulatory Visit | Attending: Specialist | Admitting: Specialist

## 2015-01-09 DIAGNOSIS — I10 Essential (primary) hypertension: Secondary | ICD-10-CM | POA: Diagnosis not present

## 2015-01-09 DIAGNOSIS — E119 Type 2 diabetes mellitus without complications: Secondary | ICD-10-CM | POA: Diagnosis not present

## 2015-01-09 DIAGNOSIS — Z7902 Long term (current) use of antithrombotics/antiplatelets: Secondary | ICD-10-CM | POA: Diagnosis not present

## 2015-01-09 DIAGNOSIS — Z79899 Other long term (current) drug therapy: Secondary | ICD-10-CM | POA: Insufficient documentation

## 2015-01-09 DIAGNOSIS — K219 Gastro-esophageal reflux disease without esophagitis: Secondary | ICD-10-CM | POA: Insufficient documentation

## 2015-01-09 DIAGNOSIS — Z89611 Acquired absence of right leg above knee: Secondary | ICD-10-CM | POA: Insufficient documentation

## 2015-01-09 DIAGNOSIS — Z87891 Personal history of nicotine dependence: Secondary | ICD-10-CM | POA: Insufficient documentation

## 2015-01-09 DIAGNOSIS — Z01812 Encounter for preprocedural laboratory examination: Secondary | ICD-10-CM | POA: Diagnosis not present

## 2015-01-09 DIAGNOSIS — E785 Hyperlipidemia, unspecified: Secondary | ICD-10-CM | POA: Insufficient documentation

## 2015-01-09 DIAGNOSIS — Z01818 Encounter for other preprocedural examination: Secondary | ICD-10-CM | POA: Insufficient documentation

## 2015-01-09 DIAGNOSIS — Z794 Long term (current) use of insulin: Secondary | ICD-10-CM | POA: Insufficient documentation

## 2015-01-09 LAB — COMPREHENSIVE METABOLIC PANEL
ALBUMIN: 3.9 g/dL (ref 3.5–5.0)
ALK PHOS: 87 U/L (ref 38–126)
ALT: 37 U/L (ref 17–63)
ANION GAP: 10 (ref 5–15)
AST: 24 U/L (ref 15–41)
BUN: 13 mg/dL (ref 6–20)
CALCIUM: 9.6 mg/dL (ref 8.9–10.3)
CO2: 23 mmol/L (ref 22–32)
Chloride: 105 mmol/L (ref 101–111)
Creatinine, Ser: 0.98 mg/dL (ref 0.61–1.24)
GFR calc non Af Amer: >60 mL/min (ref >60)
GLUCOSE: 157 mg/dL — AB (ref 65–99)
POTASSIUM: 4.4 mmol/L (ref 3.5–5.1)
SODIUM: 138 mmol/L (ref 135–145)
TOTAL PROTEIN: 6.7 g/dL (ref 6.5–8.1)
Total Bilirubin: 0.3 mg/dL (ref 0.3–1.2)

## 2015-01-09 LAB — GLUCOSE, CAPILLARY: GLUCOSE-CAPILLARY: 151 mg/dL — AB (ref 65–99)

## 2015-01-09 LAB — CBC
HCT: 43.1 % (ref 39.0–52.0)
HEMOGLOBIN: 14.8 g/dL (ref 13.0–17.0)
MCH: 28.1 pg (ref 26.0–34.0)
MCHC: 34.3 g/dL (ref 30.0–36.0)
MCV: 81.9 fL (ref 78.0–100.0)
Platelets: 221 10*3/uL (ref 150–400)
RBC: 5.26 MIL/uL (ref 4.22–5.81)
RDW: 13.6 % (ref 11.5–15.5)
WBC: 7.1 10*3/uL (ref 4.0–10.5)

## 2015-01-09 LAB — SURGICAL PCR SCREEN
MRSA, PCR: NEGATIVE
Staphylococcus aureus: NEGATIVE

## 2015-01-09 NOTE — Pre-Procedure Instructions (Addendum)
Javier Quinn  01/09/2015      [facility] [address] [place], [place] - [address] [place] [address] Phone: [phone] Fax: [phone]    Your procedure is scheduled on Jan 6th.  Report to [facility] Admitting at [phone].  Call this number if you have problems the morning of surgery:  [phone]   Remember:  Do not eat food or drink liquids after midnight.  Take these medicines the morning of surgery with A SIP OF WATER Tylenol if needed, Albuterol inhaler if needed (Bring with you on the daty oof surgery), Diltiazem (Cardizem), gabapentin (Neurontin)  Stop Plavix 5 days before your surgery. How to Manage Your Diabetes Before Surgery   Why is it important to control my blood sugar before and after surgery?   Improving blood sugar levels before and after surgery helps healing and can limit problems.  A way of improving blood sugar control is eating a healthy diet by:  - Eating less sugar and carbohydrates  - Increasing activity/exercise  - Talk with your doctor about reaching your blood sugar goals  High blood sugars (greater than 180 mg/dL) can raise your risk of infections and slow down your recovery so you will need to focus on controlling your diabetes during the weeks before surgery.  Make sure that the doctor who takes care of your diabetes knows about your planned surgery including the date and location.  How do I manage my blood sugars before surgery?   Check your blood sugar at least 4 times a day, 2 days before surgery to make sure that they are not too high or low.   Check your blood sugar the morning of your surgery when you wake up and every 2               hours until you get to the Short-Stay unit.  If your blood sugar is less than 70 mg/dL, you will need to treat for low blood sugar by:  Treat a low blood sugar (less than 70 mg/dL) with 1/2 cup of clear juice (cranberry or apple), 4 glucose  tablets, OR glucose gel.  Recheck blood sugar in 15 minutes after treatment (to make sure it is greater than 70 mg/dL).  If blood sugar is not greater than 70 mg/dL on re-check, call [phone] for further instructions.   Report your blood sugar to the Short-Stay nurse when you get to Short-Stay.  References:  [facility], 2007 "How to Manage your Diabetes Before and After Surgery".  What do I do about my diabetes medications?  Do not take oral diabetes medicines (pills) the morning of surgery Metformin    Do not take other diabetes injectables the day of surgery including Byetta, Victoza, Bydureon, and Trulicity.    If your CBG is greater than 220 mg/dL, you may take 1/2 of your sliding scale (correction) dose of insulin.   For patients with "Insulin Pumps":  Contact your diabetes doctor for specific instructions before surgery.   Decrease basal insulin rates by 20% at midnight the night before surgery.  Note that if your surgery is planned to be longer than 2 hours, your insulin pump will be removed and intravenous (IV) insulin will be started and managed by the nurses and anesthesiologist.  You will be able to restart your insulin pump once you are awake and able to manage it.  Make sure to bring insulin pump supplies to  the hospital with you in case your site needs to be changed.          Do not wear jewelry, make-up or nail polish.  Do not wear lotions, powders, or perfumes.  You may wear deodorant.  Do not shave 48 hours prior to surgery.  Men may shave face and neck.  Do not bring valuables to the hospital.  [facility] is not responsible for any belongings or valuables.  Contacts, dentures or bridgework may not be worn into surgery.  Leave your suitcase in the car.  After surgery it may be brought to your room.  For patients admitted to the hospital, discharge time will be determined by your treatment team.  Patients discharged the  day of surgery will not be allowed to drive home.    Special instructions: [place] - Preparing for Surgery  Before surgery, you can play an important role.  Because skin is not sterile, your skin needs to be as free of germs as possible.  You can reduce the number of germs on you skin by washing with CHG (chlorahexidine gluconate) soap before surgery.  CHG is an antiseptic cleaner which kills germs and bonds with the skin to continue killing germs even after washing.  Please DO NOT use if you have an allergy to CHG or antibacterial soaps.  If your skin becomes reddened/irritated stop using the CHG and inform your nurse when you arrive at Short Stay.  Do not shave (including legs and underarms) for at least 48 hours prior to the first CHG shower.  You may shave your face.  Please follow these instructions carefully:   1.  Shower with CHG Soap the night before surgery and the    morning of Surgery.  2.  If you choose to wash your hair, wash your hair first as usual with your  normal shampoo.  3.  After you shampoo, rinse your hair and body thoroughly to remove the  Shampoo.  4.  Use CHG as you would any other liquid soap.  You can apply chg directly   to the skin and wash gently with scrungie or a clean washcloth.  5.  Apply the CHG Soap to your body ONLY FROM THE NECK DOWN.        Do not use on open wounds or open sores.  Avoid contact with your eyes,  ears, mouth and genitals (private parts).  Wash genitals (private parts)       with your normal soap.  6.  Wash thoroughly, paying special attention to the area where your surgery    will be performed.  7.  Thoroughly rinse your body with warm water from the neck down.  8.  DO NOT shower/wash with your normal soap after using and rinsing off   the CHG Soap.  9.  Pat yourself dry with a clean towel.            10.  Wear clean pajamas.            11.  Place clean sheets on your bed the night of your first shower and do not        sleep with  pets.  Day of Surgery  Do not apply any lotions/deoderants the morning of surgery.  Please wear clean clothes to the hospital/surgery center.     Please read over the following fact sheets that you were given. Pain Booklet, Coughing and Deep Breathing, MRSA Information and Surgical Site Infection Prevention

## 2015-01-09 NOTE — Progress Notes (Addendum)
PCP is Dr, Boykin Nearing Cardiologist is Dr Johnsie Cancel Pt reports his fasting CBG's run 186-266 (this am was 166) Pt reports his last Plavix was 01-08-2015 Medical and cardiac clearance noted in chart.

## 2015-01-10 ENCOUNTER — Encounter (HOSPITAL_COMMUNITY): Payer: Self-pay | Admitting: Vascular Surgery

## 2015-01-10 LAB — HEMOGLOBIN A1C
HEMOGLOBIN A1C: 9.4 % — AB (ref 4.8–5.6)
MEAN PLASMA GLUCOSE: 223 mg/dL

## 2015-01-10 NOTE — Progress Notes (Signed)
Anesthesia chart review: Patient is a 55 year old male scheduled for right C4-5 foraminotomy on 01/11/15 by Dr. Louanne Skye.  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 Bridgett Larsson), hyperlipidemia, GERD, hiatal hernia.BMI 32.   PCP is Dr. Boykin Nearing who medically cleared patient for surgery.   Cardiologist is Dr. Johnsie Cancel who gave cardiac clearance for this procedure with permission to hold Plavix for 5 days preoperatively. PAT RN notes indicate that his last Plavix dose was 01/08/15 which will only be three days (not five) from his currently scheduled OR date. Sherrie at Dr. Letta Moynahan office notified that patient reported that his last Plavix dose was 01/08/15.   Meds include Plavix, albuterol, Lipitor, diltiazem, Xalatan ophthalmic, Lasix, Neurontin, Humalog, lisinopril, metformin, Viagra.   05/26/12 Nuclear stress test: Probable normal perfusion and mild soft tissue attenuation.  LV Ejection Fraction: 50%. LV Wall Motion: Overall normal wall thickening. LVEF appears greater than calculated. By notes, Dr. Johnsie Cancel felt it was a "Normal myovue study with no evidence of ischemia or infarction."  10/04/14 EKG: SR, borderline T wave abnormalities, diffuse leads. Baseline wanderer in I, II, aVR, V1. Possible inferior infarct (old).   10/04/14 Chest x-ray: IMPRESSION: No active cardiopulmonary disease.  Preoperative labs noted. Cr 0.98. A1c 9.4 (up from 7.5 on 01/15/14), consistent with mean plasma glucose 223. He reported a fasting glucose yesterday of 166, but reports his range is from 186-266. A1c and reported CBG range reported to [facility] at Dr. Otho Ket office. If fasting CBG significantly elevated on the day of surgery, his procedure could be delayed or even canceled. He will get a fasting CBG on arrival.  Sherrie with review last Plavix dose and poorly controlled DM with Dr. Louanne Skye to determine if he wants to proceed as  planned from a surgical standpoint.  [facility] Short Stay Center/Anesthesiology Phone [phone] 01/10/2015 11:21 AM

## 2015-01-11 ENCOUNTER — Encounter (HOSPITAL_COMMUNITY): Admission: RE | Payer: Self-pay | Source: Ambulatory Visit

## 2015-01-11 ENCOUNTER — Ambulatory Visit (HOSPITAL_COMMUNITY): Admission: RE | Admit: 2015-01-11 | Payer: Medicaid Other | Source: Ambulatory Visit | Admitting: Specialist

## 2015-01-11 SURGERY — POSTERIOR CERVICAL FUSION/FORAMINOTOMY LEVEL 1
Anesthesia: General

## 2015-01-18 ENCOUNTER — Ambulatory Visit: Payer: Medicaid Other | Attending: Family Medicine | Admitting: Family Medicine

## 2015-01-18 ENCOUNTER — Encounter: Payer: Self-pay | Admitting: Family Medicine

## 2015-01-18 VITALS — BP 139/77 | HR 93 | Temp 97.8°F | Resp 16

## 2015-01-18 DIAGNOSIS — IMO0002 Reserved for concepts with insufficient information to code with codable children: Secondary | ICD-10-CM

## 2015-01-18 DIAGNOSIS — Z6832 Body mass index (BMI) 32.0-32.9, adult: Secondary | ICD-10-CM | POA: Insufficient documentation

## 2015-01-18 DIAGNOSIS — Z87891 Personal history of nicotine dependence: Secondary | ICD-10-CM | POA: Insufficient documentation

## 2015-01-18 DIAGNOSIS — E1159 Type 2 diabetes mellitus with other circulatory complications: Secondary | ICD-10-CM | POA: Diagnosis not present

## 2015-01-18 DIAGNOSIS — Z7984 Long term (current) use of oral hypoglycemic drugs: Secondary | ICD-10-CM | POA: Diagnosis not present

## 2015-01-18 DIAGNOSIS — G546 Phantom limb syndrome with pain: Secondary | ICD-10-CM

## 2015-01-18 DIAGNOSIS — E1165 Type 2 diabetes mellitus with hyperglycemia: Secondary | ICD-10-CM | POA: Diagnosis present

## 2015-01-18 DIAGNOSIS — Z79899 Other long term (current) drug therapy: Secondary | ICD-10-CM | POA: Insufficient documentation

## 2015-01-18 DIAGNOSIS — Z794 Long term (current) use of insulin: Secondary | ICD-10-CM | POA: Insufficient documentation

## 2015-01-18 LAB — GLUCOSE, POCT (MANUAL RESULT ENTRY): POC GLUCOSE: 213 mg/dL — AB (ref 70–99)

## 2015-01-18 MED ORDER — GABAPENTIN 300 MG PO CAPS: 300.0000 mg | ORAL_CAPSULE | Freq: Three times a day (TID) | ORAL | Status: AC

## 2015-01-18 MED ORDER — GLIMEPIRIDE 2 MG PO TABS: 2.0000 mg | ORAL_TABLET | Freq: Every day | ORAL | Status: DC

## 2015-01-18 NOTE — Assessment & Plan Note (Signed)
A: diabetes has declined unsure cause Med: compliant P: Continue metformin 1000 mg BID Continue humalog 15 U TID  Add amaryl 2 mg with breakfast with plan to increase to 4 mg in one week

## 2015-01-18 NOTE — Progress Notes (Signed)
F/U DM, clearance for surgery Pain scale #5  No tobacco user  No suicidal thought in the past two week

## 2015-01-18 NOTE — Progress Notes (Signed)
Patient ID: Javier Quinn, male   DOB: 10-25-1960, 55 y.o.   MRN: [id number]   Subjective:  Patient ID: Javier Quinn, male    DOB: [birth year]  Age: 55 y.o. MRN: [id number]  CC: Diabetes   HPI ISSACC MERLO presents for    1. CHRONIC DIABETES  Disease Monitoring  Blood Sugar Ranges: 150 up to 300, mostly in 200s   Polyuria: no   Visual problems: no   Medication Compliance: yes  Medication Side Effects  Hypoglycemia: no   Patient was unable to have neck surgery due to elevated A1c. He denies fever, chills, rash, wounds. Increased stress or dietary non-compliance.   Social History  Substance Use Topics  . Smoking status: Former Smoker -- 0.50 packs/day for 20 years    Types: Cigarettes    Quit date: 04/11/2012  . Smokeless tobacco: Former Systems developer  . Alcohol Use: No     Comment: occ    Outpatient Prescriptions Prior to Visit  Medication Sig Dispense Refill  . acetaminophen (TYLENOL) 500 MG tablet Take 1,000 mg by mouth daily.     [name] albuterol (PROVENTIL HFA;VENTOLIN HFA) 108 (90 BASE) MCG/ACT inhaler Inhale 1-2 puffs into the lungs every 4 (four) hours as needed for wheezing or shortness of breath.    [name] atorvastatin (LIPITOR) 40 MG tablet Take 40 mg by mouth daily.    . Blood Glucose Monitoring Suppl (TRUE METRIX METER) W/DEVICE KIT 1 each by Does not apply route 3 (three) times daily. 1 kit 0  . clopidogrel (PLAVIX) 75 MG tablet Take 1 tablet (75 mg total) by mouth daily with breakfast. 30 tablet 6  . diltiazem (CARDIZEM CD) 240 MG 24 hr capsule Take 240 mg by mouth daily.    [name] docusate sodium (COLACE) 100 MG capsule Take 100 mg by mouth daily.     . furosemide (LASIX) 40 MG tablet Take 1 tablet (40 mg total) by mouth daily. (Patient taking differently: Take 40 mg by mouth daily as needed for fluid or edema (leg swelling). ) 30 tablet 3  . gabapentin (NEURONTIN) 300 MG capsule Take 1 capsule (300 mg total) by mouth at bedtime. (Patient taking differently: Take 300 mg by mouth 3  (three) times daily. ) 30 capsule 5  . glucose blood (TRUE METRIX BLOOD GLUCOSE TEST) test strip Use as instructed 100 each 12  . insulin lispro (HUMALOG) 100 UNIT/ML injection Inject 15 Units into the skin 3 (three) times daily with meals.     . latanoprost (XALATAN) 0.005 % ophthalmic solution Place 1 drop into both eyes daily. 2.5 mL 0  . lisinopril (PRINIVIL,ZESTRIL) 40 MG tablet Take 40 mg by mouth daily.    . metformin (FORTAMET) 1000 MG (OSM) 24 hr tablet Take 2 tablets (2,000 mg total) by mouth daily with breakfast. 60 tablet 5  . sildenafil (VIAGRA) 100 MG tablet Take 0.5-1 tablets (50-100 mg total) by mouth daily as needed for erectile dysfunction. 5 tablet 11  . TRUEPLUS LANCETS 28G MISC 1 each by Does not apply route 3 (three) times daily. 100 each 11   No facility-administered medications prior to visit.    ROS Review of Systems  Constitutional: Negative for fever, chills, fatigue and unexpected weight change.  Eyes: Negative for visual disturbance.  Respiratory: Negative for cough and shortness of breath.   Cardiovascular: Negative for chest pain, palpitations and leg swelling.  Gastrointestinal: Negative for nausea, vomiting, abdominal pain, diarrhea, constipation and blood in stool.  Endocrine: Negative for polydipsia,  polyphagia and polyuria.  Musculoskeletal: Negative for myalgias, back pain, arthralgias, gait problem and neck pain.  Skin: Negative for rash.  Allergic/Immunologic: Negative for immunocompromised state.  Hematological: Negative for adenopathy. Does not bruise/bleed easily.  Psychiatric/Behavioral: Negative for suicidal ideas, sleep disturbance and dysphoric mood. The patient is not nervous/anxious.     Objective:  BP 139/77 mmHg  Pulse 93  Temp(Src) 97.8 F (36.6 C) (Oral)  Resp 16  SpO2 96%  BP/Weight 01/18/2015 01/09/2015 19/37/9024  Systolic BP 097 353 299  Diastolic BP 77 90 82  Wt. (Lbs) - 239 248.6  BMI - 32.41 33.71  Some encounter  information is confidential and restricted. Go to Review Flowsheets activity to see all data.   Wt Readings from Last 3 Encounters:  01/09/15 239 lb (108.41 kg)  01/04/15 248 lb 9.6 oz (112.764 kg)  11/16/14 240 lb (108.863 kg)    Physical Exam  Constitutional: He appears well-developed and well-nourished. No distress.  HENT:  Head: Normocephalic and atraumatic.  Neck: Normal range of motion. Neck supple.  Cardiovascular: Normal rate, regular rhythm, normal heart sounds and intact distal pulses.   Pulmonary/Chest: Effort normal and breath sounds normal.  Musculoskeletal: He exhibits no edema.  Neurological: He is alert.  Skin: Skin is warm and dry. No rash noted. No erythema.  Psychiatric: He has a normal mood and affect.   Lab Results  Component Value Date   HGBA1C 9.4* 01/09/2015   CBG 213  EKG: unchanged from previous tracings, nonspecific ST and T waves changes.   Assessment & Plan:   Problem List Items Addressed This Visit    Phantom limb pain ([place])   Relevant Medications   gabapentin (NEURONTIN) 300 MG capsule   Type 2 diabetes mellitus, uncontrolled ([place]) - Primary (Chronic)    A: diabetes has declined unsure cause Med: compliant P: Continue metformin 1000 mg BID Continue humalog 15 U TID  Add amaryl 2 mg with breakfast with plan to increase to 4 mg in one week       Relevant Medications   glimepiride (AMARYL) 2 MG tablet   Other Relevant Orders   POCT glucose (manual entry) (Completed)      No orders of the defined types were placed in this encounter.    Follow-up: No Follow-up on file.   Boykin Nearing MD

## 2015-01-18 NOTE — Patient Instructions (Addendum)
Javier Quinn was seen today for diabetes.  Diagnoses and all orders for this visit:  Uncontrolled type 2 diabetes mellitus with other circulatory complication, with long-term current use of insulin (HCC) -     POCT glucose (manual entry) -     glimepiride (AMARYL) 2 MG tablet; Take 1 tablet (2 mg total) by mouth daily before breakfast. Start 2 mg daily with breakfast, increase to 4 mg daily in one week  Phantom limb pain (HCC) -     gabapentin (NEURONTIN) 300 MG capsule; Take 1 capsule (300 mg total) by mouth 3 (three) times daily.   F.u in 4 weeks for A1c  Dr. Adrian Blackwater

## 2015-01-25 ENCOUNTER — Encounter: Payer: Self-pay | Admitting: Family

## 2015-01-30 LAB — HM DIABETES EYE EXAM

## 2015-02-01 ENCOUNTER — Ambulatory Visit (INDEPENDENT_AMBULATORY_CARE_PROVIDER_SITE_OTHER): Payer: Medicaid Other | Admitting: Family

## 2015-02-01 ENCOUNTER — Encounter: Payer: Self-pay | Admitting: Family

## 2015-02-01 ENCOUNTER — Encounter (HOSPITAL_COMMUNITY): Payer: Medicaid Other

## 2015-02-01 ENCOUNTER — Ambulatory Visit: Payer: Medicaid Other | Admitting: Family

## 2015-02-01 ENCOUNTER — Ambulatory Visit (HOSPITAL_COMMUNITY)
Admission: RE | Admit: 2015-02-01 | Discharge: 2015-02-01 | Disposition: A | Discharge status: Home / Self Care | Payer: Medicaid Other | Source: Ambulatory Visit | Attending: Vascular Surgery | Admitting: Vascular Surgery

## 2015-02-01 VITALS — BP 145/91 | HR 64 | Temp 97.1°F | Resp 16 | Ht 72.0 in | Wt 239.0 lb

## 2015-02-01 DIAGNOSIS — Z89611 Acquired absence of right leg above knee: Secondary | ICD-10-CM | POA: Diagnosis not present

## 2015-02-01 DIAGNOSIS — E119 Type 2 diabetes mellitus without complications: Secondary | ICD-10-CM | POA: Diagnosis not present

## 2015-02-01 DIAGNOSIS — I739 Peripheral vascular disease, unspecified: Secondary | ICD-10-CM | POA: Insufficient documentation

## 2015-02-01 DIAGNOSIS — E1151 Type 2 diabetes mellitus with diabetic peripheral angiopathy without gangrene: Secondary | ICD-10-CM | POA: Diagnosis not present

## 2015-02-01 DIAGNOSIS — E108 Type 1 diabetes mellitus with unspecified complications: Secondary | ICD-10-CM | POA: Diagnosis not present

## 2015-02-01 DIAGNOSIS — I10 Essential (primary) hypertension: Secondary | ICD-10-CM | POA: Diagnosis not present

## 2015-02-01 DIAGNOSIS — I70219 Atherosclerosis of native arteries of extremities with intermittent claudication, unspecified extremity: Secondary | ICD-10-CM

## 2015-02-01 DIAGNOSIS — E785 Hyperlipidemia, unspecified: Secondary | ICD-10-CM | POA: Insufficient documentation

## 2015-02-01 DIAGNOSIS — Z87891 Personal history of nicotine dependence: Secondary | ICD-10-CM

## 2015-02-01 DIAGNOSIS — I70209 Unspecified atherosclerosis of native arteries of extremities, unspecified extremity: Secondary | ICD-10-CM | POA: Diagnosis not present

## 2015-02-01 DIAGNOSIS — E1142 Type 2 diabetes mellitus with diabetic polyneuropathy: Secondary | ICD-10-CM | POA: Diagnosis not present

## 2015-02-01 DIAGNOSIS — S78111A Complete traumatic amputation at level between right hip and knee, initial encounter: Secondary | ICD-10-CM

## 2015-02-01 DIAGNOSIS — Z95828 Presence of other vascular implants and grafts: Secondary | ICD-10-CM

## 2015-02-01 NOTE — Progress Notes (Signed)
Filed Vitals:   02/01/15 1144 02/01/15 1151  BP: 147/91 145/91  Pulse: 66 64  Temp: 97.1 F (36.2 C)   TempSrc: Oral   Resp: 16   Height: 6' (1.829 m)   Weight: 239 lb (108.41 kg)   SpO2: 96%

## 2015-02-01 NOTE — Addendum Note (Signed)
Addended by: Dorthula Rue L on: 02/01/2015 04:12 PM   Modules accepted: Orders

## 2015-02-01 NOTE — Patient Instructions (Signed)
Peripheral Vascular Disease Peripheral vascular disease (PVD) is a disease of the blood vessels that are not part of your heart and brain. A simple term for PVD is poor circulation. In most cases, PVD narrows the blood vessels that carry blood from your heart to the rest of your body. This can result in a decreased supply of blood to your arms, legs, and internal organs, like your stomach or kidneys. However, it most often affects a person's lower legs and feet. There are two types of PVD.  Organic PVD. This is the more common type. It is caused by damage to the structure of blood vessels.  Functional PVD. This is caused by conditions that make blood vessels contract and tighten (spasm). Without treatment, PVD tends to get worse over time. PVD can also lead to acute ischemic limb. This is when an arm or limb suddenly has trouble getting enough blood. This is a medical emergency. CAUSES Each type of PVD has many different causes. The most common cause of PVD is buildup of a fatty material (plaque) inside of your arteries (atherosclerosis). Small amounts of plaque can break off from the walls of the blood vessels and become lodged in a smaller artery. This blocks blood flow and can cause acute ischemic limb. Other common causes of PVD include:  Blood clots that form inside of blood vessels.  Injuries to blood vessels.  Diseases that cause inflammation of blood vessels or cause blood vessel spasms.  Health behaviors and health history that increase your risk of developing PVD. RISK FACTORS  You may have a greater risk of PVD if you:  Have a family history of PVD.  Have certain medical conditions, including:  High cholesterol.  Diabetes.  High blood pressure (hypertension).  Coronary heart disease.  Past problems with blood clots.  Past injury, such as burns or a broken bone. These may have damaged blood vessels in your limbs.  Buerger disease. This is caused by inflamed blood  vessels in your hands and feet.  Some forms of arthritis.  Rare birth defects that affect the arteries in your legs.  Use tobacco.  Do not get enough exercise.  Are obese.  Are age 50 or older. SIGNS AND SYMPTOMS  PVD may cause many different symptoms. Your symptoms depend on what part of your body is not getting enough blood. Some common signs and symptoms include:  Cramps in your lower legs. This may be a symptom of poor leg circulation (claudication).  Pain and weakness in your legs while you are physically active that goes away when you rest (intermittent claudication).  Leg pain when at rest.  Leg numbness, tingling, or weakness.  Coldness in a leg or foot, especially when compared with the other leg.  Skin or hair changes. These can include:  Hair loss.  Shiny skin.  Pale or bluish skin.  Thick toenails.  Inability to get or maintain an erection (erectile dysfunction). People with PVD are more prone to developing ulcers and sores on their toes, feet, or legs. These may take longer than normal to heal. DIAGNOSIS Your health care provider may diagnose PVD from your signs and symptoms. The health care provider will also do a physical exam. You may have tests to find out what is causing your PVD and determine its severity. Tests may include:  Blood pressure recordings from your arms and legs and measurements of the strength of your pulses (pulse volume recordings).  Imaging studies using sound waves to take pictures of   the blood flow through your blood vessels (Doppler ultrasound).  Injecting a dye into your blood vessels before having imaging studies using:  X-rays (angiogram or arteriogram).  Computer-generated X-rays (CT angiogram).  A powerful electromagnetic field and a computer (magnetic resonance angiogram or MRA). TREATMENT Treatment for PVD depends on the cause of your condition and the severity of your symptoms. It also depends on your age. Underlying  causes need to be treated and controlled. These include long-lasting (chronic) conditions, such as diabetes, high cholesterol, and high blood pressure. You may need to first try making lifestyle changes and taking medicines. Surgery may be needed if these do not work. Lifestyle changes may include:  Quitting smoking.  Exercising regularly.  Following a low-fat, low-cholesterol diet. Medicines may include:  Blood thinners to prevent blood clots.  Medicines to improve blood flow.  Medicines to improve your blood cholesterol levels. Surgical procedures may include:  A procedure that uses an inflated balloon to open a blocked artery and improve blood flow (angioplasty).  A procedure to put in a tube (stent) to keep a blocked artery open (stent implant).  Surgery to reroute blood flow around a blocked artery (peripheral bypass surgery).  Surgery to remove dead tissue from an infected wound on the affected limb.  Amputation. This is surgical removal of the affected limb. This may be necessary in cases of acute ischemic limb that are not improved through medical or surgical treatments. HOME CARE INSTRUCTIONS  Take medicines only as directed by your health care provider.  Do not use any tobacco products, including cigarettes, chewing tobacco, or electronic cigarettes. If you need help quitting, ask your health care provider.  Lose weight if you are overweight, and maintain a healthy weight as directed by your health care provider.  Eat a diet that is low in fat and cholesterol. If you need help, ask your health care provider.  Exercise regularly. Ask your health care provider to suggest some good activities for you.  Use compression stockings or other mechanical devices as directed by your health care provider.  Take good care of your feet.  Wear comfortable shoes that fit well.  Check your feet often for any cuts or sores. SEEK MEDICAL CARE IF:  You have cramps in your legs  while walking.  You have leg pain when you are at rest.  You have coldness in a leg or foot.  Your skin changes.  You have erectile dysfunction.  You have cuts or sores on your feet that are not healing. SEEK IMMEDIATE MEDICAL CARE IF:  Your arm or leg turns cold and blue.  Your arms or legs become red, warm, swollen, painful, or numb.  You have chest pain or trouble breathing.  You suddenly have weakness in your face, arm, or leg.  You become very confused or lose the ability to speak.  You suddenly have a very bad headache or lose your vision.   This information is not intended to replace advice given to you by your health care provider. Make sure you discuss any questions you have with your health care provider.   Document Released: 01/30/2004 Document Revised: 01/12/2014 Document Reviewed: 06/01/2013 Elsevier Interactive Patient Education 2016 [employer]  

## 2015-02-01 NOTE — Progress Notes (Signed)
[facility] [place] HISTORY AND PHYSICAL -PAD  History of Present Illness Javier Quinn is a 55 y.o. male patient of Dr. Imogene Burn who presents with chief complaint: follow up for PAD. Previous operation(s) completed include: failed R SFA to peroneal bypass, PTA distal anastomosis, R AKA.  The patient's symptoms are: neuropathic pain in L foot and diffuse numbness if L foot.  The patient's treatment regimen currently includes: maximal medical management, physical therapy has ended. He has a R AKA prosthesis but his ambulation is limited by his L foot pain with ambulation, described as pins and needles with some severe burning in his great toe  He continues to have mild swelling in his left foot/lower leg; swelling resolves overnight with left leg elevation.  The pt denies non healing wounds on his right AKA stump or left lower extremity.  He denies any history of stroke or TIA.  Pt states that his orthopedic doctor determined that his right shoulder to right leg pain is likely from a pinched nerve in his back. Pt states he needs to get his A1C down before Dr. Tanda Rockers can do the surgery he needs on his c-spine.  The plantar surface of his left foot is painful with walking, the more he walks the more painful this becomes. This limits his walking.   Pt smoker: former smoker, quit in 2014  DM : 01/09/15 A1C was 9.4 (average daily glucose of 223) (review of records), previous visit A1C was 8.3, worsening glycemic control, states he started Amaryl about a week ago and is bringing his blood sugar down better; states he will see his PCP, Dr. Armen Pickup, in a couple of weeks  Pt meds include: Statin :Yes, Lipitor Betablocker: No ASA: No Other anticoagulants/antiplatelets: Plavix    Past Medical History  Diagnosis Date  . Diabetes mellitus without complication (HCC)   . Hyperlipidemia   . Tobacco dependence   . GERD (gastroesophageal reflux disease)   . H/O hiatal  hernia   . Peripheral vascular disease (HCC)   . Hypertension     See's Dr Eden Emms States   . PVD (peripheral vascular disease) with claudication (HCC) 05/18/2012  . Atherosclerotic peripheral vascular disease with intermittent claudication (HCC) 05/18/2012  . Unspecified essential hypertension 05/18/2012  . Type II or unspecified type diabetes mellitus with peripheral circulatory disorders, uncontrolled(250.72) 10/11/2012  . Dyslipidemia 08/10/2012  . Above knee amputation of right lower extremity [facility])     Social History Social History  Substance Use Topics  . Smoking status: Former Smoker -- 0.50 packs/day for 20 years    Types: Cigarettes    Quit date: 04/11/2012  . Smokeless tobacco: Former Neurosurgeon  . Alcohol Use: No     Comment: occ    Family History Family History  Problem Relation Age of Onset  . Cancer Mother     pancreas  . Cancer Father   . Heart attack Sister     Past Surgical History  Procedure Laterality Date  . Bypass graft femoral-peroneal Right 06/15/2012    Procedure: BYPASS GRAFT [place];  Surgeon: Fransisco Hertz, MD;  Location: [facility] OR;  Service: Vascular;  Laterality: Right;  using right non-reversed saphenous vein  . Lower extremity angiogram Right 06/15/2012    Procedure: LOWER EXTREMITY ANGIOGRAM;  Surgeon: Fransisco Hertz, MD;  Location: [facility] OR;  Service: Vascular;  Laterality: Right;  . Lower extremity angiogram Right 09/09/2012    Procedure: LOWER EXTREMITY ANGIOGRAM With Agioplasty Bypass Graft;  Surgeon: Ottie Glazier  Bridgett Larsson, MD;  Location: [place];  Service: Vascular;  Laterality: Right;  . Amputation Right 09/28/2012    Procedure: AMPUTATION ABOVE KNEE;  Surgeon: Conrad Stonington, MD;  Location: [place];  Service: Vascular;  Laterality: Right;  . Ganglion cyst excision Right 08/16/2013    Procedure: REMOVAL GANGLION cyst  RIGHT WRIST;  Surgeon: Marianna Payment, MD;  Location: [place];  Service: Orthopedics;  Laterality: Right;  . Abdominal aortagram N/A 05/12/2012     Procedure: ABDOMINAL AORTAGRAM;  Surgeon: Conrad Cloudcroft, MD;  Location: [facility] CATH LAB;  Service: Cardiovascular;  Laterality: N/A;  . Lower extremity angiogram  05/12/2012    Procedure: LOWER EXTREMITY ANGIOGRAM;  Surgeon: Conrad New Hampshire, MD;  Location: [facility] CATH LAB;  Service: Cardiovascular;;  . Colonoscopy      Allergies  Allergen Reactions  . Codeine Nausea And Vomiting    Current Outpatient Prescriptions  Medication Sig Dispense Refill  . acetaminophen (TYLENOL) 500 MG tablet Take 1,000 mg by mouth daily.     [name] albuterol (PROVENTIL HFA;VENTOLIN HFA) 108 (90 BASE) MCG/ACT inhaler Inhale 1-2 puffs into the lungs every 4 (four) hours as needed for wheezing or shortness of breath.    [name] atorvastatin (LIPITOR) 40 MG tablet Take 40 mg by mouth daily.    . Blood Glucose Monitoring Suppl (TRUE METRIX METER) W/DEVICE KIT 1 each by Does not apply route 3 (three) times daily. 1 kit 0  . clopidogrel (PLAVIX) 75 MG tablet Take 1 tablet (75 mg total) by mouth daily with breakfast. 30 tablet 6  . diltiazem (CARDIZEM CD) 240 MG 24 hr capsule Take 240 mg by mouth daily.    [name] docusate sodium (COLACE) 100 MG capsule Take 100 mg by mouth daily.     . furosemide (LASIX) 40 MG tablet Take 1 tablet (40 mg total) by mouth daily. (Patient taking differently: Take 40 mg by mouth daily as needed for fluid or edema (leg swelling). ) 30 tablet 3  . gabapentin (NEURONTIN) 300 MG capsule Take 1 capsule (300 mg total) by mouth 3 (three) times daily. 90 capsule 5  . glimepiride (AMARYL) 2 MG tablet Take 1 tablet (2 mg total) by mouth daily before breakfast. Start 2 mg daily with breakfast, increase to 4 mg daily in one week 60 tablet 3  . glucose blood (TRUE METRIX BLOOD GLUCOSE TEST) test strip Use as instructed 100 each 12  . insulin lispro (HUMALOG) 100 UNIT/ML injection Inject 15 Units into the skin 3 (three) times daily with meals.     . latanoprost (XALATAN) 0.005 % ophthalmic solution Place 1 drop into both eyes daily.  2.5 mL 0  . lisinopril (PRINIVIL,ZESTRIL) 40 MG tablet Take 40 mg by mouth daily.    . metformin (FORTAMET) 1000 MG (OSM) 24 hr tablet Take 2 tablets (2,000 mg total) by mouth daily with breakfast. 60 tablet 5  . sildenafil (VIAGRA) 100 MG tablet Take 0.5-1 tablets (50-100 mg total) by mouth daily as needed for erectile dysfunction. 5 tablet 11  . TRUEPLUS LANCETS 28G MISC 1 each by Does not apply route 3 (three) times daily. 100 each 11   No current facility-administered medications for this visit.    ROS: See HPI for pertinent positives and negatives.   Physical Examination  Filed Vitals:   02/01/15 1144 02/01/15 1151  BP: 147/91 145/91  Pulse: 66 64  Temp: 97.1 F (36.2 C)   TempSrc: Oral   Resp: 16   Height: 6' (1.829 m)  Weight: 239 lb (108.41 kg)   SpO2: 96%    Body mass index is 32.41 kg/(m^2).   General: A&O x 3, WDWN, obese male, seated in his wheelchair  Pulmonary: Sym exp, diminished air movt, CTAB, no rales, rhonchi, & wheezing   Cardiac: RRR, Nl S1, S2, no detected murmur  Vascular:  Vessel  Right  Left   Radial  Palpable  Palpable   Brachial  Palpable  Palpable   Carotid  Palpable, without bruit  Palpable, without bruit   Aorta  Not palpable  N/A   Femoral  Not Palpable, pt sitting in wheelchair and is obese  Not Palpable, pt sitting in wheelchair and is obese  Popliteal  AKA  Not palpable   PT  AKA  Not palpable, not audible with Doppler   DP  AKA  Not palpable, monophasic with Doppler   peroneal AKA Not palpable, monophasic by Doppler   Gastrointestinal: soft, NTND, -G/R, - HSM, - palpable masses, - CVAT B   Musculoskeletal: M/S 5/5 throughout , R AKA with no evidence of wounds or ulcers, left foot viable with some mild dep. rubor, no gangrene or ulcers , no sensation in foot, scant hair growth on left great toe and left 4th toe.trace  non pitting edema in left lower leg and foot.  Neurologic: Pain and light touch intact in extremities except R AKA and L foot, Motor exam as listed above                       Non-Invasive Vascular Imaging: DATE: 02/01/2015 ABI (Date: 02/01/2015)  R: AKA   L: falsely elevated at 0.84  (0.69, 11/02/15), DP: monophasic, PT: monophasic, TBI: 0.40 (0.63)  Today's toe brachial index has decreased compared to 3 months ago, but the ankle brachial index has increased, which may indicate falsely elevated ankle pressure due to calcification of vessels.     ASSESSMENT: SEBERT STOLLINGS is a 55 y.o. male who presents with LLE critical limb ischemia with no wounds, no signs of ischemia in his left LE, DM with neuropathy and PAD. He is s/p failed R SFA to peroneal bypass, PTA distal anastomosis, R AKA.  The ankle brachial index is not reliable due to calcified arteries secondary to hyperglycemic damage. ABI waveforms are monophasic. His left TBI decreased to 0.40 from 0.63 compared to 3 months earlier, most likely due to worsening of his glycemic control as his A1C has increased to 9.4 (on 01/09/15) this visit compared to the last time I saw him  (8.3 A1C). His PCP added another DM medication in the last few days and he states his sugars have improved a great deal.  His left leg arterial perfusion would benefit from more walking, but his walking is limited by severe neuropathic pain in his left foot which is aggravated by walking.  Dr. Bridgett Larsson last evaluated pt on 11/02/14. At that time, based on the patient's vascular studies and examination, Dr. Bridgett Larsson offered the patient: q3 month ABI. 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 Unfortunately, his diabetic neuropathy is severe enough to limit his ambulation.  Pt states he has not heard from a podiatrist; I referred him to a  podiatrist for DM foot care and to help with neuropathic foot pain when I last saw him; will refer again.    PLAN:  Referral to podiatrist that will accept  pt's insurance, for diabetic foot care and to help address pt's neuropathic left foot pain.  Demonstrated and discussed daily right LE exercises. Based on the patient's vascular studies and examination, pt will return to clinic in 3 months with ABI's.  I discussed in depth with the patient the nature of atherosclerosis, and emphasized the importance of maximal medical management including strict control of blood pressure, blood glucose, and lipid levels, obtaining regular exercise, and continued cessation of smoking.  The patient is aware that without maximal medical management the underlying atherosclerotic disease process will progress, limiting the benefit of any interventions.  The patient was given information about PAD including signs, symptoms, treatment, what symptoms should prompt the patient to seek immediate medical care, and risk reduction measures to take.  Clemon Chambers, RN, MSN, FNP-C [facility] Phone: [phone]  Clinic MD: Bridgett Larsson  02/01/2015 11:43 AM

## 2015-02-11 ENCOUNTER — Ambulatory Visit: Payer: Medicaid Other | Admitting: Family Medicine

## 2015-02-18 ENCOUNTER — Encounter: Payer: Self-pay | Admitting: Family Medicine

## 2015-02-18 ENCOUNTER — Ambulatory Visit: Payer: Medicaid Other | Attending: Family Medicine | Admitting: Family Medicine

## 2015-02-18 VITALS — BP 134/79 | HR 76 | Temp 97.8°F | Resp 16 | Ht 72.0 in | Wt 239.0 lb

## 2015-02-18 DIAGNOSIS — Z79899 Other long term (current) drug therapy: Secondary | ICD-10-CM | POA: Diagnosis not present

## 2015-02-18 DIAGNOSIS — Z7984 Long term (current) use of oral hypoglycemic drugs: Secondary | ICD-10-CM | POA: Diagnosis not present

## 2015-02-18 DIAGNOSIS — E1165 Type 2 diabetes mellitus with hyperglycemia: Secondary | ICD-10-CM | POA: Insufficient documentation

## 2015-02-18 DIAGNOSIS — Z794 Long term (current) use of insulin: Secondary | ICD-10-CM

## 2015-02-18 DIAGNOSIS — E1159 Type 2 diabetes mellitus with other circulatory complications: Secondary | ICD-10-CM | POA: Diagnosis not present

## 2015-02-18 DIAGNOSIS — IMO0002 Reserved for concepts with insufficient information to code with codable children: Secondary | ICD-10-CM

## 2015-02-18 DIAGNOSIS — Z87891 Personal history of nicotine dependence: Secondary | ICD-10-CM | POA: Diagnosis not present

## 2015-02-18 LAB — POCT GLYCOSYLATED HEMOGLOBIN (HGB A1C): Hemoglobin A1C: 8.8

## 2015-02-18 MED ORDER — EXENATIDE 5 MCG/0.02ML ~~LOC~~ SOPN: 5.0000 ug | PEN_INJECTOR | Freq: Two times a day (BID) | SUBCUTANEOUS | Status: DC

## 2015-02-18 MED ORDER — ACCU-CHEK AVIVA PLUS W/DEVICE KIT: 1.0000 | PACK | Freq: Three times a day (TID) | Status: DC

## 2015-02-18 MED ORDER — GLUCOSE BLOOD VI STRP: 1.0000 | ORAL_STRIP | Freq: Three times a day (TID) | Status: DC

## 2015-02-18 MED ORDER — INSULIN PEN NEEDLE 31G X 8 MM MISC: 1.0000 "application " | Freq: Two times a day (BID) | Status: DC

## 2015-02-18 NOTE — Progress Notes (Signed)
Patient ID: Javier Quinn, male   DOB: 1960-07-02, 55 y.o.   MRN: [id number]   Subjective:  Patient ID: Javier Quinn, male    DOB: July 03, 1960  Age: 55 y.o. MRN: [id number]  CC: Follow-up   HPI Javier Quinn presents for    1. CHRONIC DIABETES  Disease Monitoring  Blood Sugar Ranges: 150 up to 300, mostly in 200s   Polyuria: no   Visual problems: no   Medication Compliance: yes  Medication Side Effects  Hypoglycemia: no   Patient was unable to have neck surgery due to elevated A1c. Goal A1c for surgery is < 7. He exercises in his wheelchair. He is compliant with low carb diet. He admits to truncal weight gain.   Social History  Substance Use Topics  . Smoking status: Former Smoker -- 0.50 packs/day for 20 years    Types: Cigarettes    Quit date: 04/11/2012  . Smokeless tobacco: Former Systems developer  . Alcohol Use: No     Comment: occ    Outpatient Prescriptions Prior to Visit  Medication Sig Dispense Refill  . acetaminophen (TYLENOL) 500 MG tablet Take 1,000 mg by mouth daily.     [name] albuterol (PROVENTIL HFA;VENTOLIN HFA) 108 (90 BASE) MCG/ACT inhaler Inhale 1-2 puffs into the lungs every 4 (four) hours as needed for wheezing or shortness of breath.    [name] atorvastatin (LIPITOR) 40 MG tablet Take 40 mg by mouth daily.    . Blood Glucose Monitoring Suppl (TRUE METRIX METER) W/DEVICE KIT 1 each by Does not apply route 3 (three) times daily. 1 kit 0  . clopidogrel (PLAVIX) 75 MG tablet Take 1 tablet (75 mg total) by mouth daily with breakfast. 30 tablet 6  . diltiazem (CARDIZEM CD) 240 MG 24 hr capsule Take 240 mg by mouth daily.    . divalproex (DEPAKOTE ER) 250 MG 24 hr tablet   0  . docusate sodium (COLACE) 100 MG capsule Take 100 mg by mouth daily.     . furosemide (LASIX) 40 MG tablet Take 1 tablet (40 mg total) by mouth daily. (Patient taking differently: Take 40 mg by mouth daily as needed for fluid or edema (leg swelling). ) 30 tablet 3  . gabapentin (NEURONTIN) 300 MG capsule  Take 1 capsule (300 mg total) by mouth 3 (three) times daily. 90 capsule 5  . glimepiride (AMARYL) 2 MG tablet Take 1 tablet (2 mg total) by mouth daily before breakfast. Start 2 mg daily with breakfast, increase to 4 mg daily in one week 60 tablet 3  . glucose blood (TRUE METRIX BLOOD GLUCOSE TEST) test strip Use as instructed 100 each 12  . insulin lispro (HUMALOG) 100 UNIT/ML injection Inject 15 Units into the skin 3 (three) times daily with meals.     . latanoprost (XALATAN) 0.005 % ophthalmic solution Place 1 drop into both eyes daily. 2.5 mL 0  . lisinopril (PRINIVIL,ZESTRIL) 40 MG tablet Take 40 mg by mouth daily.    . metformin (FORTAMET) 1000 MG (OSM) 24 hr tablet Take 2 tablets (2,000 mg total) by mouth daily with breakfast. 60 tablet 5  . TRUEPLUS LANCETS 28G MISC 1 each by Does not apply route 3 (three) times daily. 100 each 11  . sildenafil (VIAGRA) 100 MG tablet Take 0.5-1 tablets (50-100 mg total) by mouth daily as needed for erectile dysfunction. (Patient not taking: Reported on 02/18/2015) 5 tablet 11   No facility-administered medications prior to visit.    ROS Review of  Systems  Constitutional: Negative for fever, chills, fatigue and unexpected weight change.  Eyes: Negative for visual disturbance.  Respiratory: Negative for cough and shortness of breath.   Cardiovascular: Negative for chest pain, palpitations and leg swelling.  Gastrointestinal: Negative for nausea, vomiting, abdominal pain, diarrhea, constipation and blood in stool.  Endocrine: Negative for polydipsia, polyphagia and polyuria.  Musculoskeletal: Negative for myalgias, back pain, arthralgias, gait problem and neck pain.  Skin: Negative for rash.  Allergic/Immunologic: Negative for immunocompromised state.  Hematological: Negative for adenopathy. Does not bruise/bleed easily.  Psychiatric/Behavioral: Negative for suicidal ideas, sleep disturbance and dysphoric mood. The patient is not nervous/anxious.      Objective:  BP 134/79 mmHg  Pulse 76  Temp(Src) 97.8 F (36.6 C) (Oral)  Resp 16  Ht 6' (1.829 m)  Wt 239 lb (108.41 kg)  BMI 32.41 kg/m2  SpO2 95%  BP/Weight 02/18/2015 02/01/2015 1/69/4503  Systolic BP 888 280 034  Diastolic BP 79 91 77  Wt. (Lbs) 239 239 -  BMI 32.41 32.41 -   Wt Readings from Last 3 Encounters:  02/18/15 239 lb (108.41 kg)  02/01/15 239 lb (108.41 kg)  01/09/15 239 lb (108.41 kg)    Physical Exam  Constitutional: He appears well-developed and well-nourished. No distress.  HENT:  Head: Normocephalic and atraumatic.  Neck: Normal range of motion. Neck supple.  Cardiovascular: Normal rate, regular rhythm, normal heart sounds and intact distal pulses.   Pulmonary/Chest: Effort normal and breath sounds normal.  Musculoskeletal: He exhibits no edema.  Neurological: He is alert.  Skin: Skin is warm and dry. No rash noted. No erythema.  Psychiatric: He has a normal mood and affect.   Lab Results  Component Value Date   HGBA1C 8.80 02/18/2015   CBG 213   Assessment & Plan:   Problem List Items Addressed This Visit    Type 2 diabetes mellitus, uncontrolled ([place]) - Primary (Chronic)   Relevant Medications   glucose blood (ACCU-CHEK AVIVA PLUS) test strip   Blood Glucose Monitoring Suppl (ACCU-CHEK AVIVA PLUS) w/Device KIT   exenatide (BYETTA 5 MCG PEN) 5 MCG/0.02ML SOPN injection   Insulin Pen Needle (B-D ULTRAFINE III SHORT PEN) 31G X 8 MM MISC   Other Relevant Orders   HgB A1c (Completed)      No orders of the defined types were placed in this encounter.    Follow-up: No Follow-up on file.   Boykin Nearing MD

## 2015-02-18 NOTE — Patient Instructions (Addendum)
Larico was seen today for follow-up.  Diagnoses and all orders for this visit:  Uncontrolled type 2 diabetes mellitus with other circulatory complication, with long-term current use of insulin (HCC) -     HgB A1c -     glucose blood (ACCU-CHEK AVIVA PLUS) test strip; 1 each by Other route 3 (three) times daily. E11.65 -     Blood Glucose Monitoring Suppl (ACCU-CHEK AVIVA PLUS) w/Device KIT; 1 Device by Does not apply route 3 (three) times daily after meals. E11.65 -     exenatide (BYETTA 5 MCG PEN) 5 MCG/0.02ML SOPN injection; Inject 0.02 mLs (5 mcg total) into the skin 2 (two) times daily with a meal.  Diabetes blood sugar goals  Fasting (in AM before breakfast, 8 hrs of no eating or drinking (except water or unsweetened coffee or tea): 90-110 2 hrs after meals: < 160,   No low sugars: nothing < 70   F/u in 2 weeks with clinical pharmacologist for CBG check  F/u with me in 4 weeks for diabetes   Dr. Adrian Blackwater

## 2015-02-19 NOTE — Assessment & Plan Note (Signed)
A: improved A1c but still above goal of < 7. 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

## 2015-02-26 ENCOUNTER — Ambulatory Visit: Payer: Medicaid Other | Admitting: Podiatry

## 2015-02-27 DIAGNOSIS — H6520 Chronic serous otitis media, unspecified ear: Secondary | ICD-10-CM | POA: Insufficient documentation

## 2015-02-27 DIAGNOSIS — Z9889 Other specified postprocedural states: Secondary | ICD-10-CM | POA: Insufficient documentation

## 2015-02-28 ENCOUNTER — Telehealth: Payer: Self-pay | Admitting: Family Medicine

## 2015-02-28 NOTE — Telephone Encounter (Signed)
Pt. Called stating that PCP has to call his pharmacy so that he can be able to get his meter and test strips because he has medicaid. Please f/u with pt.

## 2015-03-01 NOTE — Telephone Encounter (Signed)
Patient called stating he needs a prior authorization for test strips. Please follow up

## 2015-03-05 ENCOUNTER — Ambulatory Visit: Payer: Medicaid Other | Attending: Family Medicine | Admitting: Pharmacist

## 2015-03-05 ENCOUNTER — Encounter: Payer: Self-pay | Admitting: Pharmacist

## 2015-03-05 DIAGNOSIS — E1159 Type 2 diabetes mellitus with other circulatory complications: Secondary | ICD-10-CM

## 2015-03-05 DIAGNOSIS — E1165 Type 2 diabetes mellitus with hyperglycemia: Secondary | ICD-10-CM

## 2015-03-05 DIAGNOSIS — Z794 Long term (current) use of insulin: Secondary | ICD-10-CM | POA: Diagnosis not present

## 2015-03-05 DIAGNOSIS — IMO0002 Reserved for concepts with insufficient information to code with codable children: Secondary | ICD-10-CM

## 2015-03-05 LAB — GLUCOSE, POCT (MANUAL RESULT ENTRY): POC Glucose: 136 mg/dl — AB (ref 70–99)

## 2015-03-05 MED ORDER — METFORMIN HCL ER 500 MG PO TB24: 2000.0000 mg | ORAL_TABLET | Freq: Every day | ORAL | Status: DC

## 2015-03-05 NOTE — Patient Instructions (Addendum)
Thanks for coming to see me!  Call Medicaid about the strips - they think you are institutionalized and therefore won't cover them.  I changed the metformin to the 500 mg, which will be covered. Take 4 of these tablets a day  Come back and see Funches in 2 weeks per her directions

## 2015-03-05 NOTE — Progress Notes (Signed)
S:    Patient arrives in good spirits.  Presents for diabetes evaluation, education, and management at the request of Dr. Adrian Blackwater. Patient was referred on 02/18/15.  Patient was last seen by Primary Care Provider on 02/18/15.   Patient reports adherence with medications.  Current diabetes medications include: Byetta 5 mcg BID, glimepiride 2 mg daily, Humalog 15 units BID, metformin 1000 mg BID. Patient has not been taking Toujeo 88 units anymore.   Patient denies hypoglycemic events.  Patient reported exercise habits: none   Patient reports nocturia.  Patient reports neuropathy. Patient denies visual changes. Patient reports self foot exams.    O:  Lab Results  Component Value Date   HGBA1C 8.80 02/18/2015   There were no vitals filed for this visit.  POCT glucose = 136  A/P: Diabetes currently uncontrolled based on A1c of 8.8. Patient denies hypoglycemic events and is able to verbalize appropriate hypoglycemia management plan. Patient reports adherence with medication. Control is suboptimal due to sedentary lifestyle.  Continue all medications as prescribed. Medicaid will not cover metformin 1000 mg ER so will order 500 mg ER tablets and patient will take 4 tablets daily.  Called [facility] and they reported that Medicaid reports that the patient is institutionalized so he cannot get his strips covered until that is fixed. Patient said case manager has resolved this but there is nothing we can do on our end so patient will follow up with case manager. Let him know that he can get [employer] brand meter and strips for $10 at [facility] to help him until he can get this resolved. Patient verbalized understanding. Once I have more readings, we can adjust his medications as needed.   Written patient instructions provided.  Total time in face to face counseling 20 minutes.  Follow up in Pharmacist Clinic Visit PRN, next visit planned with Dr. Adrian Blackwater.

## 2015-03-08 ENCOUNTER — Ambulatory Visit: Payer: Medicaid Other | Admitting: Podiatry

## 2015-03-18 ENCOUNTER — Encounter: Payer: Self-pay | Admitting: Family Medicine

## 2015-03-18 ENCOUNTER — Ambulatory Visit: Payer: Medicaid Other | Attending: Family Medicine | Admitting: Family Medicine

## 2015-03-18 VITALS — BP 148/100 | HR 77 | Temp 97.5°F | Resp 16 | Ht 72.0 in | Wt 239.0 lb

## 2015-03-18 DIAGNOSIS — Z79899 Other long term (current) drug therapy: Secondary | ICD-10-CM | POA: Diagnosis not present

## 2015-03-18 DIAGNOSIS — E1159 Type 2 diabetes mellitus with other circulatory complications: Secondary | ICD-10-CM | POA: Diagnosis not present

## 2015-03-18 DIAGNOSIS — I509 Heart failure, unspecified: Secondary | ICD-10-CM | POA: Insufficient documentation

## 2015-03-18 DIAGNOSIS — Z794 Long term (current) use of insulin: Secondary | ICD-10-CM | POA: Diagnosis not present

## 2015-03-18 DIAGNOSIS — E1165 Type 2 diabetes mellitus with hyperglycemia: Secondary | ICD-10-CM | POA: Diagnosis present

## 2015-03-18 DIAGNOSIS — M7989 Other specified soft tissue disorders: Secondary | ICD-10-CM | POA: Insufficient documentation

## 2015-03-18 DIAGNOSIS — Z87891 Personal history of nicotine dependence: Secondary | ICD-10-CM | POA: Diagnosis not present

## 2015-03-18 DIAGNOSIS — I10 Essential (primary) hypertension: Secondary | ICD-10-CM | POA: Diagnosis not present

## 2015-03-18 DIAGNOSIS — IMO0002 Reserved for concepts with insufficient information to code with codable children: Secondary | ICD-10-CM

## 2015-03-18 LAB — BASIC METABOLIC PANEL
BUN: 9 mg/dL (ref 7–25)
CALCIUM: 9.5 mg/dL (ref 8.6–10.3)
CO2: 25 mmol/L (ref 20–31)
CREATININE: 0.8 mg/dL (ref 0.70–1.33)
Chloride: 104 mmol/L (ref 98–110)
GLUCOSE: 96 mg/dL (ref 65–99)
Potassium: 4.2 mmol/L (ref 3.5–5.3)
Sodium: 138 mmol/L (ref 135–146)

## 2015-03-18 LAB — GLUCOSE, POCT (MANUAL RESULT ENTRY): POC Glucose: 122 mg/dl — AB (ref 70–99)

## 2015-03-18 LAB — POCT GLYCOSYLATED HEMOGLOBIN (HGB A1C): Hemoglobin A1C: 8.4

## 2015-03-18 MED ORDER — FUROSEMIDE 40 MG PO TABS: 40.0000 mg | ORAL_TABLET | Freq: Every day | ORAL | Status: DC

## 2015-03-18 MED ORDER — GLIMEPIRIDE 4 MG PO TABS: 8.0000 mg | ORAL_TABLET | Freq: Every day | ORAL | Status: DC

## 2015-03-18 MED ORDER — INSULIN PEN NEEDLE 31G X 8 MM MISC: 1.0000 "application " | Freq: Two times a day (BID) | Status: DC

## 2015-03-18 MED ORDER — EXENATIDE 5 MCG/0.02ML ~~LOC~~ SOPN
10.0000 ug | PEN_INJECTOR | Freq: Two times a day (BID) | SUBCUTANEOUS | Status: DC
Start: 2015-03-18 — End: 2015-04-08

## 2015-03-18 NOTE — Assessment & Plan Note (Signed)
A; improving with the addition of byetta at last appt P: Increase byetta to 10 mcg BID Increase amaryl to 8 mg daily with largest meal, patient's supper Keep insulin dose the same at 15 U TID humalog

## 2015-03-18 NOTE — Progress Notes (Signed)
Patient ID: Javier Quinn, male   DOB: 01-18-1960, 55 y.o.   MRN: [id number] Patient ID: Javier Quinn, male   DOB: 06-30-1960, 55 y.o.   MRN: [id number]   Subjective:  Patient ID: Javier Quinn, male    DOB: Jul 19, 1960  Age: 55 y.o. MRN: [id number]  CC: Congestive Heart Failure and Diabetes   HPI SIMS LADAY presents for    1. CHRONIC DIABETES  Disease Monitoring  Blood Sugar Ranges:  101-221  Polyuria: no   Visual problems: no   Medication Compliance: yes  Medication Side Effects  Hypoglycemia: no   Patient was unable to have neck surgery due to elevated A1c. Goal A1c for surgery is < 7. He exercises in his wheelchair. He is compliant with low carb diet.   Social History  Substance Use Topics  . Smoking status: Former Smoker -- 0.50 packs/day for 20 years    Types: Cigarettes    Quit date: 04/11/2012  . Smokeless tobacco: Former Systems developer  . Alcohol Use: No     Comment: occ    Outpatient Prescriptions Prior to Visit  Medication Sig Dispense Refill  . acetaminophen (TYLENOL) 500 MG tablet Take 1,000 mg by mouth daily.     [name] albuterol (PROVENTIL HFA;VENTOLIN HFA) 108 (90 BASE) MCG/ACT inhaler Inhale 1-2 puffs into the lungs every 4 (four) hours as needed for wheezing or shortness of breath.    [name] atorvastatin (LIPITOR) 40 MG tablet Take 40 mg by mouth daily.    . Blood Glucose Monitoring Suppl (ACCU-CHEK AVIVA PLUS) w/Device KIT 1 Device by Does not apply route 3 (three) times daily after meals. E11.65 1 kit 0  . clopidogrel (PLAVIX) 75 MG tablet Take 1 tablet (75 mg total) by mouth daily with breakfast. 30 tablet 6  . diltiazem (CARDIZEM CD) 240 MG 24 hr capsule Take 240 mg by mouth daily.    . divalproex (DEPAKOTE ER) 250 MG 24 hr tablet   0  . docusate sodium (COLACE) 100 MG capsule Take 100 mg by mouth daily.     [name] exenatide (BYETTA 5 MCG PEN) 5 MCG/0.02ML SOPN injection Inject 0.02 mLs (5 mcg total) into the skin 2 (two) times daily with a meal. 1.2 mL 5  . furosemide  (LASIX) 40 MG tablet Take 1 tablet (40 mg total) by mouth daily. (Patient taking differently: Take 40 mg by mouth daily as needed for fluid or edema (leg swelling). ) 30 tablet 3  . gabapentin (NEURONTIN) 300 MG capsule Take 1 capsule (300 mg total) by mouth 3 (three) times daily. 90 capsule 5  . glimepiride (AMARYL) 2 MG tablet Take 1 tablet (2 mg total) by mouth daily before breakfast. Start 2 mg daily with breakfast, increase to 4 mg daily in one week 60 tablet 3  . glucose blood (ACCU-CHEK AVIVA PLUS) test strip 1 each by Other route 3 (three) times daily. E11.65 100 each 12  . insulin lispro (HUMALOG) 100 UNIT/ML injection Inject 15 Units into the skin 3 (three) times daily with meals.     . Insulin Pen Needle (B-D ULTRAFINE III SHORT PEN) 31G X 8 MM MISC 1 application by Does not apply route 2 (two) times daily. 100 each 3  . latanoprost (XALATAN) 0.005 % ophthalmic solution Place 1 drop into both eyes daily. 2.5 mL 0  . lisinopril (PRINIVIL,ZESTRIL) 40 MG tablet Take 40 mg by mouth daily.    . metFORMIN (GLUCOPHAGE XR) 500 MG 24 hr tablet Take 4 tablets (  2,000 mg total) by mouth daily with breakfast. 120 tablet 3  . sildenafil (VIAGRA) 100 MG tablet Take 0.5-1 tablets (50-100 mg total) by mouth daily as needed for erectile dysfunction. 5 tablet 11  . TRUEPLUS LANCETS 28G MISC 1 each by Does not apply route 3 (three) times daily. 100 each 11   No facility-administered medications prior to visit.    ROS Review of Systems  Constitutional: Negative for fever, chills, fatigue and unexpected weight change.  Eyes: Negative for visual disturbance.  Respiratory: Negative for cough and shortness of breath.   Cardiovascular: Negative for chest pain, palpitations and leg swelling.  Gastrointestinal: Negative for nausea, vomiting, abdominal pain, diarrhea, constipation and blood in stool.  Endocrine: Negative for polydipsia, polyphagia and polyuria.  Musculoskeletal: Positive for arthralgias (R  shouder ). Negative for myalgias, back pain, gait problem and neck pain.  Skin: Negative for rash.  Allergic/Immunologic: Negative for immunocompromised state.  Hematological: Negative for adenopathy. Does not bruise/bleed easily.  Psychiatric/Behavioral: Negative for suicidal ideas, sleep disturbance and dysphoric mood. The patient is not nervous/anxious.     Objective:  BP 148/100 mmHg  Pulse 77  Temp(Src) 97.5 F (36.4 C) (Oral)  Resp 16  Ht 6' (1.829 m)  Wt 239 lb (108.41 kg)  BMI 32.41 kg/m2  SpO2 96%  BP/Weight 03/18/2015 02/18/2015 6/83/4196  Systolic BP 222 979 892  Diastolic BP 119 79 91  Wt. (Lbs) 239 239 239  BMI 32.41 32.41 32.41   Wt Readings from Last 3 Encounters:  03/18/15 239 lb (108.41 kg)  02/18/15 239 lb (108.41 kg)  02/01/15 239 lb (108.41 kg)    Physical Exam  Constitutional: He appears well-developed and well-nourished. No distress.  HENT:  Head: Normocephalic and atraumatic.  Neck: Normal range of motion. Neck supple.  Cardiovascular: Normal rate, regular rhythm, normal heart sounds and intact distal pulses.   Pulmonary/Chest: Effort normal and breath sounds normal.  Musculoskeletal: He exhibits no edema.  Neurological: He is alert.  Skin: Skin is warm and dry. No rash noted. No erythema.  Psychiatric: He has a normal mood and affect.   Lab Results  Component Value Date   HGBA1C 8.4 03/18/2015   Last A1c 8.8   CBG 213   Assessment & Plan:   Problem List Items Addressed This Visit    Type 2 diabetes mellitus, uncontrolled ([place]) - Primary (Chronic)    A; improving with the addition of byetta at last appt P: Increase byetta to 10 mcg BID Increase amaryl to 8 mg daily with largest meal, patient's supper Keep insulin dose the same at 15 U TID humalog       Relevant Medications   exenatide (BYETTA 5 MCG PEN) 5 MCG/0.02ML SOPN injection   Insulin Pen Needle (B-D ULTRAFINE III SHORT PEN) 31G X 8 MM MISC   glimepiride (AMARYL) 4 MG tablet     Other Relevant Orders   HgB A1c (Completed)   POCT glucose (manual entry) (Completed)   Left leg swelling (Chronic)   Relevant Medications   furosemide (LASIX) 40 MG tablet   Essential hypertension (Chronic)   Relevant Medications   furosemide (LASIX) 40 MG tablet   Other Relevant Orders   Basic Metabolic Panel      No orders of the defined types were placed in this encounter.    Follow-up: No Follow-up on file.   Boykin Nearing MD

## 2015-03-18 NOTE — Patient Instructions (Addendum)
Javier Quinn was seen today for congestive heart failure and diabetes.  Diagnoses and all orders for this visit:  Uncontrolled type 2 diabetes mellitus with other circulatory complication, with long-term current use of insulin (HCC) -     HgB A1c -     POCT glucose (manual entry) -     exenatide (BYETTA 5 MCG PEN) 5 MCG/0.02ML SOPN injection; Inject 0.04 mLs (10 mcg total) into the skin 2 (two) times daily with a meal. -     Insulin Pen Needle (B-D ULTRAFINE III SHORT PEN) 31G X 8 MM MISC; 1 application by Does not apply route 2 (two) times daily. -     Discontinue: glimepiride (AMARYL) 4 MG tablet; Take 2 tablets (8 mg total) by mouth daily with breakfast. -     glimepiride (AMARYL) 4 MG tablet; Take 2 tablets (8 mg total) by mouth daily with supper.  Essential hypertension -     Basic Metabolic Panel -     furosemide (LASIX) 40 MG tablet; Take 1 tablet (40 mg total) by mouth daily.  Left leg swelling -     furosemide (LASIX) 40 MG tablet; Take 1 tablet (40 mg total) by mouth daily.   F/u in 4 weeks for A1c check   Dr. Adrian Blackwater

## 2015-03-18 NOTE — Progress Notes (Signed)
F/U DM, A1c  Glucose running 101-221 Taking medication as prescribed  Pain scale #6 Rt shoulder  No tobacco user  No suicidal thoughts in the past two weeks

## 2015-04-08 ENCOUNTER — Telehealth: Payer: Self-pay | Admitting: Family Medicine

## 2015-04-08 ENCOUNTER — Encounter: Payer: Self-pay | Admitting: Family Medicine

## 2015-04-08 ENCOUNTER — Encounter: Payer: Self-pay | Admitting: Clinical

## 2015-04-08 ENCOUNTER — Ambulatory Visit: Payer: Medicaid Other | Attending: Family Medicine | Admitting: Family Medicine

## 2015-04-08 VITALS — BP 111/74 | HR 73 | Temp 97.8°F | Resp 16 | Ht 72.0 in | Wt 240.0 lb

## 2015-04-08 DIAGNOSIS — Z79899 Other long term (current) drug therapy: Secondary | ICD-10-CM | POA: Insufficient documentation

## 2015-04-08 DIAGNOSIS — E1165 Type 2 diabetes mellitus with hyperglycemia: Secondary | ICD-10-CM | POA: Diagnosis not present

## 2015-04-08 DIAGNOSIS — E1159 Type 2 diabetes mellitus with other circulatory complications: Secondary | ICD-10-CM

## 2015-04-08 DIAGNOSIS — Z794 Long term (current) use of insulin: Secondary | ICD-10-CM | POA: Diagnosis not present

## 2015-04-08 DIAGNOSIS — Z87891 Personal history of nicotine dependence: Secondary | ICD-10-CM | POA: Diagnosis not present

## 2015-04-08 DIAGNOSIS — IMO0002 Reserved for concepts with insufficient information to code with codable children: Secondary | ICD-10-CM

## 2015-04-08 LAB — GLUCOSE, POCT (MANUAL RESULT ENTRY): POC GLUCOSE: 64 mg/dL — AB (ref 70–99)

## 2015-04-08 LAB — POCT GLYCOSYLATED HEMOGLOBIN (HGB A1C): HEMOGLOBIN A1C: 8.1

## 2015-04-08 MED ORDER — INSULIN LISPRO 100 UNIT/ML ~~LOC~~ SOLN: 18.0000 [IU] | Freq: Three times a day (TID) | SUBCUTANEOUS | Status: DC

## 2015-04-08 MED ORDER — METFORMIN HCL ER 500 MG PO TB24: 2000.0000 mg | ORAL_TABLET | Freq: Every day | ORAL | Status: DC

## 2015-04-08 MED ORDER — GLIMEPIRIDE 4 MG PO TABS: 4.0000 mg | ORAL_TABLET | Freq: Every day | ORAL | Status: DC

## 2015-04-08 MED ORDER — EXENATIDE 5 MCG/0.02ML ~~LOC~~ SOPN: 5.0000 ug | PEN_INJECTOR | Freq: Two times a day (BID) | SUBCUTANEOUS | Status: DC

## 2015-04-08 MED ORDER — INSULIN LISPRO 100 UNIT/ML ~~LOC~~ SOLN: 15.0000 [IU] | Freq: Three times a day (TID) | SUBCUTANEOUS | Status: DC

## 2015-04-08 MED ORDER — GLIMEPIRIDE 4 MG PO TABS: 8.0000 mg | ORAL_TABLET | Freq: Every day | ORAL | Status: DC

## 2015-04-08 NOTE — Progress Notes (Signed)
Depression screen [facility] 2/9 04/08/2015 03/18/2015 01/18/2015 01/04/2015 11/16/2014  Decreased Interest 3 0 3 0 0  Down, Depressed, Hopeless 0 0 0 0 0  PHQ - 2 Score 3 0 3 0 0  Altered sleeping 0 2 3 - -  Tired, decreased energy 0 0 3 - -  Change in appetite 0 0 0 - -  Feeling bad or failure about yourself  0 0 0 - -  Trouble concentrating 0 0 0 - -  Moving slowly or fidgety/restless 0 0 0 - -  Suicidal thoughts 0 0 0 - -  PHQ-9 Score 3 2 9  - -    GAD 7 : Generalized Anxiety Score 04/08/2015 03/18/2015 01/18/2015  Nervous, Anxious, on Edge 0 0 0  Control/stop worrying 0 0 0  Worry too much - different things 0 0 0  Trouble relaxing 0 0 0  Restless 0 3 0  Easily annoyed or irritable 0 0 0  Afraid - awful might happen 0 0 0  Total GAD 7 Score 0 3 0

## 2015-04-08 NOTE — Assessment & Plan Note (Addendum)
A: improving diabetes with some low sugars and GI upset when increased byetta dose. A1c is 8.1 today, however range suggest A1c is likely 6.8-7. Patient's A1c is lagging behind CBGs.  P:  Increase humalog to 17 U TID  Decrease byetta down to 5 mcg BID Continue metformin 2000 mg daily Continue amaryl 8 mg nightly   I have placed a call to Dr. Louanne Skye to determine if the A1c goal for pre-op could be safely adjusted.

## 2015-04-08 NOTE — Telephone Encounter (Signed)
Called to Dr. Louanne Skye at [facility] requesting a call back to discuss pre-op A1c goal Patient is currently at 8.1 He has some low CBGs and a range suggesting at A1c of 6.8

## 2015-04-08 NOTE — Progress Notes (Signed)
Pt here for A1c recheck  Taking medication as prescribed  No pain today  No suicidal thoughts in the past two week

## 2015-04-08 NOTE — Progress Notes (Addendum)
Patient ID: ASANTE BLANDA, male   DOB: September 16, 1960, 55 y.o.   MRN: [id number]   Subjective:  Patient ID: Javier Quinn, male    DOB: 03/23/1960  Age: 55 y.o. MRN: [id number]  CC: Follow-up   HPI TEVEN MITTMAN presents for    1. CHRONIC DIABETES  Disease Monitoring  Blood Sugar Ranges:  68-273. 273 was after eating.  Usually around 173 after meals,   Polyuria: no   Visual problems: no   Medication Compliance: yes, taking 17 U of humalog, instead of the 15 U.  Medication Side Effects  Hypoglycemia: no   Patient was unable to have RIGHT C4-5 CERVICAL FORAMINOTOMY due to elevated A1c. Goal A1c for surgery is < 7. He exercises in his wheelchair. He is compliant with low carb diet.   Social History  Substance Use Topics  . Smoking status: Former Smoker -- 0.50 packs/day for 20 years    Types: Cigarettes    Quit date: 04/11/2012  . Smokeless tobacco: Former Systems developer  . Alcohol Use: No     Comment: occ    Outpatient Prescriptions Prior to Visit  Medication Sig Dispense Refill  . acetaminophen (TYLENOL) 500 MG tablet Take 1,000 mg by mouth daily.     [name] albuterol (PROVENTIL HFA;VENTOLIN HFA) 108 (90 BASE) MCG/ACT inhaler Inhale 1-2 puffs into the lungs every 4 (four) hours as needed for wheezing or shortness of breath.    [name] atorvastatin (LIPITOR) 40 MG tablet Take 40 mg by mouth daily.    . Blood Glucose Monitoring Suppl (ACCU-CHEK AVIVA PLUS) w/Device KIT 1 Device by Does not apply route 3 (three) times daily after meals. E11.65 1 kit 0  . clopidogrel (PLAVIX) 75 MG tablet Take 1 tablet (75 mg total) by mouth daily with breakfast. 30 tablet 6  . diltiazem (CARDIZEM CD) 240 MG 24 hr capsule Take 240 mg by mouth daily.    . divalproex (DEPAKOTE ER) 250 MG 24 hr tablet   0  . docusate sodium (COLACE) 100 MG capsule Take 100 mg by mouth daily.     [name] exenatide (BYETTA 5 MCG PEN) 5 MCG/0.02ML SOPN injection Inject 0.04 mLs (10 mcg total) into the skin 2 (two) times daily with a meal. 1.2 mL 5    . furosemide (LASIX) 40 MG tablet Take 1 tablet (40 mg total) by mouth daily. 30 tablet 3  . gabapentin (NEURONTIN) 300 MG capsule Take 1 capsule (300 mg total) by mouth 3 (three) times daily. 90 capsule 5  . glimepiride (AMARYL) 4 MG tablet Take 2 tablets (8 mg total) by mouth daily with supper. 60 tablet 11  . glucose blood (ACCU-CHEK AVIVA PLUS) test strip 1 each by Other route 3 (three) times daily. E11.65 100 each 12  . insulin lispro (HUMALOG) 100 UNIT/ML injection Inject 15 Units into the skin 3 (three) times daily with meals.     . Insulin Pen Needle (B-D ULTRAFINE III SHORT PEN) 31G X 8 MM MISC 1 application by Does not apply route 2 (two) times daily. 100 each 3  . latanoprost (XALATAN) 0.005 % ophthalmic solution Place 1 drop into both eyes daily. 2.5 mL 0  . lisinopril (PRINIVIL,ZESTRIL) 40 MG tablet Take 40 mg by mouth daily.    . metFORMIN (GLUCOPHAGE XR) 500 MG 24 hr tablet Take 4 tablets (2,000 mg total) by mouth daily with breakfast. 120 tablet 3  . sildenafil (VIAGRA) 100 MG tablet Take 0.5-1 tablets (50-100 mg total) by mouth daily as  needed for erectile dysfunction. 5 tablet 11  . TRUEPLUS LANCETS 28G MISC 1 each by Does not apply route 3 (three) times daily. 100 each 11   No facility-administered medications prior to visit.    ROS Review of Systems  Constitutional: Negative for fever, chills, fatigue and unexpected weight change.  Eyes: Negative for visual disturbance.  Respiratory: Negative for cough and shortness of breath.   Cardiovascular: Negative for chest pain, palpitations and leg swelling.  Gastrointestinal: Positive for nausea (nausea with byetta ). Negative for vomiting, abdominal pain, diarrhea, constipation and blood in stool.  Endocrine: Negative for polydipsia, polyphagia and polyuria.  Musculoskeletal: Positive for arthralgias (R shouder ). Negative for myalgias, back pain, gait problem and neck pain.  Skin: Negative for rash.  Allergic/Immunologic:  Negative for immunocompromised state.  Hematological: Negative for adenopathy. Does not bruise/bleed easily.  Psychiatric/Behavioral: Negative for suicidal ideas, sleep disturbance and dysphoric mood. The patient is not nervous/anxious.     Objective:  BP 111/74 mmHg  Pulse 73  Temp(Src) 97.8 F (36.6 C) (Oral)  Resp 16  Ht 6' (1.829 m)  SpO2 97%  BP/Weight 04/08/2015 03/18/2015 07/15/6267  Systolic BP 485 462 703  Diastolic BP 74 500 79  Wt. (Lbs) 240 239 239  BMI 32.54 32.41 32.41   Wt Readings from Last 3 Encounters:  03/18/15 239 lb (108.41 kg)  02/18/15 239 lb (108.41 kg)  02/01/15 239 lb (108.41 kg)    Physical Exam  Constitutional: He appears well-developed and well-nourished. No distress.  HENT:  Head: Normocephalic and atraumatic.  Neck: Normal range of motion. Neck supple.  Cardiovascular: Normal rate, regular rhythm, normal heart sounds and intact distal pulses.   Pulmonary/Chest: Effort normal and breath sounds normal.  Musculoskeletal: He exhibits no edema.       Legs: Neurological: He is alert.  Skin: Skin is warm and dry. No rash noted. No erythema.  Psychiatric: He has a normal mood and affect.   Lab Results  Component Value Date   HGBA1C 8.1 04/08/2015   CBG 64  Assessment & Plan:   Problem List Items Addressed This Visit    Type 2 diabetes mellitus, uncontrolled ([place]) - Primary (Chronic)   Relevant Medications   metFORMIN (GLUCOPHAGE XR) 500 MG 24 hr tablet   insulin lispro (HUMALOG) 100 UNIT/ML injection   exenatide (BYETTA 5 MCG PEN) 5 MCG/0.02ML SOPN injection   glimepiride (AMARYL) 4 MG tablet   Other Relevant Orders   POCT glycosylated hemoglobin (Hb A1C) (Completed)   POCT glucose (manual entry) (Completed)      No orders of the defined types were placed in this encounter.    Follow-up: No Follow-up on file.   Boykin Nearing MD

## 2015-04-08 NOTE — Patient Instructions (Addendum)
Javier Quinn was seen today for follow-up.  Diagnoses and all orders for this visit:  Uncontrolled type 2 diabetes mellitus with other circulatory complication, with long-term current use of insulin (HCC) -     POCT glycosylated hemoglobin (Hb A1C) -     POCT glucose (manual entry) -     metFORMIN (GLUCOPHAGE XR) 500 MG 24 hr tablet; Take 4 tablets (2,000 mg total) by mouth daily with breakfast. -     Discontinue: insulin lispro (HUMALOG) 100 UNIT/ML injection; Inject 0.15 mLs (15 Units total) into the skin 3 (three) times daily with meals. -     Discontinue: glimepiride (AMARYL) 4 MG tablet; Take 1 tablet (4 mg total) by mouth daily with supper. -     insulin lispro (HUMALOG) 100 UNIT/ML injection; Inject 0.18 mLs (18 Units total) into the skin 3 (three) times daily with meals. -     exenatide (BYETTA 5 MCG PEN) 5 MCG/0.02ML SOPN injection; Inject 0.02 mLs (5 mcg total) into the skin 2 (two) times daily with a meal. -     glimepiride (AMARYL) 4 MG tablet; Take 2 tablets (8 mg total) by mouth daily with supper.   Diabetes blood sugar goals  Fasting (in AM before breakfast, 8 hrs of no eating or drinking (except water or unsweetened coffee or tea): 90-110 2 hrs after meals: < 160,   No low sugars: nothing < 70   I have called Dr. Otho Ket office to discuss pre-op  A1c goal.   F/u in 3 weeks for diabetes please schedule appt today   Dr. Adrian Blackwater

## 2015-04-09 ENCOUNTER — Ambulatory Visit (INDEPENDENT_AMBULATORY_CARE_PROVIDER_SITE_OTHER): Payer: Medicaid Other | Admitting: Podiatry

## 2015-04-09 NOTE — Telephone Encounter (Signed)
Called back to Dr. Louanne Skye. He is willing to have patient come in for f/u OV and to discuss a good time to re-schedule his surgery.  Called patient verified name and DOB. Informed him that he should expect a call from Dr. Otho Ket office in a few das.

## 2015-04-29 ENCOUNTER — Ambulatory Visit: Payer: Medicaid Other | Attending: Family Medicine | Admitting: Family Medicine

## 2015-04-29 ENCOUNTER — Encounter: Payer: Self-pay | Admitting: Family Medicine

## 2015-04-29 VITALS — BP 164/99 | HR 70 | Temp 97.7°F | Resp 16 | Ht 72.0 in | Wt 240.0 lb

## 2015-04-29 DIAGNOSIS — E1165 Type 2 diabetes mellitus with hyperglycemia: Secondary | ICD-10-CM | POA: Insufficient documentation

## 2015-04-29 DIAGNOSIS — Z794 Long term (current) use of insulin: Secondary | ICD-10-CM

## 2015-04-29 DIAGNOSIS — E1159 Type 2 diabetes mellitus with other circulatory complications: Secondary | ICD-10-CM

## 2015-04-29 DIAGNOSIS — Z87891 Personal history of nicotine dependence: Secondary | ICD-10-CM | POA: Diagnosis not present

## 2015-04-29 DIAGNOSIS — Z79899 Other long term (current) drug therapy: Secondary | ICD-10-CM | POA: Diagnosis not present

## 2015-04-29 DIAGNOSIS — R05 Cough: Secondary | ICD-10-CM | POA: Insufficient documentation

## 2015-04-29 DIAGNOSIS — I10 Essential (primary) hypertension: Secondary | ICD-10-CM | POA: Diagnosis not present

## 2015-04-29 DIAGNOSIS — IMO0002 Reserved for concepts with insufficient information to code with codable children: Secondary | ICD-10-CM

## 2015-04-29 DIAGNOSIS — M25511 Pain in right shoulder: Secondary | ICD-10-CM | POA: Diagnosis not present

## 2015-04-29 DIAGNOSIS — R059 Cough, unspecified: Secondary | ICD-10-CM | POA: Insufficient documentation

## 2015-04-29 LAB — POCT GLYCOSYLATED HEMOGLOBIN (HGB A1C): HEMOGLOBIN A1C: 7.1

## 2015-04-29 LAB — GLUCOSE, POCT (MANUAL RESULT ENTRY): POC GLUCOSE: 67 mg/dL — AB (ref 70–99)

## 2015-04-29 MED ORDER — POTASSIUM CHLORIDE CRYS ER 20 MEQ PO TBCR: 20.0000 meq | EXTENDED_RELEASE_TABLET | Freq: Every day | ORAL | Status: AC

## 2015-04-29 MED ORDER — DILTIAZEM HCL ER COATED BEADS 240 MG PO CP24: 240.0000 mg | ORAL_CAPSULE | Freq: Every day | ORAL | Status: AC

## 2015-04-29 MED ORDER — IBUPROFEN 400 MG PO TABS: 400.0000 mg | ORAL_TABLET | Freq: Three times a day (TID) | ORAL | Status: DC | PRN

## 2015-04-29 MED ORDER — CETIRIZINE HCL 10 MG PO TABS: 10.0000 mg | ORAL_TABLET | Freq: Every day | ORAL | Status: AC

## 2015-04-29 MED ORDER — GLIMEPIRIDE 4 MG PO TABS: 4.0000 mg | ORAL_TABLET | Freq: Every day | ORAL | Status: DC

## 2015-04-29 MED ORDER — KETOROLAC TROMETHAMINE 60 MG/2ML IM SOLN
60.0000 mg | Freq: Once | INTRAMUSCULAR | Status: AC
  Administered 2015-04-29: 60 mg via INTRAMUSCULAR

## 2015-04-29 MED ORDER — LISINOPRIL 40 MG PO TABS: 40.0000 mg | ORAL_TABLET | Freq: Every day | ORAL | Status: AC

## 2015-04-29 MED ORDER — INSULIN LISPRO 100 UNIT/ML ~~LOC~~ SOLN: 15.0000 [IU] | Freq: Three times a day (TID) | SUBCUTANEOUS | Status: AC

## 2015-04-29 NOTE — Assessment & Plan Note (Signed)
Cough suspect seasonal allergies CXR to r/u PNA as he has hx of PNA

## 2015-04-29 NOTE — Assessment & Plan Note (Signed)
Worsening pain suspect referred from neck toradol shot today Short course of ibuprofen

## 2015-04-29 NOTE — Assessment & Plan Note (Signed)
A; HTN with elevated BP and increase swelling Take lasix 40 mg daily with potassium Continue lisinopril 40  Continue diltiazem 240 mg daily

## 2015-04-29 NOTE — Patient Instructions (Addendum)
Javier Quinn was seen today for diabetes.  Diagnoses and all orders for this visit:  Uncontrolled type 2 diabetes mellitus with other circulatory complication, with long-term current use of insulin (HCC) -     POCT glycosylated hemoglobin (Hb A1C) -     POCT glucose (manual entry) -     glimepiride (AMARYL) 4 MG tablet; Take 1 tablet (4 mg total) by mouth daily with supper. -     insulin lispro (HUMALOG) 100 UNIT/ML injection; Inject 0.15 mLs (15 Units total) into the skin 3 (three) times daily with meals.  Cough -     cetirizine (ZYRTEC) 10 MG tablet; Take 1 tablet (10 mg total) by mouth daily. -     DG Chest 2 View; Future  Right shoulder pain -     ketorolac (TORADOL) injection 60 mg; Inject 2 mLs (60 mg total) into the muscle once. -     ibuprofen (ADVIL,MOTRIN) 400 MG tablet; Take 1 tablet (400 mg total) by mouth every 8 (eight) hours as needed for moderate pain (with food for 5 days).  Essential hypertension -     diltiazem (CARDIZEM CD) 240 MG 24 hr capsule; Take 1 capsule (240 mg total) by mouth daily. -     lisinopril (PRINIVIL,ZESTRIL) 40 MG tablet; Take 1 tablet (40 mg total) by mouth daily. -     potassium chloride SA (K-DUR,KLOR-CON) 20 MEQ tablet; Take 1 tablet (20 mEq total) by mouth daily.   Increase lasix to 40 mg daily as your blood pressure is elevated  Decrease humalog to 15 U TID with meals as needed for CBG > 150 Decrease amaryly to 4 mg daily Keep byetta and metformin the same   F/u in 8 weeks for HTN   Dr. Adrian Blackwater

## 2015-04-29 NOTE — Progress Notes (Signed)
Subjective:  Patient ID: Javier Quinn, male    DOB: Aug 17, 1960  Age: 55 y.o. MRN: [id number]  CC: Diabetes   HPI Javier Quinn presents for    1.  CHRONIC DIABETES  Disease Monitoring  Blood Sugar Ranges: 48-223  Polyuria: no   Visual problems: no   Medication Compliance: yes, but takes insulin prn CBG > 150   Medication Side Effects  Hypoglycemia: yes   2. R shoulder pain: chronic and worsening. Has plan for neck surgery upcoming. Pain is worse when sitting in wheelchair and resting on R arm rest.   3. Cough: x one week. Worse when lying down. No CP or SOB. No worsening leg swelling. Taking lasix prn. Takes lasix 1-2 times per day.   Social History  Substance Use Topics  . Smoking status: Former Smoker -- 0.50 packs/day for 20 years    Types: Cigarettes    Quit date: 04/11/2012  . Smokeless tobacco: Former Systems developer  . Alcohol Use: No     Comment: occ   Outpatient Prescriptions Prior to Visit  Medication Sig Dispense Refill  . acetaminophen (TYLENOL) 500 MG tablet Take 1,000 mg by mouth daily.     [name] atorvastatin (LIPITOR) 40 MG tablet Take 40 mg by mouth daily.    . Blood Glucose Monitoring Suppl (ACCU-CHEK AVIVA PLUS) w/Device KIT 1 Device by Does not apply route 3 (three) times daily after meals. E11.65 1 kit 0  . clopidogrel (PLAVIX) 75 MG tablet Take 1 tablet (75 mg total) by mouth daily with breakfast. 30 tablet 6  . diltiazem (CARDIZEM CD) 240 MG 24 hr capsule Take 240 mg by mouth daily.    . divalproex (DEPAKOTE ER) 250 MG 24 hr tablet   0  . docusate sodium (COLACE) 100 MG capsule Take 100 mg by mouth daily.     [name] exenatide (BYETTA 5 MCG PEN) 5 MCG/0.02ML SOPN injection Inject 0.02 mLs (5 mcg total) into the skin 2 (two) times daily with a meal. 1.2 mL 5  . furosemide (LASIX) 40 MG tablet Take 1 tablet (40 mg total) by mouth daily. 30 tablet 3  . gabapentin (NEURONTIN) 300 MG capsule Take 1 capsule (300 mg total) by mouth 3 (three) times daily. 90 capsule 5  .  glimepiride (AMARYL) 4 MG tablet Take 2 tablets (8 mg total) by mouth daily with supper. 60 tablet 11  . glucose blood (ACCU-CHEK AVIVA PLUS) test strip 1 each by Other route 3 (three) times daily. E11.65 100 each 12  . insulin lispro (HUMALOG) 100 UNIT/ML injection Inject 0.18 mLs (18 Units total) into the skin 3 (three) times daily with meals. 10 mL 5  . Insulin Pen Needle (B-D ULTRAFINE III SHORT PEN) 31G X 8 MM MISC 1 application by Does not apply route 2 (two) times daily. 100 each 3  . latanoprost (XALATAN) 0.005 % ophthalmic solution Place 1 drop into both eyes daily. 2.5 mL 0  . lisinopril (PRINIVIL,ZESTRIL) 40 MG tablet Take 40 mg by mouth daily.    . metFORMIN (GLUCOPHAGE XR) 500 MG 24 hr tablet Take 4 tablets (2,000 mg total) by mouth daily with breakfast. 120 tablet 3  . sildenafil (VIAGRA) 100 MG tablet Take 0.5-1 tablets (50-100 mg total) by mouth daily as needed for erectile dysfunction. 5 tablet 11  . TRUEPLUS LANCETS 28G MISC 1 each by Does not apply route 3 (three) times daily. 100 each 11  . albuterol (PROVENTIL HFA;VENTOLIN HFA) 108 (90 BASE) MCG/ACT inhaler  Inhale 1-2 puffs into the lungs every 4 (four) hours as needed for wheezing or shortness of breath.     No facility-administered medications prior to visit.    ROS Review of Systems  Constitutional: Negative for fever, chills, fatigue and unexpected weight change.  Eyes: Negative for visual disturbance.  Respiratory: Positive for cough. Negative for shortness of breath.   Cardiovascular: Negative for chest pain, palpitations and leg swelling.  Gastrointestinal: Negative for vomiting, abdominal pain, diarrhea, constipation and blood in stool. Nausea: nausea with byetta   Endocrine: Negative for polydipsia, polyphagia and polyuria.  Musculoskeletal: Positive for arthralgias (R shouder ). Negative for myalgias, back pain, gait problem and neck pain.  Skin: Negative for rash.  Allergic/Immunologic: Negative for  immunocompromised state.  Hematological: Negative for adenopathy. Does not bruise/bleed easily.  Psychiatric/Behavioral: Negative for suicidal ideas, sleep disturbance and dysphoric mood. The patient is not nervous/anxious.     Objective:  BP 164/99 mmHg  Pulse 70  Temp(Src) 97.7 F (36.5 C) (Oral)  Resp 16  Ht 6' (1.829 m)  Wt 240 lb (108.863 kg)  BMI 32.54 kg/m2  SpO2 96%  BP/Weight 04/29/2015 04/08/2015 3/66/2947  Systolic BP 654 650 354  Diastolic BP 99 74 656  Wt. (Lbs) 240 240 239  BMI 32.54 32.54 32.41    Physical Exam  Constitutional: He appears well-developed and well-nourished. No distress.  HENT:  Head: Normocephalic and atraumatic.  Neck: Normal range of motion. Neck supple.  Cardiovascular: Normal rate, regular rhythm, normal heart sounds and intact distal pulses.   Pulmonary/Chest: Effort normal and breath sounds normal.  Musculoskeletal: He exhibits edema (trace LLE ).       Legs: Neurological: He is alert.  Skin: Skin is warm and dry. No rash noted. No erythema.  Psychiatric: He has a normal mood and affect.   Lab Results  Component Value Date   HGBA1C 8.1 04/08/2015   Lab Results  Component Value Date   HGBA1C 7.1 04/29/2015    CBG 67  Assessment & Plan:   Javier Quinn was seen today for diabetes.  Diagnoses and all orders for this visit:  Uncontrolled type 2 diabetes mellitus with other circulatory complication, with long-term current use of insulin (HCC) -     POCT glycosylated hemoglobin (Hb A1C) -     POCT glucose (manual entry) -     glimepiride (AMARYL) 4 MG tablet; Take 1 tablet (4 mg total) by mouth daily with supper. -     insulin lispro (HUMALOG) 100 UNIT/ML injection; Inject 0.15 mLs (15 Units total) into the skin 3 (three) times daily with meals.  Cough -     cetirizine (ZYRTEC) 10 MG tablet; Take 1 tablet (10 mg total) by mouth daily. -     DG Chest 2 View; Future  Right shoulder pain -     ketorolac (TORADOL) injection 60 mg;  Inject 2 mLs (60 mg total) into the muscle once. -     ibuprofen (ADVIL,MOTRIN) 400 MG tablet; Take 1 tablet (400 mg total) by mouth every 8 (eight) hours as needed for moderate pain (with food for 5 days).  Essential hypertension -     diltiazem (CARDIZEM CD) 240 MG 24 hr capsule; Take 1 capsule (240 mg total) by mouth daily. -     lisinopril (PRINIVIL,ZESTRIL) 40 MG tablet; Take 1 tablet (40 mg total) by mouth daily. -     potassium chloride SA (K-DUR,KLOR-CON) 20 MEQ tablet; Take 1 tablet (20 mEq total) by mouth daily.  No orders of the defined types were placed in this encounter.    Follow-up: No Follow-up on file.   Boykin Nearing MD

## 2015-04-29 NOTE — Progress Notes (Signed)
F/U DM Glucose been running 48-223 No tobacco user  No suicidal thoughts in the past two weeks  Pain scale #8 rt shoulder pain and neck

## 2015-04-29 NOTE — Assessment & Plan Note (Signed)
Improved with A1c down to 7.1 There is some hypoglycemia  Decrease amaryl to 4 mg daily  Change humalog to 15 U TID prn CBG > 150

## 2015-04-30 ENCOUNTER — Ambulatory Visit (INDEPENDENT_AMBULATORY_CARE_PROVIDER_SITE_OTHER): Payer: Medicaid Other | Admitting: Podiatry

## 2015-04-30 ENCOUNTER — Encounter: Payer: Self-pay | Admitting: Family

## 2015-04-30 ENCOUNTER — Encounter: Payer: Self-pay | Admitting: Podiatry

## 2015-04-30 DIAGNOSIS — M79675 Pain in left toe(s): Secondary | ICD-10-CM | POA: Diagnosis not present

## 2015-04-30 DIAGNOSIS — B351 Tinea unguium: Secondary | ICD-10-CM | POA: Diagnosis not present

## 2015-04-30 NOTE — Patient Instructions (Signed)
Diabetes and Foot Care Diabetes may cause you to have problems because of poor blood supply (circulation) to your feet and legs. This may cause the skin on your feet to become thinner, break easier, and heal more slowly. Your skin may become dry, and the skin may peel and crack. You may also have nerve damage in your legs and feet causing decreased feeling in them. You may not notice minor injuries to your feet that could lead to infections or more serious problems. Taking care of your feet is one of the most important things you can do for yourself.  HOME CARE INSTRUCTIONS  Wear shoes at all times, even in the house. Do not go barefoot. Bare feet are easily injured.  Check your feet daily for blisters, cuts, and redness. If you cannot see the bottom of your feet, use a mirror or ask someone for help.  Wash your feet with warm water (do not use hot water) and mild soap. Then pat your feet and the areas between your toes until they are completely dry. Do not soak your feet as this can dry your skin.  Apply a moisturizing lotion or petroleum jelly (that does not contain alcohol and is unscented) to the skin on your feet and to dry, brittle toenails. Do not apply lotion between your toes.  Trim your toenails straight across. Do not dig under them or around the cuticle. File the edges of your nails with an emery board or nail file.  Do not cut corns or calluses or try to remove them with medicine.  Wear clean socks or stockings every day. Make sure they are not too tight. Do not wear knee-high stockings since they may decrease blood flow to your legs.  Wear shoes that fit properly and have enough cushioning. To break in new shoes, wear them for just a few hours a day. This prevents you from injuring your feet. Always look in your shoes before you put them on to be sure there are no objects inside.  Do not cross your legs. This may decrease the blood flow to your feet.  If you find a minor scrape,  cut, or break in the skin on your feet, keep it and the skin around it clean and dry. These areas may be cleansed with mild soap and water. Do not cleanse the area with peroxide, alcohol, or iodine.  When you remove an adhesive bandage, be sure not to damage the skin around it.  If you have a wound, look at it several times a day to make sure it is healing.  Do not use heating pads or hot water bottles. They may burn your skin. If you have lost feeling in your feet or legs, you may not know it is happening until it is too late.  Make sure your health care provider performs a complete foot exam at least annually or more often if you have foot problems. Report any cuts, sores, or bruises to your health care provider immediately. SEEK MEDICAL CARE IF:   You have an injury that is not healing.  You have cuts or breaks in the skin.  You have an ingrown nail.  You notice redness on your legs or feet.  You feel burning or tingling in your legs or feet.  You have pain or cramps in your legs and feet.  Your legs or feet are numb.  Your feet always feel cold. SEEK IMMEDIATE MEDICAL CARE IF:   There is increasing redness,   swelling, or pain in or around a wound.  There is a red line that goes up your leg.  Pus is coming from a wound.  You develop a fever or as directed by your health care provider.  You notice a bad smell coming from an ulcer or wound.   This information is not intended to replace advice given to you by your health care provider. Make sure you discuss any questions you have with your health care provider.   Document Released: 12/20/1999 Document Revised: 08/24/2012 Document Reviewed: 05/31/2012 Elsevier Interactive Patient Education 2016 [employer]  

## 2015-04-30 NOTE — Progress Notes (Signed)
Patient ID: Javier Quinn, male   DOB: 29-Jul-1960, 55 y.o.   MRN: [id number]  Subjective: This patient presents today complaining of uncomfortable toenails 1-5 the right foot when walking and wearing shoes and requests toenail debridement. 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

## 2015-05-01 ENCOUNTER — Ambulatory Visit (HOSPITAL_COMMUNITY)
Admission: RE | Admit: 2015-05-01 | Discharge: 2015-05-01 | Disposition: A | Discharge status: Home / Self Care | Payer: Medicaid Other | Source: Ambulatory Visit | Attending: Family | Admitting: Family

## 2015-05-01 DIAGNOSIS — J9811 Atelectasis: Secondary | ICD-10-CM | POA: Diagnosis not present

## 2015-05-01 DIAGNOSIS — R059 Cough, unspecified: Secondary | ICD-10-CM

## 2015-05-01 DIAGNOSIS — R05 Cough: Secondary | ICD-10-CM

## 2015-05-03 ENCOUNTER — Ambulatory Visit (INDEPENDENT_AMBULATORY_CARE_PROVIDER_SITE_OTHER): Payer: Medicaid Other | Admitting: Family

## 2015-05-03 ENCOUNTER — Encounter: Payer: Self-pay | Admitting: Family

## 2015-05-03 ENCOUNTER — Encounter (HOSPITAL_COMMUNITY): Payer: Medicaid Other

## 2015-05-03 VITALS — BP 157/99 | HR 81 | Temp 98.2°F | Resp 16

## 2015-05-03 DIAGNOSIS — E1151 Type 2 diabetes mellitus with diabetic peripheral angiopathy without gangrene: Secondary | ICD-10-CM | POA: Diagnosis not present

## 2015-05-03 DIAGNOSIS — E1142 Type 2 diabetes mellitus with diabetic polyneuropathy: Secondary | ICD-10-CM

## 2015-05-03 DIAGNOSIS — I779 Disorder of arteries and arterioles, unspecified: Secondary | ICD-10-CM

## 2015-05-03 DIAGNOSIS — Z87891 Personal history of nicotine dependence: Secondary | ICD-10-CM

## 2015-05-03 DIAGNOSIS — Z89611 Acquired absence of right leg above knee: Secondary | ICD-10-CM | POA: Diagnosis not present

## 2015-05-03 NOTE — Patient Instructions (Signed)
Peripheral Vascular Disease Peripheral vascular disease (PVD) is a disease of the blood vessels that are not part of your heart and brain. A simple term for PVD is poor circulation. In most cases, PVD narrows the blood vessels that carry blood from your heart to the rest of your body. This can result in a decreased supply of blood to your arms, legs, and internal organs, like your stomach or kidneys. However, it most often affects a person's lower legs and feet. There are two types of PVD.  Organic PVD. This is the more common type. It is caused by damage to the structure of blood vessels.  Functional PVD. This is caused by conditions that make blood vessels contract and tighten (spasm). Without treatment, PVD tends to get worse over time. PVD can also lead to acute ischemic limb. This is when an arm or limb suddenly has trouble getting enough blood. This is a medical emergency. CAUSES Each type of PVD has many different causes. The most common cause of PVD is buildup of a fatty material (plaque) inside of your arteries (atherosclerosis). Small amounts of plaque can break off from the walls of the blood vessels and become lodged in a smaller artery. This blocks blood flow and can cause acute ischemic limb. Other common causes of PVD include:  Blood clots that form inside of blood vessels.  Injuries to blood vessels.  Diseases that cause inflammation of blood vessels or cause blood vessel spasms.  Health behaviors and health history that increase your risk of developing PVD. RISK FACTORS  You may have a greater risk of PVD if you:  Have a family history of PVD.  Have certain medical conditions, including:  High cholesterol.  Diabetes.  High blood pressure (hypertension).  Coronary heart disease.  Past problems with blood clots.  Past injury, such as burns or a broken bone. These may have damaged blood vessels in your limbs.  Buerger disease. This is caused by inflamed blood  vessels in your hands and feet.  Some forms of arthritis.  Rare birth defects that affect the arteries in your legs.  Use tobacco.  Do not get enough exercise.  Are obese.  Are age 50 or older. SIGNS AND SYMPTOMS  PVD may cause many different symptoms. Your symptoms depend on what part of your body is not getting enough blood. Some common signs and symptoms include:  Cramps in your lower legs. This may be a symptom of poor leg circulation (claudication).  Pain and weakness in your legs while you are physically active that goes away when you rest (intermittent claudication).  Leg pain when at rest.  Leg numbness, tingling, or weakness.  Coldness in a leg or foot, especially when compared with the other leg.  Skin or hair changes. These can include:  Hair loss.  Shiny skin.  Pale or bluish skin.  Thick toenails.  Inability to get or maintain an erection (erectile dysfunction). People with PVD are more prone to developing ulcers and sores on their toes, feet, or legs. These may take longer than normal to heal. DIAGNOSIS Your health care provider may diagnose PVD from your signs and symptoms. The health care provider will also do a physical exam. You may have tests to find out what is causing your PVD and determine its severity. Tests may include:  Blood pressure recordings from your arms and legs and measurements of the strength of your pulses (pulse volume recordings).  Imaging studies using sound waves to take pictures of   the blood flow through your blood vessels (Doppler ultrasound).  Injecting a dye into your blood vessels before having imaging studies using:  X-rays (angiogram or arteriogram).  Computer-generated X-rays (CT angiogram).  A powerful electromagnetic field and a computer (magnetic resonance angiogram or MRA). TREATMENT Treatment for PVD depends on the cause of your condition and the severity of your symptoms. It also depends on your age. Underlying  causes need to be treated and controlled. These include long-lasting (chronic) conditions, such as diabetes, high cholesterol, and high blood pressure. You may need to first try making lifestyle changes and taking medicines. Surgery may be needed if these do not work. Lifestyle changes may include:  Quitting smoking.  Exercising regularly.  Following a low-fat, low-cholesterol diet. Medicines may include:  Blood thinners to prevent blood clots.  Medicines to improve blood flow.  Medicines to improve your blood cholesterol levels. Surgical procedures may include:  A procedure that uses an inflated balloon to open a blocked artery and improve blood flow (angioplasty).  A procedure to put in a tube (stent) to keep a blocked artery open (stent implant).  Surgery to reroute blood flow around a blocked artery (peripheral bypass surgery).  Surgery to remove dead tissue from an infected wound on the affected limb.  Amputation. This is surgical removal of the affected limb. This may be necessary in cases of acute ischemic limb that are not improved through medical or surgical treatments. HOME CARE INSTRUCTIONS  Take medicines only as directed by your health care provider.  Do not use any tobacco products, including cigarettes, chewing tobacco, or electronic cigarettes. If you need help quitting, ask your health care provider.  Lose weight if you are overweight, and maintain a healthy weight as directed by your health care provider.  Eat a diet that is low in fat and cholesterol. If you need help, ask your health care provider.  Exercise regularly. Ask your health care provider to suggest some good activities for you.  Use compression stockings or other mechanical devices as directed by your health care provider.  Take good care of your feet.  Wear comfortable shoes that fit well.  Check your feet often for any cuts or sores. SEEK MEDICAL CARE IF:  You have cramps in your legs  while walking.  You have leg pain when you are at rest.  You have coldness in a leg or foot.  Your skin changes.  You have erectile dysfunction.  You have cuts or sores on your feet that are not healing. SEEK IMMEDIATE MEDICAL CARE IF:  Your arm or leg turns cold and blue.  Your arms or legs become red, warm, swollen, painful, or numb.  You have chest pain or trouble breathing.  You suddenly have weakness in your face, arm, or leg.  You become very confused or lose the ability to speak.  You suddenly have a very bad headache or lose your vision.   This information is not intended to replace advice given to you by your health care provider. Make sure you discuss any questions you have with your health care provider.   Document Released: 01/30/2004 Document Revised: 01/12/2014 Document Reviewed: 06/01/2013 Elsevier Interactive Patient Education 2016 [employer]  

## 2015-05-03 NOTE — Progress Notes (Signed)
[facility] [place] HISTORY AND PHYSICAL -PAD  History of Present Illness Javier Quinn is a 55 y.o. male patient of Dr. Bridgett Larsson who presents with chief complaint: follow up for PAD. Previous operation(s) completed include: failed R SFA to peroneal bypass, PTA distal anastomosis, R AKA.  The patient's symptoms are: neuropathic pain in L foot and diffuse numbness if L foot.  The patient's treatment regimen currently includes: maximal medical management, physical therapy has ended. He has a R AKA prosthesis but his ambulation is limited by his L foot pain with ambulation, described as pins and needles with some severe burning in his great toe  He continues to have mild swelling in his left foot/lower leg; swelling resolves overnight with left leg elevation.  The pt denies non healing wounds on his right AKA stump or left lower extremity.  He denies any history of stroke or TIA.  Pt states that his orthopedic doctor determined that his right shoulder to right leg pain is likely from a pinched nerve in his back.  The plantar surface of his left foot is painful with walking, the more he walks the more painful this becomes. This limits his walking.   Pt smoker: former smoker, quit in 2014 DM : 01/09/15 A1C was 9.4 (average daily glucose of 223) (review of records), 7.1 A1C on 04/29/15, improved glycemic control, states he can now schedule his c-spine surgery  Pt meds include: Statin :Yes, Lipitor Betablocker: No ASA: No Other anticoagulants/antiplatelets: Plavix   Past Medical History  Diagnosis Date  . Diabetes mellitus without complication ([place])   . Hyperlipidemia   . Tobacco dependence   . GERD (gastroesophageal reflux disease)   . H/O hiatal hernia   . Peripheral vascular disease ([place])   . Hypertension     See's Dr Johnsie Cancel States   . PVD (peripheral vascular disease) with claudication ([place]) 05/18/2012  . Atherosclerotic peripheral vascular disease with  intermittent claudication ([place]) 05/18/2012  . Unspecified essential hypertension 05/18/2012  . Type II or unspecified type diabetes mellitus with peripheral circulatory disorders, uncontrolled(250.72) 10/11/2012  . Dyslipidemia 08/10/2012  . Above knee amputation of right lower extremity [facility])     Social History Social History  Substance Use Topics  . Smoking status: Former Smoker -- 0.50 packs/day for 20 years    Types: Cigarettes    Quit date: 04/11/2012  . Smokeless tobacco: Former Systems developer  . Alcohol Use: No     Comment: occ    Family History Family History  Problem Relation Age of Onset  . Cancer Mother     pancreas  . Cancer Father   . Heart attack Sister     Past Surgical History  Procedure Laterality Date  . Bypass graft femoral-peroneal Right 06/15/2012    Procedure: BYPASS GRAFT [place] Stall;  Surgeon: Conrad [place], MD;  Location: [place];  Service: Vascular;  Laterality: Right;  using right non-reversed saphenous vein  . Lower extremity angiogram Right 06/15/2012    Procedure: LOWER EXTREMITY ANGIOGRAM;  Surgeon: Conrad Affton, MD;  Location: [place];  Service: Vascular;  Laterality: Right;  . Lower extremity angiogram Right 09/09/2012    Procedure: LOWER EXTREMITY ANGIOGRAM With Agioplasty Bypass Graft;  Surgeon: Conrad [place], MD;  Location: [place];  Service: Vascular;  Laterality: Right;  . Amputation Right 09/28/2012    Procedure: AMPUTATION ABOVE KNEE;  Surgeon: Conrad [place], MD;  Location: [place];  Service: Vascular;  Laterality: Right;  . Ganglion cyst excision  Right 08/16/2013    Procedure: REMOVAL GANGLION cyst  RIGHT WRIST;  Surgeon: Marianna Payment, MD;  Location: [place];  Service: Orthopedics;  Laterality: Right;  . Abdominal aortagram N/A 05/12/2012    Procedure: ABDOMINAL AORTAGRAM;  Surgeon: Conrad [place], MD;  [facility] C[facility] LAB;  Service: Cardiovascular;  Laterality: N/A;  . Lower extremity angiogram  05/12/2012    Procedure: LOWER EXTREMITY ANGIOGRAM;  Surgeon:  Conrad Reasnor, MD;  Location: [facility] CATH LAB;  Service: Cardiovascular;;  . Colonoscopy      Allergies  Allergen Reactions  . Codeine Nausea And Vomiting    Current Outpatient Prescriptions  Medication Sig Dispense Refill  . acetaminophen (TYLENOL) 500 MG tablet Take 1,000 mg by mouth daily.     [name] albuterol (PROVENTIL HFA;VENTOLIN HFA) 108 (90 BASE) MCG/ACT inhaler Inhale 1-2 puffs into the lungs every 4 (four) hours as needed for wheezing or shortness of breath.    [name] atorvastatin (LIPITOR) 40 MG tablet Take 40 mg by mouth daily.    . Blood Glucose Monitoring Suppl (ACCU-CHEK AVIVA PLUS) w/Device KIT 1 Device by Does not apply route 3 (three) times daily after meals. E11.65 1 kit 0  . cetirizine (ZYRTEC) 10 MG tablet Take 1 tablet (10 mg total) by mouth daily. 30 tablet 11  . clopidogrel (PLAVIX) 75 MG tablet Take 1 tablet (75 mg total) by mouth daily with breakfast. 30 tablet 6  . diltiazem (CARDIZEM CD) 240 MG 24 hr capsule Take 1 capsule (240 mg total) by mouth daily. 30 capsule 5  . docusate sodium (COLACE) 100 MG capsule Take 100 mg by mouth daily.     [name] exenatide (BYETTA 5 MCG PEN) 5 MCG/0.02ML SOPN injection Inject 0.02 mLs (5 mcg total) into the skin 2 (two) times daily with a meal. 1.2 mL 5  . furosemide (LASIX) 40 MG tablet Take 1 tablet (40 mg total) by mouth daily. 30 tablet 3  . gabapentin (NEURONTIN) 300 MG capsule Take 1 capsule (300 mg total) by mouth 3 (three) times daily. 90 capsule 5  . glimepiride (AMARYL) 4 MG tablet Take 1 tablet (4 mg total) by mouth daily with supper. 60 tablet 11  . glucose blood (ACCU-CHEK AVIVA PLUS) test strip 1 each by Other route 3 (three) times daily. E11.65 100 each 12  . ibuprofen (ADVIL,MOTRIN) 400 MG tablet Take 1 tablet (400 mg total) by mouth every 8 (eight) hours as needed for moderate pain (with food for 5 days). 15 tablet 0  . insulin lispro (HUMALOG) 100 UNIT/ML injection Inject 0.15 mLs (15 Units total) into the skin 3 (three) times  daily with meals. 10 mL 5  . Insulin Pen Needle (B-D ULTRAFINE III SHORT PEN) 31G X 8 MM MISC 1 application by Does not apply route 2 (two) times daily. 100 each 3  . latanoprost (XALATAN) 0.005 % ophthalmic solution Place 1 drop into both eyes daily. 2.5 mL 0  . lisinopril (PRINIVIL,ZESTRIL) 40 MG tablet Take 1 tablet (40 mg total) by mouth daily. 30 tablet 5  . metFORMIN (GLUCOPHAGE XR) 500 MG 24 hr tablet Take 4 tablets (2,000 mg total) by mouth daily with breakfast. 120 tablet 3  . potassium chloride SA (K-DUR,KLOR-CON) 20 MEQ tablet Take 1 tablet (20 mEq total) by mouth daily. 30 tablet 3  . sildenafil (VIAGRA) 100 MG tablet Take 0.5-1 tablets (50-100 mg total) by mouth daily as needed for erectile dysfunction. 5 tablet 11  . TRUEPLUS LANCETS 28G MISC 1 each  by Does not apply route 3 (three) times daily. 100 each 11   No current facility-administered medications for this visit.    ROS: See HPI for pertinent positives and negatives.   Physical Examination  Filed Vitals:   05/03/15 1311  BP: 157/99  Pulse: 81  Temp: 98.2 F (36.8 C)  TempSrc: Oral  Resp: 16  SpO2: 96%   There is no weight on file to calculate BMI.    General: A&O x 3, WDWN, obese male, seated in his wheelchair  Pulmonary: Sym exp, diminished air movt, CTAB, no rales, rhonchi, & wheezing   Cardiac: RRR, Nl S1, S2, no detected murmur  Vascular:  Vessel  Right  Left   Radial  Palpable  Palpable   Brachial  Palpable  Palpable   Carotid  Palpable, without bruit  Palpable, without bruit   Aorta  Not palpable  N/A   Femoral  Not Palpable, pt sitting in wheelchair and is obese  Not Palpable, pt sitting in wheelchair and is obese  Popliteal  AKA  Not palpable   PT  AKA  Not palpable, not audible with Doppler   DP  AKA  1+ palpable, + Doppler signal  peroneal AKA Not  palpable, + Doppler signal   Gastrointestinal: soft, NTND, -G/R, - HSM, - palpable masses, - CVAT B   Musculoskeletal: M/S 5/5 throughout , R AKA with no evidence of wounds or ulcers, left foot with no signs of ischemia, no gangrene or ulcers , no sensation in foot, scant hair growth on left great toe and left 4th toe.1+ non pitting edema in left lower leg and foot.  Neurologic: Pain and light touch intact in extremities except R AKA and L foot, Motor exam as listed above                               Non-Invasive Vascular Imaging: DATE: 05/03/2015  None   ASSESSMENT: QUANTAVIUS HUMM is a 55 y.o. male who is s/p R SFA to peroneal bypass, PTA distal anastomosis, R AKA.  His atherosclerotic risk factors include uncontrolled DM until recently, former smoker (quit 2014), and obesity. He has diabetic neuropathy in his left foot but no signs of ischemia.  His uses his right AKA prosthesis, but his walking is limited by right shoulder and right leg pain that were found to be associated with spinal issues; he hopes to have surgery to correct this soon since his DM has recently come under control. He then hopes to walk more without the pain in his right shoulder and right leg/stump. He has a 1+ palpable left DP pulse and there is a + Doppler signal at his left peroneal artery, no Doppler signal in his left PT artery.   His insurance denied coverage of left ABI today, will retry in a month. If he is having spine surgery or recovering from this in a month, will reschedule, he will let us know.    PLAN:  Daily seated left leg exercises discussed and demonstrated.  Based on the patient's vascular studies and examination, pt will return to clinic in 1 month with left ABI if his insurance will cover.  I discussed in depth with the patient the nature of atherosclerosis, and emphasized the importance of maximal medical management including strict control of blood pressure, blood  glucose, and lipid levels, obtaining regular exercise, and continued cessation of smoking.  The patient is aware that without  maximal medical management the underlying atherosclerotic disease process will progress, limiting the benefit of any interventions.  The patient was given information about PAD including signs, symptoms, treatment, what symptoms should prompt the patient to seek immediate medical care, and risk reduction measures to take.  Clemon Chambers, RN, MSN, FNP-C [facility] Phone: [phone]  Clinic MD: Scot Dock  05/03/2015 1:33 PM

## 2015-05-07 ENCOUNTER — Other Ambulatory Visit: Payer: Self-pay | Admitting: *Deleted

## 2015-05-07 DIAGNOSIS — I739 Peripheral vascular disease, unspecified: Secondary | ICD-10-CM

## 2015-05-08 ENCOUNTER — Encounter: Payer: Self-pay | Admitting: Family

## 2015-05-10 ENCOUNTER — Encounter: Payer: Self-pay | Admitting: Family

## 2015-05-10 ENCOUNTER — Ambulatory Visit (HOSPITAL_COMMUNITY)
Admission: RE | Admit: 2015-05-10 | Discharge: 2015-05-10 | Disposition: A | Discharge status: Home / Self Care | Payer: Medicaid Other | Source: Ambulatory Visit | Attending: Vascular Surgery | Admitting: Vascular Surgery

## 2015-05-10 ENCOUNTER — Ambulatory Visit (INDEPENDENT_AMBULATORY_CARE_PROVIDER_SITE_OTHER): Payer: Medicaid Other | Admitting: Family

## 2015-05-10 VITALS — BP 176/99 | HR 75 | Ht 72.0 in | Wt 240.0 lb

## 2015-05-10 DIAGNOSIS — Z720 Tobacco use: Secondary | ICD-10-CM | POA: Diagnosis not present

## 2015-05-10 DIAGNOSIS — E1151 Type 2 diabetes mellitus with diabetic peripheral angiopathy without gangrene: Secondary | ICD-10-CM

## 2015-05-10 DIAGNOSIS — I779 Disorder of arteries and arterioles, unspecified: Secondary | ICD-10-CM

## 2015-05-10 DIAGNOSIS — R938 Abnormal findings on diagnostic imaging of other specified body structures: Secondary | ICD-10-CM | POA: Diagnosis not present

## 2015-05-10 DIAGNOSIS — E785 Hyperlipidemia, unspecified: Secondary | ICD-10-CM | POA: Insufficient documentation

## 2015-05-10 DIAGNOSIS — E1142 Type 2 diabetes mellitus with diabetic polyneuropathy: Secondary | ICD-10-CM

## 2015-05-10 DIAGNOSIS — I10 Essential (primary) hypertension: Secondary | ICD-10-CM | POA: Diagnosis not present

## 2015-05-10 DIAGNOSIS — I739 Peripheral vascular disease, unspecified: Secondary | ICD-10-CM | POA: Insufficient documentation

## 2015-05-10 DIAGNOSIS — R0989 Other specified symptoms and signs involving the circulatory and respiratory systems: Secondary | ICD-10-CM | POA: Diagnosis present

## 2015-05-10 DIAGNOSIS — G5792 Unspecified mononeuropathy of left lower limb: Secondary | ICD-10-CM

## 2015-05-10 DIAGNOSIS — E119 Type 2 diabetes mellitus without complications: Secondary | ICD-10-CM | POA: Diagnosis not present

## 2015-05-10 DIAGNOSIS — M792 Neuralgia and neuritis, unspecified: Secondary | ICD-10-CM

## 2015-05-10 DIAGNOSIS — K219 Gastro-esophageal reflux disease without esophagitis: Secondary | ICD-10-CM | POA: Insufficient documentation

## 2015-05-10 DIAGNOSIS — Z95828 Presence of other vascular implants and grafts: Secondary | ICD-10-CM

## 2015-05-10 DIAGNOSIS — Z89611 Acquired absence of right leg above knee: Secondary | ICD-10-CM

## 2015-05-10 DIAGNOSIS — I70219 Atherosclerosis of native arteries of extremities with intermittent claudication, unspecified extremity: Secondary | ICD-10-CM

## 2015-05-10 DIAGNOSIS — Z87891 Personal history of nicotine dependence: Secondary | ICD-10-CM

## 2015-05-10 NOTE — Progress Notes (Signed)
[facility] [place]   CC: Follow up peripheral arterial occlusive disease  History of Present Illness Javier Quinn is a 55 y.o. male patient of Dr. Bridgett Larsson who presents with chief complaint: follow up for PAOD. Previous operation(s) completed include: failed R SFA to peroneal bypass, PTA distal anastomosis, R AKA.  He returns today as his health insurance approved ABI's, review of results and discussion. The patient's symptoms are: neuropathic pain in L foot and diffuse numbness if L foot, worse with walking  The patient's treatment regimen currently includes: maximal medical management, physical therapy has ended. He has a R AKA prosthesis but his ambulation is limited by his L foot pain with ambulation, described as pins and needles with some severe burning in his great toe  He continues to have mild swelling in his left foot/lower leg; swelling resolves overnight with left leg elevation.  The pt denies non healing wounds on his right AKA stump or left lower extremity.  He denies any history of stroke or TIA.  Pt states that his orthopedic doctor determined that his right shoulder to right leg pain is likely from a pinched nerve in his back.  The plantar surface of his left foot is painful with walking, the more he walks the more painful this becomes. This limits his walking to about 500 feet, after stopping for 5 minutes he can resume walking.   Pt smoker: former smoker, quit in 2014 DM : 01/09/15 A1C was 9.4 (average daily glucose of 223) (review of records), 7.1 A1C on 04/29/15, improved glycemic control, states he can now schedule his c-spine surgery  Pt meds include: Statin :Yes, Lipitor Betablocker: No ASA: No Other anticoagulants/antiplatelets: Plavix   Past Medical History  Diagnosis Date  . Diabetes mellitus without complication ([place])   . Hyperlipidemia   . Tobacco dependence   . GERD (gastroesophageal reflux disease)   . H/O hiatal hernia    . Peripheral vascular disease ([place])   . Hypertension     See's Dr Johnsie Cancel States   . PVD (peripheral vascular disease) with claudication ([place]) 05/18/2012  . Atherosclerotic peripheral vascular disease with intermittent claudication ([place]) 05/18/2012  . Unspecified essential hypertension 05/18/2012  . Type II or unspecified type diabetes mellitus with peripheral circulatory disorders, uncontrolled(250.72) 10/11/2012  . Dyslipidemia 08/10/2012  . Above knee amputation of right lower extremity [facility])     Social History Social History  Substance Use Topics  . Smoking status: Former Smoker -- 0.50 packs/day for 20 years    Types: Cigarettes    Quit date: 04/11/2012  . Smokeless tobacco: Former Systems developer  . Alcohol Use: No     Comment: occ    Family History Family History  Problem Relation Age of Onset  . Cancer Mother     pancreas  . Cancer Father   . Heart attack Sister     Past Surgical History  Procedure Laterality Date  . Bypass graft femoral-peroneal Right 06/15/2012    Procedure: BYPASS GRAFT [place] Stall;  Surgeon: Conrad Sebastian, MD;  Location: [facility];  Service: Vascular;  Laterality: Right;  using right non-reversed saphenous vein  . Lower extremity angiogram Right 06/15/2012    Procedure: LOWER EXTREMITY ANGIOGRAM;  Surgeon: Conrad [place], MD;  Location: [facility];  Service: Vascular;  Laterality: Right;  . Lower extremity angiogram Right 09/09/2012    Procedure: LOWER EXTREMITY ANGIOGRAM With Agioplasty Bypass Graft;  Surgeon: Conrad Sterling, MD;  Location: [facility];  Service: Vascular;  Laterality: Right;  . Amputation Right 09/28/2012    Procedure: AMPUTATION ABOVE KNEE;  Surgeon: Conrad [place], MD;  Location: [facility];  Service: Vascular;  Laterality: Right;  . Ganglion cyst excision Right 08/16/2013    Procedure: REMOVAL GANGLION cyst  RIGHT WRIST;  Surgeon: Marianna Payment, MD;  Location: [facility];  Service: Orthopedics;  Laterality: Right;  . Abdominal aortagram N/A 05/12/2012    Procedure:  ABDOMINAL AORTAGRAM;  Surgeon: Conrad Henderson, MD;  Location: [facility] CATH LAB;  Service: Cardiovascular;  Laterality: N/A;  . Lower extremity angiogram  05/12/2012    Procedure: LOWER EXTREMITY ANGIOGRAM;  Surgeon: Conrad Cove, MD;  Location: [facility] C[facility] LAB;  Service: Cardiovascular;;  . Colonoscopy      Allergies  Allergen Reactions  . Codeine Nausea And Vomiting    Current Outpatient Prescriptions  Medication Sig Dispense Refill  . acetaminophen (TYLENOL) 500 MG tablet Take 1,000 mg by mouth daily.     [name] albuterol (PROVENTIL HFA;VENTOLIN HFA) 108 (90 BASE) MCG/ACT inhaler Inhale 1-2 puffs into the lungs every 4 (four) hours as needed for wheezing or shortness of breath.    [name] atorvastatin (LIPITOR) 40 MG tablet Take 40 mg by mouth daily.    . Blood Glucose Monitoring Suppl (ACCU-CHEK AVIVA PLUS) w/Device KIT 1 Device by Does not apply route 3 (three) times daily after meals. E11.65 1 kit 0  . cetirizine (ZYRTEC) 10 MG tablet Take 1 tablet (10 mg total) by mouth daily. 30 tablet 11  . clopidogrel (PLAVIX) 75 MG tablet Take 1 tablet (75 mg total) by mouth daily with breakfast. 30 tablet 6  . diltiazem (CARDIZEM CD) 240 MG 24 hr capsule Take 1 capsule (240 mg total) by mouth daily. 30 capsule 5  . docusate sodium (COLACE) 100 MG capsule Take 100 mg by mouth daily.     [name] exenatide (BYETTA 5 MCG PEN) 5 MCG/0.02ML SOPN injection Inject 0.02 mLs (5 mcg total) into the skin 2 (two) times daily with a meal. 1.2 mL 5  . furosemide (LASIX) 40 MG tablet Take 1 tablet (40 mg total) by mouth daily. 30 tablet 3  . gabapentin (NEURONTIN) 300 MG capsule Take 1 capsule (300 mg total) by mouth 3 (three) times daily. 90 capsule 5  . glimepiride (AMARYL) 4 MG tablet Take 1 tablet (4 mg total) by mouth daily with supper. 60 tablet 11  . glucose blood (ACCU-CHEK AVIVA PLUS) test strip 1 each by Other route 3 (three) times daily. E11.65 100 each 12  . ibuprofen (ADVIL,MOTRIN) 400 MG tablet Take 1 tablet (400 mg total) by  mouth every 8 (eight) hours as needed for moderate pain (with food for 5 days). 15 tablet 0  . insulin lispro (HUMALOG) 100 UNIT/ML injection Inject 0.15 mLs (15 Units total) into the skin 3 (three) times daily with meals. 10 mL 5  . Insulin Pen Needle (B-D ULTRAFINE III SHORT PEN) 31G X 8 MM MISC 1 application by Does not apply route 2 (two) times daily. 100 each 3  . latanoprost (XALATAN) 0.005 % ophthalmic solution Place 1 drop into both eyes daily. 2.5 mL 0  . lisinopril (PRINIVIL,ZESTRIL) 40 MG tablet Take 1 tablet (40 mg total) by mouth daily. 30 tablet 5  . metFORMIN (GLUCOPHAGE XR) 500 MG 24 hr tablet Take 4 tablets (2,000 mg total) by mouth daily with breakfast. 120 tablet 3  . potassium chloride SA (K-DUR,KLOR-CON) 20 MEQ tablet Take 1 tablet (20 mEq total) by mouth daily.  30 tablet 3  . sildenafil (VIAGRA) 100 MG tablet Take 0.5-1 tablets (50-100 mg total) by mouth daily as needed for erectile dysfunction. 5 tablet 11  . TRUEPLUS LANCETS 28G MISC 1 each by Does not apply route 3 (three) times daily. 100 each 11   No current facility-administered medications for this visit.    ROS: See HPI for pertinent positives and negatives.   Physical Examination  Filed Vitals:   05/10/15 1342 05/10/15 1344  BP: 174/95 176/99  Pulse: 75   Height: 6' (1.829 m)   Weight: 240 lb (108.863 kg)   SpO2: 95%    Body mass index is 32.54 kg/(m^2).  General: A&O x 3, WDWN, obese male, seated in his wheelchair  Pulmonary: Sym exp, diminished air movt, CTAB, no rales, rhonchi, or wheezing   Cardiac: RRR, Nl S1, S2, no detected murmur  Vascular:  Vessel  Right  Left   Radial  Palpable  Palpable   Brachial  Palpable  Palpable   Carotid  Palpable, without bruit  Palpable, without bruit   Aorta  Not palpable  N/A   Femoral  Not Palpable, pt sitting in wheelchair and is obese  Not Palpable, pt  sitting in wheelchair and is obese  Popliteal  AKA  Not palpable   PT  AKA  Not palpable, not audible with Doppler   DP  AKA  1+ palpable, + Doppler signal  peroneal AKA Not palpable, + Doppler signal   Gastrointestinal: soft, NTND, -G/R, - HSM, - palpable masses, - CVAT B   Musculoskeletal: M/S 5/5 throughout , R AKA with no evidence of wounds or ulcers, left foot with no signs of ischemia, no gangrene or ulcers , no sensation in foot, scant hair growth on left great toe and left 4th toe.2+ non pitting edema in left lower leg and foot.  Neurologic: Pain and light touch intact in extremities except R AKA and L foot, Motor exam as listed above                                   Non-Invasive Vascular Imaging: DATE: 05/10/2015 ABI: RIGHT: AKA;  LEFT: 0.81 (0.84, 02/01/15), Waveforms: monophasic, TBI: 0.56, toe pressure: 84   ASSESSMENT: CHIDI SHIRER is a 55 y.o. male who is s/p R SFA to peroneal bypass, PTA distal anastomosis, R AKA.  His atherosclerotic risk factors include uncontrolled DM until recently, former smoker (quit 2014), and obesity. He has diabetic neuropathy in his left foot but no signs of ischemia.  His uses his right AKA prosthesis, but his walking is limited by right shoulder and right leg pain that were found to be associated with spinal issues; he hopes to have surgery to correct this soon since his DM has recently come under control. He then hopes to walk more without the pain in his right shoulder and right leg/stump.  Left ABI today indicates mild arterial occlusive disease with monophasic waveforms.  I advised pt to see to see his PCP or cardiologist ASAP to address his elevated blood pressure Rolling walker with seat prescription given to pt; this will allow him to walk more as he can sit when the neuropathic pain of his left foot is exacerbated by walking. Walking more is  necessary to facilitate development of collateral arterial perfusion.   PLAN:  Based on the patient's vascular studies and examination, pt will return to clinic in 9 months with  ABI's. Graduated walking program with rolling seated walker.  I discussed in depth with the patient the nature of atherosclerosis, and emphasized the importance of maximal medical management including strict control of blood pressure, blood glucose, and lipid levels, obtaining regular exercise, and continued cessation of smoking.  The patient is aware that without maximal medical management the underlying atherosclerotic disease process will progress, limiting the benefit of any interventions.  The patient was given information about PAD including signs, symptoms, treatment, what symptoms should prompt the patient to seek immediate medical care, and risk reduction measures to take.  Clemon Chambers, RN, MSN, FNP-C [facility] Phone: [phone]  Clinic MD: Bridgett Larsson  05/10/2015 1:49 PM

## 2015-05-16 ENCOUNTER — Encounter (HOSPITAL_COMMUNITY)
Admission: RE | Admit: 2015-05-16 | Discharge: 2015-05-16 | Disposition: A | Discharge status: Home / Self Care | Payer: Medicaid Other | Source: Ambulatory Visit | Attending: Specialist | Admitting: Specialist

## 2015-05-16 ENCOUNTER — Encounter (HOSPITAL_COMMUNITY): Payer: Self-pay

## 2015-05-16 DIAGNOSIS — E119 Type 2 diabetes mellitus without complications: Secondary | ICD-10-CM | POA: Insufficient documentation

## 2015-05-16 DIAGNOSIS — Z7984 Long term (current) use of oral hypoglycemic drugs: Secondary | ICD-10-CM | POA: Insufficient documentation

## 2015-05-16 DIAGNOSIS — E785 Hyperlipidemia, unspecified: Secondary | ICD-10-CM | POA: Insufficient documentation

## 2015-05-16 DIAGNOSIS — Z87891 Personal history of nicotine dependence: Secondary | ICD-10-CM | POA: Insufficient documentation

## 2015-05-16 DIAGNOSIS — Z89611 Acquired absence of right leg above knee: Secondary | ICD-10-CM | POA: Diagnosis not present

## 2015-05-16 DIAGNOSIS — Z7902 Long term (current) use of antithrombotics/antiplatelets: Secondary | ICD-10-CM | POA: Insufficient documentation

## 2015-05-16 DIAGNOSIS — I10 Essential (primary) hypertension: Secondary | ICD-10-CM | POA: Insufficient documentation

## 2015-05-16 DIAGNOSIS — Z01818 Encounter for other preprocedural examination: Secondary | ICD-10-CM | POA: Diagnosis not present

## 2015-05-16 DIAGNOSIS — K219 Gastro-esophageal reflux disease without esophagitis: Secondary | ICD-10-CM | POA: Insufficient documentation

## 2015-05-16 DIAGNOSIS — Z01812 Encounter for preprocedural laboratory examination: Secondary | ICD-10-CM | POA: Diagnosis not present

## 2015-05-16 DIAGNOSIS — M4722 Other spondylosis with radiculopathy, cervical region: Secondary | ICD-10-CM | POA: Diagnosis not present

## 2015-05-16 DIAGNOSIS — Z79899 Other long term (current) drug therapy: Secondary | ICD-10-CM | POA: Diagnosis not present

## 2015-05-16 DIAGNOSIS — Z95828 Presence of other vascular implants and grafts: Secondary | ICD-10-CM | POA: Insufficient documentation

## 2015-05-16 HISTORY — DX: Unspecified asthma, uncomplicated: J45.909

## 2015-05-16 LAB — COMPREHENSIVE METABOLIC PANEL
ALBUMIN: 4.1 g/dL (ref 3.5–5.0)
ALK PHOS: 92 U/L (ref 38–126)
ALT: 39 U/L (ref 17–63)
ANION GAP: 11 (ref 5–15)
AST: 26 U/L (ref 15–41)
BUN: 10 mg/dL (ref 6–20)
CALCIUM: 9.8 mg/dL (ref 8.9–10.3)
CO2: 22 mmol/L (ref 22–32)
CREATININE: 1.05 mg/dL (ref 0.61–1.24)
Chloride: 108 mmol/L (ref 101–111)
GFR calc Af Amer: >60 mL/min (ref >60)
GFR calc non Af Amer: >60 mL/min (ref >60)
GLUCOSE: 89 mg/dL (ref 65–99)
Potassium: 4.1 mmol/L (ref 3.5–5.1)
SODIUM: 141 mmol/L (ref 135–145)
Total Bilirubin: 0.7 mg/dL (ref 0.3–1.2)
Total Protein: 7 g/dL (ref 6.5–8.1)

## 2015-05-16 LAB — PROTIME-INR
INR: 0.83 (ref 0.00–1.49)
Prothrombin Time: 11.7 seconds (ref 11.6–15.2)

## 2015-05-16 LAB — CBC
HEMATOCRIT: 45.3 % (ref 39.0–52.0)
HEMOGLOBIN: 15.4 g/dL (ref 13.0–17.0)
MCH: 28.9 pg (ref 26.0–34.0)
MCHC: 34 g/dL (ref 30.0–36.0)
MCV: 85 fL (ref 78.0–100.0)
Platelets: 270 10*3/uL (ref 150–400)
RBC: 5.33 MIL/uL (ref 4.22–5.81)
RDW: 13.1 % (ref 11.5–15.5)
WBC: 9 10*3/uL (ref 4.0–10.5)

## 2015-05-16 LAB — URINALYSIS, ROUTINE W REFLEX MICROSCOPIC
Bilirubin Urine: NEGATIVE
Glucose, UA: NEGATIVE mg/dL
HGB URINE DIPSTICK: NEGATIVE
Ketones, ur: NEGATIVE mg/dL
Leukocytes, UA: NEGATIVE
Nitrite: NEGATIVE
PH: 5 (ref 5.0–8.0)
Protein, ur: NEGATIVE mg/dL
SPECIFIC GRAVITY, URINE: 1.021 (ref 1.005–1.030)

## 2015-05-16 LAB — APTT: APTT: 31 s (ref 24–37)

## 2015-05-16 LAB — SURGICAL PCR SCREEN
MRSA, PCR: NEGATIVE
STAPHYLOCOCCUS AUREUS: NEGATIVE

## 2015-05-16 LAB — GLUCOSE, CAPILLARY: Glucose-Capillary: 83 mg/dL (ref 65–99)

## 2015-05-16 NOTE — Pre-Procedure Instructions (Addendum)
Javier Quinn  05/16/2015      [facility] - [place], [place] - [address] [place] [address] Phone: [phone] Fax: [place], [place] [address] Phone: [phone] Fax: [phone]    Your procedure is scheduled on May 15  Report to [place] at 530 A.M.  Call this number if you have problems the morning of surgery:  [phone]   Remember:  Do not eat food or drink liquids after midnight.  Take these medicines the morning of surgery with A SIP OF WATER tylenol if needed, albuterol inhaler if needed, cetirizine (Zyrtec), diltiazem (Cardizem), gabapentin (Neurontin),  Latanoprost (Xalatan) eye drops,     Stop Plavix as directed by your Dr. Stop taking aspirin, Ibuprofen, Advil, Motrin, Aleve, Herbal medications, Fish Oil, vitamins, BC's, Goody's Bring your inhalers with you on the day of surgery.   How to Manage Your Diabetes Before and After Surgery  Why is it important to control my blood sugar before and after surgery? . Improving blood sugar levels before and after surgery helps healing and can limit problems. . A way of improving blood sugar control is eating a healthy diet by: o  Eating less sugar and carbohydrates o  Increasing activity/exercise o  Talking with your doctor about reaching your blood sugar goals . High blood sugars (greater than 180 mg/dL) can raise your risk of infections and slow your recovery, so you will need to focus on controlling your diabetes during the weeks before surgery. . Make sure that the doctor who takes care of your diabetes knows about your planned surgery including the date and location.  How do I manage my blood sugar before surgery? . Check your blood sugar at least 4 times a day, starting 2 days before surgery, to make sure that the level is not too high or  low. o Check your blood sugar the morning of your surgery when you wake up and every 2 hours until you get to the Short Stay unit. . If your blood sugar is less than 70 mg/dL, you will need to treat for low blood sugar: o Do not take insulin. o Treat a low blood sugar (less than 70 mg/dL) with  cup of clear juice (cranberry or apple), 4 glucose tablets, OR glucose gel. o Recheck blood sugar in 15 minutes after treatment (to make sure it is greater than 70 mg/dL). If your blood sugar is not greater than 70 mg/dL on recheck, call [phone] for further instructions. . Report your blood sugar to the short stay nurse when you get to Short Stay.  . If you are admitted to the hospital after surgery: o Your blood sugar will be checked by the staff and you will probably be given insulin after surgery (instead of oral diabetes medicines) to make sure you have good blood sugar levels. o The goal for blood sugar control after surgery is 80-180 mg/dL      WHAT DO I DO ABOUT MY DIABETES MEDICATION?   [name] Do not take oral diabetes medicines (pills) the morning of surgery. Metformin (Glucophage) or glimepiride (Amaryl)    Take usual dose of Humalog insulin at meal coverage. Take usual dose of Byetta the night before surgery, but non the day of surgery.  . The day of surgery, do not take other diabetes injectables, including Byetta (exenatide), Bydureon (  exenatide ER), Victoza (liraglutide), or Trulicity (dulaglutide).  . If your CBG is greater than 220 mg/dL, you may take  of your sliding scale (correction) dose of insulin.  Other Instructions:          Patient Signature:  Date:   Nurse Signature:  Date:   Reviewed and Endorsed by [facility] Patient Education Committee, August 2015  Do not wear jewelry, make-up or nail polish.  Do not wear lotions, powders, or perfumes.  You may wear deodorant.  Do not shave 48 hours prior to surgery.  Men may shave face and neck.  Do not bring valuables  to the hospital.  [facility] is not responsible for any belongings or valuables.  Contacts, dentures or bridgework may not be worn into surgery.  Leave your suitcase in the car.  After surgery it may be brought to your room.  For patients admitted to the hospital, discharge time will be determined by your treatment team.  Patients discharged the day of surgery will not be allowed to drive home.    Special instructions:  [place] - Preparing for Surgery  Before surgery, you can play an important role.  Because skin is not sterile, your skin needs to be as free of germs as possible.  You can reduce the number of germs on you skin by washing with CHG (chlorahexidine gluconate) soap before surgery.  CHG is an antiseptic cleaner which kills germs and bonds with the skin to continue killing germs even after washing.  Please DO NOT use if you have an allergy to CHG or antibacterial soaps.  If your skin becomes reddened/irritated stop using the CHG and inform your nurse when you arrive at Short Stay.  Do not shave (including legs and underarms) for at least 48 hours prior to the first CHG shower.  You may shave your face.  Please follow these instructions carefully:   1.  Shower with CHG Soap the night before surgery and the                                morning of Surgery.  2.  If you choose to wash your hair, wash your hair first as usual with your       normal shampoo.  3.  After you shampoo, rinse your hair and body thoroughly to remove the                      Shampoo.  4.  Use CHG as you would any other liquid soap.  You can apply chg directly       to the skin and wash gently with scrungie or a clean washcloth.  5.  Apply the CHG Soap to your body ONLY FROM THE NECK DOWN.        Do not use on open wounds or open sores.  Avoid contact with your eyes,       ears, mouth and genitals (private parts).  Wash genitals (private parts)       with your normal soap.  6.  Wash thoroughly, paying  special attention to the area where your surgery        will be performed.  7.  Thoroughly rinse your body with warm water from the neck down.  8.  DO NOT shower/wash with your normal soap after using and rinsing off       the CHG Soap.  9.  Pat yourself dry with a clean towel.            10.  Wear clean pajamas.            11.  Place clean sheets on your bed the night of your first shower and do not        sleep with pets.  Day of Surgery  Do not apply any lotions/deoderants the morning of surgery.  Please wear clean clothes to the hospital/surgery center.     Please read over the following fact sheets that you were given. Pain Booklet, Coughing and Deep Breathing, MRSA Information and Surgical Site Infection Prevention, Incentive spirometry

## 2015-05-16 NOTE — Progress Notes (Signed)
PCP is Dr. Boykin Nearing Cardiologist is Dr. Johnsie Cancel- pt states he is due to see him in July of this year. Reports his fasting CBG's run around 176 Last A1c was 7.1 done 04-29-15 Stress test noted from 05-27-12 CXR noted from 05-01-15-  reports he gets congested and coughs when he lies down at night, but is no worse since the CXR was done("about the same") Reports last dose of Plavix was 05-15-15

## 2015-05-17 NOTE — Progress Notes (Signed)
Anesthesia chart review: Patient is a 55 year old male scheduled for right C4-5 foraminotomy on 05/20/15 01/11/15 by Dr. Louanne Skye. Procedure was initially scheduled for 01/11/15, but was postponed to get his DM better controlled (see my 01/10/15 note).  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 Bridgett Larsson), hyperlipidemia, GERD, hiatal hernia.BMI 32.   PCP is Dr. Boykin Nearing, last visit 04/29/15.  She has been working with his DM management with recent A1c (POCT) down to 7.1.   Cardiologist is Dr. Johnsie Cancel who gave cardiac clearance for this procedure with permission to hold Plavix for 5 days preoperatively. Last dose 05/15/15.   Meds include Plavix, albuterol, Lipitor, diltiazem, Xalatan ophthalmic, Lasix, Neurontin, Humalog, lisinopril, metformin, Viagra, Byetta, Amaryl, KCl.   PAT Vitals: BP 171/85, HR 73, RR 18, T 36.6C, O2 sat 95%.  05/26/12 Nuclear stress test: Probable normal perfusion and mild soft tissue attenuation.  LV Ejection Fraction: 50%. LV Wall Motion: Overall normal wall thickening. LVEF appears greater than calculated. By notes, Dr. Johnsie Cancel felt it was a "Normal myovue study with no evidence of ischemia or infarction."  01/18/15 EKG: NSR, possible inferior infarct (old). Non-specific T wave abnormality (most notable in lateral leads). No significant change when compared to 10/04/14 EKG.   05/01/15 [id number]: Stable cardiomediastinal silhouette with normal heart size. No pneumothorax. No pleural effusion. No pulmonary edema. Mild  bibasilar atelectasis. Otherwise clear lungs.  IMPRESSION: Mild bibasilar atelectasis.   Preoperative labs noted. 04/29/15 A1c 7.1 (down from 9.4 since 01/09/15).   If no acute changes then I would anticipate that he can proceed as planned.  George Hugh [facility] Short Stay Center/Anesthesiology Phone [phone] 05/17/2015 10:47 AM

## 2015-05-18 NOTE — H&P (Signed)
Javier Quinn is an 55 y.o. male.   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  Failed conservative treatment.    Past Medical History  Diagnosis Date  . Diabetes mellitus without complication ([place])   . Hyperlipidemia   . Tobacco dependence   . GERD (gastroesophageal reflux disease)   . H/O hiatal hernia   . Peripheral vascular disease ([place])   . Hypertension     See's Dr Johnsie Cancel States   . PVD (peripheral vascular disease) with claudication ([place]) 05/18/2012  . Atherosclerotic peripheral vascular disease with intermittent claudication ([place]) 05/18/2012  . Unspecified essential hypertension 05/18/2012  . Type II or unspecified type diabetes mellitus with peripheral circulatory disorders, uncontrolled(250.72) 10/11/2012  . Dyslipidemia 08/10/2012  . Above knee amputation of right lower extremity ([place])   . Asthma     Past Surgical History  Procedure Laterality Date  . Bypass graft femoral-peroneal Right 06/15/2012    Procedure: BYPASS GRAFT [place] Stall;  Surgeon: Conrad White Sands, MD;  Location: [place];  Service: Vascular;  Laterality: Right;  using right non-reversed saphenous vein  . Lower extremity angiogram Right 06/15/2012    Procedure: LOWER EXTREMITY ANGIOGRAM;  Surgeon: Conrad Boonville, MD;  Location: [place];  Service: Vascular;  Laterality: Right;  . Lower extremity angiogram Right 09/09/2012    Procedure: LOWER EXTREMITY ANGIOGRAM With Agioplasty Bypass Graft;  Surgeon: Conrad Jemez Pueblo, MD;  Location: [place];  Service: Vascular;  Laterality: Right;  . Amputation Right 09/28/2012    Procedure: AMPUTATION ABOVE KNEE;  Surgeon: Conrad Stonegate, MD;  Location: [place];  Service: Vascular;  Laterality: Right;  . Ganglion cyst excision Right 08/16/2013    Procedure: REMOVAL GANGLION cyst  RIGHT WRIST;  Surgeon: Marianna Payment, MD;  Location: [place];  Service: Orthopedics;  Laterality: Right;  .  Abdominal aortagram N/A 05/12/2012    Procedure: ABDOMINAL AORTAGRAM;  Surgeon: Conrad Ironton, MD;  Location: [facility] CATH LAB;  Service: Cardiovascular;  Laterality: N/A;  . Lower extremity angiogram  05/12/2012    Procedure: LOWER EXTREMITY ANGIOGRAM;  Surgeon: Conrad Crab Orchard, MD;  Location: [facility] CATH LAB;  Service: Cardiovascular;;  . Colonoscopy      Family History  Problem Relation Age of Onset  . Cancer Mother     pancreas  . Cancer Father   . Heart attack Sister    Social History:  reports that he quit smoking about 3 years ago. His smoking use included Cigarettes. He has a 10 pack-year smoking history. He has quit using smokeless tobacco. He reports that he does not drink alcohol or use illicit drugs.  Allergies:  Allergies  Allergen Reactions  . Codeine Nausea And Vomiting    No prescriptions prior to admission    No results found for this or any previous visit (from the past 48 hour(s)). No results found.  Review of Systems  Constitutional: Negative.   Eyes: Negative.   Respiratory: Negative.   Cardiovascular: Negative.   Gastrointestinal: Negative.   Genitourinary: Negative.   Musculoskeletal: Positive for neck pain.  Skin: Negative.   Psychiatric/Behavioral: Negative.     There were no vitals taken for this visit. Physical Exam  Constitutional: He is oriented to person, place, and time. No distress.  HENT:  Head: Atraumatic.  Eyes: EOM are normal.  Cardiovascular: Normal rate.   Respiratory: No respiratory distress.  GI: He exhibits no  distension.  Musculoskeletal: He exhibits tenderness.  Lymphadenopathy:    He has no cervical adenopathy.  Neurological: He is alert and oriented to person, place, and time.  Skin: Skin is warm and dry.  Psychiatric: He has a normal mood and affect.      EXAM: MRI CERVICAL SPINE WITHOUT CONTRAST  TECHNIQUE: Multiplanar, multisequence MR imaging of the cervical spine was performed. No intravenous contrast was  administered.  COMPARISON: None.  FINDINGS: There is mild reversal of the normal cervical lordosis. There is minimal retrolisthesis of C4 on C5 and C5 on C6. Vertebral bone marrow signal is mildly to moderately diminished diffusely. No vertebral marrow edema is seen. Mild Modic type 2 degenerative marrow changes are present at C5-6. Disc space narrowing is mild at C4-5 and mild to moderate at C5-6. Subcentimeter foci of T2 hyperintensity partially visualized in the inferior right and possibly left cerebellar hemispheres are suggestive of chronic infarcts. Cervical spinal cord is normal in caliber and signal. Paraspinal soft tissues are unremarkable.  C2-3: Mild right facet arthrosis and minimal left uncovertebral spurring without significant stenosis.  C3-4: Minimal left uncovertebral spurring results in minimal left neural foraminal stenosis without spinal stenosis.  C4-5: Disc bulging and uncovertebral spurring result in moderate right greater than left neural foraminal stenosis without spinal stenosis. There is a minimal impression on ventral spinal cord.  C5-6: Left greater than right uncovertebral hypertrophy results in moderate to severe left neural foraminal stenosis without spinal stenosis.  C6-7: Mild disc bulging and left uncovertebral spurring results in mild left neural foraminal stenosis without spinal stenosis.  C7-T1: Mild right uncovertebral spurring without stenosis.  IMPRESSION: 1. Multilevel cervical disc degeneration without spinal stenosis. Moderate bilateral neural foraminal stenosis at C4-5 and moderate to severe left neural foraminal stenosis at C5-6. 2. 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 Will proceed with right C4-5 foraminotomy as scheduled.  Surgical procedure along with possible risks, complications discussed.  Also understands potential recovery time.  All  questions answered and wishes to proceed.    Lanae Crumbly, PA-C 05/18/2015, 6:31 PM Patient examined and lab reviewed with Ricard Dillon, PA-C.

## 2015-05-19 MED ORDER — CHLORHEXIDINE GLUCONATE 4 % EX LIQD: 60.0000 mL | Freq: Once | CUTANEOUS | Status: DC

## 2015-05-19 MED ORDER — CEFAZOLIN SODIUM-DEXTROSE 2-4 GM/100ML-% IV SOLN
2.0000 g | INTRAVENOUS | Status: AC
  Administered 2015-05-20: 2 g via INTRAVENOUS
  Filled 2015-05-19: qty 100

## 2015-05-19 NOTE — Anesthesia Preprocedure Evaluation (Addendum)
Anesthesia Evaluation  Patient identified by MRN, date of birth, ID band Patient awake    Reviewed: Allergy & Precautions, NPO status , Patient's Chart, lab work & pertinent test results  Airway Mallampati: II  TM Distance: >3 FB Neck ROM: Limited    Dental  (+) Edentulous Lower, Edentulous Upper, Dental Advisory Given   Pulmonary asthma , COPD,  COPD inhaler, former smoker,  Albuterol as needed   Pulmonary exam normal        Cardiovascular hypertension, Pt. on medications + Peripheral Vascular Disease  Normal cardiovascular exam     Neuro/Psych  Neuromuscular disease (phantom limb pain)    GI/Hepatic Neg liver ROS, hiatal hernia, GERD  Medicated and Controlled,  Endo/Other  diabetes, Well Controlled, Type 2, Insulin Dependent  Renal/GU negative Renal ROS     Musculoskeletal   Abdominal   Peds  Hematology negative hematology ROS (+)   Anesthesia Other Findings   Reproductive/Obstetrics                           Lab Results  Component Value Date   WBC 9.0 05/16/2015   HGB 15.4 05/16/2015   HCT 45.3 05/16/2015   MCV 85.0 05/16/2015   PLT 270 05/16/2015   Lab Results  Component Value Date   CREATININE 1.05 05/16/2015   BUN 10 05/16/2015   NA 141 05/16/2015   K 4.1 05/16/2015   CL 108 05/16/2015   CO2 22 05/16/2015    Anesthesia Physical Anesthesia Plan  ASA: III  Anesthesia Plan: General   Post-op Pain Management:    Induction: Intravenous  Airway Management Planned: Oral ETT  Additional Equipment:   Intra-op Plan:   Post-operative Plan: Extubation in OR  Informed Consent: I have reviewed the patients History and Physical, chart, labs and discussed the procedure including the risks, benefits and alternatives for the proposed anesthesia with the patient or authorized representative who has indicated his/her understanding and acceptance.   Dental advisory given  Plan  Discussed with: CRNA  Anesthesia Plan Comments:        Anesthesia Quick Evaluation

## 2015-05-20 ENCOUNTER — Ambulatory Visit (HOSPITAL_COMMUNITY): Payer: Medicaid Other

## 2015-05-20 ENCOUNTER — Observation Stay (HOSPITAL_COMMUNITY)
Admission: RE | Admit: 2015-05-20 | Discharge: 2015-05-22 | Disposition: A | Discharge status: Skilled Nursing Facility | Payer: Medicaid Other | Source: Ambulatory Visit | Attending: Specialist | Admitting: Specialist

## 2015-05-20 ENCOUNTER — Encounter (HOSPITAL_COMMUNITY): Payer: Self-pay | Admitting: *Deleted

## 2015-05-20 ENCOUNTER — Ambulatory Visit (HOSPITAL_COMMUNITY): Payer: Medicaid Other | Admitting: Vascular Surgery

## 2015-05-20 ENCOUNTER — Encounter (HOSPITAL_COMMUNITY)
Admission: RE | Disposition: A | Discharge status: Skilled Nursing Facility | Payer: Self-pay | Source: Ambulatory Visit | Attending: Specialist

## 2015-05-20 ENCOUNTER — Ambulatory Visit (HOSPITAL_COMMUNITY): Payer: Medicaid Other | Admitting: Anesthesiology

## 2015-05-20 DIAGNOSIS — E1151 Type 2 diabetes mellitus with diabetic peripheral angiopathy without gangrene: Secondary | ICD-10-CM | POA: Insufficient documentation

## 2015-05-20 DIAGNOSIS — J449 Chronic obstructive pulmonary disease, unspecified: Secondary | ICD-10-CM | POA: Insufficient documentation

## 2015-05-20 DIAGNOSIS — Z79899 Other long term (current) drug therapy: Secondary | ICD-10-CM | POA: Diagnosis not present

## 2015-05-20 DIAGNOSIS — E785 Hyperlipidemia, unspecified: Secondary | ICD-10-CM | POA: Diagnosis not present

## 2015-05-20 DIAGNOSIS — M50121 Cervical disc disorder at C4-C5 level with radiculopathy: Secondary | ICD-10-CM | POA: Diagnosis not present

## 2015-05-20 DIAGNOSIS — M5412 Radiculopathy, cervical region: Secondary | ICD-10-CM | POA: Diagnosis present

## 2015-05-20 DIAGNOSIS — Z7902 Long term (current) use of antithrombotics/antiplatelets: Secondary | ICD-10-CM | POA: Insufficient documentation

## 2015-05-20 DIAGNOSIS — K219 Gastro-esophageal reflux disease without esophagitis: Secondary | ICD-10-CM | POA: Insufficient documentation

## 2015-05-20 DIAGNOSIS — M4722 Other spondylosis with radiculopathy, cervical region: Secondary | ICD-10-CM | POA: Diagnosis present

## 2015-05-20 DIAGNOSIS — Z89611 Acquired absence of right leg above knee: Secondary | ICD-10-CM | POA: Diagnosis not present

## 2015-05-20 DIAGNOSIS — M4802 Spinal stenosis, cervical region: Secondary | ICD-10-CM | POA: Insufficient documentation

## 2015-05-20 DIAGNOSIS — Z87891 Personal history of nicotine dependence: Secondary | ICD-10-CM | POA: Insufficient documentation

## 2015-05-20 DIAGNOSIS — R339 Retention of urine, unspecified: Secondary | ICD-10-CM | POA: Insufficient documentation

## 2015-05-20 DIAGNOSIS — Z794 Long term (current) use of insulin: Secondary | ICD-10-CM | POA: Insufficient documentation

## 2015-05-20 DIAGNOSIS — Z419 Encounter for procedure for purposes other than remedying health state, unspecified: Secondary | ICD-10-CM

## 2015-05-20 DIAGNOSIS — Z95820 Peripheral vascular angioplasty status with implants and grafts: Secondary | ICD-10-CM | POA: Insufficient documentation

## 2015-05-20 DIAGNOSIS — I10 Essential (primary) hypertension: Secondary | ICD-10-CM | POA: Diagnosis not present

## 2015-05-20 DIAGNOSIS — M5010 Cervical disc disorder with radiculopathy, unspecified cervical region: Secondary | ICD-10-CM

## 2015-05-20 HISTORY — PX: POSTERIOR CERVICAL FUSION/FORAMINOTOMY: SHX5038

## 2015-05-20 LAB — GLUCOSE, CAPILLARY
GLUCOSE-CAPILLARY: 152 mg/dL — AB (ref 65–99)
GLUCOSE-CAPILLARY: 134 mg/dL — AB (ref 65–99)
GLUCOSE-CAPILLARY: 139 mg/dL — AB (ref 65–99)
GLUCOSE-CAPILLARY: 147 mg/dL — AB (ref 65–99)
Glucose-Capillary: 154 mg/dL — ABNORMAL HIGH (ref 65–99)

## 2015-05-20 SURGERY — POSTERIOR CERVICAL FUSION/FORAMINOTOMY LEVEL 1
Anesthesia: General

## 2015-05-20 MED ORDER — NEOSTIGMINE METHYLSULFATE 10 MG/10ML IV SOLN
INTRAVENOUS | Status: DC | PRN
  Administered 2015-05-20: 5 mg via INTRAVENOUS

## 2015-05-20 MED ORDER — NEOSTIGMINE METHYLSULFATE 5 MG/5ML IV SOSY
PREFILLED_SYRINGE | INTRAVENOUS | Status: AC
  Filled 2015-05-20: qty 5

## 2015-05-20 MED ORDER — FENTANYL CITRATE (PF) 250 MCG/5ML IJ SOLN
INTRAMUSCULAR | Status: AC
  Filled 2015-05-20: qty 5

## 2015-05-20 MED ORDER — EXENATIDE 5 MCG/0.02ML ~~LOC~~ SOPN: 5.0000 ug | PEN_INJECTOR | Freq: Two times a day (BID) | SUBCUTANEOUS | Status: DC

## 2015-05-20 MED ORDER — THROMBIN 20000 UNITS EX KIT
PACK | CUTANEOUS | Status: DC | PRN
  Administered 2015-05-20: 20000 [IU] via TOPICAL

## 2015-05-20 MED ORDER — LISINOPRIL 40 MG PO TABS
40.0000 mg | ORAL_TABLET | Freq: Every day | ORAL | Status: DC
  Administered 2015-05-20 – 2015-05-22 (×3): 40 mg via ORAL
  Filled 2015-05-20 (×3): qty 1

## 2015-05-20 MED ORDER — HYDROMORPHONE HCL 1 MG/ML IJ SOLN
INTRAMUSCULAR | Status: AC
  Administered 2015-05-20: 0.5 mg via INTRAVENOUS
  Filled 2015-05-20: qty 1

## 2015-05-20 MED ORDER — SODIUM CHLORIDE 0.9 % IV SOLN: 250.0000 mL | INTRAVENOUS | Status: DC

## 2015-05-20 MED ORDER — PHENOL 1.4 % MT LIQD: 1.0000 | OROMUCOSAL | Status: DC | PRN

## 2015-05-20 MED ORDER — MORPHINE SULFATE (PF) 2 MG/ML IV SOLN
1.0000 mg | INTRAVENOUS | Status: DC | PRN
  Administered 2015-05-21: 4 mg via INTRAVENOUS
  Filled 2015-05-20: qty 2

## 2015-05-20 MED ORDER — CEFAZOLIN SODIUM 1-5 GM-% IV SOLN
1.0000 g | Freq: Three times a day (TID) | INTRAVENOUS | Status: AC
  Administered 2015-05-20 (×2): 1 g via INTRAVENOUS
  Filled 2015-05-20 (×3): qty 50

## 2015-05-20 MED ORDER — BUPIVACAINE HCL (PF) 0.5 % IJ SOLN
INTRAMUSCULAR | Status: AC
  Filled 2015-05-20: qty 30

## 2015-05-20 MED ORDER — METHOCARBAMOL 1000 MG/10ML IJ SOLN
500.0000 mg | Freq: Four times a day (QID) | INTRAMUSCULAR | Status: DC | PRN
  Filled 2015-05-20: qty 5

## 2015-05-20 MED ORDER — ARTIFICIAL TEARS OP OINT
TOPICAL_OINTMENT | OPHTHALMIC | Status: AC
  Filled 2015-05-20: qty 3.5

## 2015-05-20 MED ORDER — INSULIN ASPART 100 UNIT/ML ~~LOC~~ SOLN
15.0000 [IU] | Freq: Three times a day (TID) | SUBCUTANEOUS | Status: DC
  Administered 2015-05-21 – 2015-05-22 (×6): 15 [IU] via SUBCUTANEOUS

## 2015-05-20 MED ORDER — SODIUM CHLORIDE 0.9% FLUSH
3.0000 mL | Freq: Two times a day (BID) | INTRAVENOUS | Status: DC
  Administered 2015-05-20 – 2015-05-22 (×4): 3 mL via INTRAVENOUS

## 2015-05-20 MED ORDER — LORATADINE 10 MG PO TABS
10.0000 mg | ORAL_TABLET | Freq: Every day | ORAL | Status: DC
  Administered 2015-05-20 – 2015-05-22 (×3): 10 mg via ORAL
  Filled 2015-05-20 (×3): qty 1

## 2015-05-20 MED ORDER — MIDAZOLAM HCL 5 MG/5ML IJ SOLN
INTRAMUSCULAR | Status: DC | PRN
  Administered 2015-05-20: 1 mg via INTRAVENOUS

## 2015-05-20 MED ORDER — PROPOFOL 10 MG/ML IV BOLUS
INTRAVENOUS | Status: AC
  Filled 2015-05-20: qty 40

## 2015-05-20 MED ORDER — DILTIAZEM HCL ER COATED BEADS 240 MG PO CP24
240.0000 mg | ORAL_CAPSULE | Freq: Every day | ORAL | Status: DC
  Administered 2015-05-20 – 2015-05-22 (×3): 240 mg via ORAL
  Filled 2015-05-20 (×3): qty 1

## 2015-05-20 MED ORDER — ACETAMINOPHEN 650 MG RE SUPP: 650.0000 mg | RECTAL | Status: DC | PRN

## 2015-05-20 MED ORDER — 0.9 % SODIUM CHLORIDE (POUR BTL) OPTIME
TOPICAL | Status: DC | PRN
  Administered 2015-05-20: 1000 mL

## 2015-05-20 MED ORDER — ROCURONIUM BROMIDE 100 MG/10ML IV SOLN
INTRAVENOUS | Status: DC | PRN
  Administered 2015-05-20: 50 mg via INTRAVENOUS

## 2015-05-20 MED ORDER — BUPIVACAINE HCL 0.5 % IJ SOLN
INTRAMUSCULAR | Status: DC | PRN
  Administered 2015-05-20: 30 mL

## 2015-05-20 MED ORDER — POTASSIUM CHLORIDE CRYS ER 20 MEQ PO TBCR
20.0000 meq | EXTENDED_RELEASE_TABLET | Freq: Every day | ORAL | Status: DC
  Administered 2015-05-20 – 2015-05-22 (×3): 20 meq via ORAL
  Filled 2015-05-20 (×3): qty 1

## 2015-05-20 MED ORDER — FENTANYL CITRATE (PF) 100 MCG/2ML IJ SOLN
INTRAMUSCULAR | Status: DC | PRN
  Administered 2015-05-20 (×2): 50 ug via INTRAVENOUS
  Administered 2015-05-20: 100 ug via INTRAVENOUS

## 2015-05-20 MED ORDER — POLYETHYLENE GLYCOL 3350 17 G PO PACK
17.0000 g | PACK | Freq: Every day | ORAL | Status: DC | PRN
  Administered 2015-05-21: 17 g via ORAL
  Filled 2015-05-20: qty 1

## 2015-05-20 MED ORDER — PHENYLEPHRINE HCL 10 MG/ML IJ SOLN
10.0000 mg | INTRAVENOUS | Status: DC | PRN
  Administered 2015-05-20: 20 ug/min via INTRAVENOUS

## 2015-05-20 MED ORDER — LATANOPROST 0.005 % OP SOLN
1.0000 [drp] | Freq: Every day | OPHTHALMIC | Status: DC
  Administered 2015-05-21: 1 [drp] via OPHTHALMIC
  Filled 2015-05-20 (×2): qty 2.5

## 2015-05-20 MED ORDER — FLEET ENEMA 7-19 GM/118ML RE ENEM: 1.0000 | ENEMA | Freq: Once | RECTAL | Status: DC | PRN

## 2015-05-20 MED ORDER — ACETAMINOPHEN 500 MG PO TABS: 1000.0000 mg | ORAL_TABLET | Freq: Every day | ORAL | Status: DC | PRN

## 2015-05-20 MED ORDER — THROMBIN 20000 UNITS EX KIT
PACK | CUTANEOUS | Status: AC
  Filled 2015-05-20: qty 1

## 2015-05-20 MED ORDER — LACTATED RINGERS IV SOLN
INTRAVENOUS | Status: DC | PRN
  Administered 2015-05-20 (×2): via INTRAVENOUS

## 2015-05-20 MED ORDER — SODIUM CHLORIDE 0.9 % IV SOLN: INTRAVENOUS | Status: DC

## 2015-05-20 MED ORDER — BUPIVACAINE LIPOSOME 1.3 % IJ SUSP
INTRAMUSCULAR | Status: DC | PRN
  Administered 2015-05-20: 20 mL

## 2015-05-20 MED ORDER — MIDAZOLAM HCL 2 MG/2ML IJ SOLN
INTRAMUSCULAR | Status: AC
  Filled 2015-05-20: qty 2

## 2015-05-20 MED ORDER — INSULIN ASPART 100 UNIT/ML ~~LOC~~ SOLN
0.0000 [IU] | Freq: Three times a day (TID) | SUBCUTANEOUS | Status: DC
  Administered 2015-05-21 – 2015-05-22 (×4): 3 [IU] via SUBCUTANEOUS
  Administered 2015-05-22 (×2): 5 [IU] via SUBCUTANEOUS

## 2015-05-20 MED ORDER — BISACODYL 5 MG PO TBEC: 5.0000 mg | DELAYED_RELEASE_TABLET | Freq: Every day | ORAL | Status: DC | PRN

## 2015-05-20 MED ORDER — LIDOCAINE HCL (CARDIAC) 20 MG/ML IV SOLN
INTRAVENOUS | Status: DC | PRN
  Administered 2015-05-20: 100 mg via INTRAVENOUS

## 2015-05-20 MED ORDER — OXYCODONE-ACETAMINOPHEN 5-325 MG PO TABS
1.0000 | ORAL_TABLET | ORAL | Status: DC | PRN
  Administered 2015-05-20 – 2015-05-22 (×3): 2 via ORAL
  Filled 2015-05-20 (×4): qty 2

## 2015-05-20 MED ORDER — PHENYLEPHRINE HCL 10 MG/ML IJ SOLN
INTRAMUSCULAR | Status: DC | PRN
  Administered 2015-05-20 (×3): 40 ug via INTRAVENOUS

## 2015-05-20 MED ORDER — GLYCOPYRROLATE 0.2 MG/ML IJ SOLN
INTRAMUSCULAR | Status: DC | PRN
  Administered 2015-05-20: .8 mg via INTRAVENOUS

## 2015-05-20 MED ORDER — ALBUTEROL SULFATE HFA 108 (90 BASE) MCG/ACT IN AERS
INHALATION_SPRAY | RESPIRATORY_TRACT | Status: DC | PRN
  Administered 2015-05-20: 6 via RESPIRATORY_TRACT

## 2015-05-20 MED ORDER — DOCUSATE SODIUM 100 MG PO CAPS
100.0000 mg | ORAL_CAPSULE | Freq: Two times a day (BID) | ORAL | Status: DC
  Administered 2015-05-20 – 2015-05-22 (×4): 100 mg via ORAL
  Filled 2015-05-20 (×5): qty 1

## 2015-05-20 MED ORDER — DOCUSATE SODIUM 100 MG PO CAPS: 100.0000 mg | ORAL_CAPSULE | Freq: Every day | ORAL | Status: DC

## 2015-05-20 MED ORDER — GLYCOPYRROLATE 0.2 MG/ML IV SOSY
PREFILLED_SYRINGE | INTRAVENOUS | Status: AC
  Filled 2015-05-20: qty 6

## 2015-05-20 MED ORDER — ATORVASTATIN CALCIUM 40 MG PO TABS
40.0000 mg | ORAL_TABLET | Freq: Every day | ORAL | Status: DC
  Administered 2015-05-20 – 2015-05-22 (×3): 40 mg via ORAL
  Filled 2015-05-20 (×3): qty 1

## 2015-05-20 MED ORDER — FUROSEMIDE 40 MG PO TABS
40.0000 mg | ORAL_TABLET | Freq: Every day | ORAL | Status: DC
  Administered 2015-05-20 – 2015-05-22 (×3): 40 mg via ORAL
  Filled 2015-05-20 (×3): qty 1

## 2015-05-20 MED ORDER — METHOCARBAMOL 500 MG PO TABS
500.0000 mg | ORAL_TABLET | Freq: Four times a day (QID) | ORAL | Status: DC | PRN
  Administered 2015-05-20 – 2015-05-22 (×3): 500 mg via ORAL
  Filled 2015-05-20 (×3): qty 1

## 2015-05-20 MED ORDER — CLOPIDOGREL BISULFATE 75 MG PO TABS
75.0000 mg | ORAL_TABLET | Freq: Every day | ORAL | Status: DC
  Administered 2015-05-21 – 2015-05-22 (×2): 75 mg via ORAL
  Filled 2015-05-20 (×3): qty 1

## 2015-05-20 MED ORDER — ALBUTEROL SULFATE (2.5 MG/3ML) 0.083% IN NEBU: 3.0000 mL | INHALATION_SOLUTION | RESPIRATORY_TRACT | Status: DC | PRN

## 2015-05-20 MED ORDER — ONDANSETRON HCL 4 MG/2ML IJ SOLN
INTRAMUSCULAR | Status: AC
  Filled 2015-05-20: qty 2

## 2015-05-20 MED ORDER — ROCURONIUM BROMIDE 50 MG/5ML IV SOLN
INTRAVENOUS | Status: AC
  Filled 2015-05-20: qty 1

## 2015-05-20 MED ORDER — PANTOPRAZOLE SODIUM 40 MG IV SOLR: 40.0000 mg | Freq: Every day | INTRAVENOUS | Status: DC

## 2015-05-20 MED ORDER — PANTOPRAZOLE SODIUM 40 MG PO TBEC
40.0000 mg | DELAYED_RELEASE_TABLET | Freq: Every day | ORAL | Status: DC
  Administered 2015-05-20 – 2015-05-21 (×2): 40 mg via ORAL
  Filled 2015-05-20 (×2): qty 1

## 2015-05-20 MED ORDER — ONDANSETRON HCL 4 MG/2ML IJ SOLN
INTRAMUSCULAR | Status: DC | PRN
  Administered 2015-05-20: 4 mg via INTRAVENOUS

## 2015-05-20 MED ORDER — SUCCINYLCHOLINE CHLORIDE 20 MG/ML IJ SOLN
INTRAMUSCULAR | Status: DC | PRN
  Administered 2015-05-20: 120 mg via INTRAVENOUS

## 2015-05-20 MED ORDER — LIDOCAINE 2% (20 MG/ML) 5 ML SYRINGE
INTRAMUSCULAR | Status: AC
  Filled 2015-05-20: qty 5

## 2015-05-20 MED ORDER — PROPOFOL 10 MG/ML IV BOLUS
INTRAVENOUS | Status: DC | PRN
  Administered 2015-05-20: 200 mg via INTRAVENOUS

## 2015-05-20 MED ORDER — MENTHOL 3 MG MT LOZG: 1.0000 | LOZENGE | OROMUCOSAL | Status: DC | PRN

## 2015-05-20 MED ORDER — SODIUM CHLORIDE 0.9% FLUSH: 3.0000 mL | INTRAVENOUS | Status: DC | PRN

## 2015-05-20 MED ORDER — IBUPROFEN 200 MG PO TABS: 400.0000 mg | ORAL_TABLET | Freq: Three times a day (TID) | ORAL | Status: DC | PRN

## 2015-05-20 MED ORDER — ACETAMINOPHEN 325 MG PO TABS: 650.0000 mg | ORAL_TABLET | ORAL | Status: DC | PRN

## 2015-05-20 MED ORDER — HYDROMORPHONE HCL 1 MG/ML IJ SOLN
0.2500 mg | INTRAMUSCULAR | Status: DC | PRN
  Administered 2015-05-20: 0.5 mg via INTRAVENOUS

## 2015-05-20 MED ORDER — PHENYLEPHRINE 40 MCG/ML (10ML) SYRINGE FOR IV PUSH (FOR BLOOD PRESSURE SUPPORT)
PREFILLED_SYRINGE | INTRAVENOUS | Status: AC
  Filled 2015-05-20: qty 10

## 2015-05-20 MED ORDER — ONDANSETRON HCL 4 MG/2ML IJ SOLN: 4.0000 mg | INTRAMUSCULAR | Status: DC | PRN

## 2015-05-20 MED ORDER — HYDROCODONE-ACETAMINOPHEN 5-325 MG PO TABS
1.0000 | ORAL_TABLET | ORAL | Status: DC | PRN
  Administered 2015-05-20 – 2015-05-22 (×4): 2 via ORAL
  Filled 2015-05-20 (×4): qty 2

## 2015-05-20 MED ORDER — BUPIVACAINE LIPOSOME 1.3 % IJ SUSP
20.0000 mL | Freq: Once | INTRAMUSCULAR | Status: DC
  Filled 2015-05-20: qty 20

## 2015-05-20 MED ORDER — SUCCINYLCHOLINE CHLORIDE 200 MG/10ML IV SOSY
PREFILLED_SYRINGE | INTRAVENOUS | Status: AC
  Filled 2015-05-20: qty 10

## 2015-05-20 MED ORDER — GABAPENTIN 300 MG PO CAPS
300.0000 mg | ORAL_CAPSULE | Freq: Three times a day (TID) | ORAL | Status: DC
  Administered 2015-05-20 – 2015-05-22 (×7): 300 mg via ORAL
  Filled 2015-05-20 (×7): qty 1

## 2015-05-20 SURGICAL SUPPLY — 58 items
ADH SKN CLS APL DERMABOND .7 (GAUZE/BANDAGES/DRESSINGS) ×1
BIT DRILL NEURO 2X3.1 SFT TUCH (MISCELLANEOUS) IMPLANT
BLADE SURG ROTATE 9660 (MISCELLANEOUS) IMPLANT
BUR RND FLUTED 2.5 (BURR) ×2 IMPLANT
COLLAR CERV LO CONTOUR FIRM DE (SOFTGOODS) ×3 IMPLANT
COVER SURGICAL LIGHT HANDLE (MISCELLANEOUS) ×3 IMPLANT
DERMABOND ADVANCED (GAUZE/BANDAGES/DRESSINGS) ×2
DERMABOND ADVANCED .7 DNX12 (GAUZE/BANDAGES/DRESSINGS) ×1 IMPLANT
DRAPE C-ARM 42X72 X-RAY (DRAPES) ×3 IMPLANT
DRAPE LAPAROTOMY T 102X78X121 (DRAPES) ×3 IMPLANT
DRAPE MICROSCOPE LEICA (MISCELLANEOUS) ×2 IMPLANT
DRAPE PROXIMA HALF (DRAPES) ×6 IMPLANT
DRAPE SURG 17X23 STRL (DRAPES) ×12 IMPLANT
DRILL NEURO 2X3.1 SOFT TOUCH (MISCELLANEOUS)
DRSG MEPILEX BORDER 4X4 (GAUZE/BANDAGES/DRESSINGS) ×2 IMPLANT
DRSG MEPILEX BORDER 4X8 (GAUZE/BANDAGES/DRESSINGS) IMPLANT
DRSG PAD ABDOMINAL 8X10 ST (GAUZE/BANDAGES/DRESSINGS) ×2 IMPLANT
DURAPREP 6ML APPLICATOR 50/CS (WOUND CARE) ×3 IMPLANT
ELECT BLADE 4.0 EZ CLEAN MEGAD (MISCELLANEOUS) ×3
ELECT CAUTERY BLADE 6.4 (BLADE) ×3 IMPLANT
ELECT REM PT RETURN 9FT ADLT (ELECTROSURGICAL) ×3
ELECTRODE BLDE 4.0 EZ CLN MEGD (MISCELLANEOUS) IMPLANT
ELECTRODE REM PT RTRN 9FT ADLT (ELECTROSURGICAL) ×1 IMPLANT
EVACUATOR 1/8 PVC DRAIN (DRAIN) IMPLANT
GAUZE SPONGE 4X4 12PLY STRL (GAUZE/BANDAGES/DRESSINGS) ×1 IMPLANT
GLOVE BIOGEL PI IND STRL 8 (GLOVE) ×1 IMPLANT
GLOVE BIOGEL PI INDICATOR 8 (GLOVE) ×6
GLOVE ECLIPSE 9.0 STRL (GLOVE) ×5 IMPLANT
GLOVE ORTHO TXT STRL SZ7.5 (GLOVE) ×5 IMPLANT
GLOVE SURG 8.5 LATEX PF (GLOVE) ×5 IMPLANT
GOWN STRL REUS W/ TWL LRG LVL3 (GOWN DISPOSABLE) ×1 IMPLANT
GOWN STRL REUS W/TWL 2XL LVL3 (GOWN DISPOSABLE) ×6 IMPLANT
GOWN STRL REUS W/TWL LRG LVL3 (GOWN DISPOSABLE) ×3
KIT BASIN OR (CUSTOM PROCEDURE TRAY) ×3 IMPLANT
KIT ROOM TURNOVER OR (KITS) ×3 IMPLANT
MATRIX HEMOSTAT SURGIFLO (HEMOSTASIS) ×2 IMPLANT
NDL SPNL 18GX3.5 QUINCKE PK (NEEDLE) ×1 IMPLANT
NEEDLE SPNL 18GX3.5 QUINCKE PK (NEEDLE) ×3 IMPLANT
NS IRRIG 1000ML POUR BTL (IV SOLUTION) ×3 IMPLANT
PACK ORTHO CERVICAL (CUSTOM PROCEDURE TRAY) ×3 IMPLANT
PAD ARMBOARD 7.5X6 YLW CONV (MISCELLANEOUS) ×6 IMPLANT
PATTIES SURGICAL .25X.25 (GAUZE/BANDAGES/DRESSINGS) ×3 IMPLANT
PATTIES SURGICAL .75X.75 (GAUZE/BANDAGES/DRESSINGS) IMPLANT
SPONGE SURGIFOAM ABS GEL 100 (HEMOSTASIS) ×3 IMPLANT
SUT ETHIBOND CT1 BRD #0 30IN (SUTURE) IMPLANT
SUT ETHIBOND NAB CT1 #1 30IN (SUTURE) ×2 IMPLANT
SUT VIC AB 0 CT1 27 (SUTURE)
SUT VIC AB 0 CT1 27XBRD ANBCTR (SUTURE) IMPLANT
SUT VIC AB 2-0 CT1 27 (SUTURE)
SUT VIC AB 2-0 CT1 TAPERPNT 27 (SUTURE) IMPLANT
SUT VIC AB 2-0 UR6 27 (SUTURE) IMPLANT
SUT VIC AB 3-0 X1 27 (SUTURE) ×3 IMPLANT
SUT VICRYL 0 UR6 27IN ABS (SUTURE) ×3 IMPLANT
TAPE CLOTH 4X10 WHT NS (GAUZE/BANDAGES/DRESSINGS) ×3 IMPLANT
TOWEL OR 17X24 6PK STRL BLUE (TOWEL DISPOSABLE) ×3 IMPLANT
TOWEL OR 17X26 10 PK STRL BLUE (TOWEL DISPOSABLE) ×3 IMPLANT
TRAY FOLEY CATH 16FRSI W/METER (SET/KITS/TRAYS/PACK) IMPLANT
WATER STERILE IRR 1000ML POUR (IV SOLUTION) ×3 IMPLANT

## 2015-05-20 NOTE — Brief Op Note (Signed)
PATIENT ID:      Javier Quinn  MRN:     [id number] DOB/AGE:    Oct 22, 1960 / 55 y.o.       OPERATIVE REPORT   DATE OF PROCEDURE:  05/20/2015      PREOPERATIVE DIAGNOSIS:   Right C5 radiculopathy due to spondylosis                                                       Body mass index is 32.41 kg/(m^2).    POSTOPERATIVE DIAGNOSIS:   Right C5 radiculopathy due to spondylosis                                                                     Body mass index is 32.41 kg/(m^2).    PROCEDURE:  Procedure(s): RIGHT C4-5 FORAMINOTOMY    SURGEON: NITKA,JAMES E   ASSISTANT: Esaw Grandchild          ANESTHESIA:  General and supplemented with local marcaine 0.5% 1:1 exparel total of 20 cc, [name] CRNFA  [id number]  COMPLICATIONS:  None   CONDITION:  Stable,, extubated and in satisfactory condition.  FINDINGS: Right C4-5 Foramenal stenosis with C5 nerve compression.  NITKA,JAMES E 05/20/2015, 9:55 AM

## 2015-05-20 NOTE — Evaluation (Signed)
Physical Therapy Evaluation Patient Details Name: Javier Quinn MRN: [id number] DOB: 1960-10-19 Today's Date: 05/20/2015   History of Present Illness  55 year old male s/p C4-5 foraminotomy on 05/20/15, pmh hypertension, diabetes mellitus type 2, PAD s/p right SFA-peroneal BPG 06/2012 and s/p right AKA 09/2012 (Dr. Bridgett Larsson), hyperlipidemia, GERD, hiatal hernia  Clinical Impression  Patient demonstrates deficits in functional mobility as indicated below. Will need continued skilled PT to address deficits and maximize function. Will see as indicated and progress as tolerated. Recommend HHPT and increased assist [facility] Aide) Upon acute discharge.     Follow Up Recommendations Home health PT;Supervision for mobility/OOB, [place] Recommendations  None recommended by PT    Recommendations for Other Services       Precautions / Restrictions Precautions Precautions: Cervical Restrictions Weight Bearing Restrictions: No      Mobility  Bed Mobility Overal bed mobility: Modified Independent             General bed mobility comments: increased time to perform due to pain  Transfers Overall transfer level: Needs assistance Equipment used: None Transfers: Squat Pivot Transfers     Squat pivot transfers: Min guard     General transfer comment: no physical assist required  Ambulation/Gait             General Gait Details: no performed due to pain  Stairs            Wheelchair Mobility    Modified Rankin (Stroke Patients Only)       Balance Overall balance assessment: No apparent balance deficits (not formally assessed)                                           Pertinent Vitals/Pain Pain Assessment: 0-10 Pain Score: 10-Worst pain ever Pain Location: neck Pain Descriptors / Indicators: Operative site guarding Pain Intervention(s): Limited activity within patient's tolerance;Monitored during session;Repositioned    Home Living  Family/patient expects to be discharged to:: Private residence Living Arrangements: Alone Available Help at Discharge: Friend(s);Personal care attendant   Home Access: Level entry     Home Layout: One level Home Equipment: [place] - 2 wheels;Bedside commode;Shower seat;Tub bench;Grab bars - toilet;Grab bars - tub/shower;Wheelchair - manual      Prior Function Level of Independence: Independent with assistive device(s)               Hand Dominance   Dominant Hand: Left    Extremity/Trunk Assessment   Upper Extremity Assessment: Defer to OT evaluation           Lower Extremity Assessment:  (R AKA)         Communication   Communication: No difficulties  Cognition Arousal/Alertness: Awake/alert Behavior During Therapy: WFL for tasks assessed/performed Overall Cognitive Status: No family/caregiver present to determine baseline cognitive functioning       Memory: Decreased short-term memory              General Comments      Exercises        Assessment/Plan    PT Assessment Patient needs continued PT services  PT Diagnosis Difficulty walking;Abnormality of gait;Acute pain   PT Problem List Decreased strength;Decreased activity tolerance;Decreased balance;Decreased mobility;Decreased safety awareness;Decreased knowledge of precautions;Pain  PT Treatment Interventions DME instruction;Gait training;Functional mobility training;Therapeutic activities;Therapeutic exercise;Balance training;Patient/family education   PT Goals (Current goals can be found in the  Care Plan section) Acute Rehab PT Goals Patient Stated Goal: to go home  PT Goal Formulation: With patient Time For Goal Achievement: 05/27/15 Potential to Achieve Goals: Fair    Frequency Min 5X/week   Barriers to discharge Decreased caregiver support      Co-evaluation               End of Session Equipment Utilized During Treatment: Gait belt;Oxygen Activity Tolerance: Patient  limited by pain Patient left: in chair;with call bell/phone within reach Nurse Communication: Mobility status;Patient requests pain meds    Functional Assessment Tool Used: clinical judgement Functional Limitation: Mobility: Walking and moving around Mobility: Walking and Moving Around Current Status [id number]): At least 20 percent but less than 40 percent impaired, limited or restricted Mobility: Walking and Moving Around Goal Status [phone]): At least 1 percent but less than 20 percent impaired, limited or restricted    Time: [id number] PT Time Calculation (min) (ACUTE ONLY): 19 min   Charges:   PT Evaluation $PT Eval Moderate Complexity: 1 Procedure     PT G Codes:   PT G-Codes **NOT FOR INPATIENT CLASS** Functional Assessment Tool Used: clinical judgement Functional Limitation: Mobility: Walking and moving around Mobility: Walking and Moving Around Current Status [id number]): At least 20 percent but less than 40 percent impaired, limited or restricted Mobility: Walking and Moving Around Goal Status [phone]): At least 1 percent but less than 20 percent impaired, limited or restricted    Duncan Dull 05/20/2015, 5:31 PM Alben Deeds, [place] DPT  [phone]

## 2015-05-20 NOTE — Progress Notes (Signed)
     Subjective: Day of Surgery Procedure(s) (LRB): RIGHT C4-5 FORAMINOTOMY (N/A) Awake, alert and oriented x 4. Diabetic diet tolerated. Using ice to his neck. Moderate discomfort. Patient reports pain as moderate.    Objective:   VITALS:  Temp:  [97.6 F (36.4 C)-99.4 F (37.4 C)] 97.8 F (36.6 C) (05/15 1621) Pulse Rate:  [72-95] 94 (05/15 1621) Resp:  [14-40] 16 (05/15 1621) BP: (142-174)/(92-108) 174/108 mmHg (05/15 1621) SpO2:  [92 %-100 %] 98 % (05/15 1621) Weight:  [239 lb (108.41 kg)] 239 lb (108.41 kg) (05/15 [id number])  Neurologically intact Neurovascular intact Sensation intact distally Intact pulses distally Dorsiflexion/Plantar flexion intact Incision: scant drainage No cellulitis present   LABS No results for input(s): HGB, WBC, PLT in the last 72 hours. No results for input(s): NA, K, CL, CO2, BUN, CREATININE, GLUCOSE in the last 72 hours. No results for input(s): LABPT, INR in the last 72 hours.   Assessment/Plan: Day of Surgery Procedure(s) (LRB): RIGHT C4-5 FORAMINOTOMY (N/A)  Advance diet Up with therapy  NITKA,JAMES E 05/20/2015, 8:23 PM

## 2015-05-20 NOTE — Transfer of Care (Signed)
Immediate Anesthesia Transfer of Care Note  Patient: Javier Quinn  Procedure(s) Performed: Procedure(s): RIGHT C4-5 FORAMINOTOMY (N/A)  Patient Location: PACU  Anesthesia Type:General  Level of Consciousness: awake, sedated and patient cooperative  Airway & Oxygen Therapy: Patient Spontanous Breathing and Patient connected to face mask oxygen  Post-op Assessment: Report given to RN, Post -op Vital signs reviewed and stable and Patient moving all extremities  Post vital signs: Reviewed and stable  Last Vitals:  Filed Vitals:   05/20/15 1041 05/20/15 1042  BP:    Pulse: 91 91  Temp: 36.8 C   Resp: 17 23    Last Pain: There were no vitals filed for this visit.       Complications: No apparent anesthesia complications

## 2015-05-20 NOTE — Anesthesia Procedure Notes (Signed)
Procedure Name: Intubation Date/Time: 05/20/2015 7:42 AM Performed by: [name] B Pre-anesthesia Checklist: Patient identified, Emergency Drugs available, Suction available and Patient being monitored Patient Re-evaluated:Patient Re-evaluated prior to inductionOxygen Delivery Method: Circle system utilized Preoxygenation: Pre-oxygenation with 100% oxygen Intubation Type: IV induction Ventilation: Mask ventilation without difficulty Laryngoscope Size: Mac, 4 and Glidescope Grade View: Grade II Tube type: Oral Tube size: 7.5 mm Number of attempts: 1 Airway Equipment and Method: Video-laryngoscopy and Rigid stylet Placement Confirmation: ETT inserted through vocal cords under direct vision,  positive ETCO2 and breath sounds checked- equal and bilateral Secured at: 24 (cm at gum) cm Tube secured with: Tape Dental Injury: Teeth and Oropharynx as per pre-operative assessment

## 2015-05-20 NOTE — Progress Notes (Signed)
Orthopedic Tech Progress Note Patient Details:  Javier Quinn 11-30-1960 [id number]  Ortho Devices Type of Ortho Device: Soft collar Ortho Device/Splint Interventions: Application   [name] 05/20/2015, 3:20 PM

## 2015-05-20 NOTE — Anesthesia Postprocedure Evaluation (Signed)
Anesthesia Post Note  Patient: Javier Quinn  Procedure(s) Performed: Procedure(s) (LRB): RIGHT C4-5 FORAMINOTOMY (N/A)  Patient location during evaluation: PACU Anesthesia Type: General Level of consciousness: awake and alert Pain management: pain level controlled Vital Signs Assessment: post-procedure vital signs reviewed and stable Respiratory status: spontaneous breathing, nonlabored ventilation, respiratory function stable and patient connected to nasal cannula oxygen Cardiovascular status: blood pressure returned to baseline and stable Postop Assessment: no signs of nausea or vomiting Anesthetic complications: no    Last Vitals:  Filed Vitals:   05/20/15 1315 05/20/15 1330  BP: 148/92 142/96  Pulse: 88 89  Temp:    Resp: 16 14    Last Pain:  Filed Vitals:   05/20/15 1343  PainSc: 4                  Tiajuana Amass

## 2015-05-20 NOTE — Interval H&P Note (Signed)
Patient was seen and examined in the preop holding area. There has been no interval  Change in this patient's exam preop  history and physical exam  Lab tests and images have been examined and reviewed.  The Risks benefits and alternative treatments have been discussed  extensively,questions answered.  The patient has elected to undergo the discussed surgical treatment. 

## 2015-05-20 NOTE — Op Note (Signed)
05/20/2015  9:58 AM  PATIENT:  Javier Quinn  55 y.o. male  MRN: [id number]  OPERATIVE REPORT  PRE-OPERATIVE DIAGNOSIS:  Right C5 radiculopathy due to spondylosis  POST-OPERATIVE DIAGNOSIS:  Right C5 radiculopathy due to spondylosis  PROCEDURE:  Procedure(s): RIGHT C4-5 FORAMINOTOMY    SURGEON:  Jessy Oto, MD     ASSISTANT:  Benjiman Core, PA-C  (Present throughout the entire procedure and necessary for completion of procedure in a timely manner)     ANESTHESIA:  General,    COMPLICATIONS:  None.   PROCEDURE:The patient was met in the holding area, and the appropriate right C4-5 cervical level identified and marked with "x" and my initials. All questions were answered and informed consent signed.   The patient was then transported to OR. The patient was then placed under general anesthesia without difficulty. Transferred to the operating room table prone position Mayfield horseshoe with Oralia Manis. All pressure points well-padded PAS stockings.Shoulders taped down and skin over the posterior inferior aspect of the neck place in traction to decrease skin folds. The patient received appropriate preoperative antibiotic 2 grams ancef. prophylaxis.Time-out procedure was called and correct.   Sterile prep with DuraPrep and draped in the usual manner the shoulders were taped downwards and skin traction over the skin of the neck. Following DuraPrep draped in the usual manner. After timeout protocol. Spinal needle placed at the expected C4-5 level observed to be at the C4-5 level posterior to the spinous processes. An incision was made approximately C3 to C5 in the midline. This following infiltration of skin and subcutaneous layers with marcaine 0.5%% 1:1 exparel 1.3% total of 20 cc. Incision carried through skin and subcutaneous layers using 10 blade scalpel and electrocautery down to the level ligamentum nuchae. Incision made along the lateral aspect of the spinous process of C5. Clamp then  placed at the spinous process of C5. Cross table lateral demonstrated the clamp at the C6 level. The patient's shoulders also made identification of the spinous processed difficult. Exposure superiorly and Repeat imaging with a towel clip at the C5 level. Intraoperative C-arm fluoroscopy identified the clamp at the C5 spinous process level. Electrocautery then used to carefully incise the cervical muscles off the right lateral aspect of the spinous process of C4 and C5. Dividing the  spinous muscles off of the inferior aspect lamina at C4 exposing the C4-5 posterior aspect of the interlaminar space. C arm image then made with a Penfield #4 in the right C4-5 facet and this confirmed the correct level for the foraminotomy. The magnification headlamp were used during this portion procedure. Boss McCollough retractor was inserted. High-speed bur was used to remove a small portion of bone from the inferior aspect of lamina of C4 and the medial 20% of the inferior articular process of C5. Further thinning the superior aspect of the lamina right C5.The operating room microscope was draped sterilely and brought into the field.  A 1 mm Kerrison was then used to remove bone from superior aspect of the lamina C5 and the medial aspect of the intra-articular process of C4 the 20%, exposing the superior articular process of C5. A 1 mm Kerrison was removed bone off the medial aspect of the superior articular process of C5 resecting 20% of the medial aspect of the superior articular process of C5. Ligamentum flavum then easily lifted superiorly  electrocautery unit cauterizing epidural veins deep to the ligamentum flavum  then resecting the ligamentum flavum. Under the operating room microscope the  epidural vein layer overlying the posterior aspect of the thecal sac and the C5 nerve root was then carefully lifted using a micro-titanium nerve hook then cauterized using bipolar electrocautery a # 15 blade scalpel then used to incise  this overlying the C5 nerve root releasing the vascular leash a backward angle 3-0 microcurette then used to remove a small portion of bone off the superior and medial aspect of the pedicle further mobilizing the C5 nerve root bipolar electrocautery to control all bleeding within the axillary area of the C5 nerve. Bone wax was applied to bleeding cancellus bone surfaces are excellent hemostasis obtained  The disc explored using a Penfield 4 found not to be protruded. The uncovertebral joint found to be prominent resulting in right C5 foramenal narrowing. Following this then hemostasis was obtained using thrombin-soaked Gelfoam and micro-pledgettes. When complete hemostasis was obtained all gel foam was removed the nerve hook could be easily passed out the neuroforamen without the lateral aspect of the C5 pedicle demonstrate the C5 neuroforamen completely decompressed. Irrigation was carried out no active bleeding was present. The incision was closed by approximating the ligamentum nuchae with #1 ethibond sutures. The subcutaneous layers approximated with interrupted 0 Vicryl suture more superficial layers with interrupted 2-0 Vicryl sutures and the skin closed with interrupted 4-0 Vicryl sutures. Dermabond was applied then MedPlex bandage. Soft cervical collar placed.  All instrument and sponge counts were correct. Patient was then returned to supine position on her stretcher. Returned to recovery room in satisfactory condition.   Physician assistant's responsibilities: Benjiman Core, PA-C perform the duties of assistant physician and surgeon during this case present from the beginning of the case to the end of the case. He assisted with careful retraction of neural structures suctioning about her elements including cervical cord and C5 nerve root. Performed closure of the incision on the ligamentum nuchae to the skin and application of dressing. He assisted in positioning the patient had removal the patient from  the OR table to the stretcher.   Quiera Diffee E 05/20/2015, 9:58 AM

## 2015-05-20 NOTE — Discharge Instructions (Addendum)
° ° °  No lifting greater than 10 lbs. Avoid bending, stooping and twisting. Using wheel chair within house for first week then may start to get out slowly increasing activity using arms. Keep incision dry for 3 days, may use tegaderm or similar water impervious dressing. Avoid overhead use of arms and overhead lifting. Wear collar for comfort. Use ice as needed for comfort.  Foley to straight drain empty every shift and record volumn of output. Routine foley care. Urology, Dr. Toniann Fail, [phone] requests an appointment be made for this patient to be seen at [facility] in one week for a voiding trial. Please call and arrange this appointment.

## 2015-05-21 ENCOUNTER — Encounter (HOSPITAL_COMMUNITY): Payer: Self-pay | Admitting: Specialist

## 2015-05-21 DIAGNOSIS — R339 Retention of urine, unspecified: Secondary | ICD-10-CM | POA: Diagnosis not present

## 2015-05-21 DIAGNOSIS — M4722 Other spondylosis with radiculopathy, cervical region: Secondary | ICD-10-CM | POA: Diagnosis not present

## 2015-05-21 LAB — BASIC METABOLIC PANEL
ANION GAP: 12 (ref 5–15)
BUN: 10 mg/dL (ref 6–20)
CHLORIDE: 97 mmol/L — AB (ref 101–111)
CO2: 29 mmol/L (ref 22–32)
CREATININE: 1.06 mg/dL (ref 0.61–1.24)
Calcium: 9.3 mg/dL (ref 8.9–10.3)
GFR calc non Af Amer: >60 mL/min (ref >60)
Glucose, Bld: 173 mg/dL — ABNORMAL HIGH (ref 65–99)
Potassium: 4 mmol/L (ref 3.5–5.1)
SODIUM: 138 mmol/L (ref 135–145)

## 2015-05-21 LAB — GLUCOSE, CAPILLARY
GLUCOSE-CAPILLARY: 191 mg/dL — AB (ref 65–99)
GLUCOSE-CAPILLARY: 176 mg/dL — AB (ref 65–99)
GLUCOSE-CAPILLARY: 185 mg/dL — AB (ref 65–99)
Glucose-Capillary: 210 mg/dL — ABNORMAL HIGH (ref 65–99)

## 2015-05-21 LAB — CBC
HEMATOCRIT: 41.9 % (ref 39.0–52.0)
HEMOGLOBIN: 13.7 g/dL (ref 13.0–17.0)
MCH: 27.9 pg (ref 26.0–34.0)
MCHC: 32.7 g/dL (ref 30.0–36.0)
MCV: 85.3 fL (ref 78.0–100.0)
Platelets: 209 10*3/uL (ref 150–400)
RBC: 4.91 MIL/uL (ref 4.22–5.81)
RDW: 13.1 % (ref 11.5–15.5)
WBC: 8.9 10*3/uL (ref 4.0–10.5)

## 2015-05-21 MED ORDER — TAMSULOSIN HCL 0.4 MG PO CAPS
0.4000 mg | ORAL_CAPSULE | Freq: Every day | ORAL | Status: DC
  Administered 2015-05-21 – 2015-05-22 (×2): 0.4 mg via ORAL
  Filled 2015-05-21 (×2): qty 1

## 2015-05-21 NOTE — Progress Notes (Signed)
     Subjective: 1 Day Post-Op Procedure(s) (LRB): RIGHT C4-5 FORAMINOTOMY (N/A) Awake,alert and oriented x 4. Complains of neck pain. Post op recovery room Had foley catheter placed due to volume of bladder in excess of 500cc. Foley discontinued this AM.  Patient reports pain as marked.    Objective:   VITALS:  Temp:  [95.5 F (35.3 C)-99.4 F (37.4 C)] 95.5 F (35.3 C) (05/16 0601) Pulse Rate:  [84-95] 93 (05/16 0601) Resp:  [14-40] 18 (05/16 0601) BP: (119-174)/(69-108) 119/69 mmHg (05/16 0601) SpO2:  [92 %-100 %] 95 % (05/16 0601)  Neurologically intact ABD soft Neurovascular intact Sensation intact distally Intact pulses distally Dorsiflexion/Plantar flexion intact Incision: scant drainage   LABS  Recent Labs  05/21/15 0437  HGB 13.7  WBC 8.9  PLT 209    Recent Labs  05/21/15 0437  NA 138  K 4.0  CL 97*  CO2 29  BUN 10  CREATININE 1.06  GLUCOSE 173*   No results for input(s): LABPT, INR in the last 72 hours.   Assessment/Plan: 1 Day Post-Op Procedure(s) (LRB): RIGHT C4-5 FORAMINOTOMY (N/A) Urinary retention, foley placed in the PACU. Right rotator cuff tendinosis and right AC arthrosis. Has numerous dependencies, no assistance at home and may need SNF for short term rehab following cervical spine surgery, with right AKA and Right UE shoulder difficulties.  Advance diet Up with therapy Discharge to SNF if no progress with PT/OT.  Lennis Korb E 05/21/2015, 8:41 AM

## 2015-05-21 NOTE — Evaluation (Addendum)
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 Bridgett Larsson), hyperlipidemia, GERD, hiatal hernia   Clinical Impression   Patient presenting with decreased ADL and functional mobility independence secondary to above. Patient independent PTA, aide coming for 2hours per day to help with IADLs. Patient currently functioning at an overall min to max assist level. Patient will benefit from acute OT to increase overall independence in the areas of ADLs, functional mobility, and overall safety in order to safely discharge to venue listed below.     Follow Up Recommendations  SNF;Supervision/Assistance - 24 hour    Equipment Recommendations  Other (comment) (TBD next venue of care)    Recommendations for Other Services  None at this time   Precautions / Restrictions Precautions Precautions: Cervical Required Braces or Orthoses: Cervical Brace Cervical Brace: Soft collar;At all times Restrictions Weight Bearing Restrictions: No     Mobility Bed Mobility Overal bed mobility: Needs Assistance Bed Mobility: Supine to Sit     Supine to sit: Min assist     General bed mobility comments: Increased time and effort due to pain. Min assist needed for trunk support. Pt required cues for breathing, he tended to hold his breath.   Transfers Overall transfer level: Needs assistance Equipment used: None Transfers: Squat Pivot Transfers     Squat pivot transfers: Mod assist     General transfer comment: Mod assist due to pain. Pt took increased time and effort. Pt did not don prosthesis secondary to refusal "it's too hard to get on".     Balance Overall balance assessment: Needs assistance Sitting-balance support: No upper  extremity supported;Single extremity supported Sitting balance-Leahy Scale: Fair Sitting balance - Comments: Pt required occassional min assist while seated EOB statically    ADL Overall ADL's : Needs assistance/impaired Eating/Feeding: Sitting;Minimal assistance Eating/Feeding Details (indicate cue type and reason): Therapist had to hand him cup and provide min assist due to pain in shoulders and neck  Grooming: Minimal assistance;Sitting   Upper Body Bathing: Moderate assistance;Sitting   Lower Body Bathing: Maximal assistance;Sit to/from stand   Upper Body Dressing : Moderate assistance;Sitting   Lower Body Dressing: Maximal assistance;Sit to/from stand   Toilet Transfer: Moderate assistance;Cueing for safety Toilet Transfer Details (indicate cue type and reason): simulated EOB to recliner transfer performed  General ADL Comments: Pt limited this session due to increased pain in neck and bilateral shoulders. Pt unable to don prosthesis due to pain and eager to get into recliner for back and cervical support while sitting. Pt performed squat pivot transfer EOB to recliner with no use of AD   Vision Additional Comments: Pt kept eyes shut majority of session - due to pain?          Pertinent Vitals/Pain Pain Assessment: 0-10 Pain Score: 10-Worst pain ever Pain Location: neck Pain Descriptors / Indicators: Operative site guarding;Aching;Sore Pain Intervention(s): Limited activity within patient's tolerance;Monitored during session;Repositioned;Patient requesting pain meds-RN notified     Hand Dominance Left   Extremity/Trunk Assessment Upper Extremity Assessment Upper Extremity Assessment: Difficult to assess - increased pain and pt unable to flex arms against gravity due to pain   Lower Extremity Assessment Lower Extremity Assessment: Defer to PT evaluation       Communication Communication Communication: No difficulties   Cognition  Arousal/Alertness:  Awake/alert Behavior During Therapy: WFL for tasks assessed/performed Overall Cognitive Status: No family/caregiver present to determine baseline cognitive functioning              Home Living Family/patient expects to be discharged to:: Private residence Living Arrangements: Alone Available Help at Discharge: Friend(s);Personal care attendant Type of Home: House Home Access: Level entry     Home Layout: One level     Bathroom Shower/Tub: Tub/shower unit Shower/tub characteristics: Curtain Biochemist, clinical: Standard     Home Equipment: Environmental consultant - 2 wheels;Bedside commode;Shower seat;Tub bench;Grab bars - toilet;Grab bars - tub/shower;Wheelchair - manual   Prior Functioning/Environment Level of Independence: Independent with assistive device(s)     OT Diagnosis: Generalized weakness;Acute pain   OT Problem List: Decreased strength;Decreased range of motion;Decreased activity tolerance;Impaired balance (sitting and/or standing);Decreased coordination;Decreased safety awareness;Decreased knowledge of use of DME or AE;Decreased knowledge of precautions;Pain   OT Treatment/Interventions: Self-care/ADL training;Therapeutic exercise;Energy conservation;DME and/or AE instruction;Therapeutic activities;Patient/family education;Balance training    OT Goals(Current goals can be found in the care plan section) Acute Rehab OT Goals Patient Stated Goal: go to rehab, I don't think I can manage at home  OT Goal Formulation: With patient Time For Goal Achievement: 06/04/15 Potential to Achieve Goals: Good ADL Goals Pt Will Perform Grooming: with set-up;sitting Pt Will Perform Lower Body Bathing: with min assist;sit to/from stand Pt Will Perform Lower Body Dressing: with min assist;sit to/from stand (including donning/doffing of prosthesis) Pt Will Transfer to Toilet: with min assist;ambulating;bedside commode Pt/caregiver will Perform Home Exercise Program: Increased strength;Increased  ROM;Both right and left upper extremity;With Supervision;With written HEP provided Additional ADL Goal #1: Pt will be mod I with bed mobility consistently to decrease burden of care and as a precursor for ADLs   OT Frequency: Min 3X/week   Barriers to D/C: Decreased caregiver support   End of Session Equipment Utilized During Treatment: Gait belt;Cervical collar;Oxygen  Activity Tolerance: Patient limited by pain Patient left: in chair;with call bell/phone within reach;with family/visitor present   Time: 1000-1020 OT Time Calculation (min): 20 min Charges:  OT General Charges $OT Visit: 1 Procedure OT Evaluation $OT Eval Moderate Complexity: 1 Procedure G-Codes: OT G-codes **NOT FOR INPATIENT CLASS** Functional Limitation: Self care Self Care Current Status [id number]): At least 40 percent but less than 60 percent impaired, limited or restricted Self Care Goal Status [id number]): At least 1 percent but less than 20 percent impaired, limited or restricted  Chrys Racer , MS, OTR/L, CLT Pager: [phone]  05/21/2015, 10:36 AM

## 2015-05-21 NOTE — Progress Notes (Signed)
He has complaints of severe neck pain. Difficulty transferring from bed to bedside commode. Unable to void after removal of the foley this AM. Bladder scan with significant distention of the bladder so that a foley catheter was reinserted with 650 cc of clear urine returned. Foley left in place to SD. Therapy notes need for assistance with even transfers. No arm complaints. Motor both arms intact. Pain is primarily right scapular and posterior cervical Continues with a soft C-collar.  Diagnosis: POD#1 Right posterior cervical foraminotomy for Cervical stenosis. Muscular pain associated with posterior cervical spine surgery;  Will continue with PT/OT. Start Flomax and keep foley to SD, voiding trial in 2 days. Apparently not a candidate for home therapy, insurance will not cover.  Assess for possible SNF placement short term

## 2015-05-21 NOTE — Progress Notes (Signed)
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 Bridgett Larsson), hyperlipidemia, GERD, hiatal hernia    PT Comments    Pt remains to c/o pain which limits function and impacts patient ability to progress.  Pt required increased assistance secondary to pain.  Will inform supervising PT of need for change in recommendations for post acute rehab.  Pt perseverated on needing to urinate this session.  Pt left sitting on BSC with call bell in reach.  Nursing informed and educated on how to transfer patient back to bed.     Follow Up Recommendations  SNF;Supervision/Assistance - 24 hour     Equipment Recommendations  None recommended by PT    Recommendations for Other Services       Precautions / Restrictions Precautions Precautions: Cervical Required Braces or Orthoses: Cervical Brace Cervical Brace: Soft collar;At all times Restrictions Weight Bearing Restrictions: No    Mobility  Bed Mobility Overal bed mobility: Needs Assistance Bed Mobility: Rolling;Sidelying to Sit Rolling: Mod assist Sidelying to sit: Mod assist       General bed mobility comments: Pt required increased assist for rolling and sidelying to sit secondary to pain.  Once sitting pt require max assist to spin on bottom to allow pt to place L foot on floor to improve balance.    Transfers Overall transfer level: Needs assistance Equipment used: None Transfers: Sit to/from Stand Sit to Stand: Mod assist;Min assist         General transfer comment: Pt extremely painful and required increased assist intially to perform sit to stand from bed with use of stedy.  Pt able to follow commands for technique to improve safety and function.  Pt appears to present  with decreased strength due to sever pain.  Pt fixated during session to void and requested to sit on toilet for increased time period.    Ambulation/Gait             General Gait Details: no performed due to pain   Stairs            Wheelchair Mobility    Modified Rankin (Stroke Patients Only)       Balance Overall balance assessment: Needs assistance   Sitting balance-Leahy Scale: Fair       Standing balance-Leahy Scale: Poor                      Cognition Arousal/Alertness: Awake/alert Behavior During Therapy: WFL for tasks assessed/performed Overall Cognitive Status: Within Functional Limits for tasks assessed       Memory: Decreased short-term memory              Exercises      General Comments        Pertinent Vitals/Pain Pain Assessment: Faces Faces Pain Scale: Hurts whole lot Pain Location: neck Pain Descriptors / Indicators: Guarding;Grimacing;Operative site guarding Pain Intervention(s): Limited activity within patient's tolerance;Repositioned    Home Living                      Prior Function            PT Goals (current goals can now be found in the care plan section) Acute Rehab PT Goals Patient Stated Goal: go to rehab,  I don't think I can manage at home  Potential to Achieve Goals: Fair Progress towards PT goals: Progressing toward goals    Frequency  Min 5X/week    PT Plan Discharge plan needs to be updated    Co-evaluation             End of Session Equipment Utilized During Treatment: Gait belt Activity Tolerance: Patient limited by pain Patient left: in chair;with call bell/phone within reach     Time: [id number] PT Time Calculation (min) (ACUTE ONLY): 35 min  Charges:  $Therapeutic Activity: 23-37 mins                    G Codes:      Cristela Blue 2015/06/17, 3:59 PM

## 2015-05-21 NOTE — Progress Notes (Signed)
Pt tried several times to void with urinal placed and up on BSC without success. Standing order in place to reinsert foley cath if unable to void. # 16 Fr. With 10 cc bulb inserted without difficulty. Immediate return of 650 mls clear light yellow urine. Pt tolerated procedure well. Dr. Louanne Skye advised.

## 2015-05-22 ENCOUNTER — Other Ambulatory Visit: Payer: Self-pay | Admitting: Specialist

## 2015-05-22 DIAGNOSIS — M4722 Other spondylosis with radiculopathy, cervical region: Secondary | ICD-10-CM | POA: Diagnosis not present

## 2015-05-22 LAB — GLUCOSE, CAPILLARY
GLUCOSE-CAPILLARY: 202 mg/dL — AB (ref 65–99)
GLUCOSE-CAPILLARY: 207 mg/dL — AB (ref 65–99)
Glucose-Capillary: 184 mg/dL — ABNORMAL HIGH (ref 65–99)

## 2015-05-22 MED ORDER — CELECOXIB 200 MG PO CAPS
200.0000 mg | ORAL_CAPSULE | Freq: Two times a day (BID) | ORAL | Status: DC
  Administered 2015-05-22: 200 mg via ORAL
  Filled 2015-05-22: qty 1

## 2015-05-22 MED ORDER — METHOCARBAMOL 500 MG PO TABS: 500.0000 mg | ORAL_TABLET | Freq: Three times a day (TID) | ORAL | Status: AC | PRN

## 2015-05-22 MED ORDER — GABAPENTIN 300 MG PO CAPS: 300.0000 mg | ORAL_CAPSULE | Freq: Three times a day (TID) | ORAL | Status: DC

## 2015-05-22 MED ORDER — DOCUSATE SODIUM 100 MG PO CAPS: 100.0000 mg | ORAL_CAPSULE | Freq: Two times a day (BID) | ORAL | Status: DC

## 2015-05-22 MED ORDER — OXYCODONE-ACETAMINOPHEN 5-325 MG PO TABS: 1.0000 | ORAL_TABLET | ORAL | Status: AC | PRN

## 2015-05-22 MED ORDER — CELECOXIB 200 MG PO CAPS: 200.0000 mg | ORAL_CAPSULE | Freq: Two times a day (BID) | ORAL | Status: AC

## 2015-05-22 NOTE — Progress Notes (Signed)
     Subjective: 2 Days Post-Op Procedure(s) (LRB): RIGHT C4-5 FORAMINOTOMY (N/A)  Awake,alert and oriented x 4. Complains of right shoulder pain and right neck pain. Diabetes not good candidate for  Steroid today. Foley to SD.  Patient reports pain as moderate.    Objective:   VITALS:  Temp:  [98.4 F (36.9 C)-99.8 F (37.7 C)] 98.4 F (36.9 C) (05/17 0421) Pulse Rate:  [87-104] 101 (05/17 0421) Resp:  [18] 18 (05/17 0421) BP: (116-177)/(75-84) 177/84 mmHg (05/17 0809) SpO2:  [94 %-97 %] 97 % (05/17 0421)  Neurologically intact ABD soft Neurovascular intact Sensation intact distally Intact pulses distally Dorsiflexion/Plantar flexion intact Incision: scant drainage   LABS  Recent Labs  05/21/15 0437  HGB 13.7  WBC 8.9  PLT 209    Recent Labs  05/21/15 0437  NA 138  K 4.0  CL 97*  CO2 29  BUN 10  CREATININE 1.06  GLUCOSE 173*   No results for input(s): LABPT, INR in the last 72 hours.   Assessment/Plan: 2 Days Post-Op Procedure(s) (LRB): RIGHT C4-5 FORAMINOTOMY (N/A)  Urinary retention Right shoulder pain and weakness, C5 root and intrinsic discomfort due to right shoulder tendonitis and AC arthrosis.   Advance diet Up with therapy D/C IV fluids Discharge to SNF  Consider using celebrex for muscular joint and tendon pain.  NITKA,JAMES E 05/22/2015, 8:30 AM

## 2015-05-22 NOTE — Clinical Social Work Note (Signed)
Patient medically stable for discharge to [facility]. Per RN, RN awaiting MD signature on AVS prior to EMS transportation to be arranged.  RN to call for EMS transportation once AVS signed by MD. [id number], option 1, option 3.  Lubertha Sayres, [facility]: [phone] Surgical: [phone]

## 2015-05-22 NOTE — Clinical Social Work Placement (Signed)
   CLINICAL SOCIAL WORK PLACEMENT  NOTE  Date:  05/22/2015  Patient Details  Name: Javier Quinn MRN: [id number] Date of Birth: 04/06/1960  Clinical Social Work is seeking post-discharge placement for this patient at the [place] level of care (*CSW will initial, date and re-position this form in  chart as items are completed):  Yes   Patient/family provided with [place] Work Department's list of facilities offering this level of care within the geographic area requested by the patient (or if unable, by the patient's family).  Yes   Patient/family informed of their freedom to choose among providers that offer the needed level of care, that participate in Medicare, Medicaid or managed care program needed by the patient, have an available bed and are willing to accept the patient.  Yes   Patient/family informed of [place]'s ownership interest in [facility] and [facility], as well as of the fact that they are under no obligation to receive care at these facilities.  PASRR submitted to EDS on       PASRR number received on       Existing PASRR number confirmed on 05/22/15     FL2 transmitted to all facilities in geographic area requested by pt/family on 05/22/15     FL2 transmitted to all facilities within larger geographic area on       Patient informed that his/her managed care company has contracts with or will negotiate with certain facilities, including the following:        Yes   Patient/family informed of bed offers received.  Patient chooses bed at [facility]     Physician recommends and patient chooses bed at      Patient to be transferred to [facility] on 05/22/15.  Patient to be transferred to facility by [facility]     Patient family notified on 05/22/15 of transfer.  Name of family member notified:  Patient     PHYSICIAN       Additional Comment:    _______________________________________________ Caroline Sauger,  LCSW 05/22/2015, 2:31 PM

## 2015-05-22 NOTE — Progress Notes (Signed)
PT/OT recommending SNF. Referral made to CSW, [place] working on SNF placement . Will continue to follow for discharge needs.

## 2015-05-22 NOTE — Clinical Social Work Note (Signed)
Clinical Social Work Assessment  Patient Details  Name: Javier Quinn MRN: [id number] Date of Birth: 12-19-1960  Date of referral:  05/22/15               Reason for consult:  Facility Placement, Discharge Planning                Permission sought to share information with:  Chartered certified accountant granted to share information::  Yes, Verbal Permission Granted  Name::        Agency::  [facility] (Has been to [place] previously)  Relationship::     Contact Information:     Housing/Transportation Living arrangements for the past 2 months:  [place] of Information:  Patient Patient Interpreter Needed:  None Criminal Activity/Legal Involvement Pertinent to Current Situation/Hospitalization:  No - Comment as needed Significant Relationships:  Other(Comment) (Did not disclose) Lives with:  Self Do you feel safe going back to the place where you live?  Yes Need for family participation in patient care:  No (Coment) (Patient able to make own decisions.)  Care giving concerns:  Patient expressed no concerns at this time.   Social Worker assessment / plan:  LCSW received referral regarding SNF placement at time of discharge. LCSW met with patient to discuss discharge planning needs. Patient agreeable to SNF placement at time of discharge, and stated patient has previously completed short-term rehabilitation at [facility]. Patient to be discharged to [facility].   Employment status:  Disabled (Comment on whether or not currently receiving Disability) Insurance information:  Medicaid In [place] PT Recommendations:  [place] / Referral to community resources:  [place]  Patient/Family's Response to care:  Patient understanding and agreeable to [employer] of care.  Patient/Family's Understanding of and Emotional Response to Diagnosis, Current Treatment, and Prognosis:  Patient understanding and  agreeable to LCSW plan of care.  Emotional Assessment Appearance:  Appears stated age Attitude/Demeanor/Rapport:  Other (Appropriate) Affect (typically observed):  Accepting, Appropriate, Pleasant Orientation:  Oriented to Self, Oriented to Place, Oriented to  Time, Oriented to Situation Alcohol / Substance use:  Not Applicable Psych involvement (Current and /or in the community):  No (Comment) (Not appropriate on this admission.)  Discharge Needs  Concerns to be addressed:  No discharge needs identified Readmission within the last 30 days:  No Current discharge risk:  None Barriers to Discharge:  No Barriers Identified   Caroline Sauger, LCSW 05/22/2015, 2:28 PM [phone]

## 2015-05-22 NOTE — NC FL2 (Signed)
[place] LEVEL OF CARE SCREENING TOOL     IDENTIFICATION  Patient Name: Javier Quinn Birthdate: Nov 29, 1960 Sex: male Admission Date (Current Location): 05/20/2015  [facility] and [place] Number:  Herbalist and Address:  [facility], [place] [address] [place], [place] [address]      Provider Number: [id number]  Attending Physician Name and Address:  Jessy Oto, MD  Relative Name and Phone Number:       Current Level of Care: Hospital Recommended Level of Care: [place] Prior Approval Number:    Date Approved/Denied:   PASRR Number: [id number]  Discharge Plan: SNF    Current Diagnoses: Patient Active Problem List   Diagnosis Date Noted  . Urinary retention 05/21/2015    Class: Chronic  . Cervical spondylosis with radiculopathy 05/20/2015    Class: Acute  . Cervical disc disorder with radiculopathy of cervical region 05/20/2015  . Cervical radiculopathy at C5 05/20/2015  . Cough 04/29/2015  . Erectile dysfunction 01/04/2015  . Type 2 diabetes mellitus, uncontrolled ([place]) 11/16/2014  . Left leg swelling 06/22/2014  . Numbness-  Left foot 06/22/2014  . Essential hypertension 10/23/2013  . Left leg pain 09/22/2013  . Ganglion cyst of wrist 08/16/2013  . Shoulder pain 03/09/2013  . Neuroma 03/09/2013  . Glaucoma 11/18/2012  . Phantom limb pain ([place]) 11/18/2012  . Insomnia 11/18/2012  . Above knee amputation of right lower extremity ([place]) 10/11/2012  . Constipation 10/11/2012  . Dyslipidemia 08/10/2012  . Pain in limb-Bilateral leg 08/05/2012  . Atherosclerotic peripheral vascular disease with intermittent claudication ([place]) 05/18/2012  . Pain in joint, ankle and foot 05/11/2012  . Pain in both feet 05/11/2012  . History of tobacco abuse 05/01/2012  . Claudication ([place]) 04/29/2012    Orientation RESPIRATION BLADDER Height & Weight     Self, Time, Situation, Place  Normal Continent Weight: 239 lb  (108.41 kg) Height:  6' (182.9 cm)  BEHAVIORAL SYMPTOMS/MOOD NEUROLOGICAL BOWEL NUTRITION STATUS      Continent Diet (Please see discharge summary.)  AMBULATORY STATUS COMMUNICATION OF NEEDS Skin    (No ambulation due to pain.) Verbally Surgical wounds                       Personal Care Assistance Level of Assistance  Bathing, Feeding, Dressing Bathing Assistance: Limited assistance Feeding assistance: Limited assistance Dressing Assistance: Limited assistance     Functional Limitations Info             SPECIAL CARE FACTORS FREQUENCY  PT (By licensed PT), OT (By licensed OT)     PT Frequency: 5 OT Frequency: 5            Contractures      Additional Factors Info  Code Status, Allergies Code Status Info: FULL Allergies Info: [place]           Current Medications (05/22/2015):  This is the current hospital active medication list Current Facility-Administered Medications  Medication Dose Route Frequency Provider Last Rate Last Dose  . 0.9 %  sodium chloride infusion  250 mL Intravenous Continuous Jessy Oto, MD      . acetaminophen (TYLENOL) tablet 650 mg  650 mg Oral Q4H PRN Jessy Oto, MD       Or  . acetaminophen (TYLENOL) suppository 650 mg  650 mg Rectal Q4H PRN Jessy Oto, MD      . acetaminophen (TYLENOL) tablet 1,000 mg  1,000 mg Oral Daily PRN Jessy Oto, MD      . albuterol (PROVENTIL) (2.5 MG/3ML) 0.083% nebulizer solution 3 mL  3 mL Inhalation Q4H PRN Jessy Oto, MD      . atorvastatin (LIPITOR) tablet 40 mg  40 mg Oral q1800 Jessy Oto, MD   40 mg at 05/21/15 1714  . bisacodyl (DULCOLAX) EC tablet 5 mg  5 mg Oral Daily PRN Jessy Oto, MD      . celecoxib (CELEBREX) capsule 200 mg  200 mg Oral BID Jessy Oto, MD   200 mg at 05/22/15 1000  . clopidogrel (PLAVIX) tablet 75 mg  75 mg Oral Q breakfast Jessy Oto, MD   75 mg at 05/22/15 0809  . diltiazem (CARDIZEM CD) 24 hr capsule 240 mg  240 mg Oral Daily Jessy Oto, MD    240 mg at 05/22/15 0907  . docusate sodium (COLACE) capsule 100 mg  100 mg Oral BID Jessy Oto, MD   100 mg at 05/22/15 0909  . furosemide (LASIX) tablet 40 mg  40 mg Oral Daily Jessy Oto, MD   40 mg at 05/22/15 0910  . gabapentin (NEURONTIN) capsule 300 mg  300 mg Oral TID Jessy Oto, MD   300 mg at 05/22/15 0908  . HYDROcodone-acetaminophen (NORCO/VICODIN) 5-325 MG per tablet 1-2 tablet  1-2 tablet Oral Q4H PRN Jessy Oto, MD   2 tablet at 05/22/15 0807  . ibuprofen (ADVIL,MOTRIN) tablet 400 mg  400 mg Oral Q8H PRN Jessy Oto, MD      . insulin aspart (novoLOG) injection 0-15 Units  0-15 Units Subcutaneous TID WC Jessy Oto, MD   3 Units at 05/22/15 0804  . insulin aspart (novoLOG) injection 15 Units  15 Units Subcutaneous TID WC Jessy Oto, MD   15 Units at 05/22/15 [phone]  . latanoprost (XALATAN) 0.005 % ophthalmic solution 1 drop  1 drop Both Eyes QHS Jessy Oto, MD   1 drop at 05/21/15 2253  . lisinopril (PRINIVIL,ZESTRIL) tablet 40 mg  40 mg Oral Daily Jessy Oto, MD   40 mg at 05/22/15 0909  . loratadine (CLARITIN) tablet 10 mg  10 mg Oral Daily Jessy Oto, MD   10 mg at 05/22/15 0910  . menthol-cetylpyridinium (CEPACOL) lozenge 3 mg  1 lozenge Oral PRN Jessy Oto, MD       Or  . phenol (CHLORASEPTIC) mouth spray 1 spray  1 spray Mouth/Throat PRN Jessy Oto, MD      . methocarbamol (ROBAXIN) tablet 500 mg  500 mg Oral Q6H PRN Jessy Oto, MD   500 mg at 05/22/15 0452   Or  . methocarbamol (ROBAXIN) 500 mg in dextrose 5 % 50 mL IVPB  500 mg Intravenous Q6H PRN Jessy Oto, MD      . morphine 2 MG/ML injection 1-4 mg  1-4 mg Intravenous Q3H PRN Jessy Oto, MD   4 mg at 05/21/15 2040  . ondansetron (ZOFRAN) injection 4 mg  4 mg Intravenous Q4H PRN Jessy Oto, MD      . oxyCODONE-acetaminophen (PERCOCET/ROXICET) 5-325 MG per tablet 1-2 tablet  1-2 tablet Oral Q4H PRN Jessy Oto, MD   2 tablet at 05/22/15 [phone]  . pantoprazole (PROTONIX) EC  tablet 40 mg  40 mg Oral QHS Jessy Oto, MD   40 mg at 05/21/15 2251  . polyethylene glycol (MIRALAX /  GLYCOLAX) packet 17 g  17 g Oral Daily PRN Jessy Oto, MD   17 g at 05/21/15 [id number]  . potassium chloride SA (K-DUR,KLOR-CON) CR tablet 20 mEq  20 mEq Oral Daily Jessy Oto, MD   20 mEq at 05/22/15 0908  . sodium chloride flush (NS) 0.9 % injection 3 mL  3 mL Intravenous Q12H Jessy Oto, MD   3 mL at 05/22/15 1000  . sodium chloride flush (NS) 0.9 % injection 3 mL  3 mL Intravenous PRN Jessy Oto, MD      . sodium phosphate (FLEET) 7-19 GM/118ML enema 1 enema  1 enema Rectal Once PRN Jessy Oto, MD      . tamsulosin [facility]) capsule 0.4 mg  0.4 mg Oral QPC supper Jessy Oto, MD   0.4 mg at 05/21/15 1714     Discharge Medications: Please see discharge summary for a list of discharge medications.  Relevant Imaging Results:  Relevant Lab Results:   Additional Information SSN: [id number]  Caroline Sauger, LCSW

## 2015-05-22 NOTE — Progress Notes (Signed)
Physical Therapy Treatment Patient Details Name: Javier Quinn MRN: [id number] DOB: January 31, 1960 Today's Date: 05/22/2015    History of Present Illness 55 year old male s/p C4-5 foraminotomy on 05/20/15, pmh hypertension, diabetes mellitus type 2, PAD s/p right SFA-peroneal BPG 06/2012 and s/p right AKA 09/2012 (Dr. Bridgett Larsson), hyperlipidemia, GERD, hiatal hernia    PT Comments    Pt progressing slowly towards physical therapy goals. Pain is significantly limiting this patient's ability to progress functional mobility, and is requiring up to moderate assistance for transfers with equipment. Spoke with family member after session who states that pt does not have consistent assistance at home (she is available at times but not daily). Strongly recommend d/c to SNF to improve functional independence prior to return home. Pt agreeable to this plan. If pt does return home, will need 24 hour assistance, ambulance transfer, hospital bed, and lift equipment.   Follow Up Recommendations  SNF;Supervision/Assistance - 24 hour     Equipment Recommendations  None recommended by PT    Recommendations for Other Services       Precautions / Restrictions Precautions Precautions: Cervical;Fall Precaution Comments: Pt with previous AKA. Has prosthetic however pt states he does not always use it at home.  Required Braces or Orthoses: Cervical Brace Cervical Brace: Soft collar;At all times Restrictions Weight Bearing Restrictions: No    Mobility  Bed Mobility Overal bed mobility: Needs Assistance Bed Mobility: Sidelying to Sit   Sidelying to sit: Mod assist       General bed mobility comments: Pt already in sidelying when PT arrived. Pt elevated HOB all the way to assist in transitioning to sitting position, and still required mod assist for weight shift. Bed pad used to assist in scooting to EOB.   Transfers Overall transfer level: Needs assistance   Transfers: Sit to/from Stand Sit to Stand: Min  assist;Mod assist         General transfer comment: Min assist required from elevated bed height to stand. Pt able to pull up from center bar of [place] and extend hips to lower hip supports for pt to sit on. When standing from the supports, mod assist required for pt to achieve full stand.   Ambulation/Gait             General Gait Details: NT due to pain   Stairs            Wheelchair Mobility    Modified Rankin (Stroke Patients Only)       Balance Overall balance assessment: Needs assistance Sitting-balance support: Feet supported;No upper extremity supported Sitting balance-Leahy Scale: Fair     Standing balance support: Bilateral upper extremity supported;During functional activity Standing balance-Leahy Scale: Poor                      Cognition Arousal/Alertness: Awake/alert Behavior During Therapy: WFL for tasks assessed/performed Overall Cognitive Status: Within Functional Limits for tasks assessed       Memory: Decreased short-term memory              Exercises      General Comments        Pertinent Vitals/Pain Pain Assessment: Faces Faces Pain Scale: Hurts whole lot Pain Location: Neck and shoulders Pain Descriptors / Indicators: Operative site guarding;Aching;Burning Pain Intervention(s): Limited activity within patient's tolerance;Monitored during session;Repositioned    Home Living                      Prior Function  PT Goals (current goals can now be found in the care plan section) Acute Rehab PT Goals Patient Stated Goal: Decrease pain PT Goal Formulation: With patient Time For Goal Achievement: 05/27/15 Potential to Achieve Goals: Fair Progress towards PT goals: Progressing toward goals    Frequency  Min 5X/week    PT Plan Current plan remains appropriate    Co-evaluation             End of Session Equipment Utilized During Treatment: Gait belt Activity Tolerance: Patient  limited by pain Patient left: in chair;with call bell/phone within reach     Time: 1110-1131 PT Time Calculation (min) (ACUTE ONLY): 21 min  Charges:  $Therapeutic Activity: 8-22 mins                    G Codes:      Rolinda Roan 2015-06-08, 12:03 PM   Rolinda Roan, PT, DPT Acute Rehabilitation Services Pager: [phone]

## 2015-05-22 NOTE — Clinical Social Work Note (Signed)
Patient to be discharged to [facility]. Patient to be transported via [place]. RN report number: [place]: [phone] Surgical: [phone]

## 2015-05-22 NOTE — Progress Notes (Signed)
     Subjective: 2 Days Post-Op Procedure(s) (LRB): RIGHT C4-5 FORAMINOTOMY (N/A) Awake and out Patient reports pain as moderate.    Objective:   VITALS:  Temp:  [98.2 F (36.8 C)-99.8 F (37.7 C)] 98.2 F (36.8 C) (05/17 1313) Pulse Rate:  [96-104] 96 (05/17 1313) Resp:  [18-19] 19 (05/17 1313) BP: (115-177)/(71-84) 115/71 mmHg (05/17 1313) SpO2:  [94 %-97 %] 96 % (05/17 1313)  Neurologically intact ABD soft Neurovascular intact Sensation intact distally Intact pulses distally Dorsiflexion/Plantar flexion intact Incision: no drainage No cellulitis present   LABS  Recent Labs  05/21/15 0437  HGB 13.7  WBC 8.9  PLT 209    Recent Labs  05/21/15 0437  NA 138  K 4.0  CL 97*  CO2 29  BUN 10  CREATININE 1.06  GLUCOSE 173*   No results for input(s): LABPT, INR in the last 72 hours.   Assessment/Plan: 2 Days Post-Op Procedure(s) (LRB): RIGHT C4-5 FORAMINOTOMY (N/A)  Urinary retention Diabetes Mellitus Type II HTN  Advance diet Up with therapy D/C IV fluids Discharge to SNF  Foley to stay in place due to urinary retention. [facility] and Dr. Norris Cross contacted and  Will help with this patient. Okay for discharge to SNF post consultation.  Jkayla Spiewak E 05/22/2015, 4:20 PM

## 2015-05-22 NOTE — Consult Note (Signed)
I was called by Dr. Louanne Skye regarding the patient'spostoperative urinary retention.  He does have a Foley catheter in.  I would strongly recommend sending the patient home, on tamsulosin 0.4 mg daily.  In the discharge instructions, I have instructed the nursing home staff to call my office to set up an appropriate follow-up with any [facility] physician next week for voiding trial.

## 2015-05-22 NOTE — Discharge Summary (Addendum)
Physician Discharge Summary      Patient ID: Javier Quinn MRN: [id number] DOB/AGE: [birth year] 55 y.o.  Admit date: 05/20/2015 Discharge date: 5/17/20117  Admission Diagnoses:  Principal Problem:   Urinary retention Active Problems:   Cervical spondylosis with radiculopathy   Cervical disc disorder with radiculopathy of cervical region   Cervical radiculopathy at C5   Discharge Diagnoses:  Same  Past Medical History  Diagnosis Date  . Diabetes mellitus without complication ([place])   . Hyperlipidemia   . Tobacco dependence   . GERD (gastroesophageal reflux disease)   . H/O hiatal hernia   . Peripheral vascular disease ([place])   . Hypertension     See's Dr Johnsie Cancel States   . PVD (peripheral vascular disease) with claudication ([place]) 05/18/2012  . Atherosclerotic peripheral vascular disease with intermittent claudication ([place]) 05/18/2012  . Unspecified essential hypertension 05/18/2012  . Type II or unspecified type diabetes mellitus with peripheral circulatory disorders, uncontrolled(250.72) 10/11/2012  . Dyslipidemia 08/10/2012  . Above knee amputation of right lower extremity ([place])   . Asthma     Surgeries: Procedure(s): RIGHT C4-5 FORAMINOTOMY on 05/20/2015   Consultants:  Dr. Toniann Fail, [facility].  Discharged Condition: Improved  Hospital Course: Javier Quinn is an 55 y.o. male who was admitted 05/20/2015 with a chief complaint of No chief complaint on file. , and found to have a diagnosis of Urinary retention.  He was brought to the operating room on 05/20/2015 and underwent the above named procedures.    He was given perioperative antibiotics:      Anti-infectives    Start     Dose/Rate Route Frequency Ordered Stop   05/20/15 1530  ceFAZolin (ANCEF) IVPB 1 g/50 mL premix     1 g 100 mL/hr over 30 Minutes Intravenous Every 8 hours 05/20/15 1403 05/21/15 0000   05/20/15 0700  ceFAZolin (ANCEF) IVPB 2g/100 mL premix     2 g 200 mL/hr over 30 Minutes  Intravenous To ShortStay Surgical 05/19/15 1459 05/20/15 0755    Slow to reactivate following anesthesia, recovered in the PACU and was transferred to Med Surg Floor for continued Recovery from posterior cervical spine surgery with soft  C-collar. POD#1. Foley was discontinued and he was unable to void requiring replacement of foley catheter. Complained of severe neck pain. Right shoulder pain and  Weakness right C5. PT and OT saw patient and he was Found to have significant dependencies in his transfers and Even transferring to a sitting position. Incision was dry. He tolerated po nourishment. A skilled nursing home evaluation And social service consult placed. PT and OT noted slow  At PT due to dependencies, right UE pain and weakness and Right AKA. Poor help at home with no ability for insurance to Provide for [facility] and HHN PT. POD#2 dressing changed, incision is dry. CBGs 180. Tolerating pos. Skill nursing facility Placement is underway. Started on flomax for urinary retention. Urology consult requested and Dr. Norris Cross consulted, recommended for medication and a voiding trial in one week, his consult note is in the chart. FL2 form signed and patient accepted to [facility] for post  Operative care and rehabilitation followiing right C4-5 foraminotomy with urinary retention and Right shoulder bursitis and tendonitis with AC arthrosis.  He was given sequential compression devices, early ambulation, and chemoprophylaxis for DVT prophylaxis.  He benefited maximally from their hospital stay and there were no complications.    Recent vital signs:  Filed Vitals:   05/22/15 0809 05/22/15 1313  BP: 177/84 115/71  Pulse:  96  Temp:  98.2 F (36.8 C)  Resp:  19    Recent laboratory studies:  Results for orders placed or performed during the hospital encounter of 05/20/15  Glucose, capillary  Result Value Ref Range   Glucose-Capillary 152 (H) 65 - 99 mg/dL   Comment 1 Notify RN    Comment  2 Call MD NNP PA CNM    Comment 3 Document in Chart   Glucose, capillary  Result Value Ref Range   Glucose-Capillary 154 (H) 65 - 99 mg/dL   Comment 1 Notify RN   Glucose, capillary  Result Value Ref Range   Glucose-Capillary 134 (H) 65 - 99 mg/dL  CBC  Result Value Ref Range   WBC 8.9 4.0 - 10.5 K/uL   RBC 4.91 4.22 - 5.81 MIL/uL   Hemoglobin 13.7 13.0 - 17.0 g/dL   HCT 41.9 39.0 - 52.0 %   MCV 85.3 78.0 - 100.0 fL   MCH 27.9 26.0 - 34.0 pg   MCHC 32.7 30.0 - 36.0 g/dL   RDW 13.1 11.5 - 15.5 %   Platelets 209 150 - 400 K/uL  Basic Metabolic Panel  Result Value Ref Range   Sodium 138 135 - 145 mmol/L   Potassium 4.0 3.5 - 5.1 mmol/L   Chloride 97 (L) 101 - 111 mmol/L   CO2 29 22 - 32 mmol/L   Glucose, Bld 173 (H) 65 - 99 mg/dL   BUN 10 6 - 20 mg/dL   Creatinine, Ser 1.06 0.61 - 1.24 mg/dL   Calcium 9.3 8.9 - 10.3 mg/dL   GFR calc non Af Amer >60 >60 mL/min   GFR calc Af Amer >60 >60 mL/min   Anion gap 12 5 - 15  Glucose, capillary  Result Value Ref Range   Glucose-Capillary 139 (H) 65 - 99 mg/dL  Glucose, capillary  Result Value Ref Range   Glucose-Capillary 147 (H) 65 - 99 mg/dL  Glucose, capillary  Result Value Ref Range   Glucose-Capillary 191 (H) 65 - 99 mg/dL  Glucose, capillary  Result Value Ref Range   Glucose-Capillary 176 (H) 65 - 99 mg/dL   Comment 1 Notify RN    Comment 2 Document in Chart   Glucose, capillary  Result Value Ref Range   Glucose-Capillary 185 (H) 65 - 99 mg/dL  Glucose, capillary  Result Value Ref Range   Glucose-Capillary 210 (H) 65 - 99 mg/dL  Glucose, capillary  Result Value Ref Range   Glucose-Capillary 184 (H) 65 - 99 mg/dL  Glucose, capillary  Result Value Ref Range   Glucose-Capillary 202 (H) 65 - 99 mg/dL  Glucose, capillary  Result Value Ref Range   Glucose-Capillary 207 (H) 65 - 99 mg/dL   Comment 1 Repeat Test    Comment 2 Document in Chart     Discharge Medications:     Medication List    TAKE these  medications        ACCU-CHEK AVIVA PLUS w/Device Kit  1 Device by Does not apply route 3 (three) times daily after meals. E11.65     acetaminophen 500 MG tablet  Commonly known as:  TYLENOL  Take 1,000 mg by mouth daily as needed for mild pain.     albuterol 108 (90 Base) MCG/ACT inhaler  Commonly known as:  PROVENTIL HFA;VENTOLIN HFA  Inhale 1-2 puffs into the lungs every 4 (four) hours as needed for wheezing or shortness of breath.     atorvastatin  40 MG tablet  Commonly known as:  LIPITOR  Take 40 mg by mouth daily.     cetirizine 10 MG tablet  Commonly known as:  ZYRTEC  Take 1 tablet (10 mg total) by mouth daily.     clopidogrel 75 MG tablet  Commonly known as:  PLAVIX  Take 1 tablet (75 mg total) by mouth daily with breakfast.     diltiazem 240 MG 24 hr capsule  Commonly known as:  CARDIZEM CD  Take 1 capsule (240 mg total) by mouth daily.     docusate sodium 100 MG capsule  Commonly known as:  COLACE  Take 100 mg by mouth daily.     exenatide 5 MCG/0.02ML Sopn injection  Commonly known as:  BYETTA 5 MCG PEN  Inject 0.02 mLs (5 mcg total) into the skin 2 (two) times daily with a meal.     furosemide 40 MG tablet  Commonly known as:  LASIX  Take 1 tablet (40 mg total) by mouth daily.     gabapentin 300 MG capsule  Commonly known as:  NEURONTIN  Take 1 capsule (300 mg total) by mouth 3 (three) times daily.     glimepiride 4 MG tablet  Commonly known as:  AMARYL  Take 1 tablet (4 mg total) by mouth daily with supper.     glucose blood test strip  Commonly known as:  ACCU-CHEK AVIVA PLUS  1 each by Other route 3 (three) times daily. E11.65     ibuprofen 400 MG tablet  Commonly known as:  ADVIL,MOTRIN  Take 1 tablet (400 mg total) by mouth every 8 (eight) hours as needed for moderate pain (with food for 5 days).     insulin lispro 100 UNIT/ML injection  Commonly known as:  HUMALOG  Inject 0.15 mLs (15 Units total) into the skin 3 (three) times daily with  meals.     Insulin Pen Needle 31G X 8 MM Misc  Commonly known as:  B-D ULTRAFINE III SHORT PEN  1 application by Does not apply route 2 (two) times daily.     latanoprost 0.005 % ophthalmic solution  Commonly known as:  XALATAN  Place 1 drop into both eyes daily.     lisinopril 40 MG tablet  Commonly known as:  PRINIVIL,ZESTRIL  Take 1 tablet (40 mg total) by mouth daily.     metFORMIN 500 MG 24 hr tablet  Commonly known as:  GLUCOPHAGE XR  Take 4 tablets (2,000 mg total) by mouth daily with breakfast.     potassium chloride SA 20 MEQ tablet  Commonly known as:  K-DUR,KLOR-CON  Take 1 tablet (20 mEq total) by mouth daily.     sildenafil 100 MG tablet  Commonly known as:  VIAGRA  Take 0.5-1 tablets (50-100 mg total) by mouth daily as needed for erectile dysfunction.     TRUEPLUS LANCETS 28G Misc  1 each by Does not apply route 3 (three) times daily.        Diagnostic Studies: Dg Chest 2 View  05/01/2015  CLINICAL DATA:  Increased cough at night for 2 weeks. EXAM: CHEST  2 VIEW COMPARISON:  10/04/2014 chest radiograph. FINDINGS: Stable cardiomediastinal silhouette with normal heart size. No pneumothorax. No pleural effusion. No pulmonary edema. Mild bibasilar atelectasis. Otherwise clear lungs. IMPRESSION: Mild bibasilar atelectasis. Electronically Signed   By: Ilona Sorrel M.D.   On: 05/01/2015 08:31   Dg Cervical Spine 1 View  05/20/2015  CLINICAL DATA:  C-spine surgery. EXAM:  CERVICAL SPINE 1 VIEW COMPARISON:  MRI 03/26/2014. FINDINGS: Metallic marker noted posteriorly at approximately the C4 spinous process level. No acute bony abnormality demonstrated. IMPRESSION: Metallic marker noted posteriorly at approximately the C4 spinous process level. Electronically Signed   By: Marcello Moores  Register   On: 05/20/2015 08:59   Dg Cervical Spine 2 Or 3 Views  05/20/2015  CLINICAL DATA:  Localization films for C4-C5 foraminotomy. EXAM: CERVICAL SPINE - 2-3 VIEW COMPARISON:  None. FINDINGS:  Two spot intraoperative fluoroscopic images of the cervical spine are provided for review. Images demonstrate radiopaque marking surgical instrument overlying the soft tissues about the C4-C5 articulation. Additional radiopaque surgical support apparatus is noted about the operative site. Endotracheal tube overlies the tracheal air column with tip excluded from view. Enteric tube tip is also excluded from view. IMPRESSION: Intraoperative localization of C4-C5 as above. Electronically Signed   By: Sandi Mariscal M.D.   On: 05/20/2015 08:56    Disposition: 01-Home or Self Care  Discharge Instructions    Call MD / Call 911    Complete by:  As directed   If you experience chest pain or shortness of breath, CALL 911 and be transported to the hospital emergency room.  If you develope a fever above 101 F, pus (white drainage) or increased drainage or redness at the wound, or calf pain, call your surgeon's office.     Constipation Prevention    Complete by:  As directed   Drink plenty of fluids.  Prune juice may be helpful.  You may use a stool softener, such as Colace (over the counter) 100 mg twice a day.  Use MiraLax (over the counter) for constipation as needed.     Diet Carb Modified    Complete by:  As directed      Discharge instructions    Complete by:  As directed   No lifting greater than 10 lbs. Avoid bending, stooping and twisting. Using wheel chair in house for first week then may start to get out slowly increasing activity using arms. Keep incision dry for 3 days, may use tegaderm or similar water impervious dressing. Avoid overhead use of arms and overhead lifting. Wear collar for comfort. Use ice as needed for comfort.     Increase activity slowly as tolerated    Complete by:  As directed      Lifting restrictions    Complete by:  As directed   No lifting for 8 weeks           Follow-up Information    Follow up with Lasasha Brophy E, MD In 2 weeks.   Specialty:  Orthopedic Surgery    Contact information:   [place] [address] [phone]       Follow up with [facility]   Why:  call for appointment for the week of 5/22-26 for follow-up of postoperative urinary retention   Contact information:   [place] [address] [phone]        Signed: Jessy Oto 05/22/2015, 4:31 PM

## 2015-05-23 ENCOUNTER — Non-Acute Institutional Stay (SKILLED_NURSING_FACILITY): Payer: Medicaid Other | Admitting: Internal Medicine

## 2015-05-23 ENCOUNTER — Encounter: Payer: Self-pay | Admitting: Internal Medicine

## 2015-05-23 DIAGNOSIS — S78111A Complete traumatic amputation at level between right hip and knee, initial encounter: Secondary | ICD-10-CM

## 2015-05-23 DIAGNOSIS — E785 Hyperlipidemia, unspecified: Secondary | ICD-10-CM

## 2015-05-23 DIAGNOSIS — I10 Essential (primary) hypertension: Secondary | ICD-10-CM

## 2015-05-23 DIAGNOSIS — M25511 Pain in right shoulder: Secondary | ICD-10-CM | POA: Diagnosis not present

## 2015-05-23 DIAGNOSIS — H409 Unspecified glaucoma: Secondary | ICD-10-CM

## 2015-05-23 DIAGNOSIS — M5412 Radiculopathy, cervical region: Secondary | ICD-10-CM

## 2015-05-23 DIAGNOSIS — Z89611 Acquired absence of right leg above knee: Secondary | ICD-10-CM | POA: Diagnosis not present

## 2015-05-23 DIAGNOSIS — E1151 Type 2 diabetes mellitus with diabetic peripheral angiopathy without gangrene: Secondary | ICD-10-CM

## 2015-05-23 NOTE — Progress Notes (Signed)
Patient ID: Javier Quinn, male   DOB: September 20, 1960, 55 y.o.   MRN: [id number]  Provider:   Location:  [place] Room Number: [id number] Place of Service:  SNF (31)  PCP: Estill Dooms, MD Patient Care Team: Estill Dooms, MD as PCP - General (Internal Medicine) Jessy Oto, MD as Consulting Physician (Orthopedic Surgery) Franchot Gallo, MD as Consulting Physician (Urology)  Extended Emergency Contact Information Primary Emergency Contact: Javier Quinn, [name] Phone: [phone] Relation: Friend  Code Status: fiull Goals of Care: Advanced Directive information Advanced Directives 05/23/2015  Does patient have an advance directive? No  Would patient like information on creating an advanced directive? -      Chief Complaint  Patient presents with  . New Admit To SNF    following hospitalization 05/20/15 to 05/22/15 neck pain and right UE radiculopathy. 05/20/15 right C4-5 foraminotomy Dr. Louanne Skye    HPI: Patient is a 55 y.o. male [facility] following a hospitalization from 05/20/2015 through 05/22/2015.  Hospitalization was for surgery to his neck. Prior to surgery he was having severe right shoulder discomfort. Patient has cervical spondylosis with right radiculopathy at the C5 level. He underwent right L4-5 foraminotomy on 05/20/2015. Following surgery he was "slow to recover".  Following surgery patient had difficulty emptying his bladder and suffered from urinary retention. Patient is now voiding without a catheter.  Other diagnoses include diabetes mellitus with peripheral vascular disease. He takes Byetta and glimepiride for this. He also is using Humalog and metformin. Patient had a right above-the-knee amputation by Dr. Adele Barthel, in 2014 when he lost circulation in the right leg  He has hyperlipidemia for which he takes  atorvastatin, GERD, hypertension, generalized weakness, glaucoma, and erectile dysfunction.  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 Previous work included detailing cars. He lives alone. He needs to regain strength in order to be able to have the ability to care for himself at home. He is having trouble turning over in bed or doing much for himself in regards to getting in and out of the bathroom, bathing, clothing himself, etc.  Past Medical History  Diagnosis Date  . Diabetes mellitus without complication ([place])   . Hyperlipidemia   . Tobacco dependence   . GERD (gastroesophageal reflux disease)   . H/O hiatal hernia   . Peripheral vascular disease ([place])   . Hypertension     See's Dr Johnsie Cancel States   . PVD (peripheral vascular disease) with claudication ([place]) 05/18/2012  . Atherosclerotic peripheral vascular disease with intermittent claudication ([place]) 05/18/2012  . Unspecified essential hypertension 05/18/2012  . Type II or unspecified type diabetes mellitus with peripheral circulatory disorders, uncontrolled(250.72) 10/11/2012  . Dyslipidemia 08/10/2012  . Above knee amputation of right lower extremity ([place])   . Asthma    Past Surgical History  Procedure Laterality Date  . Bypass graft femoral-peroneal Right 06/15/2012    Procedure: BYPASS GRAFT Campbell Stall;  Surgeon: Conrad Jeffersonville, MD;  Location: [facility];  Service: Vascular;  Laterality: Right;  using right non-reversed saphenous vein  . Lower extremity angiogram Right 06/15/2012    Procedure: LOWER EXTREMITY ANGIOGRAM;  Surgeon: Conrad Downs, MD;  Location: [facility];  Service: Vascular;  Laterality: Right;  . Lower extremity angiogram Right 09/09/2012    Procedure: LOWER EXTREMITY ANGIOGRAM With Agioplasty Bypass Graft;  Surgeon: Conrad Lane, MD;  Location: [facility];  Service: Vascular;  Laterality: Right;  . Amputation Right 09/28/2012    Procedure: AMPUTATION ABOVE KNEE;  Surgeon: Conrad Preston, MD;  Location: [facility];  Service: Vascular;  Laterality: Right;    . Ganglion cyst excision Right 08/16/2013    Procedure: REMOVAL GANGLION cyst  RIGHT WRIST;  Surgeon: Marianna Payment, MD;  Location: [facility];  Service: Orthopedics;  Laterality: Right;  . Abdominal aortagram N/A 05/12/2012    Procedure: ABDOMINAL AORTAGRAM;  Surgeon: Conrad Mesa, MD;  Location: [facility] CATH LAB;  Service: Cardiovascular;  Laterality: N/A;  . Lower extremity angiogram  05/12/2012    Procedure: LOWER EXTREMITY ANGIOGRAM;  Surgeon: Conrad Caney, MD;  Location: [facility] CATH LAB;  Service: Cardiovascular;;  . Colonoscopy    . Posterior cervical fusion/foraminotomy N/A 05/20/2015    Procedure: RIGHT C4-5 FORAMINOTOMY;  Surgeon: Jessy Oto, MD;  Location: [facility];  Service: Orthopedics;  Laterality: N/A;    reports that he quit smoking about 3 years ago. His smoking use included Cigarettes. He has a 10 pack-year smoking history. He has quit using smokeless tobacco. He reports that he does not drink alcohol or use illicit drugs. Social History   Social History  . Marital Status: Single    Spouse Name: N/A  . Number of Children: N/A  . Years of Education: N/A   Occupational History  . Not on file.   Social History Main Topics  . Smoking status: Former Smoker -- 0.50 packs/day for 20 years    Types: Cigarettes    Quit date: 04/11/2012  . Smokeless tobacco: Former Systems developer  . Alcohol Use: No     Comment: occ  . Drug Use: No  . Sexual Activity: Not on file   Other Topics Concern  . Not on file   Social History Narrative    Functional Status Survey:    Family History  Problem Relation Age of Onset  . Cancer Mother     pancreas  . Cancer Father   . Heart attack Sister     Health Maintenance  Topic Date Due  . Samul Dada  06/05/1977  . INFLUENZA VACCINE  08/06/2015  . HEMOGLOBIN A1C  10/29/2015  . FOOT EXAM  11/16/2015  . OPHTHALMOLOGY EXAM  01/30/2016  . PNEUMOCOCCAL POLYSACCHARIDE VACCINE (2) 09/29/2017  . COLONOSCOPY  12/21/2022  . Hepatitis C Screening  Completed   . HIV Screening  Completed    Allergies  Allergen Reactions  . Codeine Nausea And Vomiting      Medication List       This list is accurate as of: 05/23/15 12:47 PM.  Always use your most recent med list.               acetaminophen 500 MG tablet  Commonly known as:  TYLENOL  Take 500 mg by mouth. Take 2 tablets as needed every 8 hours     albuterol 108 (90 Base) MCG/ACT inhaler  Commonly known as:  PROVENTIL HFA;VENTOLIN HFA  Inhale 1-2 puffs into the lungs every 4 (four) hours as needed for wheezing or shortness of breath.     atorvastatin 40 MG tablet  Commonly known as:  LIPITOR  Take 40 mg by mouth daily.     celecoxib 200 MG capsule  Commonly known as:  CELEBREX  Take 1 capsule (200 mg total) by mouth 2 (two) times daily.     cetirizine 10 MG tablet  Commonly known as:  ZYRTEC  Take  1 tablet (10 mg total) by mouth daily.     clopidogrel 75 MG tablet  Commonly known as:  PLAVIX  Take 1 tablet (75 mg total) by mouth daily with breakfast.     diltiazem 240 MG 24 hr capsule  Commonly known as:  CARDIZEM CD  Take 1 capsule (240 mg total) by mouth daily.     docusate sodium 100 MG capsule  Commonly known as:  COLACE  Take 100 mg by mouth daily.     exenatide 5 MCG/0.02ML Sopn injection  Commonly known as:  BYETTA 5 MCG PEN  Inject 0.02 mLs (5 mcg total) into the skin 2 (two) times daily with a meal.     furosemide 40 MG tablet  Commonly known as:  LASIX  Take 1 tablet (40 mg total) by mouth daily.     gabapentin 300 MG capsule  Commonly known as:  NEURONTIN  Take 1 capsule (300 mg total) by mouth 3 (three) times daily.     glimepiride 4 MG tablet  Commonly known as:  AMARYL  Take 1 tablet (4 mg total) by mouth daily with supper.     ibuprofen 400 MG tablet  Commonly known as:  ADVIL,MOTRIN  Take 400 mg by mouth. One tablet every 8 hours as needed for moderate pain     insulin lispro 100 UNIT/ML injection  Commonly known as:  HUMALOG  Inject  0.15 mLs (15 Units total) into the skin 3 (three) times daily with meals.     latanoprost 0.005 % ophthalmic solution  Commonly known as:  XALATAN  Place 1 drop into both eyes daily.     lisinopril 40 MG tablet  Commonly known as:  PRINIVIL,ZESTRIL  Take 1 tablet (40 mg total) by mouth daily.     metFORMIN 500 MG 24 hr tablet  Commonly known as:  GLUCOPHAGE XR  Take 4 tablets (2,000 mg total) by mouth daily with breakfast.     methocarbamol 500 MG tablet  Commonly known as:  ROBAXIN  Take 1 tablet (500 mg total) by mouth every 8 (eight) hours as needed for muscle spasms.     oxyCODONE-acetaminophen 5-325 MG tablet  Commonly known as:  PERCOCET/ROXICET  Take 1-2 tablets by mouth every 4 (four) hours as needed for moderate pain.     potassium chloride SA 20 MEQ tablet  Commonly known as:  K-DUR,KLOR-CON  Take 1 tablet (20 mEq total) by mouth daily.     sildenafil 100 MG tablet  Commonly known as:  VIAGRA  Take 0.5-1 tablets (50-100 mg total) by mouth daily as needed for erectile dysfunction.     TRUEPLUS LANCETS 28G Misc  1 each by Does not apply route 3 (three) times daily.        Review of Systems  Constitutional: Negative for activity change, appetite change, fatigue and unexpected weight change.       Overweight  HENT: Negative for congestion and hearing loss.        Missing teeth. Small posterior scar at the neck and neck tenderness.  Eyes: Negative.   Respiratory: Negative for cough and shortness of breath.   Cardiovascular: Positive for leg swelling (elevation helps swelling). Negative for chest pain and palpitations.  Gastrointestinal: Positive for constipation (improved on medications). Negative for abdominal pain and diarrhea.  Genitourinary: Negative for dysuria and difficulty urinating.  Musculoskeletal: Positive for myalgias and arthralgias.       Right shoulder pain. Right AKA.  Skin: Negative for color change  and wound.  Neurological: Negative for  dizziness and weakness.       Phantom pain in right AKA and leg pain in left . Pain in the right shoulder felt secondary to problems of his right radiculopathy at C5.  Hematological: Negative.   Psychiatric/Behavioral: Negative for behavioral problems and confusion.    Filed Vitals:   05/23/15 1231  BP: 132/74  Pulse: 78  Temp: 98 F (36.7 C)  Resp: 18  Height: 6' (1.829 m)  Weight: 239 lb (108.41 kg)  SpO2: 97%   Body mass index is 32.41 kg/(m^2). Physical Exam  Constitutional: He is oriented to person, place, and time. He appears well-developed and well-nourished.  Obese  HENT:  Head: Normocephalic and atraumatic.  Right Ear: External ear normal.  Left Ear: External ear normal.  Nose: Nose normal.  Mouth/Throat: Oropharynx is clear and moist. No oropharyngeal exudate.  Eyes: Conjunctivae and EOM are normal. Pupils are equal, round, and reactive to light.  Neck: Normal range of motion. Neck supple. No JVD present. No tracheal deviation present. No thyromegaly present.  Small posterior scar and tenderness with palpation.  Cardiovascular: Normal rate, regular rhythm and normal heart sounds.  Exam reveals no gallop and no friction rub.   No murmur heard. Diminished DP and PT pulsations in the left foot  Pulmonary/Chest: Effort normal and breath sounds normal. No respiratory distress. He has no wheezes. He has no rales. He exhibits no tenderness.  Abdominal: Soft. Bowel sounds are normal. He exhibits no distension and no mass. There is no tenderness.  Musculoskeletal: Normal range of motion. He exhibits no tenderness. Edema: +1 on left LE.  Right AKA Small ganglion cyst to right wrist  Lymphadenopathy:    He has no cervical adenopathy.  Neurological: He is alert and oriented to person, place, and time. He has normal reflexes. No cranial nerve deficit. Coordination normal.  Pain in right shoulder secondary to C5 radiculopathy  Skin: Skin is warm and dry. No rash noted. He is  not diaphoretic. No erythema. No pallor.  Psychiatric: He has a normal mood and affect. His behavior is normal. Judgment and thought content normal.    Labs reviewed: Basic Metabolic Panel:  Recent Labs  03/18/15 1106 05/16/15 1333 05/21/15 0437  NA 138 141 138  K 4.2 4.1 4.0  CL 104 108 97*  CO2 25 22 29   GLUCOSE 96 89 173*  BUN 9 10 10   CREATININE 0.80 1.05 1.06  CALCIUM 9.5 9.8 9.3   Liver Function Tests:  Recent Labs  01/09/15 0937 05/16/15 1333  AST 24 26  ALT 37 39  ALKPHOS 87 92  BILITOT 0.3 0.7  PROT 6.7 7.0  ALBUMIN 3.9 4.1   No results for input(s): LIPASE, AMYLASE in the last 8760 hours. No results for input(s): AMMONIA in the last 8760 hours. CBC:  Recent Labs  10/04/14 1036 01/09/15 0937 05/16/15 1333 05/21/15 0437  WBC 4.7 7.1 9.0 8.9  NEUTROABS 2.8  --   --   --   HGB 13.4 14.8 15.4 13.7  HCT 38.9* 43.1 45.3 41.9  MCV 85.3 81.9 85.0 85.3  PLT 242 221 270 209   Cardiac Enzymes: No results for input(s): CKTOTAL, CKMB, CKMBINDEX, TROPONINI in the last 8760 hours. BNP: Invalid input(s): POCBNP Lab Results  Component Value Date   HGBA1C 7.1 04/29/2015   Lab Results  Component Value Date   TSH 0.856 04/29/2012   Assessment and plan 1. Cervical radiculopathy at C5 Enrolled in physical  therapy and occupational therapy to improve movement and hopefully pain as well as strengthening. Patient currently in the SNF for pain control, strengthening, and improvement in self-care skills.  2. Right shoulder pain Currently believe to be secondary to residual problems of the C5 radiculopathy  3. Controlled type 2 DM with peripheral circulatory disorder (HCC) Continue current medication  4. Dyslipidemia Continue current medication  5. Essential hypertension Continue current medication  6. Glaucoma Continue current drops  7. Above knee amputation of right lower extremity (HCC) Stable

## 2015-05-27 ENCOUNTER — Non-Acute Institutional Stay (SKILLED_NURSING_FACILITY): Payer: Medicaid Other | Admitting: Internal Medicine

## 2015-05-27 DIAGNOSIS — E1165 Type 2 diabetes mellitus with hyperglycemia: Secondary | ICD-10-CM

## 2015-05-27 DIAGNOSIS — R609 Edema, unspecified: Secondary | ICD-10-CM | POA: Insufficient documentation

## 2015-05-27 DIAGNOSIS — E1159 Type 2 diabetes mellitus with other circulatory complications: Secondary | ICD-10-CM

## 2015-05-27 DIAGNOSIS — M7989 Other specified soft tissue disorders: Secondary | ICD-10-CM

## 2015-05-27 DIAGNOSIS — I10 Essential (primary) hypertension: Secondary | ICD-10-CM

## 2015-05-27 DIAGNOSIS — Z794 Long term (current) use of insulin: Secondary | ICD-10-CM

## 2015-05-27 DIAGNOSIS — IMO0002 Reserved for concepts with insufficient information to code with codable children: Secondary | ICD-10-CM

## 2015-05-27 DIAGNOSIS — E1151 Type 2 diabetes mellitus with diabetic peripheral angiopathy without gangrene: Secondary | ICD-10-CM | POA: Diagnosis not present

## 2015-05-27 MED ORDER — METFORMIN HCL ER 500 MG PO TB24: ORAL_TABLET | ORAL | Status: AC

## 2015-05-27 MED ORDER — FUROSEMIDE 40 MG PO TABS: 40.0000 mg | ORAL_TABLET | Freq: Every day | ORAL | Status: AC | PRN

## 2015-05-27 NOTE — Progress Notes (Signed)
Patient ID: Javier Quinn, male   DOB: 1960/09/30, 55 y.o.   MRN: [id number]    [place] Room Number: 628  Place of Service: SNF (31)     Allergies  Allergen Reactions  . Codeine Nausea And Vomiting    Chief Complaint  Patient presents with  . Acute Visit    medication adjustments    HPI:  Patient seen acutely for his complaints of medication adjustment. He is refusing take more than 500 mg metformin and time. He was discharged from the hospital on 2000 mg daily. He says that he takes 500 mg with meals at home.  Patient is refusing Lasix and was only on an as-needed basis.  Patient states he does not take Humalog 15 units with meals if his CBG is less than 100%.  Blood pressure is modestly holiday and he wonders if she should be on a different medication that.  Diabetic control is really on a very complicated regimen including Byetta twice daily, Humalog up to 3 times daily, metformin 500 mg 3 times a day at home, Amaryl 4 mg daily with supper. He says that he is not aware that he was ever put her on long-acting insulin.  Medications: Patient's Medications  New Prescriptions   No medications on file  Previous Medications   ACETAMINOPHEN (TYLENOL) 500 MG TABLET    Take 500 mg by mouth. Take 2 tablets as needed every 8 hours   ALBUTEROL (PROVENTIL HFA;VENTOLIN HFA) 108 (90 BASE) MCG/ACT INHALER    Inhale 1-2 puffs into the lungs every 4 (four) hours as needed for wheezing or shortness of breath.   ATORVASTATIN (LIPITOR) 40 MG TABLET    Take 40 mg by mouth daily.   CELECOXIB (CELEBREX) 200 MG CAPSULE    Take 1 capsule (200 mg total) by mouth 2 (two) times daily.   CETIRIZINE (ZYRTEC) 10 MG TABLET    Take 1 tablet (10 mg total) by mouth daily.   CLOPIDOGREL (PLAVIX) 75 MG TABLET    Take 1 tablet (75 mg total) by mouth daily with breakfast.   DILTIAZEM (CARDIZEM CD) 240 MG 24 HR CAPSULE    Take 1 capsule (240 mg total) by mouth daily.   DOCUSATE SODIUM (COLACE) 100 MG CAPSULE    Take 100 mg by mouth daily.    EXENATIDE (BYETTA 5 MCG PEN) 5 MCG/0.02ML SOPN INJECTION    Inject 0.02 mLs (5 mcg total) into the skin 2 (two) times daily with a meal.   FUROSEMIDE (LASIX) 40 MG TABLET    Take 1 tablet (40 mg total) by mouth daily.   GABAPENTIN (NEURONTIN) 300 MG CAPSULE    Take 1 capsule (300 mg total) by mouth 3 (three) times daily.   GLIMEPIRIDE (AMARYL) 4 MG TABLET    Take 1 tablet (4 mg total) by mouth daily with supper.   IBUPROFEN (ADVIL,MOTRIN) 400 MG TABLET    Take 400 mg by mouth. One tablet every 8 hours as needed for moderate pain   INSULIN LISPRO (HUMALOG) 100 UNIT/ML INJECTION    Inject 0.15 mLs (15 Units total) into the skin 3 (three) times daily with meals.   LATANOPROST (XALATAN) 0.005 % OPHTHALMIC SOLUTION    Place 1 drop into both eyes daily.   LISINOPRIL (PRINIVIL,ZESTRIL) 40 MG TABLET    Take 1 tablet (40 mg total) by mouth daily.   METFORMIN (GLUCOPHAGE XR) 500 MG 24 HR TABLET    Take 4 tablets (2,000 mg  total) by mouth daily with breakfast.   METHOCARBAMOL (ROBAXIN) 500 MG TABLET    Take 1 tablet (500 mg total) by mouth every 8 (eight) hours as needed for muscle spasms.   OXYCODONE-ACETAMINOPHEN (PERCOCET/ROXICET) 5-325 MG TABLET    Take 1-2 tablets by mouth every 4 (four) hours as needed for moderate pain.   POTASSIUM CHLORIDE SA (K-DUR,KLOR-CON) 20 MEQ TABLET    Take 1 tablet (20 mEq total) by mouth daily.   SILDENAFIL (VIAGRA) 100 MG TABLET    Take 0.5-1 tablets (50-100 mg total) by mouth daily as needed for erectile dysfunction.   TRUEPLUS LANCETS 28G MISC    1 each by Does not apply route 3 (three) times daily.  Modified Medications   No medications on file  Discontinued Medications   No medications on file     Review of Systems  Constitutional: Negative for activity change, appetite change, fatigue and unexpected weight change.       Overweight  HENT: Negative for congestion and hearing loss.         Missing teeth. Small posterior scar at the neck and neck tenderness.  Eyes: Negative.   Respiratory: Negative for cough and shortness of breath.   Cardiovascular: Positive for leg swelling (elevation helps swelling). Negative for chest pain and palpitations.  Gastrointestinal: Positive for constipation (improved on medications). Negative for abdominal pain and diarrhea.  Genitourinary: Negative for dysuria and difficulty urinating.  Musculoskeletal: Positive for myalgias and arthralgias.       Right shoulder pain. Right AKA.  Skin: Negative for color change and wound.  Neurological: Negative for dizziness and weakness.       Phantom pain in right AKA and leg pain in left . Pain in the right shoulder felt secondary to problems of his right radiculopathy at C5.  Hematological: Negative.   Psychiatric/Behavioral: Negative for behavioral problems and confusion.    Filed Vitals:   05/27/15 1443  BP: 136/98  Pulse: 70  Temp: 97.4 F (36.3 C)  Resp: 16   Wt Readings from Last 3 Encounters:  05/23/15 239 lb (108.41 kg)  05/20/15 239 lb (108.41 kg)  05/16/15 239 lb (108.41 kg)    There is no weight on file to calculate BMI.  Physical Exam  Constitutional: He is oriented to person, place, and time. He appears well-developed and well-nourished.  Obese  HENT:  Head: Normocephalic and atraumatic.  Right Ear: External ear normal.  Left Ear: External ear normal.  Nose: Nose normal.  Mouth/Throat: Oropharynx is clear and moist. No oropharyngeal exudate.  Eyes: Conjunctivae and EOM are normal. Pupils are equal, round, and reactive to light.  Neck: Normal range of motion. Neck supple. No JVD present. No tracheal deviation present. No thyromegaly present.  Small posterior scar and tenderness with palpation.  Cardiovascular: Normal rate, regular rhythm and normal heart sounds.  Exam reveals no gallop and no friction rub.   No murmur heard. Diminished DP and PT pulsations in the left foot    Pulmonary/Chest: Effort normal and breath sounds normal. No respiratory distress. He has no wheezes. He has no rales. He exhibits no tenderness.  Abdominal: Soft. Bowel sounds are normal. He exhibits no distension and no mass. There is no tenderness.  Musculoskeletal: Normal range of motion. He exhibits no tenderness. Edema: +1 on left LE.  Right AKA Small ganglion cyst to right wrist  Lymphadenopathy:    He has no cervical adenopathy.  Neurological: He is alert and oriented to person, place, and time.  He has normal reflexes. No cranial nerve deficit. Coordination normal.  Pain in right shoulder secondary to C5 radiculopathy  Skin: Skin is warm and dry. No rash noted. He is not diaphoretic. No erythema. No pallor.  Psychiatric: He has a normal mood and affect. His behavior is normal. Judgment and thought content normal.     Labs reviewed: Lab Summary Latest Ref Rng 05/21/2015 05/16/2015 03/18/2015 01/09/2015  Hemoglobin 13.0 - 17.0 g/dL 13.7 15.4 (None) 14.8  Hematocrit 39.0 - 52.0 % 41.9 45.3 (None) 43.1  White count 4.0 - 10.5 K/uL 8.9 9.0 (None) 7.1  Platelet count 150 - 400 K/uL 209 270 (None) 221  Sodium 135 - 145 mmol/L 138 141 138 138  Potassium 3.5 - 5.1 mmol/L 4.0 4.1 4.2 4.4  Calcium 8.9 - 10.3 mg/dL 9.3 9.8 9.5 9.6  Phosphorus - (None) (None) (None) (None)  Creatinine 0.61 - 1.24 mg/dL 1.06 1.05 0.80 0.98  AST 15 - 41 U/L (None) 26 (None) 24  Alk Phos 38 - 126 U/L (None) 92 (None) 87  Bilirubin 0.3 - 1.2 mg/dL (None) 0.7 (None) 0.3  Glucose 65 - 99 mg/dL 173(H) 89 96 157(H)  Cholesterol - (None) (None) (None) (None)  HDL cholesterol - (None) (None) (None) (None)  Triglycerides - (None) (None) (None) (None)  LDL Direct - (None) (None) (None) (None)  LDL Calc - (None) (None) (None) (None)  Total protein 6.5 - 8.1 g/dL (None) 7.0 (None) 6.7  Albumin 3.5 - 5.0 g/dL (None) 4.1 (None) 3.9   Lab Results  Component Value Date   TSH 0.856 04/29/2012   Lab Results  Component  Value Date   BUN 10 05/21/2015   BUN 10 05/16/2015   BUN 9 03/18/2015   Lab Results  Component Value Date   CREATININE 1.06 05/21/2015   CREATININE 1.05 05/16/2015   CREATININE 0.80 03/18/2015   Lab Results  Component Value Date   HGBA1C 7.1 04/29/2015   HGBA1C 8.1 04/08/2015   HGBA1C 8.4 03/18/2015       Assessment/Plan 1. Controlled type 2 DM with peripheral circulatory disorder (HCC) Discontinue Byetta and Amaryl Start Lantus 30 units daily at bedtime Reduce metformin to 500 mg every morning Do not get Humalog if CBG less than or equal to 150 mg percent   2. Essential hypertension Continue Cardizem CD 240 mg daily and lisinopril 40 mg daily Continue to monitor blood pressure

## 2015-06-06 DEATH — deceased

## 2015-07-02 ENCOUNTER — Encounter (HOSPITAL_COMMUNITY): Payer: Medicaid Other

## 2015-07-03 ENCOUNTER — Ambulatory Visit: Payer: Medicaid Other | Admitting: Family

## 2015-07-29 ENCOUNTER — Ambulatory Visit: Payer: Medicaid Other | Admitting: Cardiovascular Disease

## 2016-03-26 IMAGING — CR DG CHEST 2V
2 series · 2 of 2 positions shown · non-contrast
Comparison: 08/04/2012.

CLINICAL DATA: Preoperative evaluation for ganglion cyst removal
from the right wrist. History of diabetes and peripheral vascular
disease.

EXAM:
CHEST  2 VIEW

[w chest lat]
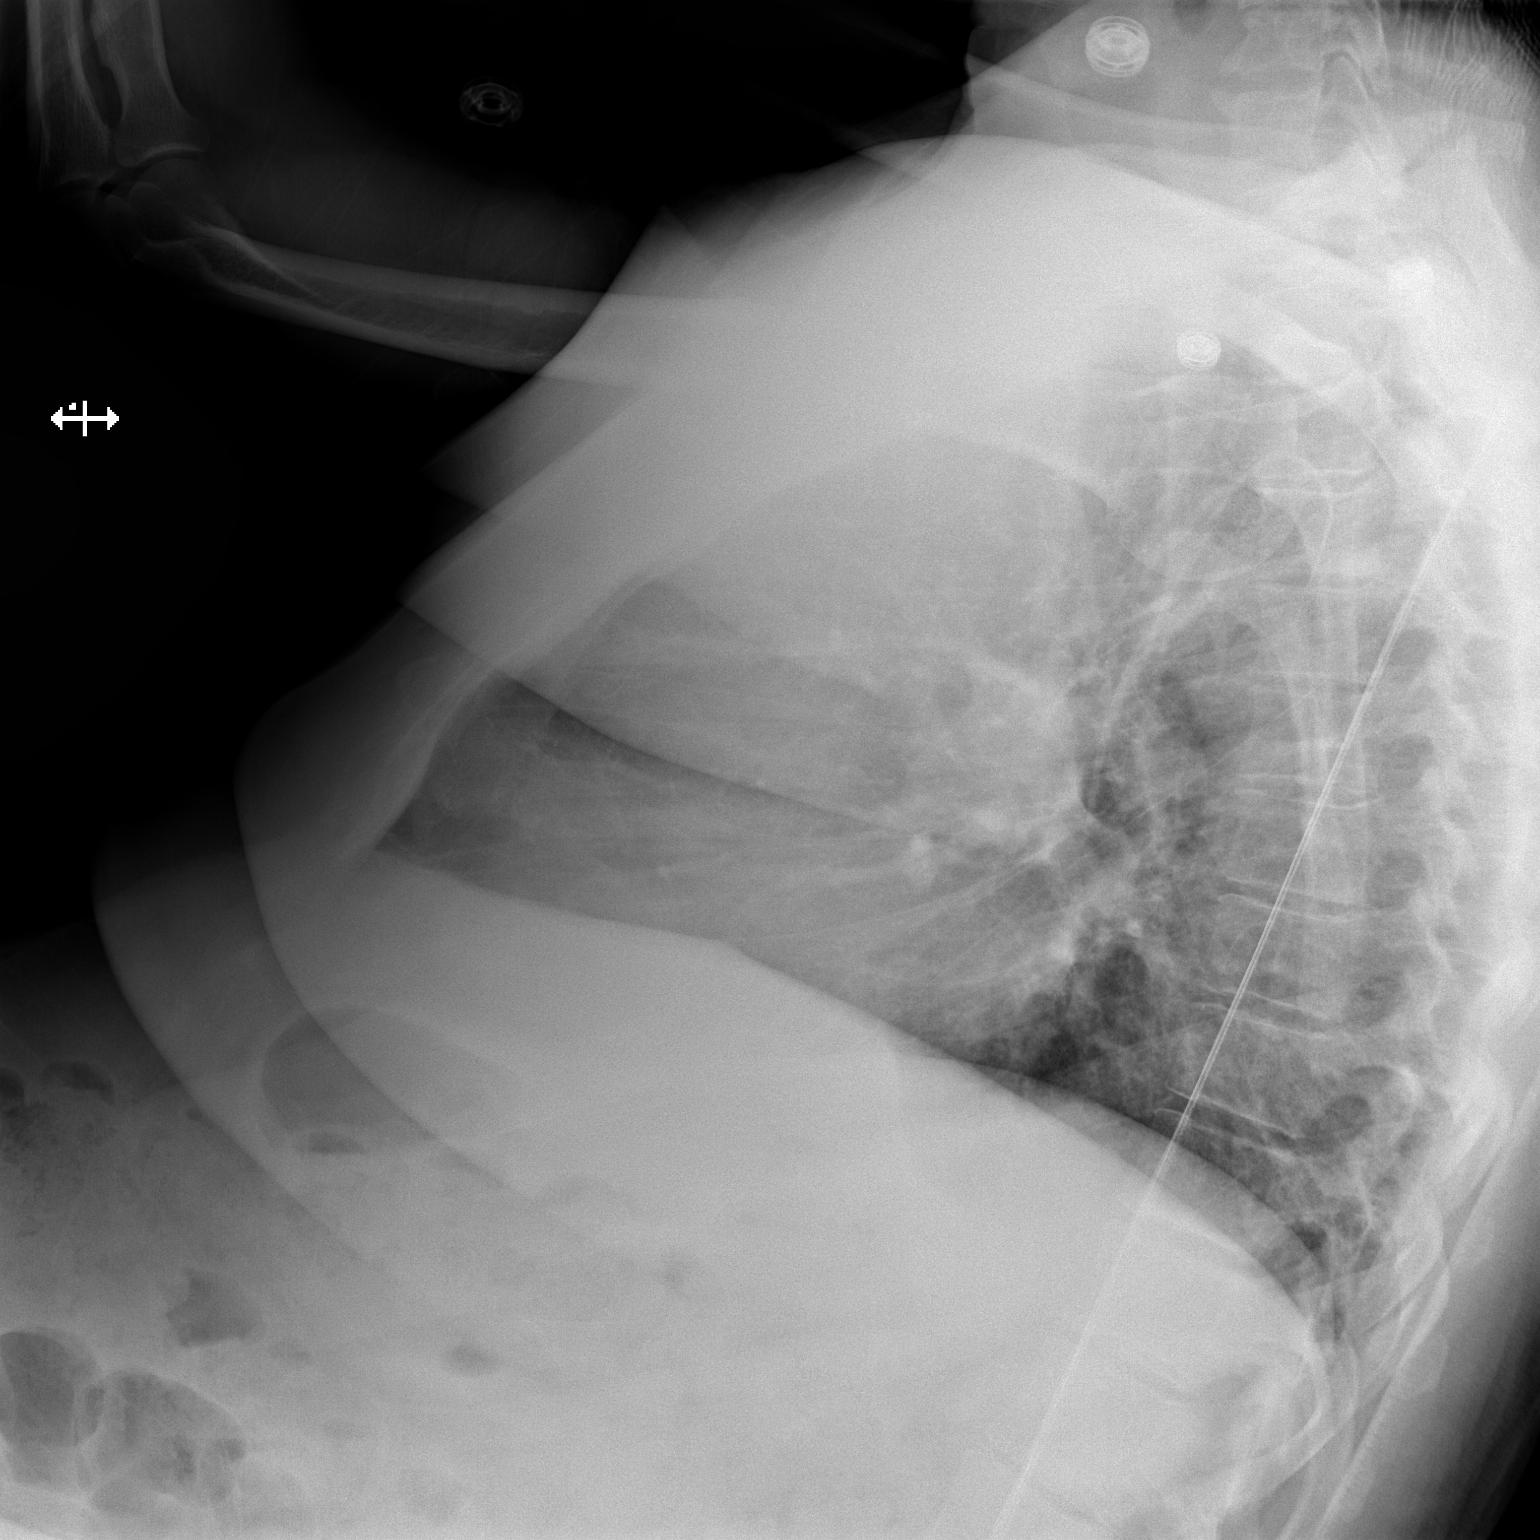

[x chest ap]
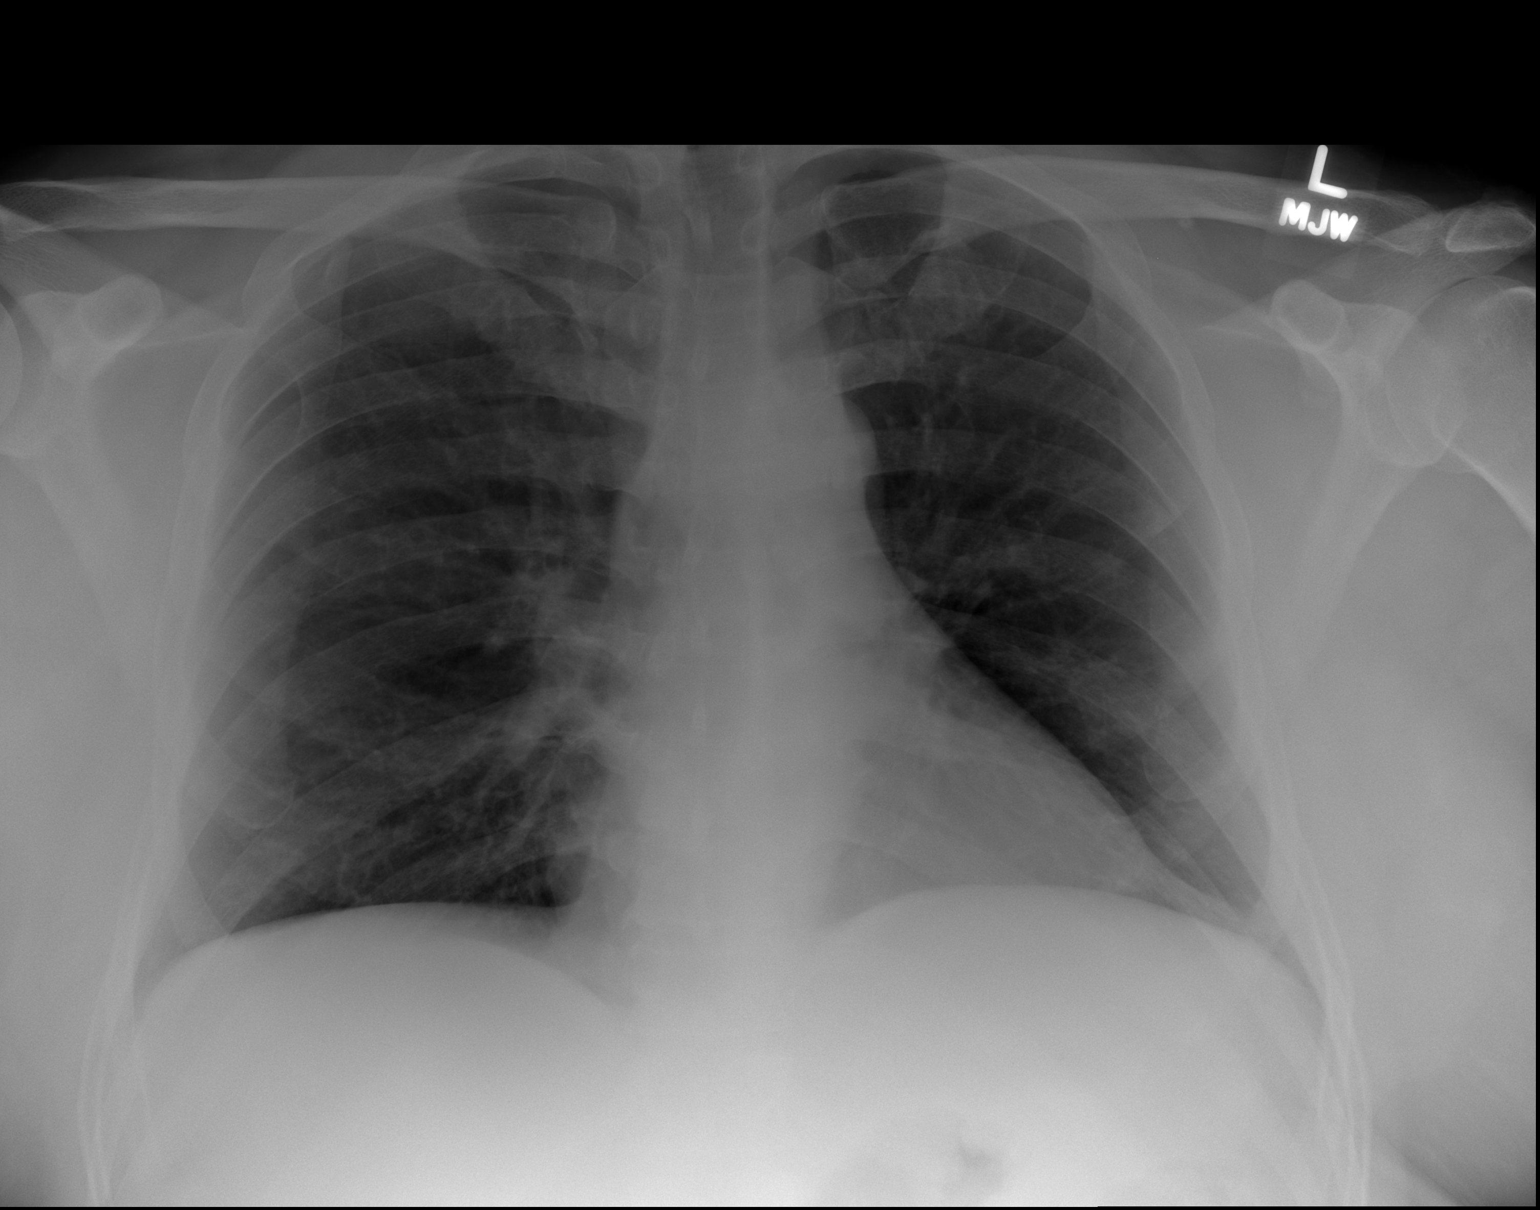

[2 of 2 positions shown; findings below may reference images not displayed]

FINDINGS: There is lordotic positioning on the AP view and suboptimal
inspiration. The heart size and mediastinal contours are normal. The
lungs are clear. There is no pleural effusion or pneumothorax. No
acute osseous findings are identified.
IMPRESSION: Suboptimal inspiration.  No active cardiopulmonary process.

## 2017-05-14 IMAGING — DX DG CHEST 2V
2 series · 2 of 2 positions shown · non-contrast
Comparison: 08/16/2013

CLINICAL DATA: Leg edema

EXAM:
CHEST  2 VIEW

[chest lat]
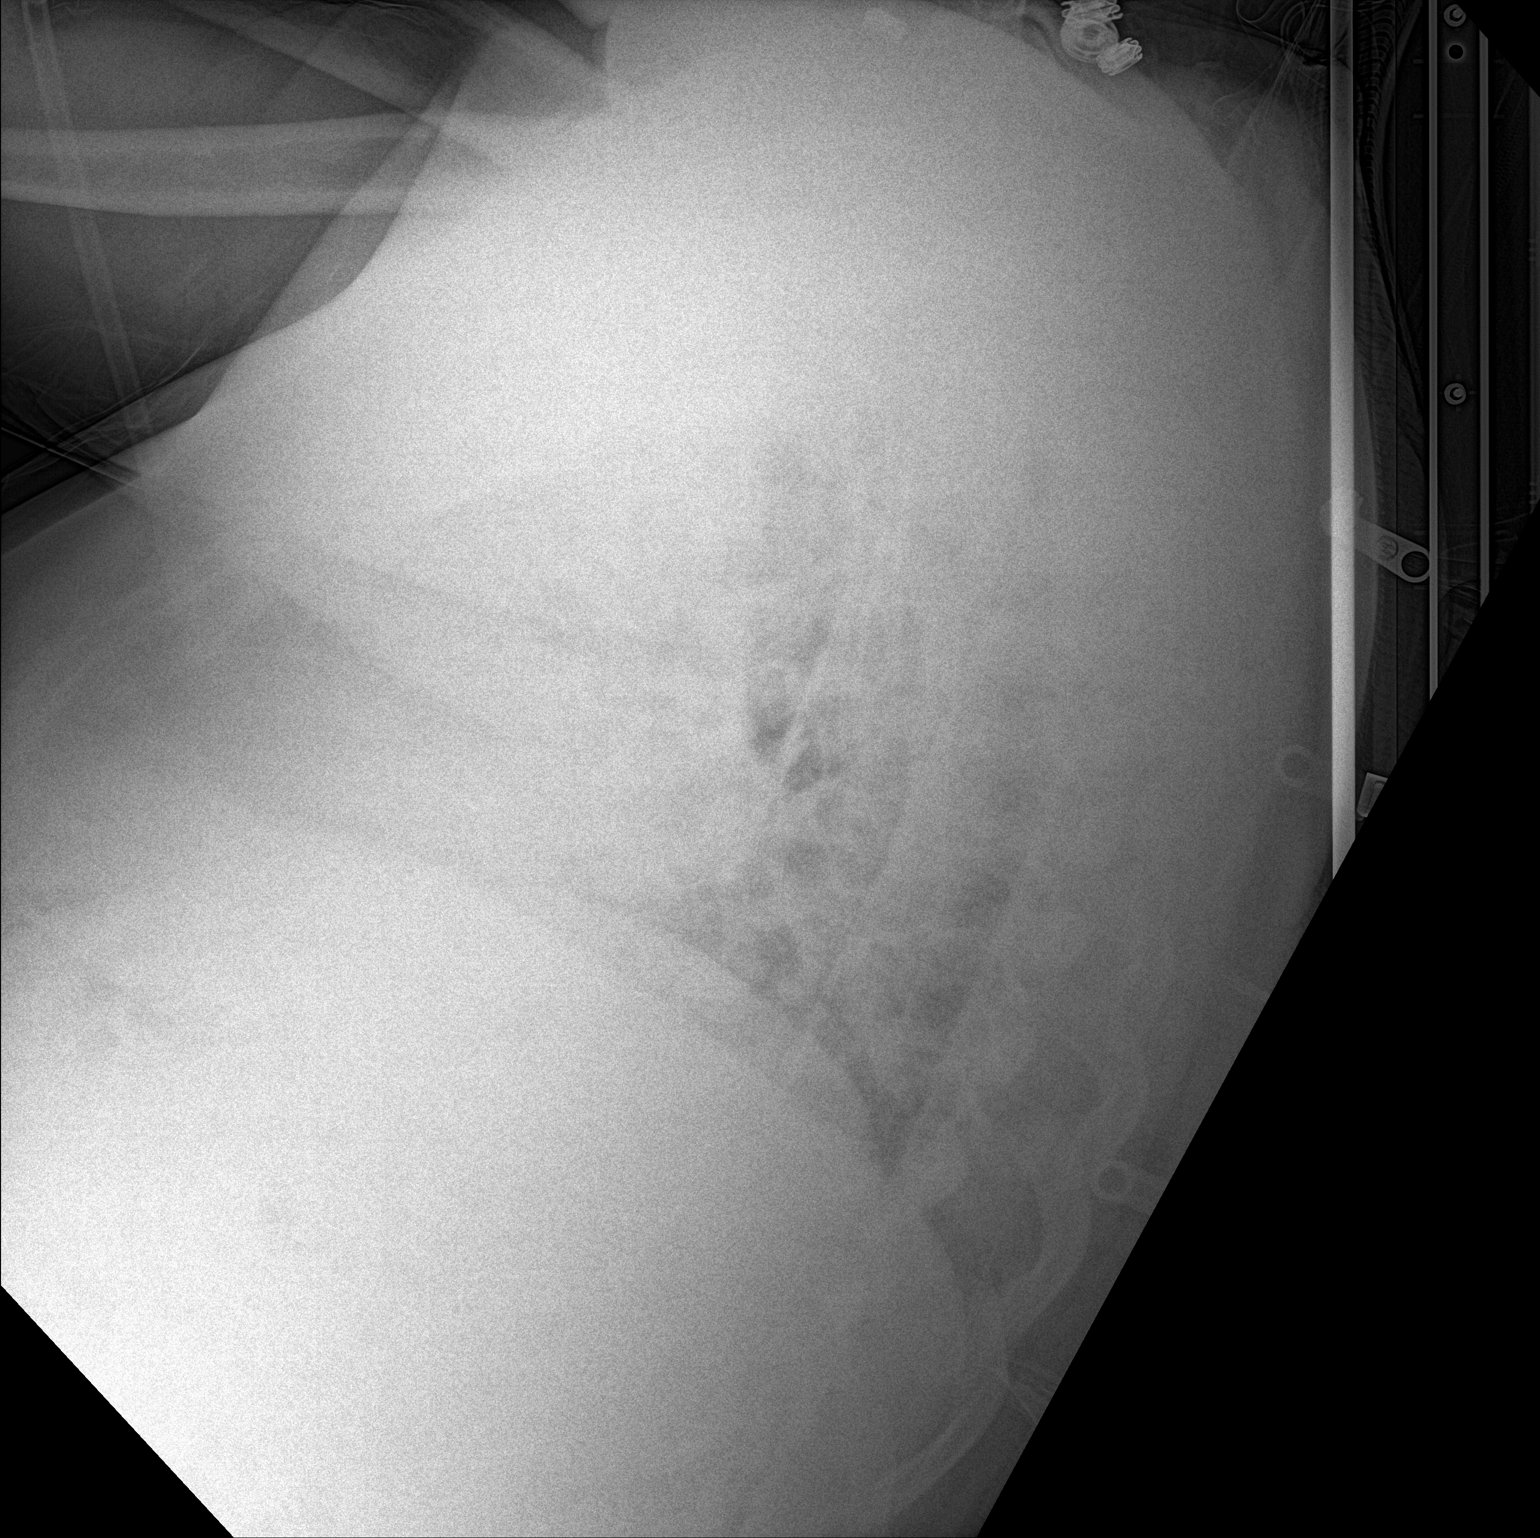

[chest ap]
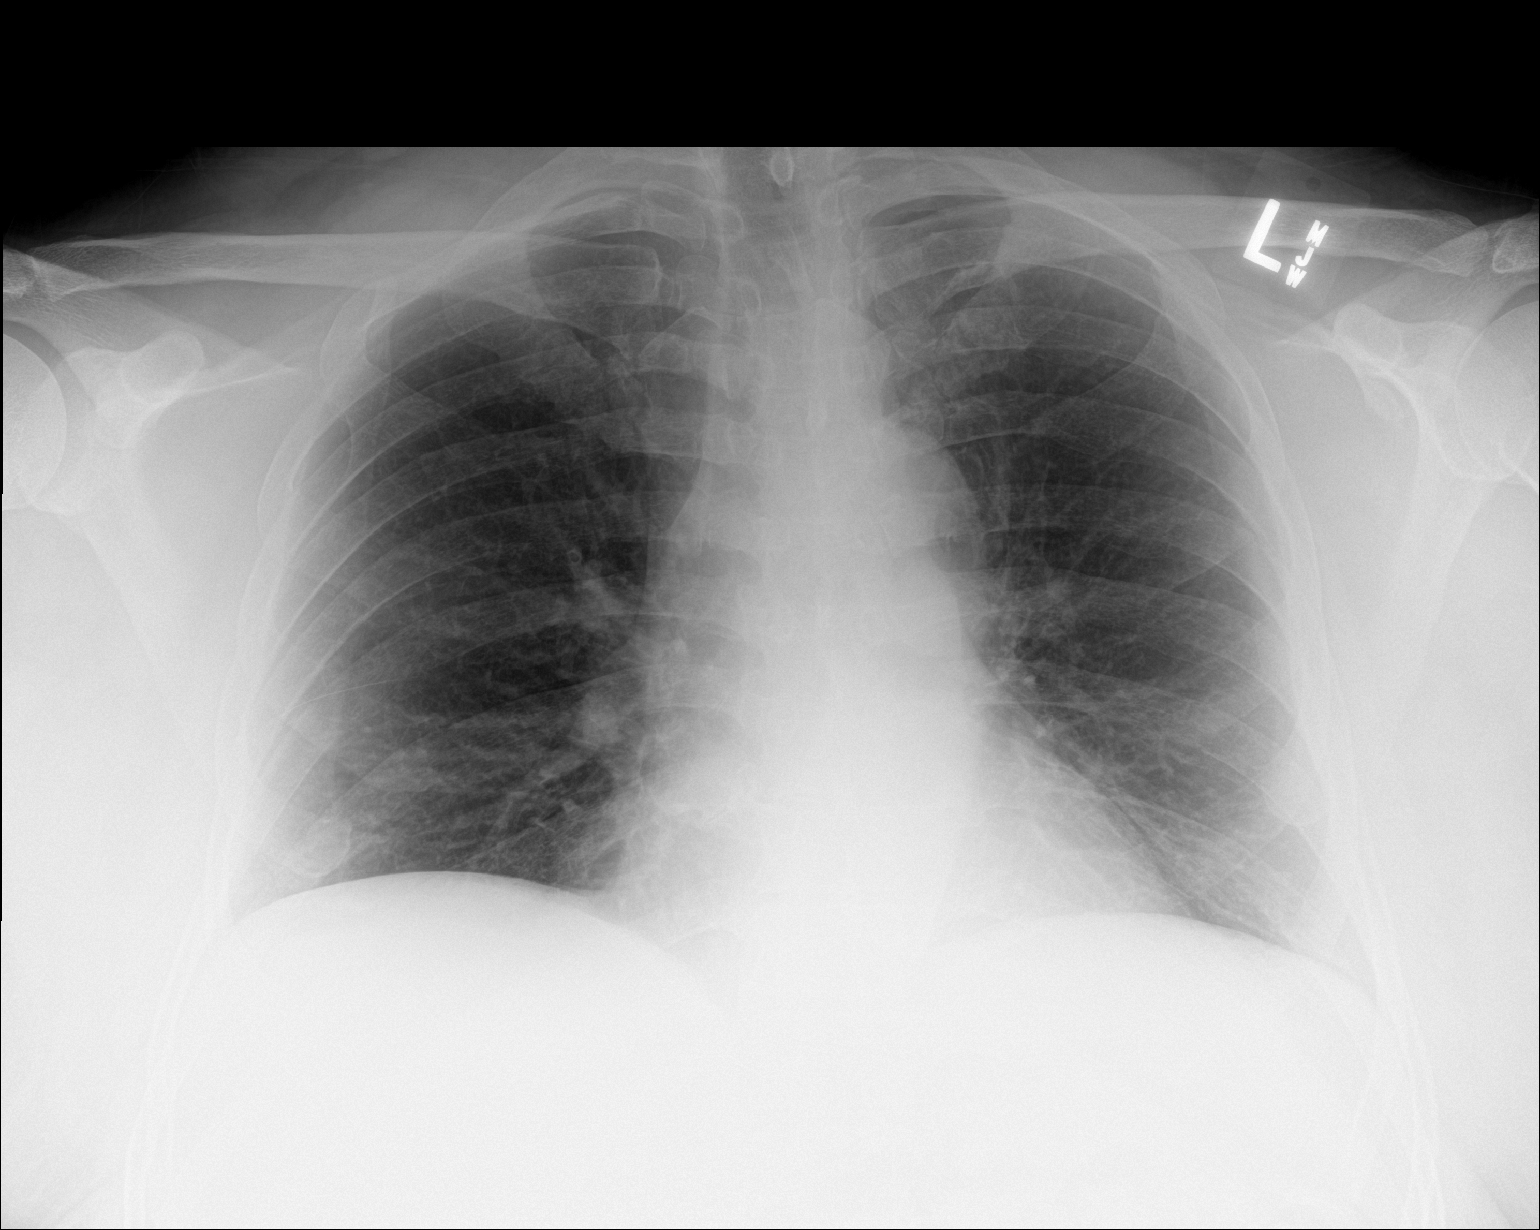

[2 of 2 positions shown; findings below may reference images not displayed]

FINDINGS: Heart size within normal limits. Negative for heart failure. Lungs
are clear without infiltrate or effusion.

The lateral view is nondiagnostic due to underpenetration.
IMPRESSION: No active cardiopulmonary disease.
